# Patient Record
Sex: Female | Born: 1935
Health system: Southern US, Community
[De-identification: ages and names within clinical notes are randomized; demographics above are authoritative.]

## PROBLEM LIST (undated history)

## (undated) DIAGNOSIS — I10 Essential (primary) hypertension: Secondary | ICD-10-CM

## (undated) DIAGNOSIS — Z86010 Personal history of colonic polyps: Secondary | ICD-10-CM

## (undated) DIAGNOSIS — M1990 Unspecified osteoarthritis, unspecified site: Secondary | ICD-10-CM

## (undated) DIAGNOSIS — K5790 Diverticulosis of intestine, part unspecified, without perforation or abscess without bleeding: Secondary | ICD-10-CM

## (undated) DIAGNOSIS — Z8719 Personal history of other diseases of the digestive system: Secondary | ICD-10-CM

## (undated) DIAGNOSIS — M889 Osteitis deformans of unspecified bone: Secondary | ICD-10-CM

## (undated) DIAGNOSIS — F411 Generalized anxiety disorder: Secondary | ICD-10-CM

## (undated) DIAGNOSIS — C50411 Malignant neoplasm of upper-outer quadrant of right female breast: Secondary | ICD-10-CM

## (undated) DIAGNOSIS — R635 Abnormal weight gain: Secondary | ICD-10-CM

## (undated) HISTORY — PX: TOTAL HIP ARTHROPLASTY: SHX124

## (undated) HISTORY — DX: Osteitis deformans of unspecified bone: M88.9

## (undated) HISTORY — DX: Malignant neoplasm of upper-outer quadrant of right female breast: C50.411

## (undated) HISTORY — DX: Personal history of colonic polyps: Z86.010

## (undated) HISTORY — DX: Unspecified osteoarthritis, unspecified site: M19.90

## (undated) HISTORY — PX: TONSILLECTOMY: SHX5217

## (undated) HISTORY — DX: Essential (primary) hypertension: I10

## (undated) HISTORY — PX: TUBAL LIGATION: SHX77

## (undated) HISTORY — DX: Diverticulosis of intestine, part unspecified, without perforation or abscess without bleeding: K57.90

## (undated) HISTORY — PX: APPENDECTOMY: SHX54

## (undated) HISTORY — DX: Personal history of other diseases of the digestive system: Z87.19

## (undated) HISTORY — DX: Generalized anxiety disorder: F41.1

## (undated) HISTORY — DX: Abnormal weight gain: R63.5

---

## 1998-01-18 ENCOUNTER — Encounter: Payer: Self-pay | Admitting: *Deleted

## 1998-01-20 ENCOUNTER — Inpatient Hospital Stay (HOSPITAL_COMMUNITY): Admission: RE | Admit: 1998-01-20 | Discharge: 1998-01-26 | Payer: Self-pay | Admitting: *Deleted

## 1998-07-05 ENCOUNTER — Other Ambulatory Visit: Admission: RE | Admit: 1998-07-05 | Discharge: 1998-07-05 | Payer: Self-pay | Admitting: Obstetrics & Gynecology

## 1999-08-11 ENCOUNTER — Other Ambulatory Visit: Admission: RE | Admit: 1999-08-11 | Discharge: 1999-08-11 | Payer: Self-pay | Admitting: Internal Medicine

## 2000-10-24 ENCOUNTER — Other Ambulatory Visit: Admission: RE | Admit: 2000-10-24 | Discharge: 2000-10-24 | Payer: Self-pay | Admitting: Internal Medicine

## 2001-11-21 ENCOUNTER — Other Ambulatory Visit: Admission: RE | Admit: 2001-11-21 | Discharge: 2001-11-21 | Payer: Self-pay | Admitting: Internal Medicine

## 2002-09-10 ENCOUNTER — Encounter: Payer: Self-pay | Admitting: *Deleted

## 2002-09-10 ENCOUNTER — Encounter: Admission: RE | Admit: 2002-09-10 | Discharge: 2002-09-10 | Payer: Self-pay | Admitting: *Deleted

## 2002-11-23 ENCOUNTER — Other Ambulatory Visit: Admission: RE | Admit: 2002-11-23 | Discharge: 2002-11-23 | Payer: Self-pay | Admitting: Internal Medicine

## 2003-04-13 ENCOUNTER — Encounter: Admission: RE | Admit: 2003-04-13 | Discharge: 2003-04-13 | Payer: Self-pay | Admitting: *Deleted

## 2003-09-02 ENCOUNTER — Encounter: Admission: RE | Admit: 2003-09-02 | Discharge: 2003-09-02 | Payer: Self-pay | Admitting: Orthopedic Surgery

## 2004-02-04 ENCOUNTER — Encounter: Admission: RE | Admit: 2004-02-04 | Discharge: 2004-02-04 | Payer: Self-pay | Admitting: Orthopedic Surgery

## 2004-05-01 ENCOUNTER — Ambulatory Visit: Payer: Self-pay | Admitting: Internal Medicine

## 2004-06-01 ENCOUNTER — Encounter: Admission: RE | Admit: 2004-06-01 | Discharge: 2004-06-01 | Payer: Self-pay | Admitting: Orthopedic Surgery

## 2004-07-03 ENCOUNTER — Ambulatory Visit: Payer: Self-pay | Admitting: Internal Medicine

## 2004-08-15 ENCOUNTER — Inpatient Hospital Stay (HOSPITAL_COMMUNITY): Admission: RE | Admit: 2004-08-15 | Discharge: 2004-08-18 | Payer: Self-pay | Admitting: Orthopedic Surgery

## 2004-08-18 ENCOUNTER — Ambulatory Visit: Payer: Self-pay | Admitting: Physical Medicine & Rehabilitation

## 2004-08-18 ENCOUNTER — Inpatient Hospital Stay
Admission: RE | Admit: 2004-08-18 | Discharge: 2004-08-25 | Payer: Self-pay | Admitting: Physical Medicine & Rehabilitation

## 2004-09-25 ENCOUNTER — Ambulatory Visit: Payer: Self-pay | Admitting: Internal Medicine

## 2004-10-04 ENCOUNTER — Ambulatory Visit: Payer: Self-pay | Admitting: Internal Medicine

## 2005-03-26 ENCOUNTER — Other Ambulatory Visit: Admission: RE | Admit: 2005-03-26 | Discharge: 2005-03-26 | Payer: Self-pay | Admitting: Internal Medicine

## 2005-03-26 ENCOUNTER — Encounter: Payer: Self-pay | Admitting: Internal Medicine

## 2005-03-26 ENCOUNTER — Ambulatory Visit: Payer: Self-pay | Admitting: Internal Medicine

## 2005-09-26 ENCOUNTER — Ambulatory Visit: Payer: Self-pay | Admitting: Internal Medicine

## 2005-10-09 ENCOUNTER — Ambulatory Visit: Payer: Self-pay | Admitting: Internal Medicine

## 2005-10-17 ENCOUNTER — Encounter (INDEPENDENT_AMBULATORY_CARE_PROVIDER_SITE_OTHER): Payer: Self-pay | Admitting: Specialist

## 2005-10-17 ENCOUNTER — Encounter: Payer: Self-pay | Admitting: Internal Medicine

## 2005-10-17 ENCOUNTER — Ambulatory Visit: Payer: Self-pay | Admitting: Internal Medicine

## 2006-03-25 ENCOUNTER — Encounter: Admission: RE | Admit: 2006-03-25 | Discharge: 2006-03-25 | Payer: Self-pay | Admitting: Orthopedic Surgery

## 2006-04-01 ENCOUNTER — Encounter: Payer: Self-pay | Admitting: Internal Medicine

## 2006-04-02 ENCOUNTER — Ambulatory Visit: Payer: Self-pay | Admitting: Internal Medicine

## 2006-04-02 LAB — CONVERTED CEMR LAB
ALT: 16 units/L (ref 0–40)
Albumin: 3.7 g/dL (ref 3.5–5.2)
Alkaline Phosphatase: 151 units/L — ABNORMAL HIGH (ref 39–117)
BUN: 18 mg/dL (ref 6–23)
CO2: 32 meq/L (ref 19–32)
Calcium: 9.3 mg/dL (ref 8.4–10.5)
Chol/HDL Ratio, serum: 3.4
Cholesterol: 198 mg/dL (ref 0–200)
GFR calc non Af Amer: 87 mL/min
Glomerular Filtration Rate, Af Am: 105 mL/min/{1.73_m2}
HCT: 47.2 % — ABNORMAL HIGH (ref 36.0–46.0)
LDL Cholesterol: 118 mg/dL — ABNORMAL HIGH (ref 0–99)
Platelets: 266 10*3/uL (ref 150–400)
RDW: 12.6 % (ref 11.5–14.6)
Total Protein: 6.7 g/dL (ref 6.0–8.3)
Triglyceride fasting, serum: 109 mg/dL (ref 0–149)

## 2006-06-05 ENCOUNTER — Ambulatory Visit: Payer: Self-pay | Admitting: Internal Medicine

## 2006-10-03 ENCOUNTER — Ambulatory Visit: Payer: Self-pay | Admitting: Internal Medicine

## 2006-10-21 ENCOUNTER — Encounter: Payer: Self-pay | Admitting: Internal Medicine

## 2007-04-02 DIAGNOSIS — I10 Essential (primary) hypertension: Secondary | ICD-10-CM

## 2007-04-02 DIAGNOSIS — Z860100 Personal history of colon polyps, unspecified: Secondary | ICD-10-CM

## 2007-04-02 DIAGNOSIS — Z8719 Personal history of other diseases of the digestive system: Secondary | ICD-10-CM | POA: Insufficient documentation

## 2007-04-02 DIAGNOSIS — K5791 Diverticulosis of intestine, part unspecified, without perforation or abscess with bleeding: Secondary | ICD-10-CM | POA: Insufficient documentation

## 2007-04-02 HISTORY — DX: Personal history of other diseases of the digestive system: Z87.19

## 2007-04-02 HISTORY — DX: Essential (primary) hypertension: I10

## 2007-04-02 HISTORY — DX: Personal history of colon polyps, unspecified: Z86.0100

## 2007-04-02 HISTORY — DX: Personal history of colonic polyps: Z86.010

## 2007-04-07 ENCOUNTER — Ambulatory Visit: Payer: Self-pay | Admitting: Internal Medicine

## 2007-04-07 DIAGNOSIS — F411 Generalized anxiety disorder: Secondary | ICD-10-CM

## 2007-04-07 DIAGNOSIS — F419 Anxiety disorder, unspecified: Secondary | ICD-10-CM | POA: Insufficient documentation

## 2007-04-07 DIAGNOSIS — M1990 Unspecified osteoarthritis, unspecified site: Secondary | ICD-10-CM

## 2007-04-07 HISTORY — DX: Generalized anxiety disorder: F41.1

## 2007-04-07 HISTORY — DX: Unspecified osteoarthritis, unspecified site: M19.90

## 2007-04-07 LAB — CONVERTED CEMR LAB
ALT: 21 units/L (ref 0–35)
AST: 23 units/L (ref 0–37)
Alkaline Phosphatase: 146 units/L — ABNORMAL HIGH (ref 39–117)
BUN: 18 mg/dL (ref 6–23)
Basophils Relative: 0.6 % (ref 0.0–1.0)
Bilirubin, Direct: 0.1 mg/dL (ref 0.0–0.3)
CO2: 32 meq/L (ref 19–32)
Calcium: 10 mg/dL (ref 8.4–10.5)
Chloride: 105 meq/L (ref 96–112)
Cholesterol: 204 mg/dL (ref 0–200)
Eosinophils Relative: 0.8 % (ref 0.0–5.0)
GFR calc Af Amer: 105 mL/min
GFR calc non Af Amer: 87 mL/min
Glucose, Bld: 105 mg/dL — ABNORMAL HIGH (ref 70–99)
HDL: 42.9 mg/dL (ref >39.0)
Monocytes Relative: 9.8 % (ref 3.0–11.0)
Platelets: 180 10*3/uL (ref 150–400)
RBC: 5.43 M/uL — ABNORMAL HIGH (ref 3.87–5.11)
Total CHOL/HDL Ratio: 4.8
Total Protein: 7.2 g/dL (ref 6.0–8.3)
Triglycerides: 142 mg/dL (ref 0–149)
VLDL: 28 mg/dL (ref 0–40)
WBC: 8.8 10*3/uL (ref 4.5–10.5)

## 2007-04-09 ENCOUNTER — Ambulatory Visit: Payer: Self-pay | Admitting: Cardiology

## 2007-05-06 ENCOUNTER — Ambulatory Visit: Payer: Self-pay | Admitting: Internal Medicine

## 2007-07-22 ENCOUNTER — Ambulatory Visit: Payer: Self-pay | Admitting: Internal Medicine

## 2007-07-22 DIAGNOSIS — J069 Acute upper respiratory infection, unspecified: Secondary | ICD-10-CM | POA: Insufficient documentation

## 2007-11-13 ENCOUNTER — Ambulatory Visit: Payer: Self-pay | Admitting: Internal Medicine

## 2007-11-13 DIAGNOSIS — R635 Abnormal weight gain: Secondary | ICD-10-CM | POA: Insufficient documentation

## 2007-11-13 HISTORY — DX: Abnormal weight gain: R63.5

## 2008-01-05 ENCOUNTER — Ambulatory Visit: Payer: Self-pay | Admitting: Internal Medicine

## 2008-01-05 DIAGNOSIS — J189 Pneumonia, unspecified organism: Secondary | ICD-10-CM | POA: Insufficient documentation

## 2008-02-25 ENCOUNTER — Ambulatory Visit: Payer: Self-pay | Admitting: Internal Medicine

## 2008-04-23 ENCOUNTER — Telehealth: Payer: Self-pay | Admitting: Internal Medicine

## 2008-05-04 ENCOUNTER — Encounter: Payer: Self-pay | Admitting: Internal Medicine

## 2008-05-05 ENCOUNTER — Ambulatory Visit: Payer: Self-pay | Admitting: Internal Medicine

## 2008-05-05 LAB — CONVERTED CEMR LAB
ALT: 24 units/L (ref 0–35)
AST: 25 units/L (ref 0–37)
Albumin: 3.8 g/dL (ref 3.5–5.2)
Alkaline Phosphatase: 141 units/L — ABNORMAL HIGH (ref 39–117)
BUN: 15 mg/dL (ref 6–23)
Bilirubin, Direct: 0.1 mg/dL (ref 0.0–0.3)
CO2: 33 meq/L — ABNORMAL HIGH (ref 19–32)
Chloride: 108 meq/L (ref 96–112)
Cholesterol: 219 mg/dL (ref 0–200)
Eosinophils Relative: 0.7 % (ref 0.0–5.0)
Glucose, Bld: 86 mg/dL (ref 70–99)
HCT: 44.7 % (ref 36.0–46.0)
Lymphocytes Relative: 30.1 % (ref 12.0–46.0)
Monocytes Relative: 5.7 % (ref 3.0–12.0)
Platelets: 253 10*3/uL (ref 150–400)
Potassium: 5.1 meq/L (ref 3.5–5.1)
RDW: 12.8 % (ref 11.5–14.6)
Sodium: 146 meq/L — ABNORMAL HIGH (ref 135–145)
Total CHOL/HDL Ratio: 4.5
Total Protein: 6.9 g/dL (ref 6.0–8.3)
Triglycerides: 124 mg/dL (ref 0–149)
VLDL: 25 mg/dL (ref 0–40)
WBC: 10.6 10*3/uL — ABNORMAL HIGH (ref 4.5–10.5)

## 2008-09-29 ENCOUNTER — Encounter (INDEPENDENT_AMBULATORY_CARE_PROVIDER_SITE_OTHER): Payer: Self-pay | Admitting: *Deleted

## 2008-10-13 ENCOUNTER — Ambulatory Visit: Payer: Self-pay | Admitting: Internal Medicine

## 2008-10-28 ENCOUNTER — Ambulatory Visit: Payer: Self-pay | Admitting: Internal Medicine

## 2008-10-28 ENCOUNTER — Encounter: Payer: Self-pay | Admitting: Internal Medicine

## 2008-10-30 ENCOUNTER — Encounter: Payer: Self-pay | Admitting: Internal Medicine

## 2008-11-03 ENCOUNTER — Ambulatory Visit: Payer: Self-pay | Admitting: Internal Medicine

## 2008-11-22 ENCOUNTER — Encounter: Payer: Self-pay | Admitting: Internal Medicine

## 2009-05-05 ENCOUNTER — Ambulatory Visit: Payer: Self-pay | Admitting: Internal Medicine

## 2009-05-05 LAB — CONVERTED CEMR LAB
ALT: 19 units/L (ref 0–35)
Albumin: 4 g/dL (ref 3.5–5.2)
Alkaline Phosphatase: 139 units/L — ABNORMAL HIGH (ref 39–117)
Basophils Relative: 0.4 % (ref 0.0–3.0)
Bilirubin, Direct: 0.1 mg/dL (ref 0.0–0.3)
Chloride: 107 meq/L (ref 96–112)
Cholesterol: 223 mg/dL — ABNORMAL HIGH (ref 0–200)
Creatinine, Ser: 0.7 mg/dL (ref 0.4–1.2)
Eosinophils Relative: 1.5 % (ref 0.0–5.0)
Hemoglobin: 15.5 g/dL — ABNORMAL HIGH (ref 12.0–15.0)
Lymphocytes Relative: 35.6 % (ref 12.0–46.0)
MCV: 93.9 fL (ref 78.0–100.0)
Monocytes Absolute: 0.7 10*3/uL (ref 0.1–1.0)
Neutro Abs: 5.1 10*3/uL (ref 1.4–7.7)
Neutrophils Relative %: 54.8 % (ref 43.0–77.0)
RBC: 4.9 M/uL (ref 3.87–5.11)
Sodium: 146 meq/L — ABNORMAL HIGH (ref 135–145)
Total CHOL/HDL Ratio: 5
Total Protein: 7.2 g/dL (ref 6.0–8.3)
VLDL: 28.8 mg/dL (ref 0.0–40.0)
WBC: 9.2 10*3/uL (ref 4.5–10.5)

## 2009-08-16 ENCOUNTER — Ambulatory Visit: Payer: Self-pay | Admitting: Internal Medicine

## 2009-11-03 ENCOUNTER — Ambulatory Visit: Payer: Self-pay | Admitting: Internal Medicine

## 2009-11-23 ENCOUNTER — Encounter: Payer: Self-pay | Admitting: Internal Medicine

## 2010-02-08 ENCOUNTER — Ambulatory Visit: Payer: Self-pay | Admitting: Family Medicine

## 2010-05-18 ENCOUNTER — Ambulatory Visit: Payer: Self-pay | Admitting: Internal Medicine

## 2010-06-07 ENCOUNTER — Encounter: Payer: Self-pay | Admitting: Internal Medicine

## 2010-06-07 ENCOUNTER — Ambulatory Visit
Admission: RE | Admit: 2010-06-07 | Discharge: 2010-06-07 | Payer: Self-pay | Source: Home / Self Care | Attending: Internal Medicine | Admitting: Internal Medicine

## 2010-06-07 ENCOUNTER — Other Ambulatory Visit: Payer: Self-pay | Admitting: Internal Medicine

## 2010-06-07 LAB — CBC WITH DIFFERENTIAL/PLATELET
Basophils Absolute: 0 10*3/uL (ref 0.0–0.1)
Basophils Relative: 0.2 % (ref 0.0–3.0)
Eosinophils Absolute: 0 10*3/uL (ref 0.0–0.7)
Eosinophils Relative: 0.5 % (ref 0.0–5.0)
HCT: 45 % (ref 36.0–46.0)
Hemoglobin: 15.4 g/dL — ABNORMAL HIGH (ref 12.0–15.0)
Lymphocytes Relative: 32.1 % (ref 12.0–46.0)
Lymphs Abs: 3.2 10*3/uL (ref 0.7–4.0)
MCHC: 34.1 g/dL (ref 30.0–36.0)
MCV: 92.2 fl (ref 78.0–100.0)
Monocytes Absolute: 0.7 10*3/uL (ref 0.1–1.0)
Monocytes Relative: 6.8 % (ref 3.0–12.0)
Neutro Abs: 6 10*3/uL (ref 1.4–7.7)
Neutrophils Relative %: 60.4 % (ref 43.0–77.0)
Platelets: 253 10*3/uL (ref 150.0–400.0)
RBC: 4.89 Mil/uL (ref 3.87–5.11)
RDW: 13.6 % (ref 11.5–14.6)
WBC: 10 10*3/uL (ref 4.5–10.5)

## 2010-06-07 LAB — BASIC METABOLIC PANEL
BUN: 17 mg/dL (ref 6–23)
CO2: 31 mEq/L (ref 19–32)
Calcium: 9.5 mg/dL (ref 8.4–10.5)
Chloride: 104 mEq/L (ref 96–112)
Creatinine, Ser: 0.7 mg/dL (ref 0.4–1.2)
GFR: 91.87 mL/min (ref >60.00)
Glucose, Bld: 94 mg/dL (ref 70–99)
Potassium: 4.2 mEq/L (ref 3.5–5.1)
Sodium: 142 mEq/L (ref 135–145)

## 2010-06-07 LAB — LIPID PANEL
Cholesterol: 195 mg/dL (ref 0–200)
HDL: 42.9 mg/dL (ref >39.00)
LDL Cholesterol: 129 mg/dL — ABNORMAL HIGH (ref 0–99)
Total CHOL/HDL Ratio: 5
Triglycerides: 116 mg/dL (ref 0.0–149.0)
VLDL: 23.2 mg/dL (ref 0.0–40.0)

## 2010-06-07 LAB — HEPATIC FUNCTION PANEL
ALT: 15 U/L (ref 0–35)
AST: 20 U/L (ref 0–37)
Albumin: 3.9 g/dL (ref 3.5–5.2)
Alkaline Phosphatase: 144 U/L — ABNORMAL HIGH (ref 39–117)
Bilirubin, Direct: 0.1 mg/dL (ref 0.0–0.3)
Total Bilirubin: 0.9 mg/dL (ref 0.3–1.2)
Total Protein: 6.9 g/dL (ref 6.0–8.3)

## 2010-06-07 LAB — TSH: TSH: 1.48 u[IU]/mL (ref 0.35–5.50)

## 2010-07-04 NOTE — Assessment & Plan Note (Signed)
Summary: congestion//ccm   Vital Signs:  Patient profile:   77 year old female Weight:      163 pounds Temp:     97.9 degrees F oral BP sitting:   130 / 60  (left arm) Cuff size:   regular  Vitals Entered By: Kathrynn Speed CMA (February 08, 2010 1:47 PM) CC: dry cough, coughing up mucus, chest congestion, stopped up nose, sore throat, x 5 days, src Is Patient Diabetic? No   History of Present Illness: "Sunday visit. Patient seen with the onset of cough and some mild wheezing about 4 days ago. She's had some nasal congestion, intermittent sore throat and cough productive of yellow-green sputum. No fever. Has been treated twice previously for pneumonia. Ex-smoker. No hemoptysis and no dyspnea.  Preventive Screening-Counseling & Management  Alcohol-Tobacco     Smoking Status: quit  Current Medications (verified): 1)  Hydrochlorothiazide 25 Mg Tabs (Hydrochlorothiazide) .... 1 Once Daily 2)  Zoloft 50 Mg  Tabs (Sertraline Hcl) .... 1 Once Daily  Allergies (verified): No Known Drug Allergies  Past History:  Past Medical History: Last updated: 11/13/2007 DJD Colonic polyps, hx of Diverticulitis, hx of Hypertension Anxiety Paget's disease, lumbar spine and sacrum PMH reviewed for relevance  Review of Systems  The patient denies anorexia, fever, weight loss, chest pain, dyspnea on exertion, peripheral edema, prolonged cough, and hemoptysis.    Physical Exam  General:  Well-developed,well-nourished,in no acute distress; alert,appropriate and cooperative throughout examination Ears:  scarring of her right TM. No acute change Nose:  External nasal examination shows no deformity or inflammation. Nasal mucosa are pink and moist without lesions or exudates. Mouth:  Oral mucosa and oropharynx without lesions or exudates.  Teeth in good repair. Neck:  No deformities, masses, or tenderness noted. Lungs:  chest some very faint expiratory wheezes. No rales Heart:  normal rate and  regular rhythm.     Impression & Recommendations:  Problem # 1:  ACUTE BRONCHITIS (ICD-466.0)  Her updated medication list for this problem includes:    Azithromycin 250 Mg Tabs (Azithromycin) ..... 2 by mouth today then one by mouth once daily for 4 days  Orders: Prescription Created Electronically (G8553)  Complete Medication List: 1)  Hydrochlorothiazide 25 Mg Tabs (Hydrochlorothiazide) .... 1 once daily 2)  Zoloft 50 Mg Tabs (Sertraline hcl) .... 1 once daily 3)  Multi Vitamins Tabs (multiple Vitamins-minerals)  .... Once daily 4)  Azithromycin 250 Mg Tabs (Azithromycin) .... 2 by mouth today then one by mouth once daily for 4 days  Patient Instructions: 1)  Try Mucinex DM as needed cough. Prescriptions: AZITHROMYCIN 250 MG TABS (AZITHROMYCIN) 2 by mouth today then one by mouth once daily for 4 days  #6 x 0   Entered and Authorized by:   Bruce Burchette MD   Signed by:   Bruce Burchette MD on 02/08/2010   Method used:   [facility]        [facility]  [address] [id number]       [address]       [place], [place]  [id number]       Ph: [phone] or [phone]       Fax: [phone]   RxID:   [id number]   Appended Document: Orders Update     Clinical Lists Changes  Observations: Added new observation of FLU VAX: Historical (03/25/2010 11:27)       Immunization History:  Influenza Immunization History:  Influenza:  historical (03/25/2010)

## 2010-07-04 NOTE — Assessment & Plan Note (Signed)
Summary: chest congestion//ccm   Vital Signs:  Patient profile:   77 year old female Weight:      163 pounds O2 Sat:      94 % on Room air Temp:     98.0 degrees F oral BP sitting:   132 / 62  (left arm) Cuff size:   regular  Vitals Entered By: Duard Brady LPN (August 16, 2009 11:10 AM)  O2 Flow:  Room air CC: c/o productive cough x10 days  OTC no relief Is Patient Diabetic? No   CC:  c/o productive cough x10 days  OTC no relief.  History of Present Illness:  77 year old patient who is seen today with a 10 day history of chest congestion and cough.  For the past few days.  She has been expectorating green, thick sputum.  Denies any chest pain or shortness of breath.  Denies any fever or chills.  She does have treated hypertension.  Preventive Screening-Counseling & Management  Alcohol-Tobacco     Smoking Status: quit  Allergies (verified): No Known Drug Allergies  Past History:  Past Medical History: Reviewed history from 11/13/2007 and no changes required. DJD Colonic polyps, hx of Diverticulitis, hx of Hypertension Anxiety Paget's disease, lumbar spine and sacrum  Social History: Smoking Status:  quit  Review of Systems       The patient complains of anorexia and prolonged cough.  The patient denies fever, weight loss, weight gain, vision loss, decreased hearing, hoarseness, chest pain, syncope, dyspnea on exertion, peripheral edema, headaches, hemoptysis, abdominal pain, melena, hematochezia, severe indigestion/heartburn, hematuria, incontinence, genital sores, muscle weakness, suspicious skin lesions, transient blindness, difficulty walking, depression, unusual weight change, abnormal bleeding, enlarged lymph nodes, angioedema, and breast masses.    Physical Exam  General:  overweight-appearing.  130/64overweight-appearing.   Head:  Normocephalic and atraumatic without obvious abnormalities. No apparent alopecia or balding. Eyes:  No corneal or  conjunctival inflammation noted. EOMI. Perrla. Funduscopic exam benign, without hemorrhages, exudates or papilledema. Vision grossly normal. Ears:  External ear exam shows no significant lesions or deformities.  Otoscopic examination reveals clear canals, tympanic membranes are intact bilaterally without bulging, retraction, inflammation or discharge. Hearing is grossly normal bilaterally. Mouth:  Oral mucosa and oropharynx without lesions or exudates.  Teeth in good repair. Neck:  No deformities, masses, or tenderness noted. Lungs:  Normal respiratory effort, chest expands symmetrically. Lungs are clear to auscultation, no crackles or wheezes. O2 saturation 95 Heart:  Normal rate and regular rhythm. S1 and S2 normal without gallop, murmur, click, rub or other extra sounds. Abdomen:  Bowel sounds positive,abdomen soft and non-tender without masses, organomegaly or hernias noted. Msk:  No deformity or scoliosis noted of thoracic or lumbar spine.   Pulses:  R and L carotid,radial,femoral,dorsalis pedis and posterior tibial pulses are full and equal bilaterally Extremities:  No clubbing, cyanosis, edema, or deformity noted with normal full range of motion of all joints.     Impression & Recommendations:  Problem # 1:  BRONCHITIS-ACUTE (ICD-466.0)  Her updated medication list for this problem includes:    Doxycycline Hyclate 100 Mg Caps (Doxycycline hyclate) ..... One twice daily    Hydrocodone-homatropine 5-1.5 Mg/76ml Syrp (Hydrocodone-homatropine) .Marland Kitchen... 1 teaspoon every 6 hours as needed for cough  Her updated medication list for this problem includes:    Doxycycline Hyclate 100 Mg Caps (Doxycycline hyclate) ..... One twice daily    Hydrocodone-homatropine 5-1.5 Mg/38ml Syrp (Hydrocodone-homatropine) .Marland Kitchen... 1 teaspoon every 6 hours as needed for cough  Problem #  2:  HYPERTENSION (ICD-401.9)  Her updated medication list for this problem includes:    Hydrochlorothiazide 25 Mg Tabs  (Hydrochlorothiazide) .Marland Kitchen... 1 once daily  Her updated medication list for this problem includes:    Hydrochlorothiazide 25 Mg Tabs (Hydrochlorothiazide) .Marland Kitchen... 1 once daily  Complete Medication List: 1)  Hydrochlorothiazide 25 Mg Tabs (Hydrochlorothiazide) .Marland Kitchen.. 1 once daily 2)  Zoloft 50 Mg Tabs (Sertraline hcl) .Marland Kitchen.. 1 once daily 3)  Doxycycline Hyclate 100 Mg Caps (Doxycycline hyclate) .... One twice daily 4)  Hydrocodone-homatropine 5-1.5 Mg/53ml Syrp (Hydrocodone-homatropine) .Marland Kitchen.. 1 teaspoon every 6 hours as needed for cough  Patient Instructions: 1)  Please schedule a follow-up appointment in 3 months. 2)  Limit your Sodium (Salt). 3)  It is important that you exercise regularly at least 20 minutes 5 times a week. If you develop chest pain, have severe difficulty breathing, or feel very tired , stop exercising immediately and seek medical attention. 4)  You need to lose weight. Consider a lower calorie diet and regular exercise.  Prescriptions: HYDROCODONE-HOMATROPINE 5-1.5 MG/5ML SYRP (HYDROCODONE-HOMATROPINE) 1 teaspoon every 6 hours as needed for cough  #4 oz x 0   Entered and Authorized by:   Gordy Savers  MD   Signed by:   Gordy Savers  MD on 08/16/2009   Method used:   Printed then faxed to ...       [facility]  [address] [phone]ail[facility]       [address]       [place], [place]  [address]       Ph: [phone] or [phone]       Fax: [phone]   RxID:   [id number] DOXYCYCLINE HYCLATE 100 MG CAPS (DOXYCYCLINE HYCLATE) one twice daily  #14 x 0   Entered and Authorized by:   Gordy Savers  MD   Signed by:   Gordy Savers  MD on 08/16/2009   Method used:   Printed then faxed to ...       [facility]  [address] [phone] ([facility]ail[facility]       [address]       [place], [place]  [address]       Ph: [phone] or [phone]       Fax: [phone]   RxID:   [id number]

## 2010-07-04 NOTE — Assessment & Plan Note (Signed)
Summary: 6 month rov/njr   Vital Signs:  Patient profile:   77 year old female Weight:      159 pounds Temp:     98.4 degrees F oral BP sitting:   136 / 70  (right arm) Cuff size:   regular  Vitals Entered By: Duard Brady LPN (November 03, 1608 10:41 AM) CC: 6 mos rov - doing well - still c/o (R) knee pain from fall several mos ago. , Hypertension Management Is Patient Diabetic? No   CC:  6 mos rov - doing well - still c/o (R) knee pain from fall several mos ago.  and Hypertension Management.  History of Present Illness: 77 year old patient who has a history of hypertension, osteoarthritis.  She is seen today for 6 month follow-up.  She was seen 3 months ago for a URI.  That was somewhat stubborn, but has only improved.  No new concerns or complaints today except for some persistent right knee pain after a fall.  Hypertension History:      Positive major cardiovascular risk factors include female age 75 years old or older and hypertension.  Negative major cardiovascular risk factors include non-tobacco-user status.     Preventive Screening-Counseling & Management  Alcohol-Tobacco     Smoking Status: quit  Allergies (verified): No Known Drug Allergies  Past History:  Past Medical History: Reviewed history from 11/13/2007 and no changes required. DJD Colonic polyps, hx of Diverticulitis, hx of Hypertension Anxiety Paget's disease, lumbar spine and sacrum  Past Surgical History: Reviewed history from 11/03/2008 and no changes required. Appendectomy Total hip replacement left 1989, with revision in 1999; status post right total hip replacement surgery in March 2006 Tubal ligation Tonsillectomy  colonoscopy May 2007, May 2010  Review of Systems  The patient denies anorexia, fever, weight loss, weight gain, vision loss, decreased hearing, hoarseness, chest pain, syncope, dyspnea on exertion, peripheral edema, prolonged cough, headaches, hemoptysis, abdominal pain,  melena, hematochezia, severe indigestion/heartburn, hematuria, incontinence, genital sores, muscle weakness, suspicious skin lesions, transient blindness, difficulty walking, depression, unusual weight change, abnormal bleeding, enlarged lymph nodes, angioedema, and breast masses.    Physical Exam  General:  Well-developed,well-nourished,in no acute distress; alert,appropriate and cooperative throughout examination Head:  Normocephalic and atraumatic without obvious abnormalities. No apparent alopecia or balding. Eyes:  No corneal or conjunctival inflammation noted. EOMI. Perrla. Funduscopic exam benign, without hemorrhages, exudates or papilledema. Vision grossly normal. Mouth:  Oral mucosa and oropharynx without lesions or exudates.  Teeth in good repair. Neck:  No deformities, masses, or tenderness noted. Lungs:  Normal respiratory effort, chest expands symmetrically. Lungs are clear to auscultation, no crackles or wheezes. Heart:  Normal rate and regular rhythm. S1 and S2 normal without gallop, murmur, click, rub or other extra sounds. Abdomen:  Bowel sounds positive,abdomen soft and non-tender without masses, organomegaly or hernias noted. Msk:  No deformity or scoliosis noted of thoracic or lumbar spine.   Pulses:  R and L carotid,radial,femoral,dorsalis pedis and posterior tibial pulses are full and equal bilaterally Extremities:  No clubbing, cyanosis, edema, or deformity noted with normal full range of motion of all joints.     Impression & Recommendations:  Problem # 1:  OSTEOARTHRITIS, SEVERE (ICD-715.90)  Problem # 2:  HYPERTENSION (ICD-401.9)  Her updated medication list for this problem includes:    Hydrochlorothiazide 25 Mg Tabs (Hydrochlorothiazide) .Marland Kitchen... 1 once daily  Complete Medication List: 1)  Hydrochlorothiazide 25 Mg Tabs (Hydrochlorothiazide) .Marland Kitchen.. 1 once daily 2)  Zoloft 50 Mg Tabs (  Sertraline hcl) .Marland Kitchen.. 1 once daily  Hypertension Assessment/Plan:      The  patient's hypertensive risk group is category B: At least one risk factor (excluding diabetes) with no target organ damage.  Today's blood pressure is 136/70.    Patient Instructions: 1)  Please schedule a follow-up appointment in 6 months. 2)  Advised not to eat any food or drink any liquids after 10 PM the night before your procedure. 3)  Limit your Sodium (Salt) to less than 2 grams a day(slightly less than 1/2 a teaspoon) to prevent fluid retention, swelling, or worsening of symptoms. 4)  It is important that you exercise regularly at least 20 minutes 5 times a week. If you develop chest pain, have severe difficulty breathing, or feel very tired , stop exercising immediately and seek medical attention.

## 2010-07-06 NOTE — Assessment & Plan Note (Signed)
Summary: cpx /pt will be fasting/mm   Vital Signs:  Patient profile:   77 year old female Height:      63.5 inches Weight:      159 pounds BMI:     27.82 Temp:     98.3 degrees F oral BP sitting:   110 / 74  (left arm) Cuff size:   regular  Vitals Entered By: Duard Brady LPN (June 07, 2010 8:40 AM) CC: cpx - doing well Is Patient Diabetic? No   CC:  cpx - doing well.  History of Present Illness: 33 -year-old patient who is seen today for a preventive health examination.  Medical problems include treated hypertension, as well as osteoarthritis.  She is followed closely by GI to do a personal history of colonic polyps and a family history of colon cancer.  She is doing well today.  Here for Medicare AWV:  1.   Risk factors based on Past M, S, F history:  cardiac risk factors include hypertension only 2.   Physical Activities: remains fairly active, although limited somewhat by osteoarthritis 3.   Depression/mood-  history of mild depression, which has been quite stable on Zoloft 4.   Hearing: no hearing deficits 5.   ADL's: independent in all aspects of daily living 6.   Fall Risk: low 7.   Home Safety: no problems identified 8.   Height, weight, &visual acuity:height and weight stable.  No change in visual acuity 9.   Counseling: heart healthy diet regular.  Exercise encouraged 10.   Labs ordered based on risk factors: laboratory profile, including lipid panel will be reviewed 11.           Referral Coordination- none  appropriate at this time; will be followed colonoscopy Spring 2013 12.           Care Plan- heart healthy diet modest weight loss encouraged;  calcium supplementation encouraged 13.            Cognitive Assessment- alert and oriented, with normal affect.  No cognitive dysfunction, handles all executive functions without difficulty   Allergies (verified): No Known Drug Allergies  Past History:  Past Medical History: Reviewed history from 11/13/2007  and no changes required. DJD Colonic polyps, hx of Diverticulitis, hx of Hypertension Anxiety Paget's disease, lumbar spine and sacrum  Past Surgical History: Reviewed history from 11/03/2008 and no changes required. Appendectomy Total hip replacement left 1989, with revision in 1999; status post right total hip replacement surgery in March 2006 Tubal ligation Tonsillectomy  colonoscopy May 2007, May 2010  Family History: Reviewed history from 05/05/2008 and no changes required. Family History of Colon CA 1st degree relative <60 Family History Lung cancer Family History of Cardiovascular disorder Family History of Leukemia Family History of Hodgkins  father  died of Hodgkin's diseasedisease and leukemia at age 4 mother died age 56, pancreatic cancer  Three brothers, one deceased of colon cancer.  Another from lung cancer third brother died   with pancreatic cancer  Social History: Reviewed history from 05/05/2009 and no changes required. Occupation:   Married  Review of Systems  The patient denies anorexia, fever, weight loss, weight gain, vision loss, decreased hearing, hoarseness, chest pain, syncope, dyspnea on exertion, peripheral edema, prolonged cough, headaches, hemoptysis, abdominal pain, melena, hematochezia, severe indigestion/heartburn, hematuria, incontinence, genital sores, muscle weakness, suspicious skin lesions, transient blindness, difficulty walking, depression, unusual weight change, abnormal bleeding, enlarged lymph nodes, angioedema, and breast masses.    Physical Exam  General:  overweight-appearing.  normal blood pressureoverweight-appearing.   Head:  Normocephalic and atraumatic without obvious abnormalities. No apparent alopecia or balding. Eyes:  No corneal or conjunctival inflammation noted. EOMI. Perrla. Funduscopic exam benign, without hemorrhages, exudates or papilledema. Vision grossly normal. Ears:  External ear exam shows no significant  lesions or deformities.  Otoscopic examination reveals clear canals, tympanic membranes are intact bilaterally without bulging, retraction, inflammation or discharge. Hearing is grossly normal bilaterally. Nose:  External nasal examination shows no deformity or inflammation. Nasal mucosa are pink and moist without lesions or exudates. Mouth:  Oral mucosa and oropharynx without lesions or exudates.  Teeth in good repair. Neck:  No deformities, masses, or tenderness noted. Chest Wall:  No deformities, masses, or tenderness noted. Breasts:  No mass, nodules, thickening, tenderness, bulging, retraction, inflamation, nipple discharge or skin changes noted.   Lungs:  Normal respiratory effort, chest expands symmetrically. Lungs are clear to auscultation, no crackles or wheezes. Heart:  Normal rate and regular rhythm. S1 and S2 normal without gallop, murmur, click, rub or other extra sounds. Abdomen:  Bowel sounds positive,abdomen soft and non-tender without masses, organomegaly or hernias noted. Genitalia:  Normal introitus for age, no external lesions, no vaginal discharge, mucosa pink and moist, no vaginal or cervical lesions, no vaginal atrophy, no friaility or hemorrhage, normal uterus size and position, no adnexal masses or tenderness Msk:  No deformity or scoliosis noted of thoracic or lumbar spine.   Pulses:  R and L carotid,radial,femoral,dorsalis pedis and posterior tibial pulses are full and equal bilaterally Extremities:  No clubbing, cyanosis, edema, or deformity noted with normal full range of motion of all joints.   Neurologic:  No cranial nerve deficits noted. Station and gait are normal. Plantar reflexes are down-going bilaterally. DTRs are symmetrical throughout. Sensory, motor and coordinative functions appear intact. Skin:  Intact without suspicious lesions or rashes Cervical Nodes:  No lymphadenopathy noted Axillary Nodes:  No palpable lymphadenopathy Inguinal Nodes:  No significant  adenopathy Psych:  Cognition and judgment appear intact. Alert and cooperative with normal attention span and concentration. No apparent delusions, illusions, hallucinations   Impression & Recommendations:  Problem # 1:  HEALTH SCREENING (ICD-V70.0)  Orders: Medicare -1st Annual Wellness Visit [phone]) Venipuncture (60454) TLB-Lipid Panel (80061-LIPID) TLB-BMP (Basic Metabolic Panel-BMET) (80048-METABOL) TLB-CBC Platelet - w/Differential (85025-CBCD) TLB-Hepatic/Liver Function Pnl (80076-HEPATIC) TLB-TSH (Thyroid Stimulating Hormone) (84443-TSH) Specimen Handling (09811)  Problem # 2:  OSTEOARTHRITIS, SEVERE (ICD-715.90)  Orders: Venipuncture (91478) TLB-Lipid Panel (80061-LIPID) TLB-BMP (Basic Metabolic Panel-BMET) (80048-METABOL) TLB-CBC Platelet - w/Differential (85025-CBCD) TLB-Hepatic/Liver Function Pnl (80076-HEPATIC) TLB-TSH (Thyroid Stimulating Hormone) (84443-TSH)  Problem # 3:  HYPERTENSION (ICD-401.9)  Her updated medication list for this problem includes:    Hydrochlorothiazide 25 Mg Tabs (Hydrochlorothiazide) .Marland Kitchen... 1 once daily  Orders: Venipuncture (29562) TLB-Lipid Panel (80061-LIPID) TLB-BMP (Basic Metabolic Panel-BMET) (80048-METABOL) TLB-CBC Platelet - w/Differential (85025-CBCD) TLB-Hepatic/Liver Function Pnl (80076-HEPATIC) TLB-TSH (Thyroid Stimulating Hormone) (84443-TSH)  Problem # 4:  COLONIC POLYPS, HX OF (ICD-V12.72)  Orders: Venipuncture (13086) TLB-Lipid Panel (80061-LIPID) TLB-BMP (Basic Metabolic Panel-BMET) (80048-METABOL) TLB-CBC Platelet - w/Differential (85025-CBCD) TLB-Hepatic/Liver Function Pnl (80076-HEPATIC) TLB-TSH (Thyroid Stimulating Hormone) (84443-TSH)  Complete Medication List: 1)  Hydrochlorothiazide 25 Mg Tabs (Hydrochlorothiazide) .Marland Kitchen.. 1 once daily 2)  Zoloft 50 Mg Tabs (Sertraline hcl) .Marland Kitchen.. 1 once daily 3)  Multi Vitamins Tabs (multiple Vitamins-minerals)  .... Once daily  Other Orders: EKG w/ Interpretation  (93000)  Patient Instructions: 1)  Please schedule a follow-up appointment in 6 months. 2)  Limit your Sodium (Salt) to  less than 2 grams a day(slightly less than 1/2 a teaspoon) to prevent fluid retention, swelling, or worsening of symptoms. 3)  It is important that you exercise regularly at least 20 minutes 5 times a week. If you develop chest pain, have severe difficulty breathing, or feel very tired , stop exercising immediately and seek medical attention. 4)  Take calcium +Vitamin D daily.   Orders Added: 1)  EKG w/ Interpretation [93000] 2)  Medicare -1st Annual Wellness Visit [G0438] 3)  Est. Patient Level III [84132] 4)  Venipuncture [36415] 5)  TLB-Lipid Panel [80061-LIPID] 6)  TLB-BMP (Basic Metabolic Panel-BMET) [80048-METABOL] 7)  TLB-CBC Platelet - w/Differential [85025-CBCD] 8)  TLB-Hepatic/Liver Function Pnl [80076-HEPATIC] 9)  TLB-TSH (Thyroid Stimulating Hormone) [84443-TSH] 10)  Specimen Handling [99000]

## 2010-07-06 NOTE — Assessment & Plan Note (Signed)
Summary: ?BRONCHITUS/CJR   Vital Signs:  Patient profile:   77 year old female Weight:      160 pounds O2 Sat:      95 % on Room air Temp:     98.2 degrees F oral BP sitting:   140 / 80  (right arm) Cuff size:   regular  Vitals Entered By: Duard Brady LPN (May 18, 2010 10:49 AM)  O2 Flow:  Room air CC: cough, chest congestion  Is Patient Diabetic? No   CC:  cough and chest congestion .  History of Present Illness:   77 year old patient who presents with a two-day history of chest congestion and cough.  She has been expectorating a small amount of yellow sputum.  There is been no fever or chills.  She does have a prior history of a right lower lobe pneumonia.  Denies any chest pain or shortness of breath. She has treated hypertension, which has been well controlled on diuretic therapy.  She has a history of anxiety, depression, also, well controlled there is a history of mild obesity.  She is requesting a refill on her diet pills, which have been used sparingly in the past.  Allergies (verified): No Known Drug Allergies  Past History:  Past Medical History: Reviewed history from 11/13/2007 and no changes required. DJD Colonic polyps, hx of Diverticulitis, hx of Hypertension Anxiety Paget's disease, lumbar spine and sacrum  Past Surgical History: Reviewed history from 11/03/2008 and no changes required. Appendectomy Total hip replacement left 1989, with revision in 1999; status post right total hip replacement surgery in March 2006 Tubal ligation Tonsillectomy  colonoscopy May 2007, May 2010  Review of Systems       The patient complains of anorexia, fever, hoarseness, and prolonged cough.  The patient denies weight loss, weight gain, vision loss, decreased hearing, chest pain, syncope, dyspnea on exertion, peripheral edema, headaches, hemoptysis, abdominal pain, melena, hematochezia, severe indigestion/heartburn, hematuria, incontinence, genital sores,  muscle weakness, suspicious skin lesions, transient blindness, difficulty walking, depression, unusual weight change, abnormal bleeding, enlarged lymph nodes, angioedema, and breast masses.     Impression & Recommendations:  Problem # 1:  URI (ICD-465.9)  Her updated medication list for this problem includes:    Hydrocodone-homatropine 5-1.5 Mg/19ml Syrp (Hydrocodone-homatropine) ..... One half to 1 teaspoon every 6 hours as needed for cough  Her updated medication list for this problem includes:    Hydrocodone-homatropine 5-1.5 Mg/61ml Syrp (Hydrocodone-homatropine) ..... One half to 1 teaspoon every 6 hours as needed for cough  Problem # 2:  OSTEOARTHRITIS, SEVERE (ICD-715.90)  Problem # 3:  HYPERTENSION (ICD-401.9)  Her updated medication list for this problem includes:    Hydrochlorothiazide 25 Mg Tabs (Hydrochlorothiazide) .Marland Kitchen... 1 once daily  Her updated medication list for this problem includes:    Hydrochlorothiazide 25 Mg Tabs (Hydrochlorothiazide) .Marland Kitchen... 1 once daily  Complete Medication List: 1)  Hydrochlorothiazide 25 Mg Tabs (Hydrochlorothiazide) .Marland Kitchen.. 1 once daily 2)  Zoloft 50 Mg Tabs (Sertraline hcl) .Marland Kitchen.. 1 once daily 3)  Multi Vitamins Tabs (multiple Vitamins-minerals)  .... Once daily 4)  Hydrocodone-homatropine 5-1.5 Mg/18ml Syrp (Hydrocodone-homatropine) .... One half to 1 teaspoon every 6 hours as needed for cough 5)  Phentermine Hcl 15 Mg Caps (Phentermine hcl) .... One every morning  Patient Instructions: 1)  Please schedule a follow-up appointment in 6 months for CPX  2)  Advised not to eat any food or drink any liquids after 10 PM the night before your procedure. 3)  Limit your  Sodium (Salt) to less than 2 grams a day(slightly less than 1/2 a teaspoon) to prevent fluid retention, swelling, or worsening of symptoms. 4)  Get plenty of rest, drink lots of clear liquids, and use Tylenol or Ibuprofen for fever and comfort. Return in 7-10 days if you're not  better:sooner if you're feeling worse. Prescriptions: PHENTERMINE HCL 15 MG CAPS (PHENTERMINE HCL) one every morning  #90 x 0   Entered and Authorized by:   Gordy Savers  MD   Signed by:   Gordy Savers  MD on 05/18/2010   Method used:   Print then Give to Patient   RxID:   [id number] HYDROCODONE-HOMATROPINE 5-1.5 MG/5ML SYRP (HYDROCODONE-HOMATROPINE) one half to 1 teaspoon every 6 hours as needed for cough  #6 oz x 0   Entered and Authorized by:   Gordy Savers  MD   Signed by:   Gordy Savers  MD on 05/18/2010   Method used:   Print then Give to Patient   RxID:   [id number] ZOLOFT 50 MG  TABS (SERTRALINE HCL) 1 once daily  #90 Each x 3   Entered and Authorized by:   Gordy Savers  MD   Signed by:   Gordy Savers  MD on 05/18/2010   Method used:   Print then Give to Patient   RxID:   [id number] HYDROCHLOROTHIAZIDE 25 MG TABS (HYDROCHLOROTHIAZIDE) 1 once daily  #90 Each x 3   Entered and Authorized by:   Gordy Savers  MD   Signed by:   Gordy Savers  MD on 05/18/2010   Method used:   Print then Give to Patient   RxID:   [id number]    Orders Added: 1)  Est. Patient Level III [32951]

## 2010-10-20 NOTE — Assessment & Plan Note (Signed)
[facility]                              [facility] OFFICE NOTE   NAME:Teresa Frey, Teresa Frey                        MRN:          [id number]  DATE:04/02/2006                            DOB:          01-14-1930    A 78 year old female seen today for an annual comprehensive exam.  She has  history of DJD status post hip surgery.  She has history of colonic polyps,  hypertension now controlled without treatment.  There is a history of  diverticular disease and a family history of colon cancer.  Last colonoscopy  was about 3-and-a-half years ago.  She has had remote tonsillectomy,  appendectomy, and tubal ligation.  She is doing well today.  She takes  Zoloft for a number of years.   FAMILY HISTORY:  Unchanged.  Father died of Hodgkin's disease and secondary  leukemia at age 52.  Mother died of pancreatic cancer at 63.  Three  brothers.  One deceased of colon, another from lung cancer.  One other  brother living with coronary artery disease.   SOCIAL HISTORY:  Married, nonsmoker.   EXAMINATION:  GENERAL:  Revealed a healthy-appearing, fit lady in no acute  distress.  VITAL SIGNS:  Blood pressure is 130/80.  SKIN:  Negative.  HEENT:  Fundi, ear, nose and throat clear.  NECK:  No bruits.  CHEST:  Revealed a few crackles at the right base.  CARDIOVASCULAR:  Normal heart sounds, no murmurs.  BREAST EXAM:  Negative.  ABDOMEN:  Revealed some minimal right upper quadrant tenderness.  No  organomegaly was noted, no bruits appreciated.  PELVIC:  Revealed normal cervix, no adnexal masses.  RECTAL:  Heme negative.  EXTREMITIES:  Revealed intact peripheral pulses.  There is no edema.   IMPRESSION:  1. Degenerative joint disease.  2. Colonic polyps.  3. History of depression.  4. Menopausal syndrome.   DISPOSITION:  Medical regimen unchanged.  Laboratory update will be  reviewed.  Both the flu vaccine and Pneumovax will be administered.  She  will be  reassessed in 1 year or p.r.n.    ______________________________  Gordy Savers, MD    PFK/MedQ  DD: 04/02/2006  DT: 04/02/2006  Job #: [id number]

## 2010-10-20 NOTE — Discharge Summary (Signed)
Teresa Frey, Teresa Frey                 ACCOUNT NO.:  [id number]   MEDICAL RECORD NO.:  [id number]          PATIENT TYPE:  INP   LOCATION:  5016                         FACILITY:  [facility]   PHYSICIAN:  Burnard Bunting, M.D.    DATE OF BIRTH:  [birth year]   DATE OF ADMISSION:  08/15/2004  DATE OF DISCHARGE:  08/18/2004                                 DISCHARGE SUMMARY   DISCHARGE DIAGNOSES:  Right hip arthritis.   SECONDARY DIAGNOSES:  1.  History of left total hip replacement.  2.  Fracture of left knee.  3.  Left hip revision August 1999.  4.  Tubal ligation.  5.  Tonsillectomy.  6.  Appendectomy.   OPERATION/PROCEDURE:  Right total hip replacement.   HOSPITAL COURSE:  Teresa Frey is a 77 year old female who underwent right  total hip replacement on August 15, 2004.  Tolerated procedure well without  immediate complication.  Motor/sensory function in bilateral lower  extremities was intact on postoperative day #1.  Coumadin was started for  DVT prophylaxis.  Physical therapy helped the patient mobilize,  weightbearing as tolerated.  Hemoglobin was 8.2 on postoperative day #2.  Incision was intact on postoperative day #3.  Patient had an otherwise  unremarkable recovery, thus she was transferred to [facility] August 18, 2004 for  further rehabilitation and mobilization.  She is discharged in good  condition.   DISCHARGE MEDICATIONS:  Admission medications plus Coumadin for DVT  prophylaxis, Percocet for pain, and Robaxin as a muscle relaxer.       GSD/MEDQ  D:  11/09/2004  T:  11/09/2004  Job:  [id number]

## 2010-10-20 NOTE — Op Note (Signed)
NAMESYBIL, SHRADER                 ACCOUNT NO.:  [id number]   MEDICAL RECORD NO.:  [id number]          PATIENT TYPE:  INP   LOCATION:  5016                         FACILITY:  [facility]   PHYSICIAN:  Burnard Bunting, M.D.    DATE OF BIRTH:  Jan 23, 1930   DATE OF PROCEDURE:  08/15/2004  DATE OF DISCHARGE:                                 OPERATIVE REPORT   PREOPERATIVE DIAGNOSIS:  Right hip arthritis.   POSTOPERATIVE DIAGNOSIS:  Right hip arthritis.   PROCEDURE:  Right total hip replacement.   SURGEON:  Burnard Bunting, M.D.   ASSISTANT:  __________   ANESTHESIA:  General endotracheal.   ESTIMATED BLOOD LOSS:  500 cc.   DRAINS:  Hemovac x1.   COMPONENTS:  [employer] Accolade size 3, 52 Press-Fit cup, +4 ceramic head and  liner.   PROCEDURE IN DETAIL:  The patient was brought to the operating room where  general endotracheal anesthesia was induced.  Preoperative IV antibiotics  were administered. The patient in the lateral decubitus position with the  left peroneal nerve and left axilla well padded.  The right hip, leg, and  foot were prepped with DuraPrep solution and draped in a sterile manner and  covered with Ioban.  A posterior approach to the hip was utilized.  The skin  and subcutaneous tissue was sharply divided. Fascia lata was encountered.  Fascia lata was then divided.  The gluteus maximus muscle was split parallel  to its fibers.  Bleeding point encountered were controlled using  electrocautery.  The sciatic nerve was palpated  It did split in two  branches. Both of these were palpated and protected at all times during the  remaining portion of the case.  A  Charnley retractor was placed.  The  piriformis tendon was tagged and retracted.  External rotators were then  divided and separated from the capsule as a separate layer.  Bleeding point  encountered were controlled with electrocautery.  The capsule was then split  and tagged in a T-shaped fashion using #1 Ethibond  suture.  The head was  dislocated.  A long neck cut was made.  The patient did have a preoperative  leg length discrepancy of slightly more than 2 cm.  Following the neck cut  resection, the acetabulum was prepared.  The fovea was identified.  The  labrum was excised.  Anterior acetabular retractor was carefully placed on  the anterior column.  The acetabulum was then reamed up to a size 52.  This  was reamed in approximately 45 degrees of abduction and 25 degrees of  anteversion.  Trial cup and liner was placed.  The canal was then prepared  using Accolade Press-Fit stem in about 5 degrees more than native  anteversion.  With the trial stem in place, the head was reduced.  The leg  was found to achieve full extension and 90 degrees of knee flexion and found  to be stable in the position of sleep and external rotation.  It was also  found to be stable in slight hip adduction, 90 degrees of flexion, and  55  degrees of internal rotation.  Intraoperative radiograph demonstrated good  position of the components.  The trial components were removed.  The true  hip socket as then placed with a good Press-Fit obtained.  The liner was  then placed  The size 3 stem was the placed and retrialed with a +0 and +4  neck length. Good stability was achieved with the +4 neck length, and thus  it was chosen.  The ceramic head was then tapped onto the dry trunion and  the hip was again reduced and found to be stable in external rotation and  full extension, in the position of sleep, as well as 90 degrees of hip  flexion, 10 degrees of hip abduction, and 60 degrees of internal rotation.  At this time, the incision was thoroughly irrigated. A Hemovac drain was  placed.  The capsule was closed using #1 Ethibond suture.  The sciatic nerve  was palpated and found to be intact.  The fascia was closed using  interrupted inverted #1 Vicryl sutures.  The skin was closed using  interrupted inverted 2-0 Vicryl suture  and skin staples.  The patient was  placed in an abduction pillow. She was noted to have dorsiflexion of the  foot before leaving the OR.      GSD/MEDQ  D:  08/16/2004  T:  08/16/2004  Job:  [id number]

## 2010-10-20 NOTE — Discharge Summary (Signed)
Teresa Frey, Teresa Frey                 ACCOUNT NO.:  [id number]   MEDICAL RECORD NO.:  [id number]          PATIENT TYPE:  ORB   LOCATION:  4505                         FACILITY:  [facility]   PHYSICIAN:  Ellwood Dense, M.D.   DATE OF BIRTH:  [birth year]   DATE OF ADMISSION:  08/18/2004  DATE OF DISCHARGE:  08/25/2004                                 DISCHARGE SUMMARY   DISCHARGE DIAGNOSES:  1.  Right total hip arthroplasty secondary to osteoarthritis, August 15, 2004.  2.  Pain management.  3.  Coumadin for deep vein thrombosis prophylaxis.  4.  Anemia.  5.  History of left total hip replacement in 1989, with revision in 1999.  6.  Depression.   HISTORY OF PRESENT ILLNESS:  This is a 77 year old female admitted August 15, 2004 with advanced right hip pain and no relief with conservative care.  X-  rays and imaging showed osteoarthritis.  Underwent a right total hip  arthroplasty August 15, 2004 per Dr. August Saucer.  Placed on Coumadin for deep vein  thrombosis prophylaxis.  Weightbearing as tolerated.  Postoperative anemia  at 7.5.  Transfused 1 unit packed red blood cells, August 18, 2004.  Admitted  to subacute care services.   PAST MEDICAL HISTORY:  See discharge diagnoses.  Occasional alcohol.  Remote  tobacco.   ALLERGIES:  None.   SOCIAL HISTORY:  Lives with family in [place].  Family works day shift.  One-level home.  Two steps to entry.   MEDICATIONS PRIOR TO ADMISSION:  1.  Zoloft 150 mg daily.  2.  Os-Cal daily.   HOSPITAL COURSE:  Patient with progressive gains while on rehab services,  with therapies initiated daily.  The following issues were followed during  the patient's rehab course.  Pertaining to Mrs. Rabideau's right total hip  arthroplasty, surgical site healing nicely.  No signs of infection.  Total  hip precautions were initiated.  She was weightbearing as tolerated.  Neurovascular sensation remained intact.  She was ambulating supervision  level 110 feet with a  rolling walker, needing minimal assist for lower body  dressing, simple setup for upper body dressing, minimal assist for car  transfers as well as stairs, supervision bed mobility.  Home health  therapies had been arranged.  She remained on Coumadin for deep vein  thrombosis prophylaxis, with latest INR of 1.8.  She remained on  subcutaneous Lovenox until INR greater than 2.  She would be followed by  Genevieve Norlander to complete Coumadin protocol.  Pain management ongoing with the use  of oxycodone, with good results.  Postoperative anemia with transfusion of  7.5.  Hemoglobin improved to 9.7 after 1 unit of packed red blood cells.  She denied any chest pain or shortness of breath throughout this course.  Blood pressures remained controlled without the use of antihypertensive  medications.  She continued on her Zoloft for history of depression.  She  had no bowel or bladder disturbances.   Latest labs showed an INR of 1.8, hemoglobin 9.7, hematocrit 28.8.  Sodium  140, potassium 3.8, BUN  9, creatinine 0.6.   DISCHARGE MEDICATIONS AT TIME OF DICTATION:  1.  Coumadin, with latest dose of 10 mg, adjusted accordingly, to be      completed on September 15, 2004.  2.  Multivitamin daily.  3.  Oxycodone 5 mg, 1 or 2 tablets q.4 h. as needed for pain.   ACTIVITY:  As tolerated, with hip precautions.   DIET:  Regular.   WOUND CARE:  Cleanse incision daily with warm soap and water.   SPECIAL INSTRUCTIONS:  Home health nurse per Genevieve Norlander to complete Coumadin  protocol.  She should follow up with Dr. August Saucer in orthopedic services.      DA/MEDQ  D:  08/24/2004  T:  08/24/2004  Job:  [id number]   cc:   Ellwood Dense, M.D.  [address] [place]  [place] [address]  Fax: [phone]   Gordy Savers, M.D. [facility]   Burnard Bunting, M.D.  Fax: [phone]

## 2010-11-27 ENCOUNTER — Encounter: Payer: Self-pay | Admitting: Internal Medicine

## 2010-12-05 ENCOUNTER — Encounter: Payer: Self-pay | Admitting: Internal Medicine

## 2010-12-07 ENCOUNTER — Ambulatory Visit (INDEPENDENT_AMBULATORY_CARE_PROVIDER_SITE_OTHER): Payer: Medicare Other | Admitting: Internal Medicine

## 2010-12-07 ENCOUNTER — Encounter: Payer: Self-pay | Admitting: Internal Medicine

## 2010-12-07 DIAGNOSIS — I10 Essential (primary) hypertension: Secondary | ICD-10-CM

## 2010-12-07 DIAGNOSIS — R635 Abnormal weight gain: Secondary | ICD-10-CM

## 2010-12-07 DIAGNOSIS — M1990 Unspecified osteoarthritis, unspecified site: Secondary | ICD-10-CM

## 2010-12-07 MED ORDER — PHENTERMINE HCL 30 MG PO CAPS: 30.0000 mg | ORAL_CAPSULE | ORAL | Status: DC

## 2010-12-07 MED ORDER — SERTRALINE HCL 50 MG PO TABS: 50.0000 mg | ORAL_TABLET | Freq: Every day | ORAL | Status: DC

## 2010-12-07 MED ORDER — HYDROCHLOROTHIAZIDE 25 MG PO TABS: 25.0000 mg | ORAL_TABLET | Freq: Every day | ORAL | Status: DC

## 2010-12-07 NOTE — Patient Instructions (Signed)
You need to lose weight.  Consider a lower calorie diet and regular exercise.    It is important that you exercise regularly, at least 20 minutes 3 to 4 times per week.  If you develop chest pain or shortness of breath seek  medical attention.  Please check your blood pressure on a regular basis.  If it is consistently greater than 150/90, please make an office appointment.   Return in 6 months for follow-up

## 2010-12-07 NOTE — Progress Notes (Signed)
  Subjective:    Patient ID: Teresa Frey, female    DOB: 1930-04-05, 77 y.o.   MRN: [id number]  HPI88 year old patient who is seen today for followup. She has hypertension which has been well controlled on diuretic therapy. She has a history also of mild anxiety disorder which has been nicely controlled with sertraline 50 mg daily. She is requesting a refill on phentermine which has been quite helpful for weight gain in the past. Otherwise doing quite well. Denies any cardiopulmonary complaints. Her weight is down 3 pounds over the past 6 months    Review of Systems  Constitutional: Negative.   HENT: Negative for hearing loss, congestion, sore throat, rhinorrhea, dental problem, sinus pressure and tinnitus.   Eyes: Negative for pain, discharge and visual disturbance.  Respiratory: Negative for cough and shortness of breath.   Cardiovascular: Negative for chest pain, palpitations and leg swelling.  Gastrointestinal: Negative for nausea, vomiting, abdominal pain, diarrhea, constipation, blood in stool and abdominal distention.  Genitourinary: Negative for dysuria, urgency, frequency, hematuria, flank pain, vaginal bleeding, vaginal discharge, difficulty urinating, vaginal pain and pelvic pain.  Musculoskeletal: Negative for joint swelling, arthralgias and gait problem.  Skin: Negative for rash.  Neurological: Negative for dizziness, syncope, speech difficulty, weakness, numbness and headaches.  Hematological: Negative for adenopathy.  Psychiatric/Behavioral: Negative for behavioral problems, dysphoric mood and agitation. The patient is not nervous/anxious.        Objective:   Physical Exam  Constitutional: She is oriented to person, place, and time. She appears well-developed and well-nourished.  HENT:  Head: Normocephalic and atraumatic.  Right Ear: External ear normal.  Left Ear: External ear normal.  Mouth/Throat: Oropharynx is clear and moist.  Eyes: Conjunctivae and EOM are  normal. Pupils are equal, round, and reactive to light.  Neck: Normal range of motion. Neck supple. No JVD present. No thyromegaly present.  Cardiovascular: Normal rate, regular rhythm, normal heart sounds and intact distal pulses.   No murmur heard. Pulmonary/Chest: Effort normal and breath sounds normal. She has no wheezes. She has no rales.  Abdominal: Soft. Bowel sounds are normal. She exhibits no distension and no mass. There is no tenderness. There is no rebound and no guarding.  Genitourinary: Vagina normal.  Musculoskeletal: Normal range of motion. She exhibits no edema and no tenderness.  Lymphadenopathy:    She has no cervical adenopathy.  Neurological: She is alert and oriented to person, place, and time. She has normal reflexes. No cranial nerve deficit. She exhibits normal muscle tone. Coordination normal.  Skin: Skin is warm and dry. No rash noted.  Psychiatric: She has a normal mood and affect. Her behavior is normal.          Assessment & Plan:   Hypertension well controlled Exogenous obesity. Will refill on phentermine short-term  Recheck 6 months

## 2011-06-11 ENCOUNTER — Other Ambulatory Visit: Payer: Self-pay | Admitting: Internal Medicine

## 2011-06-11 ENCOUNTER — Encounter: Payer: Self-pay | Admitting: Internal Medicine

## 2011-06-11 ENCOUNTER — Ambulatory Visit (INDEPENDENT_AMBULATORY_CARE_PROVIDER_SITE_OTHER): Payer: Medicare Other | Admitting: Internal Medicine

## 2011-06-11 VITALS — BP 120/80 | HR 70 | Temp 97.8°F | Resp 18 | Ht 63.0 in | Wt 155.0 lb

## 2011-06-11 DIAGNOSIS — E785 Hyperlipidemia, unspecified: Secondary | ICD-10-CM | POA: Diagnosis not present

## 2011-06-11 DIAGNOSIS — I10 Essential (primary) hypertension: Secondary | ICD-10-CM

## 2011-06-11 DIAGNOSIS — Z0000 Encounter for general adult medical examination without abnormal findings: Secondary | ICD-10-CM | POA: Diagnosis not present

## 2011-06-11 DIAGNOSIS — Z86010 Personal history of colonic polyps: Secondary | ICD-10-CM

## 2011-06-11 DIAGNOSIS — M1990 Unspecified osteoarthritis, unspecified site: Secondary | ICD-10-CM

## 2011-06-11 LAB — LIPID PANEL
Cholesterol: 182 mg/dL (ref 0–200)
LDL Cholesterol: 98 mg/dL (ref 0–99)
Triglycerides: 173 mg/dL — ABNORMAL HIGH (ref 0.0–149.0)
VLDL: 34.6 mg/dL (ref 0.0–40.0)

## 2011-06-11 LAB — CBC WITH DIFFERENTIAL/PLATELET
Basophils Relative: 0.2 % (ref 0.0–3.0)
Eosinophils Absolute: 0.1 10*3/uL (ref 0.0–0.7)
HCT: 46.7 % — ABNORMAL HIGH (ref 36.0–46.0)
Hemoglobin: 15.4 g/dL — ABNORMAL HIGH (ref 12.0–15.0)
Lymphocytes Relative: 30 % (ref 12.0–46.0)
MCHC: 33 g/dL (ref 30.0–36.0)
MCV: 93.3 fl (ref 78.0–100.0)
Monocytes Absolute: 0.6 10*3/uL (ref 0.1–1.0)
Neutro Abs: 6.4 10*3/uL (ref 1.4–7.7)
RBC: 5 Mil/uL (ref 3.87–5.11)

## 2011-06-11 MED ORDER — HYDROCHLOROTHIAZIDE 25 MG PO TABS: 25.0000 mg | ORAL_TABLET | Freq: Every day | ORAL | Status: DC

## 2011-06-11 MED ORDER — SERTRALINE HCL 50 MG PO TABS: 50.0000 mg | ORAL_TABLET | Freq: Every day | ORAL | Status: DC

## 2011-06-11 MED ORDER — PHENTERMINE HCL 30 MG PO CAPS: 30.0000 mg | ORAL_CAPSULE | ORAL | Status: DC

## 2011-06-11 NOTE — Progress Notes (Signed)
Subjective:    Patient ID: Teresa Frey, female    DOB: [birth year], 78 y.o.   MRN: [id number]  HPI CC: cpx - doing well.  History of Present Illness:   38 -year-old patient who is seen today for a preventive health examination. Medical problems include treated hypertension, as well as osteoarthritis. She is followed closely by GI to do a personal history of colonic polyps and a family history of colon cancer. She is doing well today.   Here for Medicare AWV:   1. Risk factors based on Past M, S, F history: cardiac risk factors include hypertension only  2. Physical Activities: remains fairly active, although limited somewhat by osteoarthritis  3. Depression/mood- history of mild depression, which has been quite stable on Zoloft  4. Hearing: no hearing deficits  5. ADL's: independent in all aspects of daily living  6. Fall Risk: low  7. Home Safety: no problems identified  8. Height, weight, &visual acuity:height and weight stable. No change in visual acuity  9. Counseling: heart healthy diet regular. Exercise encouraged  10. Labs ordered based on risk factors: laboratory profile, including lipid panel will be reviewed  11. Referral Coordination- none appropriate at this time; will be followed colonoscopy Spring 2013  12. Care Plan- heart healthy diet modest weight loss encouraged; calcium supplementation encouraged  13. Cognitive Assessment- alert and oriented, with normal affect. No cognitive dysfunction, handles all executive functions without difficulty   Allergies (verified):  No Known Drug Allergies   Past History:  Past Medical History:  Reviewed history from 11/13/2007 and no changes required.  DJD  Colonic polyps, hx of  Diverticulitis, hx of  Hypertension  Anxiety  Paget's disease, lumbar spine and sacrum   Past Surgical History:  Reviewed history from 11/03/2008 and no changes required.  Appendectomy  Total hip replacement left 1989, with revision in 1999; status  post right total hip replacement surgery in March 2006  Tubal ligation  Tonsillectomy  colonoscopy May 2007, May 2010   Family History:  Reviewed history from 05/05/2008 and no changes required.  Family History of Colon CA 1st degree relative <60  Family History Lung cancer  Family History of Cardiovascular disorder  Family History of Leukemia  Family History of Hodgkins  father died of Hodgkin's diseasedisease and leukemia at age 51  mother died age 16, pancreatic cancer  Three brothers, one deceased of colon cancer. Another from lung cancer  third brother died with pancreatic cancer   Social History:  Reviewed history from 05/05/2009 and no changes required.  Occupation:  Married     Review of Systems  Constitutional: Negative.   HENT: Negative for hearing loss, congestion, sore throat, rhinorrhea, dental problem, sinus pressure and tinnitus.   Eyes: Negative for pain, discharge and visual disturbance.  Respiratory: Negative for cough and shortness of breath.   Cardiovascular: Negative for chest pain, palpitations and leg swelling.  Gastrointestinal: Negative for nausea, vomiting, abdominal pain, diarrhea, constipation, blood in stool and abdominal distention.  Genitourinary: Negative for dysuria, urgency, frequency, hematuria, flank pain, vaginal bleeding, vaginal discharge, difficulty urinating, vaginal pain and pelvic pain.  Musculoskeletal: Negative for joint swelling, arthralgias and gait problem.  Skin: Negative for rash.  Neurological: Negative for dizziness, syncope, speech difficulty, weakness, numbness and headaches.  Hematological: Negative for adenopathy.  Psychiatric/Behavioral: Negative for behavioral problems, dysphoric mood and agitation. The patient is not nervous/anxious.        Objective:   Physical Exam  Constitutional: She is oriented to  person, place, and time. She appears well-developed and well-nourished.  HENT:  Head: Normocephalic and  atraumatic.  Right Ear: External ear normal.  Left Ear: External ear normal.  Mouth/Throat: Oropharynx is clear and moist.  Eyes: Conjunctivae and EOM are normal.  Neck: Normal range of motion. Neck supple. No JVD present. No thyromegaly present.  Cardiovascular: Normal rate, regular rhythm, normal heart sounds and intact distal pulses.   No murmur heard. Pulmonary/Chest: Effort normal and breath sounds normal. She has no wheezes. She has no rales.  Abdominal: Soft. Bowel sounds are normal. She exhibits no distension and no mass. There is no tenderness. There is no rebound and no guarding.  Musculoskeletal: Normal range of motion. She exhibits no edema and no tenderness.  Neurological: She is alert and oriented to person, place, and time. She has normal reflexes. No cranial nerve deficit. She exhibits normal muscle tone. Coordination normal.  Skin: Skin is warm and dry. No rash noted.  Psychiatric: She has a normal mood and affect. Her behavior is normal.          Assessment & Plan:    Preventive health examination Osteoarthritis Hypertension well controlled  We'll check updated labs. Medications were refilled low-salt diet more exercise encouraged we'll recheck one year

## 2011-06-11 NOTE — Patient Instructions (Signed)
Limit your sodium (Salt) intake    It is important that you exercise regularly, at least 20 minutes 3 to 4 times per week.  If you develop chest pain or shortness of breath seek  medical attention.  Return in one year for follow-up   

## 2011-06-12 LAB — COMPREHENSIVE METABOLIC PANEL
AST: 22 U/L (ref 0–37)
Alkaline Phosphatase: 146 U/L — ABNORMAL HIGH (ref 39–117)
BUN: 12 mg/dL (ref 6–23)
Creatinine, Ser: 0.7 mg/dL (ref 0.4–1.2)
Total Bilirubin: 0.6 mg/dL (ref 0.3–1.2)

## 2011-09-28 ENCOUNTER — Encounter: Payer: Self-pay | Admitting: Internal Medicine

## 2011-10-03 ENCOUNTER — Encounter: Payer: Self-pay | Admitting: Internal Medicine

## 2011-10-31 ENCOUNTER — Ambulatory Visit (AMBULATORY_SURGERY_CENTER): Payer: Medicare Other | Admitting: *Deleted

## 2011-10-31 VITALS — Ht 65.0 in | Wt 156.1 lb

## 2011-10-31 DIAGNOSIS — K5730 Diverticulosis of large intestine without perforation or abscess without bleeding: Secondary | ICD-10-CM

## 2011-10-31 DIAGNOSIS — Z86010 Personal history of colonic polyps: Secondary | ICD-10-CM

## 2011-10-31 MED ORDER — PEG-KCL-NACL-NASULF-NA ASC-C 100 G PO SOLR: 1.0000 | Freq: Once | ORAL | Status: DC

## 2011-11-14 ENCOUNTER — Encounter: Payer: Self-pay | Admitting: Internal Medicine

## 2011-11-14 ENCOUNTER — Ambulatory Visit (AMBULATORY_SURGERY_CENTER): Payer: Medicare Other | Admitting: Internal Medicine

## 2011-11-14 VITALS — BP 137/59 | HR 63 | Temp 99.1°F | Resp 18 | Ht 65.0 in | Wt 156.0 lb

## 2011-11-14 DIAGNOSIS — D126 Benign neoplasm of colon, unspecified: Secondary | ICD-10-CM

## 2011-11-14 DIAGNOSIS — Z800 Family history of malignant neoplasm of digestive organs: Secondary | ICD-10-CM | POA: Diagnosis not present

## 2011-11-14 DIAGNOSIS — Z86010 Personal history of colonic polyps: Secondary | ICD-10-CM | POA: Diagnosis not present

## 2011-11-14 DIAGNOSIS — Z1211 Encounter for screening for malignant neoplasm of colon: Secondary | ICD-10-CM

## 2011-11-14 DIAGNOSIS — K5730 Diverticulosis of large intestine without perforation or abscess without bleeding: Secondary | ICD-10-CM

## 2011-11-14 MED ORDER — SODIUM CHLORIDE 0.9 % IV SOLN: 500.0000 mL | INTRAVENOUS | Status: DC

## 2011-11-14 NOTE — Progress Notes (Signed)
Patient did not experience any of the following events: a burn prior to discharge; a fall within the facility; wrong site/side/patient/procedure/implant event; or a hospital transfer or hospital admission upon discharge from the facility. (G8907) Patient did not have preoperative order for IV antibiotic SSI prophylaxis. (G8918)  

## 2011-11-14 NOTE — Op Note (Signed)
[facility] [address] [place], [place]  [address]  COLONOSCOPY PROCEDURE REPORT  PATIENT:  Teresa Frey, Teresa Frey  MR#:  [id number] BIRTHDATE:  [birth year], 82 yrs. old  GENDER:  female ENDOSCOPIST:  Wilhemina Bonito. Eda Keys, MD REF. BY:  Surveillance Program Recall, PROCEDURE DATE:  11/14/2011 PROCEDURE:  Colonoscopy with snare polypectomy  X 5 ASA CLASS:  Class II INDICATIONS:  history of pre-cancerous (adenomatous) colon polyps, family history of colon cancer, surveillance and high-risk screening ; PRIOR EXAMS 1999, 01,04,07,10 W/ MULTIPLE ADENOMAS. S[facility] CRC MEDICATIONS:   MAC sedation, administered by CRNA, propofol (Diprivan) 300 mg IV  DESCRIPTION OF PROCEDURE:   After the risks benefits and alternatives of the procedure were thoroughly explained, informed consent was obtained.  Digital rectal exam was performed and revealed no abnormalities.   The LB [id number] endoscope was introduced through the anus and advanced to the cecum, which was identified by both the appendix and ileocecal valve, without limitations.  The quality of the prep was good, using MoviPrep. The instrument was then slowly withdrawn as the colon was fully examined. <<PROCEDUREIMAGES>>  FINDINGS:  There were five polyps identified and removed from the ascending (10mm), transverse (4,5,50mm) and  sigmoid (3mm) colon. Polyps were snared without cautery. Retrieval was successful. Moderate diverticulosis was found ascending colon and sigmoid colon.   Retroflexed views in the rectum revealed internal hemorrhoids.    The time to cecum =  8:06 ( 2 polyps removed on way in)  minutes. The scope was then withdrawn in  13:04  minutes from the cecum and the procedure completed.  COMPLICATIONS:  None  ENDOSCOPIC IMPRESSION: 1) Polyps, multiple ascending colon to sigmoid colon - removed 2) Moderate diverticulosis ascending colon and sigmoid colon 3) Internal hemorrhoids  RECOMMENDATIONS: 1) Repeat  Colonoscopy in 3 years, if medically fit.  ______________________________ Wilhemina Bonito. Eda Keys, MD  CC:  Gordy Savers, MD;  The Patient  n. eSIGNED:   Wilhemina Bonito. Eda Keys at 11/14/2011 11:11 AM  Dossie Der, [phone]

## 2011-11-14 NOTE — Patient Instructions (Signed)
YOU HAD AN ENDOSCOPIC PROCEDURE TODAY AT THE [facility]: Refer to the procedure report that was given to you for any specific questions about what was found during the examination.  If the procedure report does not answer your questions, please call your gastroenterologist to clarify.  If you requested that your care partner not be given the details of your procedure findings, then the procedure report has been included in a sealed envelope for you to review at your convenience later.  YOU SHOULD EXPECT: Some feelings of bloating in the abdomen. Passage of more gas than usual.  Walking can help get rid of the air that was put into your GI tract during the procedure and reduce the bloating. If you had a lower endoscopy (such as a colonoscopy or flexible sigmoidoscopy) you may notice spotting of blood in your stool or on the toilet paper. If you underwent a bowel prep for your procedure, then you may not have a normal bowel movement for a few days.  DIET: Your first meal following the procedure should be a light meal and then it is ok to progress to your normal diet.  A half-sandwich or bowl of soup is an example of a good first meal.  Heavy or fried foods are harder to digest and may make you feel nauseous or bloated.  Likewise meals heavy in dairy and vegetables can cause extra gas to form and this can also increase the bloating.  Drink plenty of fluids but you should avoid alcoholic beverages for 24 hours.  ACTIVITY: Your care partner should take you home directly after the procedure.  You should plan to take it easy, moving slowly for the rest of the day.  You can resume normal activity the day after the procedure however you should NOT DRIVE or use heavy machinery for 24 hours (because of the sedation medicines used during the test).    SYMPTOMS TO REPORT IMMEDIATELY: A gastroenterologist can be reached at any hour.  During normal business hours, 8:30 AM to 5:00 PM Monday through Friday,  call [phone].  After hours and on weekends, please call the GI answering service at [phone] who will take a message and have the physician on call contact you.   Following lower endoscopy (colonoscopy or flexible sigmoidoscopy):  Excessive amounts of blood in the stool  Significant tenderness or worsening of abdominal pains  Swelling of the abdomen that is new, acute  Fever of 100F or higher  Following upper endoscopy (EGD)  Vomiting of blood or coffee ground material  New chest pain or pain under the shoulder blades  Painful or persistently difficult swallowing  New shortness of breath  Fever of 100F or higher  Black, tarry-looking stools  FOLLOW UP: If any biopsies were taken you will be contacted by phone or by letter within the next 1-3 weeks.  Call your gastroenterologist if you have not heard about the biopsies in 3 weeks.  Our staff will call the home number listed on your records the next business day following your procedure to check on you and address any questions or concerns that you may have at that time regarding the information given to you following your procedure. This is a courtesy call and so if there is no answer at the home number and we have not heard from you through the emergency physician on call, we will assume that you have returned to your regular daily activities without incident.  SIGNATURES/CONFIDENTIALITY: You and/or your care   partner have signed paperwork which will be entered into your electronic medical record.  These signatures attest to the fact that that the information above on your After Visit Summary has been reviewed and is understood.  Full responsibility of the confidentiality of this discharge information lies with you and/or your care-partner.  

## 2011-11-15 ENCOUNTER — Telehealth: Payer: Self-pay | Admitting: *Deleted

## 2011-11-15 NOTE — Telephone Encounter (Signed)
  Follow up Call-  Call back number 11/14/2011  Post procedure Call Back phone  # [phone] work  Permission to leave phone message Yes     Patient questions:  Do you have a fever, pain , or abdominal swelling? no Pain Score  0 *  Have you tolerated food without any problems? yes  Have you been able to return to your normal activities? yes  Do you have any questions about your discharge instructions: Diet   no Medications  no Follow up visit  no  Do you have questions or concerns about your Care? no  Actions: * If pain score is 4 or above: No action needed, pain <4.

## 2011-11-20 ENCOUNTER — Encounter: Payer: Self-pay | Admitting: Internal Medicine

## 2011-12-11 DIAGNOSIS — Z1231 Encounter for screening mammogram for malignant neoplasm of breast: Secondary | ICD-10-CM | POA: Diagnosis not present

## 2011-12-11 LAB — HM MAMMOGRAPHY: HM Mammogram: NEGATIVE

## 2011-12-13 ENCOUNTER — Encounter: Payer: Self-pay | Admitting: Internal Medicine

## 2012-03-10 DIAGNOSIS — Z961 Presence of intraocular lens: Secondary | ICD-10-CM | POA: Diagnosis not present

## 2012-03-10 DIAGNOSIS — H01009 Unspecified blepharitis unspecified eye, unspecified eyelid: Secondary | ICD-10-CM | POA: Diagnosis not present

## 2012-03-10 DIAGNOSIS — H2640 Unspecified secondary cataract: Secondary | ICD-10-CM | POA: Diagnosis not present

## 2012-03-19 DIAGNOSIS — H2640 Unspecified secondary cataract: Secondary | ICD-10-CM | POA: Diagnosis not present

## 2012-03-19 DIAGNOSIS — H26499 Other secondary cataract, unspecified eye: Secondary | ICD-10-CM | POA: Diagnosis not present

## 2012-05-07 ENCOUNTER — Ambulatory Visit (INDEPENDENT_AMBULATORY_CARE_PROVIDER_SITE_OTHER): Payer: Medicare Other | Admitting: Internal Medicine

## 2012-05-07 DIAGNOSIS — Z23 Encounter for immunization: Secondary | ICD-10-CM | POA: Diagnosis not present

## 2012-05-08 ENCOUNTER — Ambulatory Visit: Payer: Medicare Other | Admitting: Internal Medicine

## 2012-06-11 ENCOUNTER — Encounter: Payer: Self-pay | Admitting: Internal Medicine

## 2012-06-11 ENCOUNTER — Ambulatory Visit (INDEPENDENT_AMBULATORY_CARE_PROVIDER_SITE_OTHER): Payer: Medicare Other | Admitting: Internal Medicine

## 2012-06-11 VITALS — BP 140/80 | HR 72 | Temp 97.9°F | Resp 18 | Ht 63.25 in | Wt 157.0 lb

## 2012-06-11 DIAGNOSIS — Z86010 Personal history of colonic polyps: Secondary | ICD-10-CM

## 2012-06-11 DIAGNOSIS — M1990 Unspecified osteoarthritis, unspecified site: Secondary | ICD-10-CM | POA: Diagnosis not present

## 2012-06-11 DIAGNOSIS — I10 Essential (primary) hypertension: Secondary | ICD-10-CM | POA: Diagnosis not present

## 2012-06-11 DIAGNOSIS — Z0000 Encounter for general adult medical examination without abnormal findings: Secondary | ICD-10-CM | POA: Diagnosis not present

## 2012-06-11 LAB — CBC WITH DIFFERENTIAL/PLATELET
Basophils Absolute: 0 10*3/uL (ref 0.0–0.1)
Basophils Relative: 0.3 % (ref 0.0–3.0)
Eosinophils Absolute: 0.1 10*3/uL (ref 0.0–0.7)
HCT: 45 % (ref 36.0–46.0)
Hemoglobin: 15.1 g/dL — ABNORMAL HIGH (ref 12.0–15.0)
Lymphocytes Relative: 26.3 % (ref 12.0–46.0)
Lymphs Abs: 2.7 10*3/uL (ref 0.7–4.0)
MCHC: 33.4 g/dL (ref 30.0–36.0)
Monocytes Relative: 6 % (ref 3.0–12.0)
Neutro Abs: 6.7 10*3/uL (ref 1.4–7.7)
RBC: 4.98 Mil/uL (ref 3.87–5.11)
RDW: 13.1 % (ref 11.5–14.6)

## 2012-06-11 LAB — COMPREHENSIVE METABOLIC PANEL
ALT: 16 U/L (ref 0–35)
AST: 18 U/L (ref 0–37)
BUN: 15 mg/dL (ref 6–23)
Creatinine, Ser: 0.7 mg/dL (ref 0.4–1.2)
Total Bilirubin: 0.8 mg/dL (ref 0.3–1.2)

## 2012-06-11 LAB — LIPID PANEL
Cholesterol: 184 mg/dL (ref 0–200)
HDL: 42.7 mg/dL (ref >39.00)
LDL Cholesterol: 116 mg/dL — ABNORMAL HIGH (ref 0–99)
Total CHOL/HDL Ratio: 4
Triglycerides: 126 mg/dL (ref 0.0–149.0)
VLDL: 25.2 mg/dL (ref 0.0–40.0)

## 2012-06-11 MED ORDER — PHENTERMINE HCL 30 MG PO CAPS
30.0000 mg | ORAL_CAPSULE | ORAL | Status: DC
Start: 2012-06-11 — End: 2012-12-29

## 2012-06-11 MED ORDER — HYDROCHLOROTHIAZIDE 25 MG PO TABS: 25.0000 mg | ORAL_TABLET | Freq: Every day | ORAL | Status: DC

## 2012-06-11 NOTE — Patient Instructions (Signed)
Limit your sodium (Salt) intake    It is important that you exercise regularly, at least 20 minutes 3 to 4 times per week.  If you develop chest pain or shortness of breath seek  medical attention.  Please check your blood pressure on a regular basis.  If it is consistently greater than 150/90, please make an office appointment.  Return in one year for follow-up  

## 2012-06-11 NOTE — Progress Notes (Signed)
Patient ID: Teresa Frey, female   DOB: [birth year], 79 y.o.   MRN: [id number]  Subjective:    Patient ID: Teresa Frey, female    DOB: 20-Aug-1929, 79 y.o.   MRN: [id number]  HPI CC: cpx - doing well.  History of Present Illness:   79  -year-old patient who is seen today for a preventive health examination. Medical problems include treated hypertension, as well as osteoarthritis. She is followed closely by GI to do a personal history of colonic polyps and a family history of colon cancer. She is doing well today.   Here for Medicare AWV:   1. Risk factors based on Past M, S, F history: cardiac risk factors include hypertension only  2. Physical Activities: remains fairly active, although limited somewhat by osteoarthritis  3. Depression/mood- history of mild depression, which has been quite stable on Zoloft  4. Hearing: no hearing deficits  5. ADL's: independent in all aspects of daily living  6. Fall Risk: low  7. Home Safety: no problems identified  8. Height, weight, &visual acuity:height and weight stable. No change in visual acuity  9. Counseling: heart healthy diet regular. Exercise encouraged  10. Labs ordered based on risk factors: laboratory profile, including lipid panel will be reviewed  11. Referral Coordination- none appropriate at this time; will be followed colonoscopy Spring 2013  12. Care Plan- heart healthy diet modest weight loss encouraged; calcium supplementation encouraged  13. Cognitive Assessment- alert and oriented, with normal affect. No cognitive dysfunction, handles all executive functions without difficulty   Allergies (verified):  No Known Drug Allergies   Past History:  Past Medical History:  Reviewed history from 11/13/2007 and no changes required.  DJD  Colonic polyps, hx of  Diverticulitis, hx of  Hypertension  Anxiety  Paget's disease, lumbar spine and sacrum   Past Surgical History:  Reviewed history from 11/03/2008 and no changes required.    Appendectomy  Total hip replacement left 1989, with revision in 1999; status post right total hip replacement surgery in March 2006  Tubal ligation  Tonsillectomy  colonoscopy May 2007, May 2010  June 2013  Family History:  Reviewed history from 05/05/2008 and no changes required.  Family History of Colon CA 1st degree relative <60  Family History Lung cancer  Family History of Cardiovascular disorder  Family History of Leukemia  Family History of Hodgkins  father died of Hodgkin's diseasedisease and leukemia at age 88  mother died age 17, pancreatic cancer  Three brothers, one deceased of colon cancer. Another from lung cancer  third brother died with pancreatic cancer   Social History:  Reviewed history from 05/05/2009 and no changes required.  Occupation:  Married     Review of Systems  Constitutional: Negative.   HENT: Negative for hearing loss, congestion, sore throat, rhinorrhea, dental problem, sinus pressure and tinnitus.   Eyes: Negative for pain, discharge and visual disturbance.  Respiratory: Negative for cough and shortness of breath.   Cardiovascular: Negative for chest pain, palpitations and leg swelling.  Gastrointestinal: Negative for nausea, vomiting, abdominal pain, diarrhea, constipation, blood in stool and abdominal distention.  Genitourinary: Negative for dysuria, urgency, frequency, hematuria, flank pain, vaginal bleeding, vaginal discharge, difficulty urinating, vaginal pain and pelvic pain.  Musculoskeletal: Negative for joint swelling, arthralgias and gait problem.  Skin: Negative for rash.  Neurological: Negative for dizziness, syncope, speech difficulty, weakness, numbness and headaches.  Hematological: Negative for adenopathy.  Psychiatric/Behavioral: Negative for behavioral problems, dysphoric mood and agitation. The patient  is not nervous/anxious.        Objective:   Physical Exam  Constitutional: She is oriented to person, place, and time.  She appears well-developed and well-nourished.  HENT:  Head: Normocephalic and atraumatic.  Right Ear: External ear normal.  Left Ear: External ear normal.  Mouth/Throat: Oropharynx is clear and moist.  Eyes: Conjunctivae normal and EOM are normal.  Neck: Normal range of motion. Neck supple. No JVD present. No thyromegaly present.  Cardiovascular: Normal rate, regular rhythm, normal heart sounds and intact distal pulses.   No murmur heard. Pulmonary/Chest: Effort normal and breath sounds normal. She has no wheezes. She has no rales.  Abdominal: Soft. Bowel sounds are normal. She exhibits no distension and no mass. There is no tenderness. There is no rebound and no guarding.  Musculoskeletal: Normal range of motion. She exhibits no edema and no tenderness.  Neurological: She is alert and oriented to person, place, and time. She has normal reflexes. No cranial nerve deficit. She exhibits normal muscle tone. Coordination normal.  Skin: Skin is warm and dry. No rash noted.  Psychiatric: She has a normal mood and affect. Her behavior is normal.          Assessment & Plan:    Preventive health examination Osteoarthritis Hypertension well controlled History of colonic polyps and family history of colon cancer  We'll check updated labs. Medications were refilled low-salt diet more exercise encouraged we'll recheck one year

## 2012-06-20 ENCOUNTER — Ambulatory Visit (INDEPENDENT_AMBULATORY_CARE_PROVIDER_SITE_OTHER): Payer: Medicare Other | Admitting: Family Medicine

## 2012-06-20 ENCOUNTER — Telehealth: Payer: Self-pay | Admitting: Internal Medicine

## 2012-06-20 ENCOUNTER — Encounter: Payer: Self-pay | Admitting: Family Medicine

## 2012-06-20 VITALS — BP 128/80 | HR 92 | Temp 98.9°F | Wt 158.0 lb

## 2012-06-20 DIAGNOSIS — J069 Acute upper respiratory infection, unspecified: Secondary | ICD-10-CM | POA: Diagnosis not present

## 2012-06-20 DIAGNOSIS — R05 Cough: Secondary | ICD-10-CM | POA: Diagnosis not present

## 2012-06-20 DIAGNOSIS — R059 Cough, unspecified: Secondary | ICD-10-CM

## 2012-06-20 MED ORDER — AZITHROMYCIN 250 MG PO TABS: ORAL_TABLET | ORAL | Status: DC

## 2012-06-20 NOTE — Patient Instructions (Addendum)
-  As we discussed, we have prescribed a new medication (AZITHROMYCIN) for you at this appointment. We discussed the common and serious potential adverse effects of this medication and you can review these and more with the pharmacist when you pick up your medication.  Please follow the instructions for use carefully and notify us immediately if you have any problems taking this medication.  INSTRUCTIONS FOR UPPER RESPIRATORY INFECTION:  -plenty of rest and fluids  -nasal saline wash 2-3 times daily (use prepackaged nasal saline or bottled/distilled water if making your own)   -can use sinex nasal spray for drainage and nasal congestion - but do NOT use longer then 3-4 days  -can use tylenol or ibuprofen as directed for aches and sorethroat  -in the winter time, using a humidifier at night is helpful (please follow cleaning instructions)  -if you are taking a cough medication - use only as directed, may also try a teaspoon of honey to coat the throat and throat lozenges  -for sore throat, salt water gargles can help  -follow up if you have fevers, facial pain, tooth pain, difficulty breathing or are worsening or not getting better in 5-7 days

## 2012-06-20 NOTE — Telephone Encounter (Signed)
Patient Information:  Caller Name: Evita  Phone: [phone]  Patient: Teresa Frey, Teresa Frey  Gender: Female  DOB: [birth year]  Age: 79 Years  PCP: Eleonore Chiquito [facility])  Office Follow Up:  Does the office need to follow up with this patient?: No  Instructions For The Office: N/A   Symptoms  Reason For Call & Symptoms: Patient was in office on 06/11/12 for physicial.  She had a cough/cold at the time.  she has actually been sick since before Christmas. She is now experiencing- +coughing productive- yellow green.  She reports one night of fever on Tuesday 06/17/12 with 100.6 - no fever since  Reviewed Health History In EMR: Yes  Reviewed Medications In EMR: Yes  Reviewed Allergies In EMR: Yes  Reviewed Surgeries / Procedures: No  Date of Onset of Symptoms: 05/26/2012  Treatments Tried: Nyquil, Dayquil  Treatments Tried Worked: No  Guideline(s) Used:  Cough  Disposition Per Guideline:   See Today in Office  Reason For Disposition Reached:   Fever returns after gone for over 24 hours and symptoms worse or not improved  Advice Given:  Reassurance  Coughing is the way that our lungs remove irritants and mucus. It helps protect our lungs from getting pneumonia.  You can get a dry hacking cough after a chest cold. Sometimes this type of cough can last 1-3 weeks, and be worse at night.  You can also get a cough after being exposed to irritating substances like smoke, strong perfumes, and dust.  Here is some care advice that should help.  Cough Medicines:  OTC Cough Drops: Cough drops can help a lot, especially for mild coughs. They reduce coughing by soothing your irritated throat and removing that tickle sensation in the back of the throat. Cough drops also have the advantage of portability - you can carry them with you.  Home Remedy - Hard Candy: Hard candy works just as well as medicine-flavored OTC cough drops. Diabetics should use sugar-free candy.  Home Remedy - Honey:  This old home remedy has been shown to help decrease coughing at night. The adult dosage is 2 teaspoons (10 ml) at bedtime. Honey should not be given to infants under one year of age.  Coughing Spasms:  Drink warm fluids. Inhale warm mist (Reason: both relax the airway and loosen up the phlegm).  Suck on cough drops or hard candy to coat the irritated throat.  Coughing Spasms:  Drink warm fluids. Inhale warm mist (Reason: both relax the airway and loosen up the phlegm).  Suck on cough drops or hard candy to coat the irritated throat.  Prevent Dehydration:  Drink adequate liquids.  This will help soothe an irritated or dry throat and loosen up the phlegm.  Call Back If:  Difficulty breathing  Cough lasts more than 3 weeks  Fever lasts > 3 days  You become worse.  Appointment Scheduled:  06/20/2012 14:00:00 Appointment Scheduled Provider:  Kriste Bas[facility])

## 2012-06-20 NOTE — Progress Notes (Signed)
Chief Complaint  Patient presents with  . Cough    congestion fever     HPI:  Urgent Care Visit for cough and congestion: -started: 3 weeks ago - only here because her kids made her make an appointment -symptoms:nasal congestion, fever, sore throat, cough initially - these resolved but cough has persisted -denies:fever in last week, SOB, NVD, tooth pain -has tried: robitussin and cough drops -sick contacts: great grandaughter and grandaughter -Hx of: PNA remotely - has taken zpack and amoxicillin in the past and tolerated well   ROS: See pertinent positives and negatives per HPI.  Past Medical History  Diagnosis Date  . ANXIETY 04/07/2007  . COLONIC POLYPS, HX OF 04/02/2007    adenomatous  . DIVERTICULITIS, HX OF 04/02/2007  . HYPERTENSION 04/02/2007  . OSTEOARTHRITIS, SEVERE 04/07/2007  . WEIGHT GAIN 11/13/2007  . Paget's disease of bone     lumbar and sacrum  . DJD (degenerative joint disease)   . Hemorrhoids     Family History  Problem Relation Age of Onset  . Lung cancer    . Heart disease Neg Hx     family  . Esophageal cancer Neg Hx   . Rectal cancer Neg Hx   . Stomach cancer Neg Hx   . Colon cancer Brother   . Pancreatic cancer      History   Social History  . Marital Status: Married    Spouse Name: N/A    Number of Children: N/A  . Years of Education: N/A   Social History Main Topics  . Smoking status: Former Smoker    Quit date: 06/04/1978  . Smokeless tobacco: Never Used  . Alcohol Use: 3.5 oz/week    7 drink(s) per week  . Drug Use: No  . Sexually Active: None   Other Topics Concern  . None   Social History Narrative  . None    Current outpatient prescriptions:BIOTIN 5000 PO, Take 1 tablet by mouth daily., Disp: , Rfl: ;  cholecalciferol (VITAMIN D) 1000 UNITS tablet, Take 1,000 Units by mouth daily., Disp: , Rfl: ;  hydrochlorothiazide (HYDRODIURIL) 25 MG tablet, Take 1 tablet (25 mg total) by mouth daily., Disp: 90 tablet, Rfl: 6;   Multiple Vitamins-Minerals (CENTRUM SILVER ADULT 50+) TABS, Take 1 tablet by mouth daily., Disp: , Rfl:  phentermine 30 MG capsule, Take 1 capsule (30 mg total) by mouth every morning., Disp: 90 capsule, Rfl: 2;  sertraline (ZOLOFT) 50 MG tablet, Take 1 tablet (50 mg total) by mouth daily., Disp: 90 tablet, Rfl: 6;  vitamin C (ASCORBIC ACID) 500 MG tablet, Take 1,500 mg by mouth daily., Disp: , Rfl: ;  azithromycin (ZITHROMAX) 250 MG tablet, 2 tabs the first day and then 1 tablet daily for 4 more days, Disp: 6 tablet, Rfl: 0  EXAM:  Filed Vitals:   06/20/12 1407  BP: 128/80  Pulse: 92  Temp: 98.9 F (37.2 C)    There is no height on file to calculate BMI.  GENERAL: vitals reviewed and listed above, alert, oriented, appears well hydrated and in no acute distress  HEENT: atraumatic, conjunttiva clear, no obvious abnormalities on inspection of external nose and ears, normal appearance of ear canals and TMs, clear nasal congestion, mild post oropharyngeal erythema with PND, no tonsillar edema or exudate, no sinus TTP  NECK: no obvious masses on inspection  LUNGS: clear to auscultation bilaterally, no wheezes, rales or rhonchi, good air movement  CV: HRRR, no peripheral edema  MS: moves all  extremities without noticeable abnormality  PSYCH: pleasant and cooperative, no obvious depression or anxiety  ASSESSMENT AND PLAN:  Discussed the following assessment and plan:  1. Cough  azithromycin (ZITHROMAX) 250 MG tablet  2. Upper respiratory infection  azithromycin (ZITHROMAX) 250 MG tablet   -likley viral, possibly bacterial upper resp inf versus mild atyipcal pna - lung exam normal. Discussed risks benefits of abx and she would like to do zpack (discussed risks).  -Patient advised to return or notify a doctor immediately if symptoms worsen or persist or new concerns arise.  Patient Instructions  -As we discussed, we have prescribed a new medication (AZITHROMYCIN) for you at this  appointment. We discussed the common and serious potential adverse effects of this medication and you can review these and more with the pharmacist when you pick up your medication.  Please follow the instructions for use carefully and notify us immediately if you have any problems taking this medication.  INSTRUCTIONS FOR UPPER RESPIRATORY INFECTION:  -plenty of rest and fluids  -nasal saline wash 2-3 times daily (use prepackaged nasal saline or bottled/distilled water if making your own)   -can use sinex nasal spray for drainage and nasal congestion - but do NOT use longer then 3-4 days  -can use tylenol or ibuprofen as directed for aches and sorethroat  -in the winter time, using a humidifier at night is helpful (please follow cleaning instructions)  -if you are taking a cough medication - use only as directed, may also try a teaspoon of honey to coat the throat and throat lozenges  -for sore throat, salt water gargles can help  -follow up if you have fevers, facial pain, tooth pain, difficulty breathing or are worsening or not getting better in 5-7 days       Junita Kubota R.

## 2012-08-07 ENCOUNTER — Other Ambulatory Visit: Payer: Self-pay | Admitting: Internal Medicine

## 2012-08-15 ENCOUNTER — Other Ambulatory Visit: Payer: Self-pay | Admitting: *Deleted

## 2012-08-15 MED ORDER — SERTRALINE HCL 50 MG PO TABS: 50.0000 mg | ORAL_TABLET | Freq: Every day | ORAL | Status: DC

## 2012-08-15 NOTE — Telephone Encounter (Signed)
Left message on voicemail. Rx was sent to [facility].

## 2012-09-15 DIAGNOSIS — L219 Seborrheic dermatitis, unspecified: Secondary | ICD-10-CM | POA: Diagnosis not present

## 2012-12-23 DIAGNOSIS — Z1231 Encounter for screening mammogram for malignant neoplasm of breast: Secondary | ICD-10-CM | POA: Diagnosis not present

## 2012-12-29 ENCOUNTER — Telehealth: Payer: Self-pay | Admitting: *Deleted

## 2012-12-29 MED ORDER — PHENTERMINE HCL 30 MG PO CAPS: 30.0000 mg | ORAL_CAPSULE | ORAL | Status: DC

## 2012-12-29 NOTE — Telephone Encounter (Signed)
Received refill request via fax from pharmacy for Phentermine 30 mg capsule. Please advise if okay to refill?

## 2012-12-29 NOTE — Telephone Encounter (Signed)
30

## 2013-01-09 ENCOUNTER — Encounter: Payer: Self-pay | Admitting: Internal Medicine

## 2013-03-30 DIAGNOSIS — Z23 Encounter for immunization: Secondary | ICD-10-CM | POA: Diagnosis not present

## 2013-03-30 DIAGNOSIS — M25559 Pain in unspecified hip: Secondary | ICD-10-CM | POA: Diagnosis not present

## 2013-05-06 DIAGNOSIS — L219 Seborrheic dermatitis, unspecified: Secondary | ICD-10-CM | POA: Diagnosis not present

## 2013-05-06 DIAGNOSIS — L820 Inflamed seborrheic keratosis: Secondary | ICD-10-CM | POA: Diagnosis not present

## 2013-06-12 ENCOUNTER — Ambulatory Visit (INDEPENDENT_AMBULATORY_CARE_PROVIDER_SITE_OTHER): Payer: Medicare Other | Admitting: Internal Medicine

## 2013-06-12 ENCOUNTER — Encounter: Payer: Self-pay | Admitting: Internal Medicine

## 2013-06-12 VITALS — BP 146/80 | HR 65 | Temp 97.9°F | Resp 20 | Ht 63.25 in | Wt 158.0 lb

## 2013-06-12 DIAGNOSIS — Z23 Encounter for immunization: Secondary | ICD-10-CM

## 2013-06-12 DIAGNOSIS — Z86010 Personal history of colonic polyps: Secondary | ICD-10-CM | POA: Diagnosis not present

## 2013-06-12 DIAGNOSIS — I10 Essential (primary) hypertension: Secondary | ICD-10-CM

## 2013-06-12 DIAGNOSIS — Z0000 Encounter for general adult medical examination without abnormal findings: Secondary | ICD-10-CM

## 2013-06-12 DIAGNOSIS — Z8719 Personal history of other diseases of the digestive system: Secondary | ICD-10-CM | POA: Diagnosis not present

## 2013-06-12 DIAGNOSIS — E785 Hyperlipidemia, unspecified: Secondary | ICD-10-CM | POA: Diagnosis not present

## 2013-06-12 DIAGNOSIS — M1990 Unspecified osteoarthritis, unspecified site: Secondary | ICD-10-CM

## 2013-06-12 LAB — CBC WITH DIFFERENTIAL/PLATELET
BASOS ABS: 0 10*3/uL (ref 0.0–0.1)
Basophils Relative: 0.2 % (ref 0.0–3.0)
Eosinophils Absolute: 0.1 10*3/uL (ref 0.0–0.7)
Eosinophils Relative: 1 % (ref 0.0–5.0)
HEMATOCRIT: 45.7 % (ref 36.0–46.0)
Hemoglobin: 15.3 g/dL — ABNORMAL HIGH (ref 12.0–15.0)
LYMPHS ABS: 2.6 10*3/uL (ref 0.7–4.0)
Lymphocytes Relative: 28.7 % (ref 12.0–46.0)
MCHC: 33.4 g/dL (ref 30.0–36.0)
MCV: 90.7 fl (ref 78.0–100.0)
MONO ABS: 0.5 10*3/uL (ref 0.1–1.0)
Monocytes Relative: 6.1 % (ref 3.0–12.0)
Neutro Abs: 5.7 10*3/uL (ref 1.4–7.7)
Neutrophils Relative %: 64 % (ref 43.0–77.0)
PLATELETS: 239 10*3/uL (ref 150.0–400.0)
RBC: 5.04 Mil/uL (ref 3.87–5.11)
RDW: 13.7 % (ref 11.5–14.6)
WBC: 8.9 10*3/uL (ref 4.5–10.5)

## 2013-06-12 LAB — LIPID PANEL
CHOL/HDL RATIO: 4
Cholesterol: 203 mg/dL — ABNORMAL HIGH (ref 0–200)
HDL: 49 mg/dL (ref >39.00)
TRIGLYCERIDES: 104 mg/dL (ref 0.0–149.0)
VLDL: 20.8 mg/dL (ref 0.0–40.0)

## 2013-06-12 LAB — COMPREHENSIVE METABOLIC PANEL
ALK PHOS: 178 U/L — AB (ref 39–117)
ALT: 16 U/L (ref 0–35)
AST: 19 U/L (ref 0–37)
Albumin: 4 g/dL (ref 3.5–5.2)
BILIRUBIN TOTAL: 0.7 mg/dL (ref 0.3–1.2)
BUN: 18 mg/dL (ref 6–23)
CO2: 30 mEq/L (ref 19–32)
Calcium: 9.8 mg/dL (ref 8.4–10.5)
Chloride: 107 mEq/L (ref 96–112)
Creatinine, Ser: 0.7 mg/dL (ref 0.4–1.2)
GFR: 86.62 mL/min (ref >60.00)
Glucose, Bld: 106 mg/dL — ABNORMAL HIGH (ref 70–99)
Potassium: 4.1 mEq/L (ref 3.5–5.1)
SODIUM: 144 meq/L (ref 135–145)
TOTAL PROTEIN: 7.1 g/dL (ref 6.0–8.3)

## 2013-06-12 LAB — LDL CHOLESTEROL, DIRECT: Direct LDL: 126.1 mg/dL

## 2013-06-12 LAB — TSH: TSH: 1.33 u[IU]/mL (ref 0.35–5.50)

## 2013-06-12 MED ORDER — PHENTERMINE HCL 30 MG PO CAPS: 30.0000 mg | ORAL_CAPSULE | ORAL | Status: DC

## 2013-06-12 NOTE — Progress Notes (Signed)
Patient ID: Teresa Frey, female   DOB: August 15, 1929, 80 y.o.   MRN: [id number]  Subjective:    Patient ID: Teresa Frey, female    DOB: [birth year], 80 y.o.   MRN: [id number]  HPI  CC: cpx - doing well.  History of Present Illness:   77  -year-old patient who is seen today for a preventive health examination. Medical problems include treated hypertension, as well as osteoarthritis. She is followed closely by GI to do a personal history of colonic polyps and a family history of colon cancer. She is doing well today.   Here for Medicare AWV:   1. Risk factors based on Past M, S, F history: cardiac risk factors include hypertension only  2. Physical Activities: remains fairly active, although limited somewhat by osteoarthritis  3. Depression/mood- history of mild depression, which has been quite stable on Zoloft  4. Hearing: no hearing deficits  5. ADL's: independent in all aspects of daily living  6. Fall Risk: low  7. Home Safety: no problems identified  8. Height, weight, &visual acuity:height and weight stable. No change in visual acuity  9. Counseling: heart healthy diet regular. Exercise encouraged  10. Labs ordered based on risk factors: laboratory profile, including lipid panel will be reviewed  11. Referral Coordination- none appropriate at this time; will be followed colonoscopy Spring 2013  12. Care Plan- heart healthy diet modest weight loss encouraged; calcium supplementation encouraged  13. Cognitive Assessment- alert and oriented, with normal affect. No cognitive dysfunction, handles all executive functions without difficulty   Allergies (verified):  No Known Drug Allergies   Past History:  Past Medical History:   DJD  Colonic polyps, hx of  Diverticulitis, hx of  Hypertension  Anxiety  Paget's disease, lumbar spine and sacrum   Past Surgical History:   Appendectomy  Total hip replacement left 1989, with revision in 1999; status post right total hip replacement  surgery in March 2006  Tubal ligation  Tonsillectomy  colonoscopy May 2007, May 2010  June 2013  Family History:   Family History of Colon CA 1st degree relative <60  Family History Lung cancer  Family History of Cardiovascular disorder  Family History of Leukemia  Family History of Hodgkins  father died of Hodgkin's diseasedisease and leukemia at age 61  mother died age 106, pancreatic cancer  Three brothers, one deceased of colon cancer. Another from lung cancer  third brother died with pancreatic cancer   Social History:   Occupation:  Married     Review of Systems  Constitutional: Negative.   HENT: Negative for congestion, dental problem, hearing loss, rhinorrhea, sinus pressure, sore throat and tinnitus.   Eyes: Negative for pain, discharge and visual disturbance.  Respiratory: Negative for cough and shortness of breath.   Cardiovascular: Negative for chest pain, palpitations and leg swelling.  Gastrointestinal: Negative for nausea, vomiting, abdominal pain, diarrhea, constipation, blood in stool and abdominal distention.  Genitourinary: Negative for dysuria, urgency, frequency, hematuria, flank pain, vaginal bleeding, vaginal discharge, difficulty urinating, vaginal pain and pelvic pain.  Musculoskeletal: Negative for arthralgias, gait problem and joint swelling.  Skin: Negative for rash.  Neurological: Negative for dizziness, syncope, speech difficulty, weakness, numbness and headaches.  Hematological: Negative for adenopathy.  Psychiatric/Behavioral: Negative for behavioral problems, dysphoric mood and agitation. The patient is not nervous/anxious.        Objective:   Physical Exam  Constitutional: She is oriented to person, place, and time. She appears well-developed and well-nourished.  HENT:  Head: Normocephalic and atraumatic.  Right Ear: External ear normal.  Left Ear: External ear normal.  Mouth/Throat: Oropharynx is clear and moist.  Eyes: Conjunctivae  and EOM are normal.  Neck: Normal range of motion. Neck supple. No JVD present. No thyromegaly present.  Cardiovascular: Normal rate, regular rhythm and normal heart sounds.   No murmur heard. The left posterior tibial pulse was full. Other pedal pulses not easily palpable  Pulmonary/Chest: Effort normal and breath sounds normal. She has no wheezes. She has no rales.  Abdominal: Soft. Bowel sounds are normal. She exhibits no distension and no mass. There is no tenderness. There is no rebound and no guarding.  Musculoskeletal: Normal range of motion. She exhibits no edema and no tenderness.  Neurological: She is alert and oriented to person, place, and time. She has normal reflexes. No cranial nerve deficit. She exhibits normal muscle tone. Coordination normal.  Skin: Skin is warm and dry. No rash noted.  Psychiatric: She has a normal mood and affect. Her behavior is normal.          Assessment & Plan:    Preventive health examination Osteoarthritis Hypertension well controlled History of colonic polyps and family history of colon cancer  We'll check updated labs. Medications were refilled low-salt diet more exercise encouraged we'll recheck one year

## 2013-06-12 NOTE — Progress Notes (Signed)
Pre-visit discussion using our clinic review tool. No additional management support is needed unless otherwise documented below in the visit note.  

## 2013-06-12 NOTE — Patient Instructions (Signed)
Limit your sodium (Salt) intake  Take a calcium supplement, plus 800-1200 units of vitamin D    It is important that you exercise regularly, at least 20 minutes 3 to 4 times per week.  If you develop chest pain or shortness of breath seek  medical attention.  Return in one year for follow-up   

## 2013-06-24 ENCOUNTER — Telehealth: Payer: Self-pay | Admitting: Internal Medicine

## 2013-06-24 NOTE — Telephone Encounter (Signed)
Pt would like you to call rx for the shingles vacc to [facility] [address]  [phone]

## 2013-06-25 MED ORDER — ZOSTER VACCINE LIVE 19400 UNT/0.65ML ~~LOC~~ SOLR: 0.6500 mL | Freq: Once | SUBCUTANEOUS | Status: DC

## 2013-06-25 NOTE — Telephone Encounter (Signed)
Pt notified Rx sent to pharmacy for Shingles Vaccine.

## 2013-07-30 ENCOUNTER — Emergency Department (HOSPITAL_COMMUNITY): Payer: Medicare Other

## 2013-07-30 ENCOUNTER — Emergency Department (HOSPITAL_COMMUNITY)
Admission: EM | Admit: 2013-07-30 | Discharge: 2013-07-30 | Disposition: A | Discharge status: Home / Self Care | Payer: Medicare Other | Attending: Emergency Medicine | Admitting: Emergency Medicine

## 2013-07-30 ENCOUNTER — Encounter (HOSPITAL_COMMUNITY): Payer: Self-pay | Admitting: Emergency Medicine

## 2013-07-30 DIAGNOSIS — I10 Essential (primary) hypertension: Secondary | ICD-10-CM | POA: Insufficient documentation

## 2013-07-30 DIAGNOSIS — R059 Cough, unspecified: Secondary | ICD-10-CM | POA: Diagnosis not present

## 2013-07-30 DIAGNOSIS — R110 Nausea: Secondary | ICD-10-CM | POA: Diagnosis not present

## 2013-07-30 DIAGNOSIS — R630 Anorexia: Secondary | ICD-10-CM | POA: Diagnosis not present

## 2013-07-30 DIAGNOSIS — Z8719 Personal history of other diseases of the digestive system: Secondary | ICD-10-CM | POA: Insufficient documentation

## 2013-07-30 DIAGNOSIS — Z79899 Other long term (current) drug therapy: Secondary | ICD-10-CM | POA: Insufficient documentation

## 2013-07-30 DIAGNOSIS — J209 Acute bronchitis, unspecified: Secondary | ICD-10-CM | POA: Insufficient documentation

## 2013-07-30 DIAGNOSIS — J40 Bronchitis, not specified as acute or chronic: Secondary | ICD-10-CM

## 2013-07-30 DIAGNOSIS — F411 Generalized anxiety disorder: Secondary | ICD-10-CM | POA: Insufficient documentation

## 2013-07-30 DIAGNOSIS — Z87891 Personal history of nicotine dependence: Secondary | ICD-10-CM | POA: Insufficient documentation

## 2013-07-30 DIAGNOSIS — Z8739 Personal history of other diseases of the musculoskeletal system and connective tissue: Secondary | ICD-10-CM | POA: Diagnosis not present

## 2013-07-30 DIAGNOSIS — Z860100 Personal history of colon polyps, unspecified: Secondary | ICD-10-CM | POA: Insufficient documentation

## 2013-07-30 DIAGNOSIS — R05 Cough: Secondary | ICD-10-CM | POA: Diagnosis not present

## 2013-07-30 MED ORDER — ALBUTEROL SULFATE HFA 108 (90 BASE) MCG/ACT IN AERS
2.0000 | INHALATION_SPRAY | RESPIRATORY_TRACT | Status: DC | PRN
  Administered 2013-07-30: 2 via RESPIRATORY_TRACT
  Filled 2013-07-30: qty 6.7

## 2013-07-30 MED ORDER — AZITHROMYCIN 250 MG PO TABS
500.0000 mg | ORAL_TABLET | Freq: Once | ORAL | Status: AC
  Administered 2013-07-30: 500 mg via ORAL
  Filled 2013-07-30: qty 2

## 2013-07-30 MED ORDER — AZITHROMYCIN 250 MG PO TABS: 250.0000 mg | ORAL_TABLET | Freq: Every day | ORAL | Status: DC

## 2013-07-30 NOTE — ED Provider Notes (Signed)
CSN: [id number]     Arrival date & time 07/30/13  2102 History   First MD Initiated Contact with Patient 07/30/13 2141     Chief Complaint  Patient presents with  . Sore Throat  . Nasal Congestion     (Consider location/radiation/quality/duration/timing/severity/associated sxs/prior Treatment) HPI Pt is an 80yo female with hx of HTN presenting to ED c/o sore throat and congestion with cough since last Monday, 2/16. Pt reports feeling hot and cold chills but never measured her temperature. Reports throat pain is worse with swallowing but denies difficulty breathing or keeping down solids or liquids. Pt reports moderate productive cough. Symptoms not improved with Mucinex. Chest pain is 8/10 with cough, feels like tightness.  Denies vomiting or diarrhea. Denies abdominal pain. Denies urinary symptoms. No hx of asthma, COPD or known heart disease. Pt's husband is also being seen in ED for failure to thrive.     Past Medical History  Diagnosis Date  . ANXIETY 04/07/2007  . COLONIC POLYPS, HX OF 04/02/2007    adenomatous  . DIVERTICULITIS, HX OF 04/02/2007  . HYPERTENSION 04/02/2007  . OSTEOARTHRITIS, SEVERE 04/07/2007  . WEIGHT GAIN 11/13/2007  . Paget's disease of bone     lumbar and sacrum  . DJD (degenerative joint disease)   . Hemorrhoids    Past Surgical History  Procedure Laterality Date  . Appendectomy    . Tubal ligation    . Tonsillectomy    . Total hip arthroplasty  1999, left hip     2006 right hip   Family History  Problem Relation Age of Onset  . Lung cancer    . Heart disease Neg Hx     family  . Esophageal cancer Neg Hx   . Rectal cancer Neg Hx   . Stomach cancer Neg Hx   . Colon cancer Brother   . Pancreatic cancer     History  Substance Use Topics  . Smoking status: Former Smoker    Quit date: 06/04/1978  . Smokeless tobacco: Never Used  . Alcohol Use: 4.2 oz/week    7 Glasses of wine per week   OB History   Grav Para Term Preterm Abortions TAB SAB  Ect Mult Living                 Review of Systems  Constitutional: Positive for fever ( subjective), chills, appetite change and fatigue. Negative for diaphoresis.  HENT: Positive for congestion.   Respiratory: Positive for cough and shortness of breath.   Cardiovascular: Positive for chest pain ( with cough). Negative for palpitations and leg swelling.  Gastrointestinal: Positive for nausea. Negative for vomiting, abdominal pain, diarrhea and constipation.  Musculoskeletal: Negative for back pain.  All other systems reviewed and are negative.      Allergies  Review of patient's allergies indicates no known allergies.  Home Medications   Current Outpatient Rx  Name  Route  Sig  Dispense  Refill  . BIOTIN 5000 PO   Oral   Take 1 tablet by mouth daily.         Marland Kitchen guaiFENesin (MUCINEX) 600 MG 12 hr tablet   Oral   Take 600 mg by mouth 2 (two) times daily.         . hydrochlorothiazide (HYDRODIURIL) 25 MG tablet   Oral   Take 1 tablet (25 mg total) by mouth daily.   90 tablet   6   . Multiple Vitamins-Minerals (CENTRUM SILVER ADULT 50+) TABS   Oral  Take 1 tablet by mouth daily.         . phentermine 30 MG capsule   Oral   Take 1 capsule (30 mg total) by mouth every morning.   60 capsule   2   . sertraline (ZOLOFT) 50 MG tablet   Oral   Take 1 tablet (50 mg total) by mouth daily.   90 tablet   3   . vitamin C (ASCORBIC ACID) 500 MG tablet   Oral   Take 1,500 mg by mouth daily.         Marland Kitchen azithromycin (ZITHROMAX) 250 MG tablet   Oral   Take 1 tablet (250 mg total) by mouth daily. Take 1 pill by mouth daily.   4 tablet   0    BP 134/67  Pulse 89  Temp(Src) 99.9 F (37.7 C) (Oral)  Resp 18  SpO2 92% Physical Exam  Nursing note and vitals reviewed. Constitutional: She appears well-developed and well-nourished. No distress.  Pt appears well, non-toxic, NAD.  HENT:  Head: Normocephalic and atraumatic.  Right Ear: Hearing, tympanic membrane,  external ear and ear canal normal.  Left Ear: Hearing, tympanic membrane, external ear and ear canal normal.  Nose: Mucosal edema present.  Mouth/Throat: Uvula is midline and mucous membranes are normal. Posterior oropharyngeal erythema present. No oropharyngeal exudate, posterior oropharyngeal edema or tonsillar abscesses.  Eyes: Conjunctivae are normal. No scleral icterus.  Neck: Normal range of motion. Neck supple.  Cardiovascular: Normal rate, regular rhythm and normal heart sounds.   Pulmonary/Chest: Effort normal. No respiratory distress. She has no wheezes. She has rales. She exhibits no tenderness.  No respiratory distress, able to speak in full sentences w/o difficulty. Lungs: mild diffuse rhonchi.  Abdominal: Soft. Bowel sounds are normal. She exhibits no distension and no mass. There is no tenderness. There is no rebound and no guarding.  Musculoskeletal: Normal range of motion.  Neurological: She is alert.  Skin: Skin is warm and dry. She is not diaphoretic.    ED Course  Procedures (including critical care time) Labs Review Labs Reviewed - No data to display Imaging Review Dg Chest 2 View  07/30/2013   CLINICAL DATA:  Cough and fever.  EXAM: CHEST  2 VIEW  COMPARISON:  08/10/2004  FINDINGS: The cardiomediastinal silhouette is unremarkable.  Mild peribronchial thickening again noted.  There is no evidence of focal airspace disease, pulmonary edema, suspicious pulmonary nodule/mass, pleural effusion, or pneumothorax. No acute bony abnormalities are identified.  IMPRESSION: No active cardiopulmonary disease.   Electronically Signed   By: Hassan Rowan M.D.   On: 07/30/2013 22:43    EKG Interpretation   None       MDM   Final diagnoses:  Bronchitis    Pt presenting with cough, congestion. Appears well, non-toxic. No respiratory distress.  DDx: sinusitis, pneumonia, influenza, bronchitis. Not concerned for ACS or PE.  No hx of asthma or COPD.  CXR: no active cardiopulmonary  disease   Discussed pt with Dr. Vanita Panda.  Due to duration of symptoms will tx for bronchitis.  First dose of azithromycin given in ED. Rx: azithromycin and albuterol inhaler. Advised pt to f/u with PCP next week. Return precautions provided. Pt verbalized understanding and agreement with tx plan.       Noland Fordyce, PA-C 07/31/13 [phone]

## 2013-07-30 NOTE — Discharge Instructions (Signed)
Bronchitis °Bronchitis is swelling (inflammation) of the air tubes leading to your lungs (bronchi). This causes mucus and a cough. If the swelling gets bad, you may have trouble breathing. °HOME CARE  °· Rest. °· Drink enough fluids to keep your pee (urine) clear or pale yellow (unless you have a condition where you have to watch how much you drink). °· Only take medicine as told by your doctor. If you were given antibiotic medicines, finish them even if you start to feel better. °· Avoid smoke, irritating chemicals, and strong smells. These make the problem worse. Quit smoking if you smoke. This helps your lungs heal faster. °· Use a cool mist humidifier. Change the water in the humidifier every day. You can also sit in the bathroom with hot shower running for 5 10 minutes. Keep the door closed. °· See your health care provider as told. °· Wash your hands often. °GET HELP IF: °Your problems do not get better after 1 week. °GET HELP RIGHT AWAY IF:  °· Your fever gets worse. °· You have chills. °· Your chest hurts. °· Your problems breathing get worse. °· You have blood in your mucus. °· You pass out (faint). °· You feel lightheaded. °· You have a bad headache. °· You throw up (vomit) again and again. °MAKE SURE YOU: °· Understand these instructions. °· Will watch your condition. °· Will get help right away if you are not doing well or get worse. °Document Released: 11/07/2007 Document Revised: 03/11/2013 Document Reviewed: 01/13/2013 °[employer]® Patient Information ©2014 [employer]. ° °

## 2013-07-30 NOTE — ED Notes (Signed)
Pt here with husband who is in room 20, she complains of a sore throat and chest congestion and would like to be evaluated

## 2013-08-01 NOTE — ED Provider Notes (Signed)
  This was a shared visit with a mid-level provided (NP or PA).  Throughout the patient's course I was available for consultation/collaboration.  I saw the relevant studies - I agree with the interpretation.  On my exam the patient was in no distress.  I evaluated the patient's husband for an unrelated illness.  She requested evaluation for persistent cough, sore throat, congestion.  Patient was in no distress, with no hypoxia, fever, no x-ray evidence of pneumonia.  With the persistency of the cough she was treated for presumed bronchitis.  She was discharged in stable condition.      Carmin Muskrat, MD 08/01/13 [phone]

## 2013-08-25 ENCOUNTER — Other Ambulatory Visit: Payer: Self-pay | Admitting: Internal Medicine

## 2013-09-24 ENCOUNTER — Other Ambulatory Visit: Payer: Self-pay | Admitting: Internal Medicine

## 2013-10-15 DIAGNOSIS — M25559 Pain in unspecified hip: Secondary | ICD-10-CM | POA: Diagnosis not present

## 2013-10-15 DIAGNOSIS — M545 Low back pain, unspecified: Secondary | ICD-10-CM | POA: Diagnosis not present

## 2013-10-16 ENCOUNTER — Other Ambulatory Visit: Payer: Self-pay | Admitting: Orthopedic Surgery

## 2013-10-16 DIAGNOSIS — M545 Low back pain, unspecified: Secondary | ICD-10-CM

## 2013-10-16 DIAGNOSIS — M25552 Pain in left hip: Secondary | ICD-10-CM

## 2013-10-24 ENCOUNTER — Other Ambulatory Visit: Payer: Medicare Other

## 2013-10-24 ENCOUNTER — Ambulatory Visit
Admission: RE | Admit: 2013-10-24 | Discharge: 2013-10-24 | Disposition: A | Discharge status: Home / Self Care | Payer: Medicare Other | Source: Ambulatory Visit | Attending: Orthopedic Surgery | Admitting: Orthopedic Surgery

## 2013-10-24 DIAGNOSIS — M25552 Pain in left hip: Secondary | ICD-10-CM

## 2013-10-24 DIAGNOSIS — M25559 Pain in unspecified hip: Secondary | ICD-10-CM | POA: Diagnosis not present

## 2013-10-24 DIAGNOSIS — M545 Low back pain, unspecified: Secondary | ICD-10-CM

## 2013-10-24 DIAGNOSIS — Z96649 Presence of unspecified artificial hip joint: Secondary | ICD-10-CM | POA: Diagnosis not present

## 2013-10-24 DIAGNOSIS — M5137 Other intervertebral disc degeneration, lumbosacral region: Secondary | ICD-10-CM | POA: Diagnosis not present

## 2013-10-29 DIAGNOSIS — M25559 Pain in unspecified hip: Secondary | ICD-10-CM | POA: Diagnosis not present

## 2013-10-30 DIAGNOSIS — H43819 Vitreous degeneration, unspecified eye: Secondary | ICD-10-CM | POA: Diagnosis not present

## 2013-10-30 DIAGNOSIS — H02059 Trichiasis without entropian unspecified eye, unspecified eyelid: Secondary | ICD-10-CM | POA: Diagnosis not present

## 2013-10-30 DIAGNOSIS — H01009 Unspecified blepharitis unspecified eye, unspecified eyelid: Secondary | ICD-10-CM | POA: Diagnosis not present

## 2013-10-30 DIAGNOSIS — H2640 Unspecified secondary cataract: Secondary | ICD-10-CM | POA: Diagnosis not present

## 2013-12-25 DIAGNOSIS — Z1231 Encounter for screening mammogram for malignant neoplasm of breast: Secondary | ICD-10-CM | POA: Diagnosis not present

## 2013-12-25 LAB — HM MAMMOGRAPHY

## 2014-03-19 ENCOUNTER — Other Ambulatory Visit: Payer: Self-pay

## 2014-04-07 ENCOUNTER — Ambulatory Visit: Payer: Medicare Other | Admitting: *Deleted

## 2014-04-07 ENCOUNTER — Ambulatory Visit (INDEPENDENT_AMBULATORY_CARE_PROVIDER_SITE_OTHER): Payer: Medicare Other | Admitting: *Deleted

## 2014-04-07 DIAGNOSIS — Z23 Encounter for immunization: Secondary | ICD-10-CM

## 2014-07-12 ENCOUNTER — Ambulatory Visit (INDEPENDENT_AMBULATORY_CARE_PROVIDER_SITE_OTHER): Payer: Medicare Other | Admitting: Internal Medicine

## 2014-07-12 ENCOUNTER — Encounter: Payer: Self-pay | Admitting: Internal Medicine

## 2014-07-12 VITALS — BP 124/70 | HR 69 | Temp 98.1°F | Resp 20 | Ht 63.25 in | Wt 155.0 lb

## 2014-07-12 DIAGNOSIS — I10 Essential (primary) hypertension: Secondary | ICD-10-CM

## 2014-07-12 DIAGNOSIS — J069 Acute upper respiratory infection, unspecified: Secondary | ICD-10-CM

## 2014-07-12 DIAGNOSIS — B9789 Other viral agents as the cause of diseases classified elsewhere: Secondary | ICD-10-CM

## 2014-07-12 NOTE — Progress Notes (Signed)
Pre visit review using our clinic review tool, if applicable. No additional management support is needed unless otherwise documented below in the visit note. 

## 2014-07-12 NOTE — Progress Notes (Signed)
Subjective:    Patient ID: Teresa Frey, female    DOB: 1929-10-31, 81 y.o.   MRN: [id number]  HPI 81 year old patient who has a history of essential hypertension.  She is seen here today with a four-day history of chest congestion and cough.  Cough has been productive of some yellow to green sputum.  No wheezing, shortness of breath, fever or chills.  She has a husband who is elderly and she was concerned about spreading her disease.  She has been taking Mucinex.  Past Medical History  Diagnosis Date  . ANXIETY 04/07/2007  . COLONIC POLYPS, HX OF 04/02/2007    adenomatous  . DIVERTICULITIS, HX OF 04/02/2007  . HYPERTENSION 04/02/2007  . OSTEOARTHRITIS, SEVERE 04/07/2007  . WEIGHT GAIN 11/13/2007  . Paget's disease of bone     lumbar and sacrum  . DJD (degenerative joint disease)   . Hemorrhoids     History   Social History  . Marital Status: Married    Spouse Name: N/A    Number of Children: N/A  . Years of Education: N/A   Occupational History  . Not on file.   Social History Main Topics  . Smoking status: Former Smoker    Quit date: 06/04/1978  . Smokeless tobacco: Never Used  . Alcohol Use: 4.2 oz/week    7 Glasses of wine per week  . Drug Use: No  . Sexual Activity: Not on file   Other Topics Concern  . Not on file   Social History Narrative    Past Surgical History  Procedure Laterality Date  . Appendectomy    . Tubal ligation    . Tonsillectomy    . Total hip arthroplasty  1999, left hip     2006 right hip    Family History  Problem Relation Age of Onset  . Lung cancer    . Heart disease Neg Hx     family  . Esophageal cancer Neg Hx   . Rectal cancer Neg Hx   . Stomach cancer Neg Hx   . Colon cancer Brother   . Pancreatic cancer      No Known Allergies  Current Outpatient Prescriptions on File Prior to Visit  Medication Sig Dispense Refill  . BIOTIN 5000 PO Take 1 tablet by mouth daily.    Marland Kitchen guaiFENesin (MUCINEX) 600 MG 12 hr tablet  Take 600 mg by mouth 2 (two) times daily.    . hydrochlorothiazide (HYDRODIURIL) 25 MG tablet TAKE 1 TABLET BY MOUTH DAILY. 90 tablet 3  . Multiple Vitamins-Minerals (CENTRUM SILVER ADULT 50+) TABS Take 1 tablet by mouth daily.    . phentermine 30 MG capsule Take 1 capsule (30 mg total) by mouth every morning. 60 capsule 2  . sertraline (ZOLOFT) 50 MG tablet TAKE 1 TABLET BY MOUTH DAILY. 90 tablet 3  . vitamin C (ASCORBIC ACID) 500 MG tablet Take 1,500 mg by mouth daily.     No current facility-administered medications on file prior to visit.    BP 124/70 mmHg  Pulse 69  Temp(Src) 98.1 F (36.7 C) (Oral)  Resp 20  Ht 5' 3.25" (1.607 m)  Wt 155 lb (70.308 kg)  BMI 27.23 kg/m2  SpO2 96%      Review of Systems  Constitutional: Positive for fatigue.  HENT: Positive for postnasal drip, sinus pressure and sore throat. Negative for congestion, dental problem, hearing loss, rhinorrhea and tinnitus.   Eyes: Negative for pain, discharge and visual disturbance.  Respiratory:  Positive for cough. Negative for shortness of breath.   Cardiovascular: Negative for chest pain, palpitations and leg swelling.  Gastrointestinal: Negative for nausea, vomiting, abdominal pain, diarrhea, constipation, blood in stool and abdominal distention.  Genitourinary: Negative for dysuria, urgency, frequency, hematuria, flank pain, vaginal bleeding, vaginal discharge, difficulty urinating, vaginal pain and pelvic pain.  Musculoskeletal: Negative for joint swelling, arthralgias and gait problem.  Skin: Negative for rash.  Neurological: Negative for dizziness, syncope, speech difficulty, weakness, numbness and headaches.  Hematological: Negative for adenopathy.  Psychiatric/Behavioral: Negative for behavioral problems, dysphoric mood and agitation. The patient is not nervous/anxious.        Objective:   Physical Exam  Constitutional: She is oriented to person, place, and time. She appears well-developed and  well-nourished.  HENT:  Head: Normocephalic.  Right Ear: External ear normal.  Left Ear: External ear normal.  Mild erythema of the oropharynx  Healed perforation.  The right TM  Eyes: Conjunctivae and EOM are normal. Pupils are equal, round, and reactive to light.  Neck: Normal range of motion. Neck supple. No thyromegaly present.  Cardiovascular: Normal rate, regular rhythm, normal heart sounds and intact distal pulses.   Pulmonary/Chest: Effort normal and breath sounds normal.  A few crackles right base No tachypnea No fever O2 saturation 96  Abdominal: Soft. Bowel sounds are normal. She exhibits no mass. There is no tenderness.  Musculoskeletal: Normal range of motion.  Lymphadenopathy:    She has no cervical adenopathy.  Neurological: She is alert and oriented to person, place, and time.  Skin: Skin is warm and dry. No rash noted.  Psychiatric: She has a normal mood and affect. Her behavior is normal.          Assessment & Plan:   Viral URI with cough.  Will treat symptomatically Hypertension, well-controlled.  No change in therapy

## 2014-07-12 NOTE — Patient Instructions (Signed)
Acute bronchitis symptoms for less than 10 days are generally not helped by antibiotics.  Take over-the-counter expectorants and cough medications such as  Mucinex DM.  Call if there is no improvement in 5 to 7 days or if  you develop worsening cough, fever, or new symptoms, such as shortness of breath or chest pain.  HOME CARE INSTRUCTIONS  Get plenty of rest.  Drink enough fluids to keep your urine clear or pale yellow (unless you have a medical condition that requires fluid restriction). Increasing fluids may help thin your respiratory secretions (sputum) and reduce chest congestion, and it will prevent dehydration.  Take medicines only as directed by your health care provider.  Reduce the chances of another bout of acute bronchitis by washing your hands frequently, avoiding people with cold symptoms, and trying not to touch your hands to your mouth, nose, or eyes.  Keep all follow-up visits as directed by your health care provider.

## 2014-07-13 ENCOUNTER — Telehealth: Payer: Self-pay | Admitting: Internal Medicine

## 2014-07-13 NOTE — Telephone Encounter (Signed)
emmi emailed °

## 2014-07-28 ENCOUNTER — Ambulatory Visit (INDEPENDENT_AMBULATORY_CARE_PROVIDER_SITE_OTHER): Payer: Medicare Other | Admitting: Internal Medicine

## 2014-07-28 ENCOUNTER — Encounter: Payer: Self-pay | Admitting: Internal Medicine

## 2014-07-28 VITALS — BP 140/70 | HR 65 | Temp 98.0°F | Resp 18 | Ht 63.0 in | Wt 156.0 lb

## 2014-07-28 DIAGNOSIS — M159 Polyosteoarthritis, unspecified: Secondary | ICD-10-CM

## 2014-07-28 DIAGNOSIS — E785 Hyperlipidemia, unspecified: Secondary | ICD-10-CM

## 2014-07-28 DIAGNOSIS — M8949 Other hypertrophic osteoarthropathy, multiple sites: Secondary | ICD-10-CM

## 2014-07-28 DIAGNOSIS — M150 Primary generalized (osteo)arthritis: Secondary | ICD-10-CM

## 2014-07-28 DIAGNOSIS — Z0000 Encounter for general adult medical examination without abnormal findings: Secondary | ICD-10-CM

## 2014-07-28 DIAGNOSIS — R7302 Impaired glucose tolerance (oral): Secondary | ICD-10-CM | POA: Diagnosis not present

## 2014-07-28 DIAGNOSIS — I10 Essential (primary) hypertension: Secondary | ICD-10-CM

## 2014-07-28 DIAGNOSIS — Z86010 Personal history of colonic polyps: Secondary | ICD-10-CM | POA: Diagnosis not present

## 2014-07-28 LAB — COMPREHENSIVE METABOLIC PANEL
ALBUMIN: 4.1 g/dL (ref 3.5–5.2)
ALT: 14 U/L (ref 0–35)
AST: 17 U/L (ref 0–37)
Alkaline Phosphatase: 164 U/L — ABNORMAL HIGH (ref 39–117)
BUN: 17 mg/dL (ref 6–23)
CO2: 32 meq/L (ref 19–32)
Calcium: 9.7 mg/dL (ref 8.4–10.5)
Chloride: 106 mEq/L (ref 96–112)
Creatinine, Ser: 0.65 mg/dL (ref 0.40–1.20)
GFR: 92.54 mL/min (ref >60.00)
GLUCOSE: 101 mg/dL — AB (ref 70–99)
POTASSIUM: 4.2 meq/L (ref 3.5–5.1)
Sodium: 143 mEq/L (ref 135–145)
TOTAL PROTEIN: 7 g/dL (ref 6.0–8.3)
Total Bilirubin: 0.5 mg/dL (ref 0.2–1.2)

## 2014-07-28 LAB — LIPID PANEL
CHOLESTEROL: 194 mg/dL (ref 0–200)
HDL: 54 mg/dL (ref >39.00)
LDL Cholesterol: 112 mg/dL — ABNORMAL HIGH (ref 0–99)
NONHDL: 140
Total CHOL/HDL Ratio: 4
Triglycerides: 142 mg/dL (ref 0.0–149.0)
VLDL: 28.4 mg/dL (ref 0.0–40.0)

## 2014-07-28 LAB — CBC WITH DIFFERENTIAL/PLATELET
Basophils Absolute: 0 10*3/uL (ref 0.0–0.1)
Basophils Relative: 0.2 % (ref 0.0–3.0)
EOS PCT: 1.6 % (ref 0.0–5.0)
Eosinophils Absolute: 0.2 10*3/uL (ref 0.0–0.7)
HCT: 45.3 % (ref 36.0–46.0)
HEMOGLOBIN: 15.2 g/dL — AB (ref 12.0–15.0)
Lymphocytes Relative: 30.3 % (ref 12.0–46.0)
Lymphs Abs: 3.3 10*3/uL (ref 0.7–4.0)
MCHC: 33.6 g/dL (ref 30.0–36.0)
MCV: 89.3 fl (ref 78.0–100.0)
Monocytes Absolute: 0.8 10*3/uL (ref 0.1–1.0)
Monocytes Relative: 7.9 % (ref 3.0–12.0)
NEUTROS PCT: 60 % (ref 43.0–77.0)
Neutro Abs: 6.4 10*3/uL (ref 1.4–7.7)
Platelets: 240 10*3/uL (ref 150.0–400.0)
RBC: 5.08 Mil/uL (ref 3.87–5.11)
RDW: 13.2 % (ref 11.5–15.5)
WBC: 10.7 10*3/uL — ABNORMAL HIGH (ref 4.0–10.5)

## 2014-07-28 LAB — TSH: TSH: 1.53 u[IU]/mL (ref 0.35–4.50)

## 2014-07-28 MED ORDER — SERTRALINE HCL 50 MG PO TABS: 50.0000 mg | ORAL_TABLET | Freq: Every day | ORAL | Status: DC

## 2014-07-28 MED ORDER — HYDROCHLOROTHIAZIDE 25 MG PO TABS: 25.0000 mg | ORAL_TABLET | Freq: Every day | ORAL | Status: DC

## 2014-07-28 MED ORDER — PHENTERMINE HCL 30 MG PO CAPS: 30.0000 mg | ORAL_CAPSULE | ORAL | Status: DC

## 2014-07-28 NOTE — Progress Notes (Signed)
Patient ID: Teresa Frey, female   DOB: [birth year], 81 y.o.   MRN: [id number]  Subjective:    Patient ID: Teresa Frey, female    DOB: December 03, 1929, 81 y.o.   MRN: [id number]  HPI  CC: cpx - doing well.  History of Present Illness:   81-year-old patient who is seen today for a preventive health examination.  Medical problems include treated hypertension, as well as osteoarthritis. She is followed closely by GI to do a personal history of colonic polyps and a family history of colon cancer. She is doing well today.   Here for Medicare AWV:   1. Risk factors based on Past M, S, F history: cardiac risk factors include hypertension only  2. Physical Activities: remains fairly active, although limited somewhat by osteoarthritis  3. Depression/mood- history of mild depression, which has been quite stable on Zoloft  4. Hearing: no hearing deficits  5. ADL's: independent in all aspects of daily living  6. Fall Risk: low  7. Home Safety: no problems identified  8. Height, weight, &visual acuity:height and weight stable. No change in visual acuity  9. Counseling: heart healthy diet regular. Exercise encouraged  10. Labs ordered based on risk factors: laboratory profile, including lipid panel will be reviewed  11. Referral Coordination- none appropriate at this time; will be followed colonoscopy Spring 2013  12. Care Plan- heart healthy diet modest weight loss encouraged; calcium supplementation encouraged  13. Cognitive Assessment- alert and oriented, with normal affect. No cognitive dysfunction, handles all executive functions without difficulty  14.  Preventive services will include an annual health examination was screening lab.  Annual eye examinations recommended..  Patient was provided with a written and personalized care plan 15.  Provider list updated.  Includes primary care gastroenterology, ophthalmology radiology  Allergies (verified):  No Known Drug Allergies   Past History:  Past  Medical History:   DJD  Colonic polyps, hx of  Diverticulitis, hx of  Hypertension  Anxiety  Paget's disease, lumbar spine and sacrum   Past Surgical History:   Appendectomy  Total hip replacement left 1989, with revision in 1999; status post right total hip replacement surgery in March 2006  Tubal ligation  Tonsillectomy  colonoscopy May 2007, May 2010  June 2013  Family History:   Family History of Colon CA 1st degree relative <60  Family History Lung cancer  Family History of Cardiovascular disorder  Family History of Leukemia  Family History of Hodgkins  father died of Hodgkin's diseasedisease and leukemia at age 71  mother died age 48, pancreatic cancer  Three brothers, one deceased of colon cancer. Another from lung cancer  third brother died with pancreatic cancer   Social History:   Occupation:  Married     Review of Systems  Constitutional: Negative.   HENT: Negative for congestion, dental problem, hearing loss, rhinorrhea, sinus pressure, sore throat and tinnitus.   Eyes: Negative for pain, discharge and visual disturbance.  Respiratory: Negative for cough and shortness of breath.   Cardiovascular: Negative for chest pain, palpitations and leg swelling.  Gastrointestinal: Negative for nausea, vomiting, abdominal pain, diarrhea, constipation, blood in stool and abdominal distention.  Genitourinary: Negative for dysuria, urgency, frequency, hematuria, flank pain, vaginal bleeding, vaginal discharge, difficulty urinating, vaginal pain and pelvic pain.  Musculoskeletal: Negative for joint swelling, arthralgias and gait problem.  Skin: Negative for rash.  Neurological: Negative for dizziness, syncope, speech difficulty, weakness, numbness and headaches.  Hematological: Negative for adenopathy.  Psychiatric/Behavioral:  Negative for behavioral problems, dysphoric mood and agitation. The patient is not nervous/anxious.        Objective:   Physical Exam   Constitutional: She is oriented to person, place, and time. She appears well-developed and well-nourished.  HENT:  Head: Normocephalic and atraumatic.  Right Ear: External ear normal.  Left Ear: External ear normal.  Mouth/Throat: Oropharynx is clear and moist.  Eyes: Conjunctivae and EOM are normal.  Neck: Normal range of motion. Neck supple. No JVD present. No thyromegaly present.  Cardiovascular: Normal rate, regular rhythm and normal heart sounds.   No murmur heard. The left posterior tibial pulse was full.  The right posterior tibial pulses faint.  Dorsalis pedis pulses not easily palpable  Pulmonary/Chest: Effort normal and breath sounds normal. She has no wheezes. She has no rales.  Few rales right base  Abdominal: Soft. Bowel sounds are normal. She exhibits no distension and no mass. There is no tenderness. There is no rebound and no guarding.  Musculoskeletal: Normal range of motion. She exhibits no edema or tenderness.  Neurological: She is alert and oriented to person, place, and time. She has normal reflexes. No cranial nerve deficit. She exhibits normal muscle tone. Coordination normal.  Skin: Skin is warm and dry. No rash noted.  Psychiatric: She has a normal mood and affect. Her behavior is normal.          Assessment & Plan:    Preventive health examination Osteoarthritis Hypertension well controlled History of colonic polyps and family history of colon cancer  We'll check updated labs. Medications were refilled low-salt diet more exercise encouraged we'll recheck one year

## 2014-07-28 NOTE — Patient Instructions (Signed)
Limit your sodium (Salt) intake    It is important that you exercise regularly, at least 20 minutes 3 to 4 times per week.  If you develop chest pain or shortness of breath seek  medical attention.  Return in one year for follow-up  Health Maintenance Adopting a healthy lifestyle and getting preventive care can go a long way to promote health and wellness. Talk with your health care provider about what schedule of regular examinations is right for you. This is a good chance for you to check in with your provider about disease prevention and staying healthy. In between checkups, there are plenty of things you can do on your own. Experts have done a lot of research about which lifestyle changes and preventive measures are most likely to keep you healthy. Ask your health care provider for more information. WEIGHT AND DIET  Eat a healthy diet  Be sure to include plenty of vegetables, fruits, low-fat dairy products, and lean protein.  Do not eat a lot of foods high in solid fats, added sugars, or salt.  Get regular exercise. This is one of the most important things you can do for your health.  Most adults should exercise for at least 150 minutes each week. The exercise should increase your heart rate and make you sweat (moderate-intensity exercise).  Most adults should also do strengthening exercises at least twice a week. This is in addition to the moderate-intensity exercise.  Maintain a healthy weight  Body mass index (BMI) is a measurement that can be used to identify possible weight problems. It estimates body fat based on height and weight. Your health care provider can help determine your BMI and help you achieve or maintain a healthy weight.  For females 8 years of age and older:   A BMI below 18.5 is considered underweight.  A BMI of 18.5 to 24.9 is normal.  A BMI of 25 to 29.9 is considered overweight.  A BMI of 30 and above is considered obese.  Watch levels of cholesterol  and blood lipids  You should start having your blood tested for lipids and cholesterol at 79 years of age, then have this test every 5 years.  You may need to have your cholesterol levels checked more often if:  Your lipid or cholesterol levels are high.  You are older than 80 years of age.  You are at high risk for heart disease.  CANCER SCREENING   Lung Cancer  Lung cancer screening is recommended for adults 58-70 years old who are at high risk for lung cancer because of a history of smoking.  A yearly low-dose CT scan of the lungs is recommended for people who:  Currently smoke.  Have quit within the past 15 years.  Have at least a 30-pack-year history of smoking. A pack year is smoking an average of one pack of cigarettes a day for 1 year.  Yearly screening should continue until it has been 15 years since you quit.  Yearly screening should stop if you develop a health problem that would prevent you from having lung cancer treatment.  Breast Cancer  Practice breast self-awareness. This means understanding how your breasts normally appear and feel.  It also means doing regular breast self-exams. Let your health care provider know about any changes, no matter how small.  If you are in your 20s or 30s, you should have a clinical breast exam (CBE) by a health care provider every 1-3 years as part of a  regular health exam.  If you are 40 or older, have a CBE every year. Also consider having a breast X-ray (mammogram) every year.  If you have a family history of breast cancer, talk to your health care provider about genetic screening.  If you are at high risk for breast cancer, talk to your health care provider about having an MRI and a mammogram every year.  Breast cancer gene (BRCA) assessment is recommended for women who have family members with BRCA-related cancers. BRCA-related cancers include:  Breast.  Ovarian.  Tubal.  Peritoneal cancers.  Results of the  assessment will determine the need for genetic counseling and BRCA1 and BRCA2 testing. Cervical Cancer Routine pelvic examinations to screen for cervical cancer are no longer recommended for nonpregnant women who are considered low risk for cancer of the pelvic organs (ovaries, uterus, and vagina) and who do not have symptoms. A pelvic examination may be necessary if you have symptoms including those associated with pelvic infections. Ask your health care provider if a screening pelvic exam is right for you.   The Pap test is the screening test for cervical cancer for women who are considered at risk.  If you had a hysterectomy for a problem that was not cancer or a condition that could lead to cancer, then you no longer need Pap tests.  If you are older than 65 years, and you have had normal Pap tests for the past 10 years, you no longer need to have Pap tests.  If you have had past treatment for cervical cancer or a condition that could lead to cancer, you need Pap tests and screening for cancer for at least 20 years after your treatment.  If you no longer get a Pap test, assess your risk factors if they change (such as having a new sexual partner). This can affect whether you should start being screened again.  Some women have medical problems that increase their chance of getting cervical cancer. If this is the case for you, your health care provider may recommend more frequent screening and Pap tests.  The human papillomavirus (HPV) test is another test that may be used for cervical cancer screening. The HPV test looks for the virus that can cause cell changes in the cervix. The cells collected during the Pap test can be tested for HPV.  The HPV test can be used to screen women 30 years of age and older. Getting tested for HPV can extend the interval between normal Pap tests from three to five years.  An HPV test also should be used to screen women of any age who have unclear Pap test  results.  After 79 years of age, women should have HPV testing as often as Pap tests.  Colorectal Cancer  This type of cancer can be detected and often prevented.  Routine colorectal cancer screening usually begins at 79 years of age and continues through 79 years of age.  Your health care provider may recommend screening at an earlier age if you have risk factors for colon cancer.  Your health care provider may also recommend using home test kits to check for hidden blood in the stool.  A small camera at the end of a tube can be used to examine your colon directly (sigmoidoscopy or colonoscopy). This is done to check for the earliest forms of colorectal cancer.  Routine screening usually begins at age 50.  Direct examination of the colon should be repeated every 5-10 years through 79   years of age. However, you may need to be screened more often if early forms of precancerous polyps or small growths are found. Skin Cancer  Check your skin from head to toe regularly.  Tell your health care provider about any new moles or changes in moles, especially if there is a change in a mole's shape or color.  Also tell your health care provider if you have a mole that is larger than the size of a pencil eraser.  Always use sunscreen. Apply sunscreen liberally and repeatedly throughout the day.  Protect yourself by wearing long sleeves, pants, a wide-brimmed hat, and sunglasses whenever you are outside. HEART DISEASE, DIABETES, AND HIGH BLOOD PRESSURE   Have your blood pressure checked at least every 1-2 years. High blood pressure causes heart disease and increases the risk of stroke.  If you are between 55 years and 79 years old, ask your health care provider if you should take aspirin to prevent strokes.  Have regular diabetes screenings. This involves taking a blood sample to check your fasting blood sugar level.  If you are at a normal weight and have a low risk for diabetes, have this  test once every three years after 79 years of age.  If you are overweight and have a high risk for diabetes, consider being tested at a younger age or more often. PREVENTING INFECTION  Hepatitis B  If you have a higher risk for hepatitis B, you should be screened for this virus. You are considered at high risk for hepatitis B if:  You were born in a country where hepatitis B is common. Ask your health care provider which countries are considered high risk.  Your parents were born in a high-risk country, and you have not been immunized against hepatitis B (hepatitis B vaccine).  You have HIV or AIDS.  You use needles to inject street drugs.  You live with someone who has hepatitis B.  You have had sex with someone who has hepatitis B.  You get hemodialysis treatment.  You take certain medicines for conditions, including cancer, organ transplantation, and autoimmune conditions. Hepatitis C  Blood testing is recommended for:  Everyone born from 1945 through 1965.  Anyone with known risk factors for hepatitis C. Sexually transmitted infections (STIs)  You should be screened for sexually transmitted infections (STIs) including gonorrhea and chlamydia if:  You are sexually active and are younger than 79 years of age.  You are older than 79 years of age and your health care provider tells you that you are at risk for this type of infection.  Your sexual activity has changed since you were last screened and you are at an increased risk for chlamydia or gonorrhea. Ask your health care provider if you are at risk.  If you do not have HIV, but are at risk, it may be recommended that you take a prescription medicine daily to prevent HIV infection. This is called pre-exposure prophylaxis (PrEP). You are considered at risk if:  You are sexually active and do not regularly use condoms or know the HIV status of your partner(s).  You take drugs by injection.  You are sexually active with  a partner who has HIV. Talk with your health care provider about whether you are at high risk of being infected with HIV. If you choose to begin PrEP, you should first be tested for HIV. You should then be tested every 3 months for as long as you are taking PrEP.    PREGNANCY   If you are premenopausal and you may become pregnant, ask your health care provider about preconception counseling.  If you may become pregnant, take 400 to 800 micrograms (mcg) of folic acid every day.  If you want to prevent pregnancy, talk to your health care provider about birth control (contraception). OSTEOPOROSIS AND MENOPAUSE   Osteoporosis is a disease in which the bones lose minerals and strength with aging. This can result in serious bone fractures. Your risk for osteoporosis can be identified using a bone density scan.  If you are 65 years of age or older, or if you are at risk for osteoporosis and fractures, ask your health care provider if you should be screened.  Ask your health care provider whether you should take a calcium or vitamin D supplement to lower your risk for osteoporosis.  Menopause may have certain physical symptoms and risks.  Hormone replacement therapy may reduce some of these symptoms and risks. Talk to your health care provider about whether hormone replacement therapy is right for you.  HOME CARE INSTRUCTIONS   Schedule regular health, dental, and eye exams.  Stay current with your immunizations.   Do not use any tobacco products including cigarettes, chewing tobacco, or electronic cigarettes.  If you are pregnant, do not drink alcohol.  If you are breastfeeding, limit how much and how often you drink alcohol.  Limit alcohol intake to no more than 1 drink per day for nonpregnant women. One drink equals 12 ounces of beer, 5 ounces of wine, or 1 ounces of hard liquor.  Do not use street drugs.  Do not share needles.  Ask your health care provider for help if you need  support or information about quitting drugs.  Tell your health care provider if you often feel depressed.  Tell your health care provider if you have ever been abused or do not feel safe at home. Document Released: 12/04/2010 Document Revised: 10/05/2013 Document Reviewed: 04/22/2013 [employer] Patient Information 2015 [employer]. This information is not intended to replace advice given to you by your health care provider. Make sure you discuss any questions you have with your health care provider.  

## 2014-07-28 NOTE — Progress Notes (Signed)
Pre visit review using our clinic review tool, if applicable. No additional management support is needed unless otherwise documented below in the visit note. 

## 2014-09-22 ENCOUNTER — Encounter: Payer: Self-pay | Admitting: Internal Medicine

## 2014-09-24 ENCOUNTER — Ambulatory Visit (INDEPENDENT_AMBULATORY_CARE_PROVIDER_SITE_OTHER): Payer: Medicare Other | Admitting: Family Medicine

## 2014-09-24 ENCOUNTER — Encounter: Payer: Self-pay | Admitting: Family Medicine

## 2014-09-24 VITALS — BP 136/67 | HR 75 | Temp 98.5°F | Ht 63.0 in | Wt 156.0 lb

## 2014-09-24 DIAGNOSIS — J209 Acute bronchitis, unspecified: Secondary | ICD-10-CM

## 2014-09-24 MED ORDER — HYDROCODONE-HOMATROPINE 5-1.5 MG/5ML PO SYRP: 5.0000 mL | ORAL_SOLUTION | ORAL | Status: DC | PRN

## 2014-09-24 MED ORDER — AZITHROMYCIN 250 MG PO TABS: ORAL_TABLET | ORAL | Status: DC

## 2014-09-24 NOTE — Progress Notes (Signed)
Pre visit review using our clinic review tool, if applicable. No additional management support is needed unless otherwise documented below in the visit note. 

## 2014-09-24 NOTE — Progress Notes (Signed)
   Subjective:    Patient ID: Teresa Frey, female    DOB: September 19, 1929, 81 y.o.   MRN: [id number]  HPI Here for 5 days of chest tightness and coughing up green sputum. No fever.    Review of Systems  Constitutional: Negative.   HENT: Positive for congestion. Negative for postnasal drip and sinus pressure.   Eyes: Negative.   Respiratory: Positive for cough and chest tightness. Negative for shortness of breath, wheezing and stridor.   Cardiovascular: Negative.        Objective:   Physical Exam  Constitutional: She appears well-developed and well-nourished.  HENT:  Right Ear: External ear normal.  Left Ear: External ear normal.  Nose: Nose normal.  Mouth/Throat: Oropharynx is clear and moist.  Eyes: Conjunctivae are normal.  Cardiovascular: Normal rate, regular rhythm, normal heart sounds and intact distal pulses.   Pulmonary/Chest: Effort normal. No respiratory distress. She has no wheezes. She has no rales.  Scattered rhonchi   Lymphadenopathy:    She has no cervical adenopathy.          Assessment & Plan:  Bronchitis. Treat with a Zpack

## 2014-11-08 DIAGNOSIS — H04213 Epiphora due to excess lacrimation, bilateral lacrimal glands: Secondary | ICD-10-CM | POA: Diagnosis not present

## 2014-11-08 DIAGNOSIS — H01001 Unspecified blepharitis right upper eyelid: Secondary | ICD-10-CM | POA: Diagnosis not present

## 2014-11-08 DIAGNOSIS — H43813 Vitreous degeneration, bilateral: Secondary | ICD-10-CM | POA: Diagnosis not present

## 2014-11-08 DIAGNOSIS — H26492 Other secondary cataract, left eye: Secondary | ICD-10-CM | POA: Diagnosis not present

## 2014-11-24 ENCOUNTER — Encounter: Payer: Self-pay | Admitting: Internal Medicine

## 2014-11-24 ENCOUNTER — Ambulatory Visit (INDEPENDENT_AMBULATORY_CARE_PROVIDER_SITE_OTHER): Payer: Medicare Other | Admitting: Internal Medicine

## 2014-11-24 VITALS — BP 128/60 | HR 72 | Ht 63.0 in | Wt 153.8 lb

## 2014-11-24 DIAGNOSIS — Z86010 Personal history of colonic polyps: Secondary | ICD-10-CM

## 2014-11-24 DIAGNOSIS — Z800 Family history of malignant neoplasm of digestive organs: Secondary | ICD-10-CM

## 2014-11-24 MED ORDER — NA SULFATE-K SULFATE-MG SULF 17.5-3.13-1.6 GM/177ML PO SOLN: 1.0000 | Freq: Once | ORAL | Status: DC

## 2014-11-24 NOTE — Patient Instructions (Signed)

## 2014-11-24 NOTE — Progress Notes (Signed)
HISTORY OF PRESENT ILLNESS:  Teresa Frey is a 81 y.o. female with a history of hypertension who presents today regarding surveillance colonoscopy. The patient has a history of multiple adenomatous colon polyps as well as a family history of colon cancer in her sibling. The patient has had prior examinations in 1999, 2001, 2004, 2007, 2010, and 2013 with MULTIPLE ADENOMAS. At her last examination she was found to have 5 polyps. Patient was sent to the office today because of her current age. Despite her age, she remains active and works full time. Review of outside blood work from February 2016 reveals unremarkable comprehensive metabolic panel and CBC. She denies any active GI symptoms. No interval change in her medical history. She is motivated to have surveillance colonoscopy.  REVIEW OF SYSTEMS:  All non-GI ROS negative except for anxiety  Past Medical History  Diagnosis Date  . ANXIETY 04/07/2007  . COLONIC POLYPS, HX OF 04/02/2007    adenomatous  . DIVERTICULITIS, HX OF 04/02/2007  . HYPERTENSION 04/02/2007  . OSTEOARTHRITIS, SEVERE 04/07/2007  . WEIGHT GAIN 11/13/2007  . Paget's disease of bone     lumbar and sacrum  . DJD (degenerative joint disease)   . Hemorrhoids   . Diverticulosis     Past Surgical History  Procedure Laterality Date  . Appendectomy    . Tubal ligation    . Tonsillectomy    . Total hip arthroplasty  1999, left hip     2006 right hip    Social History Teresa Frey  reports that she quit smoking about 36 years ago. She has never used smokeless tobacco. She reports that she drinks about 4.2 oz of alcohol per week. She reports that she does not use illicit drugs.  family history includes Colon cancer in her brother; Lung cancer in an other family member; Pancreatic cancer in an other family member. There is no history of Heart disease, Esophageal cancer, Rectal cancer, or Stomach cancer.  No Known Allergies     PHYSICAL EXAMINATION: Vital signs: BP  128/60 mmHg  Pulse 72  Ht 5\' 3"  (1.6 m)  Wt 153 lb 12.8 oz (69.763 kg)  BMI 27.25 kg/m2  Constitutional: generally well-appearing, no acute distress Psychiatric: alert and oriented x3, cooperative Eyes: extraocular movements intact, anicteric, conjunctiva pink Mouth: oral pharynx moist, no lesions Neck: supple no lymphadenopathy Cardiovascular: heart regular rate and rhythm, no murmur Lungs: clear to auscultation bilaterally Abdomen: soft, nontender, nondistended, no obvious ascites, no peritoneal signs, normal bowel sounds, no organomegaly Rectal: Deferred until colonoscopy Extremities: no lower extremity edema bilaterally Skin: no lesions on visible extremities Neuro: No focal deficits.   ASSESSMENT:  #1. Remarkably healthy 81 year old female with a history of MULTIPLE ADENOMATOUS colon polyps and family history of colon cancer as described. The patient is an appropriate candidate for follow-up surveillance without contraindication.The nature of the procedure, as well as the risks, benefits, and alternatives were carefully and thoroughly reviewed with the patient. Ample time for discussion and questions allowed. The patient understood, was satisfied, and agreed to proceed.

## 2014-12-03 ENCOUNTER — Ambulatory Visit (AMBULATORY_SURGERY_CENTER): Payer: Medicare Other | Admitting: Internal Medicine

## 2014-12-03 ENCOUNTER — Encounter: Payer: Self-pay | Admitting: Internal Medicine

## 2014-12-03 VITALS — BP 120/52 | HR 58 | Temp 98.7°F | Resp 17 | Ht 63.0 in | Wt 153.0 lb

## 2014-12-03 DIAGNOSIS — I10 Essential (primary) hypertension: Secondary | ICD-10-CM | POA: Diagnosis not present

## 2014-12-03 DIAGNOSIS — Z800 Family history of malignant neoplasm of digestive organs: Secondary | ICD-10-CM

## 2014-12-03 DIAGNOSIS — Z86010 Personal history of colonic polyps: Secondary | ICD-10-CM | POA: Diagnosis present

## 2014-12-03 MED ORDER — SODIUM CHLORIDE 0.9 % IV SOLN: 500.0000 mL | INTRAVENOUS | Status: DC

## 2014-12-03 NOTE — Progress Notes (Signed)
Transferred to recovery room. A/O x3, pleased with MAC.  VSS.  Report to Olivia Mackie, Therapist, sports.

## 2014-12-03 NOTE — Patient Instructions (Signed)
Impressions/recommendations:  Diverticulosis (handout given) High Fiber diet (handout given)  YOU HAD AN ENDOSCOPIC PROCEDURE TODAY AT THE [facility]:   Refer to the procedure report that was given to you for any specific questions about what was found during the examination.  If the procedure report does not answer your questions, please call your gastroenterologist to clarify.  If you requested that your care partner not be given the details of your procedure findings, then the procedure report has been included in a sealed envelope for you to review at your convenience later.  YOU SHOULD EXPECT: Some feelings of bloating in the abdomen. Passage of more gas than usual.  Walking can help get rid of the air that was put into your GI tract during the procedure and reduce the bloating. If you had a lower endoscopy (such as a colonoscopy or flexible sigmoidoscopy) you may notice spotting of blood in your stool or on the toilet paper. If you underwent a bowel prep for your procedure, you may not have a normal bowel movement for a few days.  Please Note:  You might notice some irritation and congestion in your nose or some drainage.  This is from the oxygen used during your procedure.  There is no need for concern and it should clear up in a day or so.  SYMPTOMS TO REPORT IMMEDIATELY:   Following lower endoscopy (colonoscopy or flexible sigmoidoscopy):  Excessive amounts of blood in the stool  Significant tenderness or worsening of abdominal pains  Swelling of the abdomen that is new, acute  Fever of 100F or higher  For urgent or emergent issues, a gastroenterologist can be reached at any hour by calling [phone].   DIET: Your first meal following the procedure should be a small meal and then it is ok to progress to your normal diet. Heavy or fried foods are harder to digest and may make you feel nauseous or bloated.  Likewise, meals heavy in dairy and vegetables can increase  bloating.  Drink plenty of fluids but you should avoid alcoholic beverages for 24 hours.  ACTIVITY:  You should plan to take it easy for the rest of today and you should NOT DRIVE or use heavy machinery until tomorrow (because of the sedation medicines used during the test).    FOLLOW UP: Our staff will call the number listed on your records the next business day following your procedure to check on you and address any questions or concerns that you may have regarding the information given to you following your procedure. If we do not reach you, we will leave a message.  However, if you are feeling well and you are not experiencing any problems, there is no need to return our call.  We will assume that you have returned to your regular daily activities without incident.  If any biopsies were taken you will be contacted by phone or by letter within the next 1-3 weeks.  Please call us at [phone] if you have not heard about the biopsies in 3 weeks.    SIGNATURES/CONFIDENTIALITY: You and/or your care partner have signed paperwork which will be entered into your electronic medical record.  These signatures attest to the fact that that the information above on your After Visit Summary has been reviewed and is understood.  Full responsibility of the confidentiality of this discharge information lies with you and/or your care-partner. 

## 2014-12-03 NOTE — Op Note (Signed)
[place]  [address] [place], [id number]   COLONOSCOPY PROCEDURE REPORT  PATIENT: Teresa Frey, Teresa Frey  MR#: [id number] BIRTHDATE: [birth year] , 85  yrs. old GENDER: female ENDOSCOPIST: Eustace Quail, MD REFERRED CY:[id number] Program Recall PROCEDURE DATE:  12/03/2014 PROCEDURE:   Colonoscopy, surveillance First Screening Colonoscopy - Avg.  risk and is 50 yrs.  old or older - No.  Prior Negative Screening - Now for repeat screening. N/A  History of Adenoma - Now for follow-up colonoscopy & has been > or = to 3 yrs.  Yes hx of adenoma.  Has been 3 or more years since last colonoscopy.  Polyps removed today? No Recommend repeat exam, <10 yrs? No ASA CLASS:   Class I INDICATIONS:Surveillance due to prior colonic neoplasia, FH Colon or Rectal Adenocarcinoma, and PH & FH Colon Adenoma. Multiple previous colonoscopies 1999, 2001, 2004, 2007, 2010, 2013. MULTIPLE ADENOMA. Sibling with colon cancer.Marland Kitchen MEDICATIONS: Monitored anesthesia care and Propofol 240 mg IV  DESCRIPTION OF PROCEDURE:   After the risks benefits and alternatives of the procedure were thoroughly explained, informed consent was obtained.  The digital rectal exam revealed no abnormalities of the rectum.   The LB [id number]  endoscope was introduced through the anus and advanced to the cecum, which was identified by both the appendix and ileocecal valve. No adverse events experienced.   The quality of the prep was excellent. (Suprep was used)  The instrument was then slowly withdrawn as the colon was fully examined. Estimated blood loss is zero unless otherwise noted in this procedure report.      COLON FINDINGS: There was moderate diverticulosis noted in the sigmoid colon.   The examination was otherwise normal.  Retroflexed views revealed internal hemorrhoids. The time to cecum = 4.2 Withdrawal time = 10.5   The scope was withdrawn and the procedure completed.  COMPLICATIONS: There  were no immediate complications.  ENDOSCOPIC IMPRESSION: 1.   Moderate diverticulosis was noted in the sigmoid colon 2.   The examination was otherwise normal  RECOMMENDATIONS: 1. Return to the care of your primary provider.  GI follow up as needed  eSigned:  Eustace Quail, MD 12/03/2014 3:30 PM   cc: The Patient and Marletta Lor, MD

## 2014-12-07 ENCOUNTER — Encounter: Payer: Self-pay | Admitting: Internal Medicine

## 2014-12-07 ENCOUNTER — Telehealth: Payer: Self-pay | Admitting: *Deleted

## 2014-12-07 NOTE — Telephone Encounter (Signed)
Number left was a work number therefore did not leave a message. Person who answered phone said pt would not be in until later on today because she is taking her husband to the doctor this morning.

## 2014-12-28 DIAGNOSIS — Z1231 Encounter for screening mammogram for malignant neoplasm of breast: Secondary | ICD-10-CM | POA: Diagnosis not present

## 2014-12-28 LAB — HM MAMMOGRAPHY: HM Mammogram: NEGATIVE

## 2014-12-30 ENCOUNTER — Encounter: Payer: Self-pay | Admitting: Internal Medicine

## 2015-01-03 DIAGNOSIS — L820 Inflamed seborrheic keratosis: Secondary | ICD-10-CM | POA: Diagnosis not present

## 2015-01-03 DIAGNOSIS — D485 Neoplasm of uncertain behavior of skin: Secondary | ICD-10-CM | POA: Diagnosis not present

## 2015-01-03 DIAGNOSIS — L821 Other seborrheic keratosis: Secondary | ICD-10-CM | POA: Diagnosis not present

## 2015-01-03 DIAGNOSIS — D239 Other benign neoplasm of skin, unspecified: Secondary | ICD-10-CM | POA: Diagnosis not present

## 2015-01-24 ENCOUNTER — Emergency Department (HOSPITAL_COMMUNITY)
Admission: EM | Admit: 2015-01-24 | Discharge: 2015-01-24 | Disposition: A | Discharge status: Home / Self Care | Payer: Medicare Other | Attending: Emergency Medicine | Admitting: Emergency Medicine

## 2015-01-24 ENCOUNTER — Encounter (HOSPITAL_COMMUNITY): Payer: Self-pay | Admitting: *Deleted

## 2015-01-24 ENCOUNTER — Emergency Department (HOSPITAL_COMMUNITY): Payer: Medicare Other

## 2015-01-24 DIAGNOSIS — Y9389 Activity, other specified: Secondary | ICD-10-CM | POA: Diagnosis not present

## 2015-01-24 DIAGNOSIS — Y9289 Other specified places as the place of occurrence of the external cause: Secondary | ICD-10-CM | POA: Insufficient documentation

## 2015-01-24 DIAGNOSIS — Z23 Encounter for immunization: Secondary | ICD-10-CM | POA: Insufficient documentation

## 2015-01-24 DIAGNOSIS — F419 Anxiety disorder, unspecified: Secondary | ICD-10-CM | POA: Diagnosis not present

## 2015-01-24 DIAGNOSIS — M19041 Primary osteoarthritis, right hand: Secondary | ICD-10-CM | POA: Diagnosis not present

## 2015-01-24 DIAGNOSIS — Y998 Other external cause status: Secondary | ICD-10-CM | POA: Insufficient documentation

## 2015-01-24 DIAGNOSIS — S60221A Contusion of right hand, initial encounter: Secondary | ICD-10-CM | POA: Insufficient documentation

## 2015-01-24 DIAGNOSIS — Z87891 Personal history of nicotine dependence: Secondary | ICD-10-CM | POA: Insufficient documentation

## 2015-01-24 DIAGNOSIS — Z86010 Personal history of colonic polyps: Secondary | ICD-10-CM | POA: Insufficient documentation

## 2015-01-24 DIAGNOSIS — S92301A Fracture of unspecified metatarsal bone(s), right foot, initial encounter for closed fracture: Secondary | ICD-10-CM

## 2015-01-24 DIAGNOSIS — W010XXA Fall on same level from slipping, tripping and stumbling without subsequent striking against object, initial encounter: Secondary | ICD-10-CM | POA: Diagnosis not present

## 2015-01-24 DIAGNOSIS — Z79899 Other long term (current) drug therapy: Secondary | ICD-10-CM | POA: Diagnosis not present

## 2015-01-24 DIAGNOSIS — I10 Essential (primary) hypertension: Secondary | ICD-10-CM | POA: Insufficient documentation

## 2015-01-24 DIAGNOSIS — Z8719 Personal history of other diseases of the digestive system: Secondary | ICD-10-CM | POA: Diagnosis not present

## 2015-01-24 DIAGNOSIS — Z8739 Personal history of other diseases of the musculoskeletal system and connective tissue: Secondary | ICD-10-CM | POA: Diagnosis not present

## 2015-01-24 DIAGNOSIS — S6991XA Unspecified injury of right wrist, hand and finger(s), initial encounter: Secondary | ICD-10-CM | POA: Diagnosis not present

## 2015-01-24 DIAGNOSIS — S92351A Displaced fracture of fifth metatarsal bone, right foot, initial encounter for closed fracture: Secondary | ICD-10-CM | POA: Diagnosis not present

## 2015-01-24 DIAGNOSIS — M79644 Pain in right finger(s): Secondary | ICD-10-CM | POA: Diagnosis not present

## 2015-01-24 DIAGNOSIS — S99921A Unspecified injury of right foot, initial encounter: Secondary | ICD-10-CM | POA: Diagnosis present

## 2015-01-24 MED ORDER — TETANUS-DIPHTH-ACELL PERTUSSIS 5-2.5-18.5 LF-MCG/0.5 IM SUSP
0.5000 mL | Freq: Once | INTRAMUSCULAR | Status: AC
  Administered 2015-01-24: 0.5 mL via INTRAMUSCULAR
  Filled 2015-01-24: qty 0.5

## 2015-01-24 NOTE — ED Notes (Signed)
Pt reports she tripped and fell yesterday, pt now having pain to right hand and right foot. Some bruising noted. Pt hit head on corner of table, one cut noted to forehead, denies LOC.

## 2015-01-24 NOTE — ED Notes (Signed)
Awake. Verbally responsive. A/O x4. Resp even and unlabored. No audible adventitious breath sounds noted. ABC's intact.  

## 2015-01-24 NOTE — Discharge Instructions (Signed)
Take 4 over the counter ibuprofen tablets 3 times a day or 2 over-the-counter naproxen tablets twice a day for pain.  Cast Shoe Cast shoes are rigid shoes that help you walk with a cast. Cast shoes help treat minor foot sprains and fractures. These shoes reduce the amount of movement in the joints of the foot. This makes walking less painful. As long as your injury is painful, you should use crutches to get around. Avoid all weight bearing until your caregiver approves. When your caregiver says it is safe to put weight on your injured leg or foot, you can begin to use your cast shoe. Make sure it is adjusted right. Ask your caregiver to show you how to adjust it if you are not sure. Most of these shoes have velcro straps which make this easy. You should avoid using any other shoes until your caregiver says it is safe. Document Released: 06/28/2004 Document Revised: 08/13/2011 Document Reviewed: 06/17/2008 [facility] Patient Information 2015 [place], [place]. This information is not intended to replace advice given to you by your health care provider. Make sure you discuss any questions you have with your health care provider.

## 2015-01-24 NOTE — ED Notes (Signed)
Pt reported falling yesterday after stepping on dog's pain without dizziness/lightheadedness, LOC, headache, and blurred vision. Pt reported hitting head on kitchen counter causing small lac to lt fons with edges approximated, bruising to lt eye, redness to rt elbow, redness and swelling to rt hand with (+)PMS, LROM to rt 2nd finger, CRT brisk, and no deformity noted and rt foot with swelling/bruising, (+)PMS, CRT brisk, PWB, LROM but no deformity noted.

## 2015-01-24 NOTE — ED Notes (Signed)
Awake. Verbally responsive. A/O x4. Resp even and unlabored. No audible adventitious breath sounds noted. ABC's intact. Family at bedside. 

## 2015-01-24 NOTE — ED Provider Notes (Signed)
CSN: [id number]     Arrival date & time 01/24/15  1112 History   First MD Initiated Contact with Patient 01/24/15 1155     Chief Complaint  Patient presents with  . Fall  . Hand Pain  . Foot Pain     (Consider location/radiation/quality/duration/timing/severity/associated sxs/prior Treatment) Patient is a 81 y.o. female presenting with fall. The history is provided by the patient.  Fall This is a new problem. The current episode started yesterday. The problem occurs constantly. The problem has been gradually worsening. Pertinent negatives include no chest pain, no headaches and no shortness of breath. Nothing aggravates the symptoms. Nothing relieves the symptoms. She has tried nothing for the symptoms. The treatment provided no relief.   81 yo F with a chief complaint of fall. Patient stepped on her dog's foot and then fell to the side. Patient mildly bumped her head on a coffee table denies LOC. Patient with pain to her right foot and right hand. Patient was able to walk without difficulty yesterday having worsening pain today. Here to see if she is fractured anything or not.  Past Medical History  Diagnosis Date  . ANXIETY 04/07/2007  . COLONIC POLYPS, HX OF 04/02/2007    adenomatous  . DIVERTICULITIS, HX OF 04/02/2007  . HYPERTENSION 04/02/2007  . OSTEOARTHRITIS, SEVERE 04/07/2007  . WEIGHT GAIN 11/13/2007  . Paget's disease of bone     lumbar and sacrum  . DJD (degenerative joint disease)   . Hemorrhoids   . Diverticulosis    Past Surgical History  Procedure Laterality Date  . Appendectomy    . Tubal ligation    . Tonsillectomy    . Total hip arthroplasty  1999, left hip     2006 right hip   Family History  Problem Relation Age of Onset  . Lung cancer    . Heart disease Neg Hx     family  . Esophageal cancer Neg Hx   . Rectal cancer Neg Hx   . Stomach cancer Neg Hx   . Colon cancer Brother   . Pancreatic cancer     Social History  Substance Use Topics  .  Smoking status: Former Smoker    Quit date: 06/04/1978  . Smokeless tobacco: Never Used  . Alcohol Use: 4.2 oz/week    7 Glasses of wine per week   OB History    No data available     Review of Systems  Constitutional: Negative for fever and chills.  HENT: Negative for congestion and rhinorrhea.   Eyes: Negative for redness and visual disturbance.  Respiratory: Negative for shortness of breath and wheezing.   Cardiovascular: Negative for chest pain and palpitations.  Gastrointestinal: Negative for nausea and vomiting.  Genitourinary: Negative for dysuria and urgency.  Musculoskeletal: Positive for myalgias and arthralgias.  Skin: Negative for pallor and wound.  Neurological: Negative for dizziness and headaches.      Allergies  Review of patient's allergies indicates no known allergies.  Home Medications   Prior to Admission medications   Medication Sig Start Date End Date Taking? Authorizing Provider  BIOTIN 5000 PO Take 1 tablet by mouth daily.   Yes Historical Provider, MD  hydrochlorothiazide (HYDRODIURIL) 25 MG tablet Take 1 tablet (25 mg total) by mouth daily. 07/28/14  Yes Marletta Lor, MD  Multiple Vitamins-Minerals (CENTRUM SILVER ADULT 50+) TABS Take 1 tablet by mouth daily.   Yes Historical Provider, MD  phentermine 30 MG capsule Take 1 capsule (30 mg total)  by mouth every morning. 07/28/14  Yes Marletta Lor, MD  sertraline (ZOLOFT) 50 MG tablet Take 1 tablet (50 mg total) by mouth daily. 07/28/14  Yes Marletta Lor, MD  vitamin C (ASCORBIC ACID) 500 MG tablet Take 1,500 mg by mouth daily.   Yes Historical Provider, MD   BP 150/64 mmHg  Pulse 65  Temp(Src) 98.7 F (37.1 C) (Oral)  Resp 18  SpO2 97% Physical Exam  Constitutional: She is oriented to person, place, and time. She appears well-developed and well-nourished. No distress.  HENT:  Head: Normocephalic and atraumatic.  Eyes: EOM are normal. Pupils are equal, round, and reactive to  light.  Neck: Normal range of motion. Neck supple.  Cardiovascular: Normal rate and regular rhythm.  Exam reveals no gallop and no friction rub.   No murmur heard. Pulmonary/Chest: Effort normal. She has no wheezes. She has no rales.  Abdominal: Soft. She exhibits no distension. There is no tenderness. There is no rebound.  Musculoskeletal: She exhibits tenderness. She exhibits no edema.       Hands:      Feet:  Neurological: She is alert and oriented to person, place, and time.  Skin: Skin is warm and dry. She is not diaphoretic.  Psychiatric: She has a normal mood and affect. Her behavior is normal.    ED Course  Procedures (including critical care time) Labs Review Labs Reviewed - No data to display  Imaging Review Dg Hand Complete Right  01/24/2015   CLINICAL DATA:  Fall with right hand pain. Pain at the second and third MCP joints.  EXAM: RIGHT HAND - COMPLETE 3+ VIEW  COMPARISON:  None.  FINDINGS: No evidence for an acute fracture or dislocation. Mild degenerative changes in the interphalangeal joints. Osteoarthritic changes at the first carpometacarpal joint. The wrist is located. Soft tissues are unremarkable.  IMPRESSION: No acute bone abnormality.  Osteoarthritic changes.   Electronically Signed   By: Markus Daft M.D.   On: 01/24/2015 13:27   Dg Foot Complete Right  01/24/2015   CLINICAL DATA:  Right foot pain status post fall. Difficulty flexing first MTP joint. Second through fourth metatarsal pain.  EXAM: RIGHT FOOT COMPLETE - 3+ VIEW  COMPARISON:  None.  FINDINGS: There is an oblique fracture through the right fifth metatarsal, minimally displaced. No additional acute bony abnormality. No intra-articular extension. Overlying soft tissues are intact.  IMPRESSION: Oblique fracture through the right fifth metatarsal.   Electronically Signed   By: Rolm Baptise M.D.   On: 01/24/2015 13:25   I have personally reviewed and evaluated these images and lab results as part of my medical  decision-making.   EKG Interpretation None      MDM   Final diagnoses:  Fractured metatarsal bone, right, closed, initial encounter    81 yo F with a chief complaint of a fall. Patient with pain about her right hand worst in the first MCP. Patient also with pain in the right foot. Able to walk without difficulty.  X-ray of the hand negative for acute fracture. Patient found to have a right fifth metacarpal fracture. Place in a postop shoe.  Feel the need for CT of the head without any current neuro or complaint of headache.  Have her follow-up with orthopedics.   I have discussed the diagnosis/risks/treatment options with the patient and family and believe the pt to be eligible for discharge home to follow-up with Ortho. We also discussed returning to the ED immediately if new  or worsening sx occur. We discussed the sx which are most concerning (e.g., sudden worsening pain, new nerve issues) that necessitate immediate return. Medications administered to the patient during their visit and any new prescriptions provided to the patient are listed below.  Medications given during this visit Medications  Tdap (BOOSTRIX) injection 0.5 mL (0.5 mLs Intramuscular Given 01/24/15 1247)    Discharge Medication List as of 01/24/2015  1:36 PM       The patient appears reasonably screen and/or stabilized for discharge and I doubt any other medical condition or other [facility] requiring further screening, evaluation, or treatment in the ED at this time prior to discharge.    Deno Etienne, DO 01/24/15 1512

## 2015-01-31 DIAGNOSIS — S92354A Nondisplaced fracture of fifth metatarsal bone, right foot, initial encounter for closed fracture: Secondary | ICD-10-CM | POA: Diagnosis not present

## 2015-02-28 DIAGNOSIS — S92354D Nondisplaced fracture of fifth metatarsal bone, right foot, subsequent encounter for fracture with routine healing: Secondary | ICD-10-CM | POA: Diagnosis not present

## 2015-04-05 ENCOUNTER — Ambulatory Visit (INDEPENDENT_AMBULATORY_CARE_PROVIDER_SITE_OTHER): Payer: Medicare Other | Admitting: *Deleted

## 2015-04-05 DIAGNOSIS — Z23 Encounter for immunization: Secondary | ICD-10-CM | POA: Diagnosis not present

## 2015-08-22 ENCOUNTER — Other Ambulatory Visit: Payer: Self-pay | Admitting: Internal Medicine

## 2015-08-25 ENCOUNTER — Other Ambulatory Visit: Payer: Self-pay | Admitting: Internal Medicine

## 2015-10-06 ENCOUNTER — Other Ambulatory Visit (INDEPENDENT_AMBULATORY_CARE_PROVIDER_SITE_OTHER): Payer: Medicare Other

## 2015-10-06 DIAGNOSIS — Z0000 Encounter for general adult medical examination without abnormal findings: Secondary | ICD-10-CM | POA: Diagnosis not present

## 2015-10-06 LAB — LIPID PANEL
Cholesterol: 200 mg/dL (ref 0–200)
HDL: 53.2 mg/dL (ref >39.00)
LDL Cholesterol: 121 mg/dL — ABNORMAL HIGH (ref 0–99)
NonHDL: 147.18
TRIGLYCERIDES: 133 mg/dL (ref 0.0–149.0)
Total CHOL/HDL Ratio: 4
VLDL: 26.6 mg/dL (ref 0.0–40.0)

## 2015-10-06 LAB — POC URINALSYSI DIPSTICK (AUTOMATED)
Bilirubin, UA: NEGATIVE
Blood, UA: NEGATIVE
Glucose, UA: NEGATIVE
KETONES UA: NEGATIVE
Leukocytes, UA: NEGATIVE
NITRITE UA: NEGATIVE
PH UA: 6
Spec Grav, UA: 1.025
Urobilinogen, UA: 1

## 2015-10-06 LAB — BASIC METABOLIC PANEL
BUN: 14 mg/dL (ref 6–23)
CO2: 31 meq/L (ref 19–32)
Calcium: 9.7 mg/dL (ref 8.4–10.5)
Chloride: 105 mEq/L (ref 96–112)
Creatinine, Ser: 0.74 mg/dL (ref 0.40–1.20)
GFR: 79.45 mL/min (ref >60.00)
GLUCOSE: 109 mg/dL — AB (ref 70–99)
POTASSIUM: 3.7 meq/L (ref 3.5–5.1)
SODIUM: 143 meq/L (ref 135–145)

## 2015-10-06 LAB — CBC WITH DIFFERENTIAL/PLATELET
Basophils Absolute: 0 10*3/uL (ref 0.0–0.1)
Basophils Relative: 0.3 % (ref 0.0–3.0)
EOS PCT: 1 % (ref 0.0–5.0)
Eosinophils Absolute: 0.1 10*3/uL (ref 0.0–0.7)
HCT: 44 % (ref 36.0–46.0)
Hemoglobin: 14.8 g/dL (ref 12.0–15.0)
LYMPHS ABS: 2.5 10*3/uL (ref 0.7–4.0)
Lymphocytes Relative: 26.3 % (ref 12.0–46.0)
MCHC: 33.7 g/dL (ref 30.0–36.0)
MCV: 88.3 fl (ref 78.0–100.0)
MONOS PCT: 7.2 % (ref 3.0–12.0)
Monocytes Absolute: 0.7 10*3/uL (ref 0.1–1.0)
NEUTROS ABS: 6.1 10*3/uL (ref 1.4–7.7)
NEUTROS PCT: 65.2 % (ref 43.0–77.0)
Platelets: 225 10*3/uL (ref 150.0–400.0)
RBC: 4.98 Mil/uL (ref 3.87–5.11)
RDW: 13.6 % (ref 11.5–15.5)
WBC: 9.3 10*3/uL (ref 4.0–10.5)

## 2015-10-06 LAB — HEPATIC FUNCTION PANEL
ALBUMIN: 4.2 g/dL (ref 3.5–5.2)
ALT: 14 U/L (ref 0–35)
AST: 16 U/L (ref 0–37)
Alkaline Phosphatase: 166 U/L — ABNORMAL HIGH (ref 39–117)
Bilirubin, Direct: 0.1 mg/dL (ref 0.0–0.3)
TOTAL PROTEIN: 7.1 g/dL (ref 6.0–8.3)
Total Bilirubin: 0.6 mg/dL (ref 0.2–1.2)

## 2015-10-06 LAB — TSH: TSH: 1.54 u[IU]/mL (ref 0.35–4.50)

## 2015-10-07 ENCOUNTER — Ambulatory Visit (INDEPENDENT_AMBULATORY_CARE_PROVIDER_SITE_OTHER): Payer: Medicare Other | Admitting: Internal Medicine

## 2015-10-07 ENCOUNTER — Encounter: Payer: Self-pay | Admitting: Internal Medicine

## 2015-10-07 VITALS — BP 140/70 | HR 63 | Temp 98.1°F | Resp 20 | Ht 62.75 in | Wt 156.0 lb

## 2015-10-07 DIAGNOSIS — Z0000 Encounter for general adult medical examination without abnormal findings: Secondary | ICD-10-CM | POA: Diagnosis not present

## 2015-10-07 DIAGNOSIS — I10 Essential (primary) hypertension: Secondary | ICD-10-CM | POA: Diagnosis not present

## 2015-10-07 DIAGNOSIS — M159 Polyosteoarthritis, unspecified: Secondary | ICD-10-CM

## 2015-10-07 DIAGNOSIS — R7302 Impaired glucose tolerance (oral): Secondary | ICD-10-CM | POA: Diagnosis not present

## 2015-10-07 DIAGNOSIS — M8949 Other hypertrophic osteoarthropathy, multiple sites: Secondary | ICD-10-CM

## 2015-10-07 DIAGNOSIS — M150 Primary generalized (osteo)arthritis: Secondary | ICD-10-CM | POA: Diagnosis not present

## 2015-10-07 MED ORDER — PHENTERMINE HCL 30 MG PO CAPS: 30.0000 mg | ORAL_CAPSULE | ORAL | Status: DC

## 2015-10-07 MED ORDER — SERTRALINE HCL 50 MG PO TABS: 50.0000 mg | ORAL_TABLET | Freq: Every day | ORAL | Status: DC

## 2015-10-07 MED ORDER — HYDROCHLOROTHIAZIDE 25 MG PO TABS: 25.0000 mg | ORAL_TABLET | Freq: Every day | ORAL | Status: DC

## 2015-10-07 NOTE — Patient Instructions (Signed)
Limit your sodium (Salt) intake  Please check your blood pressure on a regular basis.  If it is consistently greater than 150/90, please make an office appointment.  Return in one year for follow-up  Take a calcium supplement, plus [phone] units of vitamin D  Please see your eye doctor yearly  Schedule your mammogram.

## 2015-10-07 NOTE — Progress Notes (Signed)
Patient ID: Teresa Frey, female   DOB: [birth year], 82 y.o.   MRN: [id number]  Subjective:    Patient ID: Teresa Frey, female    DOB: 1929/10/27, 82 y.o.   MRN: [id number]  HPI  CC: cpx - doing well.  History of Present Illness:   57 -year-old patient who is seen today for a preventive health examination.  Medical problems include treated hypertension, as well as osteoarthritis. She is followed closely by GI to do a personal history of colonic polyps and a family history of colon cancer. She is doing well today. Last/follow colonoscopy July 2016. Does have annual mammograms and  eye examinations   Here for Medicare AWV:   1. Risk factors based on Past M, S, F history: cardiac risk factors include hypertension only  2. Physical Activities: remains fairly active, although limited somewhat by osteoarthritis  3. Depression/mood- history of mild depression, which has been quite stable on Zoloft  4. Hearing: no hearing deficits  5. ADL's: independent in all aspects of daily living  6. Fall Risk: low  7. Home Safety: no problems identified  8. Height, weight, &visual acuity:height and weight stable. No change in visual acuity  9. Counseling: heart healthy diet regular. Exercise encouraged  10. Labs ordered based on risk factors: laboratory profile, including lipid panel will be reviewed  11. Referral Coordination- none appropriate at this time;  12. Care Plan- heart healthy diet modest weight loss encouraged; calcium supplementation encouraged.  Follow-up mammogram and ophthalmologic exam scheduled in July  13. Cognitive Assessment- alert and oriented, with normal affect. No cognitive dysfunction, handles all executive functions without difficulty  14.  Preventive services will include an annual health examination was screening lab.  Annual eye examinations recommended..  Patient was provided with a written and personalized care plan 15.  Provider list updated.  Includes primary care  gastroenterology, ophthalmology radiology  Allergies (verified):  No Known Drug Allergies   Past History:  Past Medical History:   DJD  Colonic polyps, hx of  Diverticulitis, hx of  Hypertension  Anxiety  Paget's disease, lumbar spine and sacrum   Past Surgical History:   Appendectomy  Total hip replacement left 1989, with revision in 1999; status post right total hip replacement surgery in March 2006  Tubal ligation  Tonsillectomy  colonoscopy May 2007, May 2010  June 2013 July 2016  Family History:   Family History of Colon CA 1st degree relative <60  Family History Lung cancer  Family History of Cardiovascular disorder  Family History of Leukemia  Family History of Hodgkins  father died of Hodgkin's diseasedisease and leukemia at age 62  mother died age 5, pancreatic cancer  Three brothers, one deceased of colon cancer. Another from lung cancer  third brother died with pancreatic cancer   Social History:   Occupation:  Married , spouse has been disabled with Alzheimer's    Review of Systems  Constitutional: Negative.   HENT: Negative for congestion, dental problem, hearing loss, rhinorrhea, sinus pressure, sore throat and tinnitus.   Eyes: Negative for pain, discharge and visual disturbance.  Respiratory: Negative for cough and shortness of breath.   Cardiovascular: Negative for chest pain, palpitations and leg swelling.  Gastrointestinal: Negative for nausea, vomiting, abdominal pain, diarrhea, constipation, blood in stool and abdominal distention.  Genitourinary: Negative for dysuria, urgency, frequency, hematuria, flank pain, vaginal bleeding, vaginal discharge, difficulty urinating, vaginal pain and pelvic pain.  Musculoskeletal: Negative for joint swelling, arthralgias and gait problem.  Skin:  Negative for rash.  Neurological: Negative for dizziness, syncope, speech difficulty, weakness, numbness and headaches.  Hematological: Negative for adenopathy.   Psychiatric/Behavioral: Negative for behavioral problems, dysphoric mood and agitation. The patient is not nervous/anxious.        Objective:   Physical Exam  Constitutional: She is oriented to person, place, and time. She appears well-developed and well-nourished.  HENT:  Head: Normocephalic and atraumatic.  Right Ear: External ear normal.  Left Ear: External ear normal.  Mouth/Throat: Oropharynx is clear and moist.  Eyes: Conjunctivae and EOM are normal.  Neck: Normal range of motion. Neck supple. No JVD present. No thyromegaly present.  Cardiovascular: Normal rate, regular rhythm and normal heart sounds.   No murmur heard. The left posterior tibial pulse was full.  The right posterior tibial pulses faint.  Dorsalis pedis pulses not easily palpable  Pulmonary/Chest: Effort normal and breath sounds normal. She has no wheezes. She has no rales.  Few rales right base  Abdominal: Soft. Bowel sounds are normal. She exhibits no distension and no mass. There is no tenderness. There is no rebound and no guarding.  Musculoskeletal: Normal range of motion. She exhibits no edema or tenderness.  Neurological: She is alert and oriented to person, place, and time. She has normal reflexes. No cranial nerve deficit. She exhibits normal muscle tone. Coordination normal.  Skin: Skin is warm and dry. No rash noted.  Psychiatric: She has a normal mood and affect. Her behavior is normal.          Assessment & Plan:    Preventive health examination Osteoarthritis Hypertension well controlled History of colonic polyps and family history of colon cancer Impaired glucose tolerance  We'll check updated labs. Medications were refilled low-salt diet more exercise encouraged we'll recheck one year Home blood pressure monitoring recommended

## 2015-11-09 DIAGNOSIS — H43813 Vitreous degeneration, bilateral: Secondary | ICD-10-CM | POA: Diagnosis not present

## 2015-11-09 DIAGNOSIS — H04123 Dry eye syndrome of bilateral lacrimal glands: Secondary | ICD-10-CM | POA: Diagnosis not present

## 2015-11-09 DIAGNOSIS — H5203 Hypermetropia, bilateral: Secondary | ICD-10-CM | POA: Diagnosis not present

## 2015-11-09 DIAGNOSIS — H02055 Trichiasis without entropian left lower eyelid: Secondary | ICD-10-CM | POA: Diagnosis not present

## 2015-12-30 DIAGNOSIS — Z1231 Encounter for screening mammogram for malignant neoplasm of breast: Secondary | ICD-10-CM | POA: Diagnosis not present

## 2015-12-30 LAB — HM MAMMOGRAPHY

## 2016-01-03 ENCOUNTER — Encounter: Payer: Self-pay | Admitting: Emergency Medicine

## 2016-01-05 ENCOUNTER — Encounter: Payer: Self-pay | Admitting: Internal Medicine

## 2016-01-05 DIAGNOSIS — N63 Unspecified lump in breast: Secondary | ICD-10-CM | POA: Diagnosis not present

## 2016-01-09 ENCOUNTER — Other Ambulatory Visit: Payer: Self-pay | Admitting: Radiology

## 2016-01-09 DIAGNOSIS — N63 Unspecified lump in breast: Secondary | ICD-10-CM | POA: Diagnosis not present

## 2016-01-09 DIAGNOSIS — Z803 Family history of malignant neoplasm of breast: Secondary | ICD-10-CM | POA: Diagnosis not present

## 2016-01-09 DIAGNOSIS — C50811 Malignant neoplasm of overlapping sites of right female breast: Secondary | ICD-10-CM | POA: Diagnosis not present

## 2016-01-11 ENCOUNTER — Telehealth: Payer: Self-pay | Admitting: *Deleted

## 2016-01-11 ENCOUNTER — Encounter: Payer: Self-pay | Admitting: *Deleted

## 2016-01-11 DIAGNOSIS — C50411 Malignant neoplasm of upper-outer quadrant of right female breast: Secondary | ICD-10-CM | POA: Insufficient documentation

## 2016-01-11 HISTORY — DX: Malignant neoplasm of upper-outer quadrant of right female breast: C50.411

## 2016-01-11 NOTE — Telephone Encounter (Signed)
Confirmed [facility] for 01/18/16 at 0815 .  Instructions and contact information given. 

## 2016-01-13 ENCOUNTER — Other Ambulatory Visit: Payer: Self-pay | Admitting: *Deleted

## 2016-01-13 DIAGNOSIS — C50411 Malignant neoplasm of upper-outer quadrant of right female breast: Secondary | ICD-10-CM

## 2016-01-17 ENCOUNTER — Encounter: Payer: Self-pay | Admitting: General Surgery

## 2016-01-17 NOTE — Progress Notes (Signed)
[place]  [phone] Fax:([phone]) [place] Note   Patient Care Team: Marletta Lor, MD as PCP - General Fanny Skates, MD as Consulting Physician (General Surgery) Truitt Merle, MD as Consulting Physician (Hematology) Gery Pray, MD as Consulting Physician (Radiation Oncology) 01/18/2016  CHIEF COMPLAINTS/PURPOSE OF CONSULTATION:  Newly diagnosed right breast cancer  Oncology History   Breast cancer of upper-outer quadrant of right female breast [facility])   Staging form: Breast, AJCC 7th Edition   - Clinical stage from 01/18/2016: Stage IA (T1b, N0, M0) - Unsigned      Breast cancer of upper-outer quadrant of right female breast ([place])   01/05/2016 Mammogram    Diagnostic mammogram and ultrasound showed a 9 mm mass in the upper outer quadrant of right breast, axillary nodes were negative.     01/09/2016 Initial Diagnosis    Breast cancer of upper-outer quadrant of right female breast ([place])     01/09/2016 Initial Biopsy    R breast 9:00 position biopsy showed invasive ductal carcinoma, G1-2     01/09/2016 Receptors her2    ER 90%+, PR 40%+, HER2-, Ki67 5%       HISTORY OF PRESENTING ILLNESS:  Teresa Frey 82 y.o. female is here because of Her recently diagnosed right breast cancer. She is accompanied by her daughter Teresa Frey to our multidisciplinary clinic today.  This was discovered by screening mammogram. She never had abnormal screening mammogram or breast biopsy in the past. The diagnostic mammogram showed a 6 mm mass in the upper outer quadrant of right breast, it measured 9 mm by ultrasound. A history nodes were negative by ultrasound. She underwent ultrasound guided biopsy on 01/09/2016, which showed invasive ductal carcinoma, ER/PR positive, HER-2 negative.  She lives alone, lives with her husband, who had multiple cancer, on tube feeds, not active. She is his caregiver. She is very independent. She still works full-time for [employer]. She denies any symptoms. Her daughter Teresa Frey was diagnosed with breast cancer at age of 71. Her son died at age of 61 from alcohol. She helps to take of her grandchildren who is 45.   GYN HISTORY  Menarchal:13 LMP: late 1's  Contraceptive: a few years  HRT: no G2P2: her son died in 108's, she has a daughter     MEDICAL HISTORY:  Past Medical History:  Diagnosis Date  . ANXIETY 04/07/2007  . Breast cancer of upper-outer quadrant of right female breast ([place]) 01/11/2016  . COLONIC POLYPS, HX OF 04/02/2007   adenomatous  . DIVERTICULITIS, HX OF 04/02/2007  . Diverticulosis   . DJD (degenerative joint disease)   . Hemorrhoids   . HYPERTENSION 04/02/2007  . OSTEOARTHRITIS, SEVERE 04/07/2007  . 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 WEIGHT GAIN 11/13/2007    SURGICAL HISTORY: Past Surgical History:  Procedure Laterality Date  . APPENDECTOMY    . TONSILLECTOMY    . TOTAL HIP ARTHROPLASTY  1999, left hip    2006 right hip  . TUBAL LIGATION      SOCIAL HISTORY: Social History   Social History  . Marital status: Married    Spouse name: N/A  . Number of children: N/A  . Years of education: N/A   Occupational History  . Not on file.   Social History Main Topics  . Smoking status: Former Smoker    Packs/day: 1.00    Years: 20.00    Quit date: 06/04/1978  .  Smokeless tobacco: Never Used  . Alcohol use 4.2 oz/week    7 Glasses of wine per week  . Drug use: No  . Sexual activity: Not on file   Other Topics Concern  . Not on file   Social History Narrative  . No narrative on file    FAMILY HISTORY: Family History  Problem Relation Age of Onset  . Cancer Mother 7    pancreatic cancer   . Pancreatic cancer Mother 41  . Cancer Father     HD AND LEUKEMIA   . Pancreatic cancer Brother 65  . Lung cancer Brother   . Lung cancer    . Pancreatic cancer    . Colon cancer Brother 32  . Colon cancer Paternal Aunt   . Heart disease Neg Hx     family    . Esophageal cancer Neg Hx   . Rectal cancer Neg Hx   . Stomach cancer Neg Hx     ALLERGIES:  has No Known Allergies.  MEDICATIONS:  Current Outpatient Prescriptions  Medication Sig Dispense Refill  . BIOTIN 5000 PO Take 1 tablet by mouth daily.    . hydrochlorothiazide (HYDRODIURIL) 25 MG tablet Take 1 tablet (25 mg total) by mouth daily. 90 tablet 3  . Multiple Vitamins-Minerals (CENTRUM SILVER ADULT 50+) TABS Take 1 tablet by mouth daily.    . sertraline (ZOLOFT) 50 MG tablet Take 1 tablet (50 mg total) by mouth daily. 90 tablet 3  . vitamin C (ASCORBIC ACID) 500 MG tablet Take 1,000 mg by mouth daily.     . phentermine 30 MG capsule Take 1 capsule (30 mg total) by mouth every morning. (Patient not taking: Reported on 01/18/2016) 60 capsule 2   No current facility-administered medications for this visit.     REVIEW OF SYSTEMS:   Constitutional: Denies fevers, chills or abnormal night sweats Eyes: Denies blurriness of vision, double vision or watery eyes Ears, nose, mouth, throat, and face: Denies mucositis or sore throat Respiratory: Denies cough, dyspnea or wheezes Cardiovascular: Denies palpitation, chest discomfort or lower extremity swelling Gastrointestinal:  Denies nausea, heartburn or change in bowel habits Skin: Denies abnormal skin rashes Lymphatics: Denies new lymphadenopathy or easy bruising Neurological:Denies numbness, tingling or new weaknesses Behavioral/Psych: Mood is stable, no new changes  All other systems were reviewed with the patient and are negative.  PHYSICAL EXAMINATION: ECOG PERFORMANCE STATUS: 0 - Asymptomatic  Vitals:   01/18/16 0922  BP: (!) 145/72  Pulse: 73  Resp: 18  Temp: 98.2 F (36.8 C)   Filed Weights   01/18/16 0922  Weight: 154 lb 4.8 oz (70 kg)    GENERAL:alert, no distress and comfortable SKIN: skin color, texture, turgor are normal, no rashes or significant lesions EYES: normal, conjunctiva are pink and non-injected,  sclera clear OROPHARYNX:no exudate, no erythema and lips, buccal mucosa, and tongue normal  NECK: supple, thyroid normal size, non-tender, without nodularity LYMPH:  no palpable lymphadenopathy in the cervical, axillary or inguinal LUNGS: clear to auscultation and percussion with normal breathing effort HEART: regular rate & rhythm and no murmurs and no lower extremity edema ABDOMEN:abdomen soft, non-tender and normal bowel sounds Musculoskeletal:no cyanosis of digits and no clubbing  PSYCH: alert & oriented x 3 with fluent speech NEURO: no focal motor/sensory deficits Breasts: Breast inspection showed them to be symmetrical with no nipple discharge. Palpation of the breasts and axilla revealed no obvious mass that I could appreciate.    LABORATORY DATA:  I have reviewed  the data as listed CBC Latest Ref Rng & Units 01/18/2016 10/06/2015 07/28/2014  WBC 3.9 - 10.3 10e3/uL 9.8 9.3 10.7(H)  Hemoglobin 11.6 - 15.9 g/dL 14.9 14.8 15.2(H)  Hematocrit 34.8 - 46.6 % 45.1 44.0 45.3  Platelets 145 - 400 10e3/uL 205 225.0 240.0   CMP Latest Ref Rng & Units 01/18/2016 10/06/2015 07/28/2014  Glucose 70 - 140 mg/dl 95 109(H) 101(H)  BUN 7.0 - 26.0 mg/dL 19.7 14 17   Creatinine 0.6 - 1.1 mg/dL 0.8 0.74 0.65  Sodium 136 - 145 mEq/L 142 143 143  Potassium 3.5 - 5.1 mEq/L 3.5 3.7 4.2  Chloride 96 - 112 mEq/L - 105 106  CO2 22 - 29 mEq/L 26 31 32  Calcium 8.4 - 10.4 mg/dL 10.0 9.7 9.7  Total Protein 6.4 - 8.3 g/dL 7.0 7.1 7.0  Total Bilirubin 0.20 - 1.20 mg/dL 0.64 0.6 0.5  Alkaline Phos 40 - 150 U/L 201(H) 166(H) 164(H)  AST 5 - 34 U/L 15 16 17   ALT 0 - 55 U/L 14 14 14    PATHOLOGY REPORT  Diagnosis 01/09/2016 Breast, right, needle core biopsy, 9 o'clock 3 cm fn INVASIVE DUCTAL CARCINOMA, GRADE 1 TO 2  Results: IMMUNOHISTOCHEMICAL AND MORPHOMETRIC ANALYSIS PERFORMED MANUALLY Estrogen Receptor: 90%, POSITIVE, STRONG STAINING INTENSITY Progesterone Receptor: 40%, POSITIVE, STRONG STAINING  INTENSITY Proliferation Marker Ki67: 5%  Results: HER2 - NEGATIVE RATIO OF HER2/CEP17 SIGNALS 1.17 AVERAGE HER2 COPY NUMBER PER CELL 2.45  RADIOGRAPHIC STUDIES: I have personally reviewed the radiological images as listed and agreed with the findings in the report. No results found.  Her outside mammogram and ultrasound image in report were reviewed.  Diagnostic mammogram and ultrasound of right breast 01/05/2016 Breast composition Repeat. There is a new 6 mm irregular high density architecture distortion in the right breast 10:00 middle tabs. It is measured 9 mm meter by ultrasound. Right axillary was negative for adenopathy. The mass is highly suspicious for malignancy.  ASSESSMENT & PLAN: 82 year old Caucasian female, with screening discovered right breast cancer.  1. Breast cancer of upper-outer quadrant of right breast, invasive ductal carcinoma, [id number], stage IA, ER+/PR+/HER2- -I reviewed her mammogram, ultrasound and needle biopsy findings with patient and her daughter in great details. -She has very early stage invasive ductal carcinoma, and is a candidate for lumpectomy. She was seen by breast surgeon Dr. Wyonia Hough today. Due to her advanced stage and the small tumor, we decided not to the sentinel lymph node biopsy. -Giving the low Ki-67 and agree 1 disease, I think this is likely a low risk cancer. I do not recommend adjuvant chemotherapy or Oncotype --Given the strong ER and PR positivity, I recommend her to consider adjuvant aromatase inhibitor to reduce her risk of cancer recurrence,  The potential benefit and side effects, which includes but not limited to, hot flash, skin and vaginal dryness, metabolic changes ( increased blood glucose, cholesterol, weight, etc.), slightly in increased risk of cardiovascular disease, cataracts, muscular and joint discomfort, osteopenia and osteoporosis, etc, were discussed with her in great details. She is 62, but very healthy and active, would  be a candidate for endocrine therapy. She may not have adjuvant breast radiation, we discussed that adjuvant endocrine therapy reduce her risk of local and distant recurrence. Due to her advanced age, if she experiences significant side effects, I would have low threshold to stop her aromatase inhibitor. -She was also seen by a radiation oncologist Dr. Sondra Come today, giving her advanced age, low risk disease, her benefit from adjuvant breast  radiation is small and it may not need it.   2. Genetics -Her daughter was diagnosed with breast cancer at age of 56. -She has strong family history of colon cancer, pancreatic cancer, etc. I'll refer her to genetic counseling to ruled out inch syndrome or other inheritable cancer syndrome.  3. HTN -She will follow-up with her primary care physician  Plan -She planned to have lumpectomy without sentinel lymph node biopsy in the near future -I plan to see her back 3 weeks after her surgery to finalize her adjuvant aromatase inhibitor therapy. She will likely no have adjuvant radiation.   All questions were answered. The patient knows to call the clinic with any problems, questions or concerns. I spent 55 minutes counseling the patient face to face. The total time spent in the appointment was 60 minutes and more than 50% was on counseling.     Truitt Merle, MD 01/18/2016 12:10 PM

## 2016-01-18 ENCOUNTER — Encounter: Payer: Self-pay | Admitting: *Deleted

## 2016-01-18 ENCOUNTER — Other Ambulatory Visit (HOSPITAL_BASED_OUTPATIENT_CLINIC_OR_DEPARTMENT_OTHER): Payer: Medicare Other

## 2016-01-18 ENCOUNTER — Encounter: Payer: Self-pay | Admitting: Genetic Counselor

## 2016-01-18 ENCOUNTER — Ambulatory Visit (HOSPITAL_BASED_OUTPATIENT_CLINIC_OR_DEPARTMENT_OTHER): Payer: Medicare Other | Admitting: Hematology

## 2016-01-18 ENCOUNTER — Encounter: Payer: Self-pay | Admitting: Skilled Nursing Facility1

## 2016-01-18 ENCOUNTER — Other Ambulatory Visit: Payer: Self-pay | Admitting: General Surgery

## 2016-01-18 ENCOUNTER — Ambulatory Visit
Admission: RE | Admit: 2016-01-18 | Discharge: 2016-01-18 | Disposition: A | Discharge status: Home / Self Care | Payer: Medicare Other | Source: Ambulatory Visit | Attending: Radiation Oncology | Admitting: Radiation Oncology

## 2016-01-18 VITALS — BP 145/72 | HR 73 | Temp 98.2°F | Resp 18 | Ht 62.75 in | Wt 154.3 lb

## 2016-01-18 DIAGNOSIS — C50411 Malignant neoplasm of upper-outer quadrant of right female breast: Secondary | ICD-10-CM

## 2016-01-18 DIAGNOSIS — C50811 Malignant neoplasm of overlapping sites of right female breast: Secondary | ICD-10-CM

## 2016-01-18 DIAGNOSIS — Z170 Estrogen receptor positive status [ER+]: Secondary | ICD-10-CM | POA: Diagnosis not present

## 2016-01-18 DIAGNOSIS — Z803 Family history of malignant neoplasm of breast: Secondary | ICD-10-CM | POA: Diagnosis not present

## 2016-01-18 DIAGNOSIS — F419 Anxiety disorder, unspecified: Secondary | ICD-10-CM | POA: Diagnosis not present

## 2016-01-18 DIAGNOSIS — I10 Essential (primary) hypertension: Secondary | ICD-10-CM

## 2016-01-18 LAB — CBC WITH DIFFERENTIAL/PLATELET
BASO%: 0.2 % (ref 0.0–2.0)
BASOS ABS: 0 10*3/uL (ref 0.0–0.1)
EOS ABS: 0.1 10*3/uL (ref 0.0–0.5)
EOS%: 1 % (ref 0.0–7.0)
HEMATOCRIT: 45.1 % (ref 34.8–46.6)
HEMOGLOBIN: 14.9 g/dL (ref 11.6–15.9)
LYMPH#: 2.6 10*3/uL (ref 0.9–3.3)
LYMPH%: 26.8 % (ref 14.0–49.7)
MCH: 30.3 pg (ref 25.1–34.0)
MCHC: 33 g/dL (ref 31.5–36.0)
MCV: 91.9 fL (ref 79.5–101.0)
MONO#: 0.7 10*3/uL (ref 0.1–0.9)
MONO%: 7.4 % (ref 0.0–14.0)
NEUT%: 64.6 % (ref 38.4–76.8)
NEUTROS ABS: 6.3 10*3/uL (ref 1.5–6.5)
PLATELETS: 205 10*3/uL (ref 145–400)
RBC: 4.91 10*6/uL (ref 3.70–5.45)
RDW: 13.5 % (ref 11.2–14.5)
WBC: 9.8 10*3/uL (ref 3.9–10.3)

## 2016-01-18 LAB — COMPREHENSIVE METABOLIC PANEL
ALT: 14 U/L (ref 0–55)
AST: 15 U/L (ref 5–34)
Albumin: 3.7 g/dL (ref 3.5–5.0)
Alkaline Phosphatase: 201 U/L — ABNORMAL HIGH (ref 40–150)
Anion Gap: 11 mEq/L (ref 3–11)
BUN: 19.7 mg/dL (ref 7.0–26.0)
CO2: 26 mEq/L (ref 22–29)
Calcium: 10 mg/dL (ref 8.4–10.4)
Chloride: 105 mEq/L (ref 98–109)
Creatinine: 0.8 mg/dL (ref 0.6–1.1)
EGFR: 66 mL/min/{1.73_m2} — ABNORMAL LOW (ref >90)
Glucose: 95 mg/dl (ref 70–140)
Potassium: 3.5 mEq/L (ref 3.5–5.1)
Sodium: 142 mEq/L (ref 136–145)
Total Bilirubin: 0.64 mg/dL (ref 0.20–1.20)
Total Protein: 7 g/dL (ref 6.4–8.3)

## 2016-01-18 NOTE — Progress Notes (Signed)
For the patient to understand and be given the tools to implement a healthy plant based diet during their cancer diagnosis.  Patient was seen today and found to be in good spirits and accompanied by her daughter. Pts ht 62 in, wt 154 pounds, BMI 27.6. Pts alkaline phosphatase 201, GRF 66. PTs medications: biotin, multivitamin, vitamin C. Pt was inquisative and actively listened. Pt states her husband has throat cancer and does not eat so she does not eat. Pt states she often skips meals and when she does eat they are frozen meals.    A folder of evidence based information with a focus on a plant based diet and general nutrition during cancer was given to the patient.  Dietitian educated the pt on the importance of meal frequency and offered ways to nutritionally enhance her frozen meals. The importance of legitimate, evidence based information was discussed and examples were given. As a part of the continuum of care the cancer dietitian's contact information was given to the patient in the event they would like to have a follow up appointment. Dietitian educated the patient on implementing a plant based diet by incorporating more plant proteins, fruits, and vegetables. As a part of a healthy routine physical activity was discussed.

## 2016-01-18 NOTE — Progress Notes (Signed)
Clinical Social Work [place] Psychosocial Distress Screening [place]  Patient completed distress screening protocol and scored a 0 on the Psychosocial Distress Thermometer which indicates no distress. Clinical Social Worker met with patient and patients daughter in [facility] to assess for distress and other psychosocial needs. Patient stated she was feeling positive after meeting with the treatment team and getting more information on her treatment plan. CSW and patient discussed common feeling and emotions when being diagnosed with cancer, and the importance of support during treatment. CSW informed patient of the support team and support services at [facility]. CSW provided contact information and encouraged patient to call with any questions or concerns.  ONCBCN DISTRESS SCREENING 01/18/2016  Screening Type Initial Screening  Distress experienced in past week (1-10) 0     Johnnye Lana, MSW, LCSW, OSW-C Clinical Social Worker [place] [phone]

## 2016-01-18 NOTE — Progress Notes (Signed)
Radiation Oncology         [phone]) [phone] ________________________________  Initial Outpatient Consultation  Name: Teresa Frey MRN: [id number]  Date: 01/18/2016  DOB: 04-Aug-1929  WU:[id number] Pilar Plate, MD  Fanny Skates, MD   REFERRING PHYSICIAN: Fanny Skates, MD  DIAGNOSIS: The encounter diagnosis was Breast cancer of upper-outer quadrant of right female breast ([place]).   Grade 1-2 invasive ductal carcinoma of the right breast (ER/PR positive, HER2 negative), clinical Stage I  HISTORY OF PRESENT ILLNESS::Teresa Frey is a 82 y.o. female who had a screening mammogram at [facility] on 12/30/15 that noted a 0.6 cm irregular high density architectural distortion in the right breast at the 10 o'clock middle depth. Ultrasound of the axilla was negative. Biopsy of the right breast 9 o'clock position, 3 cm from the nipple, on 01/09/16 revealed grade 1-2 invasive ductal carcinoma (ER 90% positive, PR 40% positive, HER2 negative, Ki67 5%).  The patient presents today in breast clinic to discuss the role of radiation in the management of her disease.  PREVIOUS RADIATION THERAPY: No  PAST MEDICAL HISTORY:  has a past medical history of ANXIETY (04/07/2007); Breast cancer of upper-outer quadrant of right female breast ([place]) (01/11/2016); COLONIC POLYPS, HX OF (04/02/2007); DIVERTICULITIS, HX OF (04/02/2007); Diverticulosis; DJD (degenerative joint disease); Hemorrhoids; HYPERTENSION (04/02/2007); OSTEOARTHRITIS, SEVERE (04/07/2007); Paget's disease of bone; and WEIGHT GAIN (11/13/2007).    PAST SURGICAL HISTORY: Past Surgical History:  Procedure Laterality Date  . APPENDECTOMY    . TONSILLECTOMY    . TOTAL HIP ARTHROPLASTY  1999, left hip    2006 right hip  . TUBAL LIGATION      FAMILY HISTORY: family history includes Cancer in her father; Cancer (age of onset: 66) in her mother; Colon cancer in her paternal aunt; Colon cancer (age of onset: 87) in her brother; Lung cancer in her  brother; Pancreatic cancer (age of onset: 29) in her brother; Pancreatic cancer (age of onset: 48) in her mother.  SOCIAL HISTORY:  reports that she quit smoking about 37 years ago. She has a 20.00 pack-year smoking history. She has never used smokeless tobacco. She reports that she drinks about 4.2 oz of alcohol per week . She reports that she does not use drugs.  ALLERGIES: Review of patient's allergies indicates no known allergies.  MEDICATIONS:  Current Outpatient Prescriptions  Medication Sig Dispense Refill  . BIOTIN 5000 PO Take 1 tablet by mouth daily.    . hydrochlorothiazide (HYDRODIURIL) 25 MG tablet Take 1 tablet (25 mg total) by mouth daily. 90 tablet 3  . Multiple Vitamins-Minerals (CENTRUM SILVER ADULT 50+) TABS Take 1 tablet by mouth daily.    . phentermine 30 MG capsule Take 1 capsule (30 mg total) by mouth every morning. (Patient not taking: Reported on 01/18/2016) 60 capsule 2  . sertraline (ZOLOFT) 50 MG tablet Take 1 tablet (50 mg total) by mouth daily. 90 tablet 3  . vitamin C (ASCORBIC ACID) 500 MG tablet Take 1,000 mg by mouth daily.      No current facility-administered medications for this encounter.     REVIEW OF SYSTEMS:  A 15 point review of systems is documented in the electronic medical record. This was obtained by the nursing staff. However, I reviewed this with the patient to discuss relevant findings and make appropriate changes. No reports of nipple discharge or bleeding prior to diagnosis. No pain within the breast prior to diagnosis  The patient wears glasses, has heartburn, frequent UTIs, psoriasis, and bruise easily.  Gynecologic History  Age at first menstrual period? 13  Are you still having periods? No Approximate date of last period? Age 43  If you no longer have periods: Have you used hormone replacement? No Obstetric History:  How many children have you carried to term? 2 Your age at first live birth? 69  Pregnant now or trying to get pregnant?  No  Have you used birth control pills or hormone shots for contraception? Yes  If so, for how long (or approximate dates)? [place] Maintenance:  Have you ever had a colonoscopy? Yes If yes, date? 20142  Have you ever had a bone density? No  Date of your last PAP smear? Age 4 Date of your FIRST mammogram? Age 25   PHYSICAL EXAM:  Vitals with BMI 01/18/2016  Height 5' 2.75"  Weight 154 lbs 5 oz  BMI 27.0  Systolic 350  Diastolic 72  Pulse 73  Respirations 18   Lungs are clear to auscultation bilaterally. Heart has regular rate and rhythm. No palpable cervical, supraclavicular, or axillary adenopathy. The left breast shows no mass or nipple discharge. Right breast shows some bruising in the lower outer aspect, puncture sign in approximately the 9 o'clock position. No dominant mass appreciated in the breast. No nipple discharge or bleeding.  ECOG = 0  LABORATORY DATA:  Lab Results  Component Value Date   WBC 9.8 01/18/2016   HGB 14.9 01/18/2016   HCT 45.1 01/18/2016   MCV 91.9 01/18/2016   PLT 205 01/18/2016   NEUTROABS 6.3 01/18/2016   Lab Results  Component Value Date   NA 142 01/18/2016   K 3.5 01/18/2016   CL 105 10/06/2015   CO2 26 01/18/2016   GLUCOSE 95 01/18/2016   CREATININE 0.8 01/18/2016   CALCIUM 10.0 01/18/2016      RADIOGRAPHY: No results found.    IMPRESSION: Grade 1-2 invasive ductal carcinoma of the right breast (ER/PR positive, HER2 negative).  She would be an excellent candidate for breast conservation therapy. In light of her age, clinical stage and excellent prognosis, she may not need radiotherapy in the post-operative setting assuming the patient agrees to pursue adjuvant hormonal therapy.  PLAN: The patient will proceed with definitive surgery and would be seen in a post-operative setting for further evaluation.  I spoke to the patient today regarding her diagnosis and options for treatment. We discussed the equivalence in terms of  survival and local failure between mastectomy and breast conservation. We discussed the role of radiation in decreasing local failures in patients who undergo lumpectomy. We discussed the process of simulation and the placement tattoos. We discussed 4-6 weeks of treatment as an outpatient. We discussed the possibility of asymptomatic lung damage. We discussed the low likelihood of secondary malignancies. We discussed the possible side effects including but not limited to skin redness, fatigue, permanent skin darkening, and breast swelling.     ------------------------------------------------  Blair Promise, PhD, MD  This document serves as a record of services personally performed by Gery Pray, MD. It was created on his behalf by Darcus Austin, a trained medical scribe. The creation of this record is based on the scribe's personal observations and the provider's statements to them. This document has been checked and approved by the attending provider.

## 2016-01-23 ENCOUNTER — Telehealth: Payer: Self-pay | Admitting: *Deleted

## 2016-01-23 NOTE — Telephone Encounter (Signed)
  Oncology Nurse Navigator Documentation  Navigator Location: [facility] (01/23/16 1600) Navigator Encounter Type: Telephone (01/23/16 1600) Telephone: Teresa Frey Call;Clinic/[facility] Follow-up (01/23/16 1600)             Barriers/Navigation Needs: No barriers at this time (01/23/16 1600)                          Time Spent with Patient: 15 (01/23/16 1600)

## 2016-01-24 ENCOUNTER — Encounter: Payer: Self-pay | Admitting: Internal Medicine

## 2016-01-27 ENCOUNTER — Encounter: Payer: Self-pay | Admitting: Internal Medicine

## 2016-01-30 ENCOUNTER — Telehealth: Payer: Self-pay | Admitting: Hematology

## 2016-01-30 NOTE — Telephone Encounter (Signed)
Spoke with pt to confirm 9/25 appt per LOS

## 2016-01-31 ENCOUNTER — Encounter (HOSPITAL_BASED_OUTPATIENT_CLINIC_OR_DEPARTMENT_OTHER): Payer: Self-pay | Admitting: *Deleted

## 2016-02-01 ENCOUNTER — Encounter (HOSPITAL_BASED_OUTPATIENT_CLINIC_OR_DEPARTMENT_OTHER)
Admission: RE | Admit: 2016-02-01 | Discharge: 2016-02-01 | Disposition: A | Discharge status: Home / Self Care | Payer: Medicare Other | Source: Ambulatory Visit | Attending: General Surgery | Admitting: General Surgery

## 2016-02-01 ENCOUNTER — Other Ambulatory Visit: Payer: Self-pay

## 2016-02-01 DIAGNOSIS — C50411 Malignant neoplasm of upper-outer quadrant of right female breast: Secondary | ICD-10-CM | POA: Diagnosis not present

## 2016-02-01 DIAGNOSIS — Z87891 Personal history of nicotine dependence: Secondary | ICD-10-CM | POA: Diagnosis not present

## 2016-02-01 DIAGNOSIS — Z803 Family history of malignant neoplasm of breast: Secondary | ICD-10-CM | POA: Diagnosis not present

## 2016-02-01 DIAGNOSIS — M1990 Unspecified osteoarthritis, unspecified site: Secondary | ICD-10-CM | POA: Diagnosis not present

## 2016-02-01 DIAGNOSIS — Z96649 Presence of unspecified artificial hip joint: Secondary | ICD-10-CM | POA: Diagnosis not present

## 2016-02-01 DIAGNOSIS — I10 Essential (primary) hypertension: Secondary | ICD-10-CM | POA: Diagnosis not present

## 2016-02-01 DIAGNOSIS — Z170 Estrogen receptor positive status [ER+]: Secondary | ICD-10-CM | POA: Diagnosis not present

## 2016-02-01 DIAGNOSIS — F419 Anxiety disorder, unspecified: Secondary | ICD-10-CM | POA: Diagnosis not present

## 2016-02-03 ENCOUNTER — Ambulatory Visit (INDEPENDENT_AMBULATORY_CARE_PROVIDER_SITE_OTHER): Payer: Medicare Other | Admitting: Internal Medicine

## 2016-02-03 ENCOUNTER — Encounter: Payer: Self-pay | Admitting: Internal Medicine

## 2016-02-03 VITALS — BP 140/70 | HR 78 | Temp 99.2°F | Resp 20 | Ht 62.75 in | Wt 154.2 lb

## 2016-02-03 DIAGNOSIS — C50411 Malignant neoplasm of upper-outer quadrant of right female breast: Secondary | ICD-10-CM | POA: Diagnosis not present

## 2016-02-03 NOTE — Progress Notes (Signed)
Subjective:    Patient ID: Teresa Frey, female    DOB: 08/29/1929, 82 y.o.   MRN: [id number]  HPI  81 year old patient who presents with a three-day history of cough, chest congestion, rhinorrhea and low-grade fever.  Her granddaughter has had a similar illness.  Denies any chest pain or shortness of breath.  Concerned about delay of lumpectomy scheduled in 5 days for right breast cancer  Past Medical History:  Diagnosis Date  . ANXIETY 04/07/2007  . Breast cancer of upper-outer quadrant of right female breast ([place]) 01/11/2016  . COLONIC POLYPS, HX OF 04/02/2007   adenomatous  . DIVERTICULITIS, HX OF 04/02/2007  . Diverticulosis   . DJD (degenerative joint disease)   . Hemorrhoids   . HYPERTENSION 04/02/2007  . OSTEOARTHRITIS, SEVERE 04/07/2007  . 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 WEIGHT GAIN 11/13/2007     Social History   Social History  . Marital status: Married    Spouse name: N/A  . Number of children: N/A  . Years of education: N/A   Occupational History  . Not on file.   Social History Main Topics  . Smoking status: Former Smoker    Packs/day: 1.00    Years: 20.00    Quit date: 06/04/1978  . Smokeless tobacco: Never Used  . Alcohol use 4.2 oz/week    7 Glasses of wine per week  . Drug use: No  . Sexual activity: Not on file   Other Topics Concern  . Not on file   Social History Narrative  . No narrative on file    Past Surgical History:  Procedure Laterality Date  . APPENDECTOMY    . TONSILLECTOMY    . TOTAL HIP ARTHROPLASTY  1999, left hip    2006 right hip  . TUBAL LIGATION      Family History  Problem Relation Age of Onset  . Cancer Mother 54    pancreatic cancer   . Pancreatic cancer Mother 82  . Cancer Father     HD AND LEUKEMIA   . Pancreatic cancer Brother 85  . Lung cancer Brother   . Lung cancer    . Pancreatic cancer    . Colon cancer Brother 60  . Colon cancer Paternal Aunt   . Heart disease Neg Hx     family    . Esophageal cancer Neg Hx   . Rectal cancer Neg Hx   . Stomach cancer Neg Hx     No Known Allergies  Current Outpatient Prescriptions on File Prior to Visit  Medication Sig Dispense Refill  . BIOTIN 5000 PO Take 1 tablet by mouth daily.    . hydrochlorothiazide (HYDRODIURIL) 25 MG tablet Take 1 tablet (25 mg total) by mouth daily. 90 tablet 3  . Multiple Vitamins-Minerals (CENTRUM SILVER ADULT 50+) TABS Take 1 tablet by mouth daily.    . sertraline (ZOLOFT) 50 MG tablet Take 1 tablet (50 mg total) by mouth daily. 90 tablet 3  . vitamin C (ASCORBIC ACID) 500 MG tablet Take 1,000 mg by mouth daily.     . phentermine 30 MG capsule Take 1 capsule (30 mg total) by mouth every morning. (Patient not taking: Reported on 02/03/2016) 60 capsule 2   No current facility-administered medications on file prior to visit.     BP 140/70 (BP Location: Left Arm, Patient Position: Sitting, Cuff Size: Normal)   Pulse 78   Temp 99.2 F (37.3 C) (Oral)  Resp 20   Ht 5' 2.75" (1.594 m)   Wt 154 lb 4 oz (70 kg)   SpO2 98%   BMI 27.54 kg/m     Review of Systems  Constitutional: Positive for appetite change, fatigue and fever.  HENT: Positive for congestion, postnasal drip and sinus pressure. Negative for dental problem, hearing loss, rhinorrhea, sore throat and tinnitus.   Eyes: Negative for pain, discharge and visual disturbance.  Respiratory: Positive for cough. Negative for shortness of breath.   Cardiovascular: Negative for chest pain, palpitations and leg swelling.  Gastrointestinal: Negative for abdominal distention, abdominal pain, blood in stool, constipation, diarrhea, nausea and vomiting.  Genitourinary: Negative for difficulty urinating, dysuria, flank pain, frequency, hematuria, pelvic pain, urgency, vaginal bleeding, vaginal discharge and vaginal pain.  Musculoskeletal: Negative for arthralgias, gait problem and joint swelling.  Skin: Negative for rash.  Neurological: Negative for  dizziness, syncope, speech difficulty, weakness, numbness and headaches.  Hematological: Negative for adenopathy.  Psychiatric/Behavioral: Negative for agitation, behavioral problems and dysphoric mood. The patient is not nervous/anxious.        Objective:   Physical Exam  Constitutional: She is oriented to person, place, and time. She appears well-developed and well-nourished.  No distress Mild hoarseness Temperature 99.2  HENT:  Head: Normocephalic.  Right Ear: External ear normal.  Left Ear: External ear normal.  Mouth/Throat: Oropharynx is clear and moist.  Eyes: Conjunctivae and EOM are normal. Pupils are equal, round, and reactive to light.  Neck: Normal range of motion. Neck supple. No thyromegaly present.  Cardiovascular: Normal rate, regular rhythm, normal heart sounds and intact distal pulses.   Pulmonary/Chest: Effort normal and breath sounds normal. No respiratory distress. She has no wheezes. She has no rales.  Oxygen saturation 98  Abdominal: Soft. Bowel sounds are normal. She exhibits no mass. There is no tenderness.  Musculoskeletal: Normal range of motion.  Lymphadenopathy:    She has no cervical adenopathy.  Neurological: She is alert and oriented to person, place, and time.  Skin: Skin is warm and dry. No rash noted.  Psychiatric: She has a normal mood and affect. Her behavior is normal.          Assessment & Plan:   Viral URI with cough. Will treat symptomatically  Will report any new or worsening symptoms  Nyoka Cowden

## 2016-02-03 NOTE — Patient Instructions (Signed)
Acute bronchitis symptoms for less than 10 days are generally not helped by antibiotics.  Take over-the-counter expectorants and cough medications such as  Mucinex DM.  Call if there is no improvement in 5 to 7 days or if  you develop worsening cough, fever, or new symptoms, such as shortness of breath or chest pain.  Take 650-1000 mg of Tylenol every 6 hours as needed for pain relief or fever.  Avoid taking more than 3000 mg in a 24-hour period (  This may cause liver damage). 

## 2016-02-03 NOTE — Progress Notes (Signed)
   Subjective:    Patient ID: Teresa Frey, female    DOB: [birth year], 82 y.o.   MRN: [id number]  HPI    Review of Systems     Objective:   Physical Exam        Assessment & Plan:

## 2016-02-03 NOTE — Progress Notes (Signed)
Pre visit review using our clinic review tool, if applicable. No additional management support is needed unless otherwise documented below in the visit note. 

## 2016-02-07 ENCOUNTER — Encounter (HOSPITAL_BASED_OUTPATIENT_CLINIC_OR_DEPARTMENT_OTHER): Payer: Self-pay

## 2016-02-07 DIAGNOSIS — C50811 Malignant neoplasm of overlapping sites of right female breast: Secondary | ICD-10-CM | POA: Diagnosis not present

## 2016-02-07 NOTE — Anesthesia Preprocedure Evaluation (Signed)
Anesthesia Evaluation  Patient identified by MRN, date of birth, ID band Patient awake    Reviewed: Allergy & Precautions, NPO status , Patient's Chart, lab work & pertinent test results  History of Anesthesia Complications Negative for: history of anesthetic complications  Airway Mallampati: II  TM Distance: >3 FB Neck ROM: Full    Dental no notable dental hx. (+) Dental Advisory Given   Pulmonary former smoker,    Pulmonary exam normal breath sounds clear to auscultation       Cardiovascular hypertension, negative cardio ROS Normal cardiovascular exam Rhythm:Regular Rate:Normal     Neuro/Psych PSYCHIATRIC DISORDERS Anxiety negative neurological ROS     GI/Hepatic negative GI ROS, Neg liver ROS,   Endo/Other  negative endocrine ROS  Renal/GU negative Renal ROS  negative genitourinary   Musculoskeletal  (+) Arthritis ,   Abdominal   Peds negative pediatric ROS (+)  Hematology negative hematology ROS (+)   Anesthesia Other Findings   Reproductive/Obstetrics negative OB ROS                             Anesthesia Physical Anesthesia Plan  ASA: II  Anesthesia Plan: General   Post-op Pain Management:    Induction: Intravenous  Airway Management Planned: LMA  Additional Equipment:   Intra-op Plan:   Post-operative Plan: Extubation in OR  Informed Consent: I have reviewed the patients History and Physical, chart, labs and discussed the procedure including the risks, benefits and alternatives for the proposed anesthesia with the patient or authorized representative who has indicated his/her understanding and acceptance.   Dental advisory given  Plan Discussed with: CRNA  Anesthesia Plan Comments:         Anesthesia Quick Evaluation

## 2016-02-07 NOTE — H&P (Signed)
Teresa Frey: [facility] Patient #: [id number] DOB: Sep 07, 1929 Undefined / Language: Teresa Frey / Race: White Female        History of Present Illness  The patient is a 82 year old female who presents with breast cancer. This is a pleasant, reasonably healthy 82 year old Caucasian female, referred by Dr. Antonietta Jewel at [facility] mammography for evaluation and management of a small invasive ductal carcinoma of the right breast, upper outer quadrant. Dr. Amador Cunas is her PCP. She is seen in the [facility] today by Dr. Roselind Frey, Dr. Mosetta Frey, and me.  She has no significant prior breast problems. Gets annual mammograms. Recent mammogram shows a 6 mm area of distortion in the right breast at the 9 to 10 o'clock position, middle depth, 3 cm from the nipple. Ultrasound of the right axilla is negative. Image guided biopsy shows grade 1 invasive ductal carcinoma, estrogen receptor 90%. Progesterone receptor 40%. Ki-67 5%, HER-2/neu negative.  Comorbidities are minimal. She is healthy appearing. Has hypertension. Has anxiety and takes Zoloft which helps. History of appendectomy, total hip replacement, tubal ligation.  Family history reveals that her daughter had breast cancer at age 63 and her daughter is with her today. No other family members with breast or ovarian cancer. Mother and brother had pancreatic cancer. Brother died of colon cancer. A brother had lung disease.  Socially the patient is married but her husband has fairly significant dementia. He lives at home with the patient and the patient and her daughter share duties with care and tube feeds. Patient has 2 children. One daughter lives with her today and a son died of alcohol abuse. The patient is a former smoker having quit in 1989. She drinks alcohol occasionally.  I spent some time talking with her about lumpectomy, mastectomy, sentinel node biopsy, plastic surgical issues. At the end of the discussion she  would like a lumpectomy and I think she is an excellent candidate for that. She is not a candidate for chemotherapy and has a clinically negative axilla so we do not think she needs a lymph node biopsy. She'll be scheduled for right breast lumpectomy  She will be scheduled for right breast lumpectomy with radioactive seed localization. I discussed the indications, details, techniques, and numerous risk of the surgery with her and her daughter. They're both aware of the risk of bleeding, infection, reoperation for positive margins, skin necrosis, cosmetic deformity, chronic pain, cardiac pulmonary and thromboembolic problems. She understands these issues well. All of her questions are answered. She agrees with this plan. We will proceed with scheduling. She will follow-up with Dr. Mosetta Frey and Dr. Roselind Frey postop to decide whether she needs antiestrogen therapy, radiation therapy, or both.   Other Problems  Arthritis Hemorrhoids High blood pressure Lump In Breast  Appendectomy Breast Biopsy Right. Hip Surgery Bilateral. Tonsillectomy  Diagnostic Studies History  Colonoscopy 1-5 years ago Mammogram within last year Pap Smear >5 years ago  Medication History  Medications Reconciled  Social History  Alcohol use Moderate alcohol use. Caffeine use Coffee. No drug use Tobacco use Former smoker.  Family History  Cancer Brother, Father. Colon Cancer Brother. Heart Disease Brother. Malignant Neoplasm Of Pancreas Brother, Mother. Respiratory Condition Brother.  Pregnancy / Birth History  Age at menarche 13 years. Age of menopause 51-55 Contraceptive History Oral contraceptives. Gravida 2 Length (months) of breastfeeding 3-6 Maternal age 55-20 Para 2    Review of Systems  General Not Present- Appetite Loss, Chills, Fatigue, Fever, Night Sweats, Weight Gain and Weight  Loss. Skin Not Present- Change in Wart/Mole, Dryness, Hives, Jaundice, New  Lesions, Non-Healing Wounds, Rash and Ulcer. HEENT Present- Wears glasses/contact lenses. Not Present- Earache, Hearing Loss, Hoarseness, Nose Bleed, Oral Ulcers, Ringing in the Ears, Seasonal Allergies, Sinus Pain, Sore Throat, Visual Disturbances and Yellow Eyes. Respiratory Present- Snoring. Not Present- Bloody sputum, Chronic Cough, Difficulty Breathing and Wheezing. Breast Present- Breast Mass. Not Present- Breast Pain, Nipple Discharge and Skin Changes. Cardiovascular Present- Leg Cramps. Not Present- Chest Pain, Difficulty Breathing Lying Down, Palpitations, Rapid Heart Rate, Shortness of Breath and Swelling of Extremities. Gastrointestinal Present- Hemorrhoids. Not Present- Abdominal Pain, Bloating, Bloody Stool, Change in Bowel Habits, Chronic diarrhea, Constipation, Difficulty Swallowing, Excessive gas, Gets full quickly at meals, Indigestion, Nausea, Rectal Pain and Vomiting. Female Genitourinary Not Present- Frequency, Nocturia, Painful Urination, Pelvic Pain and Urgency. Musculoskeletal Not Present- Back Pain, Joint Pain, Joint Stiffness, Muscle Pain, Muscle Weakness and Swelling of Extremities. Neurological Not Present- Decreased Memory, Fainting, Headaches, Numbness, Seizures, Tingling, Tremor, Trouble walking and Weakness. Psychiatric Not Present- Anxiety, Bipolar, Change in Sleep Pattern, Depression, Fearful and Frequent crying. Endocrine Not Present- Cold Intolerance, Excessive Hunger, Hair Changes, Heat Intolerance, Hot flashes and New Diabetes. Hematology Not Present- Blood Thinners, Easy Bruising, Excessive bleeding, Gland problems, HIV and Persistent Infections.    Physical Exam General Mental Status-Alert. General Appearance-Consistent with stated age. Hydration-Well hydrated. Voice-Normal.  Head and Neck Head-normocephalic, atraumatic with no lesions or palpable masses. Trachea-midline. Thyroid Gland Characteristics - normal size and  consistency.  Eye Eyeball - Bilateral-Extraocular movements intact. Sclera/Conjunctiva - Bilateral-No scleral icterus.  Chest and Lung Exam Chest and lung exam reveals -quiet, even and easy respiratory effort with no use of accessory muscles and on auscultation, normal breath sounds, no adventitious sounds and normal vocal resonance. Inspection Chest Wall - Normal. Back - normal.  Breast Note: Breasts are medium size. Soft and easy to examine. Minimal bruising right breast. No palpable mass in either breast. No axillary adenopathy on either side. No other skin changes.   Cardiovascular Cardiovascular examination reveals -normal heart sounds, regular rate and rhythm with no murmurs and normal pedal pulses bilaterally.  Abdomen Inspection Inspection of the abdomen reveals - No Hernias. Palpation/Percussion Palpation and Percussion of the abdomen reveal - Soft, Non Tender, No Rebound tenderness, No Rigidity (guarding) and No hepatosplenomegaly. Auscultation Auscultation of the abdomen reveals - Bowel sounds normal. Note: Right lower quadrant scar from appendectomy. Lower midline scar from tubal ligation. No hernias.   Neurologic Neurologic evaluation reveals -alert and oriented x 3 with no impairment of recent or remote memory. Mental Status-Normal.  Musculoskeletal Normal Exam - Left-Upper Extremity Strength Normal and Lower Extremity Strength Normal. Normal Exam - Right-Upper Extremity Strength Normal and Lower Extremity Strength Normal.  Lymphatic Head & Neck  General Head & Neck Lymphatics: Bilateral - Description - Normal. Axillary  General Axillary Region: Bilateral - Description - Normal. Tenderness - Non Tender. Femoral & Inguinal  Generalized Femoral & Inguinal Lymphatics: Bilateral - Description - Normal. Tenderness - Non Tender.    Assessment & Plan PRIMARY CANCER OF UPPER OUTER QUADRANT OF RIGHT FEMALE BREAST (C50.411)    Your recent  imaging studies and biopsy reveal a small invasive ductal carcinoma of the right breast, upper outer quadrant. The tumor is 6 mm in size. Estrogen receptor positive. HER-2 negative.  The next step in your care is surgery. Decisions about radiation therapy and anti-estrogen pills will come later. We have discussed the procedures of lumpectomy, sentinel node biopsy, mastectomy, and plastic  surgical reconstruction. You prefer lumpectomy and I think you are an excellent candidate for that. We do not think that you would benefit from any lymph node biopsy  You will be scheduled for right breast partial mastectomy with radioactive seed localization We have discussed the indications, techniques, and numerous risks of this surgery with you and your daughter.  My office will call you in the next day or 2 to schedule the surgery.  Please read the printed information that we gave you  HYPERTENSION, BENIGN (I10) FAMILY HISTORY OF BREAST CANCER IN FEMALE (Z80.3) Impression: Daughter diagnosed age 80. Survivor. ANXIETY (F41.9) Impression: better on zoloft    Adrien Shankar M. Derrell Lolling, M.D., [facility] General and Minimally invasive Surgery Breast and Colorectal Surgery Office:   [phone] Pager:   [phone]

## 2016-02-08 ENCOUNTER — Encounter (HOSPITAL_BASED_OUTPATIENT_CLINIC_OR_DEPARTMENT_OTHER)
Admission: RE | Disposition: A | Discharge status: Home / Self Care | Payer: Self-pay | Source: Ambulatory Visit | Attending: General Surgery

## 2016-02-08 ENCOUNTER — Ambulatory Visit (HOSPITAL_BASED_OUTPATIENT_CLINIC_OR_DEPARTMENT_OTHER): Payer: Medicare Other | Admitting: Anesthesiology

## 2016-02-08 ENCOUNTER — Encounter (HOSPITAL_BASED_OUTPATIENT_CLINIC_OR_DEPARTMENT_OTHER): Payer: Self-pay | Admitting: Certified Registered"

## 2016-02-08 ENCOUNTER — Ambulatory Visit (HOSPITAL_BASED_OUTPATIENT_CLINIC_OR_DEPARTMENT_OTHER)
Admission: RE | Admit: 2016-02-08 | Discharge: 2016-02-08 | Disposition: A | Discharge status: Home / Self Care | Payer: Medicare Other | Source: Ambulatory Visit | Attending: General Surgery | Admitting: General Surgery

## 2016-02-08 DIAGNOSIS — C50811 Malignant neoplasm of overlapping sites of right female breast: Secondary | ICD-10-CM | POA: Diagnosis not present

## 2016-02-08 DIAGNOSIS — C50411 Malignant neoplasm of upper-outer quadrant of right female breast: Secondary | ICD-10-CM | POA: Diagnosis present

## 2016-02-08 DIAGNOSIS — I10 Essential (primary) hypertension: Secondary | ICD-10-CM | POA: Diagnosis not present

## 2016-02-08 DIAGNOSIS — Z803 Family history of malignant neoplasm of breast: Secondary | ICD-10-CM | POA: Insufficient documentation

## 2016-02-08 DIAGNOSIS — F419 Anxiety disorder, unspecified: Secondary | ICD-10-CM | POA: Diagnosis not present

## 2016-02-08 DIAGNOSIS — Z87891 Personal history of nicotine dependence: Secondary | ICD-10-CM | POA: Diagnosis not present

## 2016-02-08 DIAGNOSIS — M1990 Unspecified osteoarthritis, unspecified site: Secondary | ICD-10-CM | POA: Insufficient documentation

## 2016-02-08 DIAGNOSIS — Z96649 Presence of unspecified artificial hip joint: Secondary | ICD-10-CM | POA: Diagnosis not present

## 2016-02-08 DIAGNOSIS — Z170 Estrogen receptor positive status [ER+]: Secondary | ICD-10-CM | POA: Insufficient documentation

## 2016-02-08 HISTORY — PX: BREAST LUMPECTOMY WITH RADIOACTIVE SEED LOCALIZATION: SHX6424

## 2016-02-08 SURGERY — BREAST LUMPECTOMY WITH RADIOACTIVE SEED LOCALIZATION
Anesthesia: General | Site: Breast | Laterality: Right

## 2016-02-08 MED ORDER — ONDANSETRON HCL 4 MG/2ML IJ SOLN
INTRAMUSCULAR | Status: DC | PRN
  Administered 2016-02-08: 4 mg via INTRAVENOUS

## 2016-02-08 MED ORDER — ONDANSETRON HCL 4 MG/2ML IJ SOLN
INTRAMUSCULAR | Status: AC
Start: 2016-02-08 — End: 2016-02-08
  Filled 2016-02-08: qty 2

## 2016-02-08 MED ORDER — PROPOFOL 10 MG/ML IV BOLUS
INTRAVENOUS | Status: DC | PRN
  Administered 2016-02-08: 100 mg via INTRAVENOUS

## 2016-02-08 MED ORDER — CHLORHEXIDINE GLUCONATE CLOTH 2 % EX PADS: 6.0000 | MEDICATED_PAD | Freq: Once | CUTANEOUS | Status: DC

## 2016-02-08 MED ORDER — GABAPENTIN 300 MG PO CAPS
ORAL_CAPSULE | ORAL | Status: AC
  Filled 2016-02-08: qty 1

## 2016-02-08 MED ORDER — CEFAZOLIN SODIUM-DEXTROSE 2-4 GM/100ML-% IV SOLN
INTRAVENOUS | Status: AC
  Filled 2016-02-08: qty 100

## 2016-02-08 MED ORDER — DEXAMETHASONE SODIUM PHOSPHATE 10 MG/ML IJ SOLN
INTRAMUSCULAR | Status: AC
  Filled 2016-02-08: qty 1

## 2016-02-08 MED ORDER — FENTANYL CITRATE (PF) 100 MCG/2ML IJ SOLN
INTRAMUSCULAR | Status: AC
  Filled 2016-02-08: qty 2

## 2016-02-08 MED ORDER — LIDOCAINE HCL (CARDIAC) 20 MG/ML IV SOLN
INTRAVENOUS | Status: DC | PRN
  Administered 2016-02-08: 60 mg via INTRAVENOUS

## 2016-02-08 MED ORDER — MIDAZOLAM HCL 2 MG/2ML IJ SOLN: 1.0000 mg | INTRAMUSCULAR | Status: DC | PRN

## 2016-02-08 MED ORDER — GLYCOPYRROLATE 0.2 MG/ML IJ SOLN: 0.2000 mg | Freq: Once | INTRAMUSCULAR | Status: DC | PRN

## 2016-02-08 MED ORDER — CEFAZOLIN SODIUM-DEXTROSE 2-4 GM/100ML-% IV SOLN
2.0000 g | INTRAVENOUS | Status: AC
  Administered 2016-02-08: 2 g via INTRAVENOUS

## 2016-02-08 MED ORDER — FENTANYL CITRATE (PF) 100 MCG/2ML IJ SOLN
50.0000 ug | INTRAMUSCULAR | Status: DC | PRN
  Administered 2016-02-08: 25 ug via INTRAVENOUS
  Administered 2016-02-08: 50 ug via INTRAVENOUS

## 2016-02-08 MED ORDER — HYDROCODONE-ACETAMINOPHEN 5-325 MG PO TABS
1.0000 | ORAL_TABLET | Freq: Once | ORAL | Status: AC
  Administered 2016-02-08: 1 via ORAL

## 2016-02-08 MED ORDER — BUPIVACAINE-EPINEPHRINE (PF) 0.5% -1:200000 IJ SOLN
INTRAMUSCULAR | Status: DC | PRN
  Administered 2016-02-08: 10 mL

## 2016-02-08 MED ORDER — GABAPENTIN 300 MG PO CAPS
300.0000 mg | ORAL_CAPSULE | ORAL | Status: AC
  Administered 2016-02-08: 300 mg via ORAL

## 2016-02-08 MED ORDER — BUPIVACAINE-EPINEPHRINE (PF) 0.5% -1:200000 IJ SOLN
INTRAMUSCULAR | Status: AC
Start: 2016-02-08 — End: 2016-02-08
  Filled 2016-02-08: qty 30

## 2016-02-08 MED ORDER — LIDOCAINE 2% (20 MG/ML) 5 ML SYRINGE
INTRAMUSCULAR | Status: AC
  Filled 2016-02-08: qty 5

## 2016-02-08 MED ORDER — ONDANSETRON HCL 4 MG/2ML IJ SOLN: 4.0000 mg | Freq: Once | INTRAMUSCULAR | Status: DC | PRN

## 2016-02-08 MED ORDER — HYDROCODONE-ACETAMINOPHEN 5-325 MG PO TABS
ORAL_TABLET | ORAL | Status: AC
  Filled 2016-02-08: qty 1

## 2016-02-08 MED ORDER — ACETAMINOPHEN 500 MG PO TABS
ORAL_TABLET | ORAL | Status: AC
  Filled 2016-02-08: qty 2

## 2016-02-08 MED ORDER — FENTANYL CITRATE (PF) 100 MCG/2ML IJ SOLN: 25.0000 ug | INTRAMUSCULAR | Status: DC | PRN

## 2016-02-08 MED ORDER — PROPOFOL 500 MG/50ML IV EMUL
INTRAVENOUS | Status: AC
  Filled 2016-02-08: qty 50

## 2016-02-08 MED ORDER — DEXAMETHASONE SODIUM PHOSPHATE 4 MG/ML IJ SOLN
INTRAMUSCULAR | Status: DC | PRN
  Administered 2016-02-08: 10 mg via INTRAVENOUS

## 2016-02-08 MED ORDER — LIDOCAINE 2% (20 MG/ML) 5 ML SYRINGE
INTRAMUSCULAR | Status: AC
Start: 2016-02-08 — End: 2016-02-08
  Filled 2016-02-08: qty 5

## 2016-02-08 MED ORDER — ONDANSETRON HCL 4 MG/2ML IJ SOLN
INTRAMUSCULAR | Status: AC
  Filled 2016-02-08: qty 2

## 2016-02-08 MED ORDER — SCOPOLAMINE 1 MG/3DAYS TD PT72: 1.0000 | MEDICATED_PATCH | Freq: Once | TRANSDERMAL | Status: DC | PRN

## 2016-02-08 MED ORDER — ACETAMINOPHEN 500 MG PO TABS
1000.0000 mg | ORAL_TABLET | ORAL | Status: AC
  Administered 2016-02-08: 1000 mg via ORAL

## 2016-02-08 MED ORDER — LACTATED RINGERS IV SOLN
INTRAVENOUS | Status: DC
  Administered 2016-02-08 (×2): via INTRAVENOUS

## 2016-02-08 MED ORDER — HYDROCODONE-ACETAMINOPHEN 5-325 MG PO TABS: 1.0000 | ORAL_TABLET | ORAL | 0 refills | Status: DC | PRN

## 2016-02-08 SURGICAL SUPPLY — 66 items
ADH SKN CLS APL DERMABOND .7 (GAUZE/BANDAGES/DRESSINGS) ×1
APL SKNCLS STERI-STRIP NONHPOA (GAUZE/BANDAGES/DRESSINGS)
APPLIER CLIP 9.375 MED OPEN (MISCELLANEOUS) ×2
APR CLP MED 9.3 20 MLT OPN (MISCELLANEOUS) ×1
BENZOIN TINCTURE PRP APPL 2/3 (GAUZE/BANDAGES/DRESSINGS) IMPLANT
BINDER BREAST LRG (GAUZE/BANDAGES/DRESSINGS) ×1 IMPLANT
BINDER BREAST MEDIUM (GAUZE/BANDAGES/DRESSINGS) IMPLANT
BINDER BREAST XLRG (GAUZE/BANDAGES/DRESSINGS) IMPLANT
BINDER BREAST XXLRG (GAUZE/BANDAGES/DRESSINGS) IMPLANT
BLADE HEX COATED 2.75 (ELECTRODE) ×2 IMPLANT
BLADE SURG 10 STRL SS (BLADE) IMPLANT
BLADE SURG 15 STRL LF DISP TIS (BLADE) ×1 IMPLANT
BLADE SURG 15 STRL SS (BLADE) ×2
CANISTER SUC SOCK COL 7IN (MISCELLANEOUS) IMPLANT
CANISTER SUCT 1200ML W/VALVE (MISCELLANEOUS) ×2 IMPLANT
CHLORAPREP W/TINT 26ML (MISCELLANEOUS) ×2 IMPLANT
CLIP APPLIE 9.375 MED OPEN (MISCELLANEOUS) IMPLANT
COVER BACK TABLE 60X90IN (DRAPES) ×2 IMPLANT
COVER MAYO STAND STRL (DRAPES) ×2 IMPLANT
COVER PROBE W GEL 5X96 (DRAPES) ×2 IMPLANT
DECANTER SPIKE VIAL GLASS SM (MISCELLANEOUS) IMPLANT
DERMABOND ADVANCED (GAUZE/BANDAGES/DRESSINGS) ×1
DERMABOND ADVANCED .7 DNX12 (GAUZE/BANDAGES/DRESSINGS) ×1 IMPLANT
DEVICE DUBIN W/COMP PLATE 8390 (MISCELLANEOUS) ×2 IMPLANT
DRAPE LAPAROSCOPIC ABDOMINAL (DRAPES) ×2 IMPLANT
DRAPE UTILITY XL STRL (DRAPES) ×2 IMPLANT
DRSG PAD ABDOMINAL 8X10 ST (GAUZE/BANDAGES/DRESSINGS) IMPLANT
ELECT REM PT RETURN 9FT ADLT (ELECTROSURGICAL) ×2
ELECTRODE REM PT RTRN 9FT ADLT (ELECTROSURGICAL) ×1 IMPLANT
GLOVE BIOGEL M STRL SZ7.5 (GLOVE) ×1 IMPLANT
GLOVE BIOGEL PI IND STRL 6.5 (GLOVE) IMPLANT
GLOVE BIOGEL PI IND STRL 8 (GLOVE) IMPLANT
GLOVE BIOGEL PI INDICATOR 6.5 (GLOVE) ×1
GLOVE BIOGEL PI INDICATOR 8 (GLOVE) ×1
GLOVE EUDERMIC 7 POWDERFREE (GLOVE) ×2 IMPLANT
GOWN STRL REUS W/ TWL LRG LVL3 (GOWN DISPOSABLE) ×1 IMPLANT
GOWN STRL REUS W/ TWL XL LVL3 (GOWN DISPOSABLE) ×1 IMPLANT
GOWN STRL REUS W/TWL LRG LVL3 (GOWN DISPOSABLE)
GOWN STRL REUS W/TWL XL LVL3 (GOWN DISPOSABLE) ×4
ILLUMINATOR WAVEGUIDE N/F (MISCELLANEOUS) IMPLANT
KIT MARKER MARGIN INK (KITS) ×2 IMPLANT
LIGHT WAVEGUIDE WIDE FLAT (MISCELLANEOUS) IMPLANT
NDL HYPO 25X1 1.5 SAFETY (NEEDLE) ×1 IMPLANT
NEEDLE HYPO 25X1 1.5 SAFETY (NEEDLE) ×2 IMPLANT
NS IRRIG 1000ML POUR BTL (IV SOLUTION) ×2 IMPLANT
PACK BASIN DAY SURGERY FS (CUSTOM PROCEDURE TRAY) ×2 IMPLANT
PENCIL BUTTON HOLSTER BLD 10FT (ELECTRODE) ×2 IMPLANT
SHEET MEDIUM DRAPE 40X70 STRL (DRAPES) IMPLANT
SLEEVE SCD COMPRESS KNEE MED (MISCELLANEOUS) ×2 IMPLANT
SPONGE GAUZE 4X4 12PLY STER LF (GAUZE/BANDAGES/DRESSINGS) IMPLANT
SPONGE LAP 18X18 X RAY DECT (DISPOSABLE) IMPLANT
SPONGE LAP 4X18 X RAY DECT (DISPOSABLE) ×2 IMPLANT
STRIP CLOSURE SKIN 1/2X4 (GAUZE/BANDAGES/DRESSINGS) IMPLANT
SUT ETHILON 3 0 FSL (SUTURE) IMPLANT
SUT MNCRL AB 4-0 PS2 18 (SUTURE) ×2 IMPLANT
SUT SILK 2 0 SH (SUTURE) ×2 IMPLANT
SUT VIC AB 2-0 CT1 27 (SUTURE)
SUT VIC AB 2-0 CT1 TAPERPNT 27 (SUTURE) IMPLANT
SUT VIC AB 3-0 SH 27 (SUTURE)
SUT VIC AB 3-0 SH 27X BRD (SUTURE) IMPLANT
SUT VICRYL 3-0 CR8 SH (SUTURE) ×2 IMPLANT
SYRINGE 10CC LL (SYRINGE) ×2 IMPLANT
TOWEL OR 17X24 6PK STRL BLUE (TOWEL DISPOSABLE) ×2 IMPLANT
TOWEL OR NON WOVEN STRL DISP B (DISPOSABLE) IMPLANT
TUBE CONNECTING 20X1/4 (TUBING) ×2 IMPLANT
YANKAUER SUCT BULB TIP NO VENT (SUCTIONS) ×2 IMPLANT

## 2016-02-08 NOTE — Interval H&P Note (Signed)
History and Physical Interval Note:  02/08/2016 9:31 AM  Teresa Frey  has presented today for surgery, with the diagnosis of INVASIVE RIGHT BREAST CANCER, UPPER OUTER QUADRANT  The various methods of treatment have been discussed with the patient and family. After consideration of risks, benefits and other options for treatment, the patient has consented to  Procedure(s): RIGHT BREAST LUMPECTOMY WITH RADIOACTIVE SEED LOCALIZATION (Right) as a surgical intervention .  The patient's history has been reviewed, patient examined, no change in status, stable for surgery.  I have reviewed the patient's chart and labs.  Questions were answered to the patient's satisfaction.     Adin Hector

## 2016-02-08 NOTE — Discharge Instructions (Signed)
[place] Office Phone Number [phone]  BREAST BIOPSY/ PARTIAL MASTECTOMY: POST OP INSTRUCTIONS  Always review your discharge instruction sheet given to you by the facility where your surgery was performed.  IF YOU HAVE DISABILITY OR FAMILY LEAVE FORMS, YOU MUST BRING THEM TO THE OFFICE FOR PROCESSING.  DO NOT GIVE THEM TO YOUR DOCTOR.  1. A prescription for pain medication may be given to you upon discharge.  Take your pain medication as prescribed, if needed.  If narcotic pain medicine is not needed, then you may take acetaminophen (Tylenol) or ibuprofen (Advil) as needed. 2. Take your usually prescribed medications unless otherwise directed 3. If you need a refill on your pain medication, please contact your pharmacy.  They will contact our office to request authorization.  Prescriptions will not be filled after 5pm or on week-ends. 4. You should eat very light the first 24 hours after surgery, such as soup, crackers, pudding, etc.  Resume your normal diet the day after surgery. 5. Most patients will experience some swelling and bruising in the breast.  Ice packs and a good support bra will help.  Swelling and bruising can take several days to resolve.     Post Anesthesia Home Care Instructions  Activity: Get plenty of rest for the remainder of the day. A responsible adult should stay with you for 24 hours following the procedure.  For the next 24 hours, DO NOT: -Drive a car -Paediatric nurse -Drink alcoholic beverages -Take any medication unless instructed by your physician -Make any legal decisions or sign important papers.  Meals: Start with liquid foods such as gelatin or soup. Progress to regular foods as tolerated. Avoid greasy, spicy, heavy foods. If nausea and/or vomiting occur, drink only clear liquids until the nausea and/or vomiting subsides. Call your physician if vomiting continues.  Special Instructions/Symptoms: Your throat may feel dry or sore  from the anesthesia or the breathing tube placed in your throat during surgery. If this causes discomfort, gargle with warm salt water. The discomfort should disappear within 24 hours.  If you had a scopolamine patch placed behind your ear for the management of post- operative nausea and/or vomiting:  1. The medication in the patch is effective for 72 hours, after which it should be removed.  Wrap patch in a tissue and discard in the trash. Wash hands thoroughly with soap and water. 2. You may remove the patch earlier than 72 hours if you experience unpleasant side effects which may include dry mouth, dizziness or visual disturbances. 3. Avoid touching the patch. Wash your hands with soap and water after contact with the patch.    6. It is common to experience some constipation if taking pain medication after surgery.  Increasing fluid intake and taking a stool softener will usually help or prevent this problem from occurring.  A mild laxative (Milk of Magnesia or Miralax) should be taken according to package directions if there are no bowel movements after 48 hours. 7. Unless discharge instructions indicate otherwise, you may remove your bandages 24-48 hours after surgery, and you may shower at that time.  You may have steri-strips (small skin tapes) in place directly over the incision.  These strips should be left on the skin for 7-10 days.  If your surgeon used skin glue on the incision, you may shower in 24 hours.  The glue will flake off over the next 2-3 weeks.  Any sutures or staples will be removed at the office during your follow-up visit.  8. ACTIVITIES:  You may resume regular daily activities (gradually increasing) beginning the next day.  Wearing a good support bra or sports bra minimizes pain and swelling.  You may have sexual intercourse when it is comfortable. °a. You may drive when you no longer are taking prescription pain medication, you can comfortably wear a seatbelt, and you can  safely maneuver your car and apply brakes. °b. RETURN TO WORK:  ______________________________________________________________________________________ °9. You should see your doctor in the office for a follow-up appointment approximately two weeks after your surgery.  Your doctor’s nurse will typically make your follow-up appointment when she calls you with your pathology report.  Expect your pathology report 2-3 business days after your surgery.  You may call to check if you do not hear from us after three days. °10. OTHER INSTRUCTIONS: _______________________________________________________________________________________________ _____________________________________________________________________________________________________________________________________ °_____________________________________________________________________________________________________________________________________ °_____________________________________________________________________________________________________________________________________ ° °WHEN TO CALL YOUR DOCTOR: °1. Fever over 101.0 °2. Nausea and/or vomiting. °3. Extreme swelling or bruising. °4. Continued bleeding from incision. °5. Increased pain, redness, or drainage from the incision. ° °The clinic staff is available to answer your questions during regular business hours.  Please don’t hesitate to call and ask to speak to one of the nurses for clinical concerns.  If you have a medical emergency, go to the nearest emergency room or call 911.  A surgeon from [facility] is always on call at the hospital. ° °For further questions, please visit [email or web address]  °

## 2016-02-08 NOTE — Anesthesia Procedure Notes (Signed)
Procedure Name: LMA Insertion Date/Time: 02/08/2016 10:12 AM Performed by: Burnie Therien D Pre-anesthesia Checklist: Patient identified, Emergency Drugs available, Suction available and Patient being monitored Patient Re-evaluated:Patient Re-evaluated prior to inductionOxygen Delivery Method: Circle system utilized Preoxygenation: Pre-oxygenation with 100% oxygen Intubation Type: IV induction Ventilation: Mask ventilation without difficulty LMA: LMA inserted LMA Size: 4.0 Number of attempts: 1 Airway Equipment and Method: Bite block Placement Confirmation: positive ETCO2 Tube secured with: Tape Dental Injury: Teeth and Oropharynx as per pre-operative assessment

## 2016-02-08 NOTE — Anesthesia Postprocedure Evaluation (Signed)
Anesthesia Post Note  Patient: Teresa Frey  Procedure(s) Performed: Procedure(s) (LRB): RIGHT BREAST LUMPECTOMY WITH RADIOACTIVE SEED LOCALIZATION (Right)  Patient location during evaluation: PACU Anesthesia Type: General Level of consciousness: awake and alert Pain management: pain level controlled Vital Signs Assessment: post-procedure vital signs reviewed and stable Respiratory status: spontaneous breathing, nonlabored ventilation, respiratory function stable and patient connected to nasal cannula oxygen Cardiovascular status: blood pressure returned to baseline and stable Postop Assessment: no signs of nausea or vomiting Anesthetic complications: no    Last Vitals:  Vitals:   02/08/16 1145 02/08/16 1200  BP: 140/74 (!) 151/72  Pulse: 71 (!) 57  Resp: 18 12  Temp:      Last Pain:  Vitals:   02/08/16 1145  TempSrc:   PainSc: 0-No pain                 Sten Dematteo JENNETTE

## 2016-02-08 NOTE — Transfer of Care (Signed)
Immediate Anesthesia Transfer of Care Note  Patient: Teresa Frey  Procedure(s) Performed: Procedure(s): RIGHT BREAST LUMPECTOMY WITH RADIOACTIVE SEED LOCALIZATION (Right)  Patient Location: PACU  Anesthesia Type:General  Level of Consciousness: awake and patient cooperative  Airway & Oxygen Therapy: Patient Spontanous Breathing and Patient connected to face mask oxygen  Post-op Assessment: Report given to RN and Post -op Vital signs reviewed and stable  Post vital signs: Reviewed and stable  Last Vitals:  Vitals:   02/08/16 0801  BP: (!) 147/66  Pulse: 71  Resp: 18  Temp: 36.7 C    Last Pain:  Vitals:   02/08/16 0801  TempSrc: Oral         Complications: No apparent anesthesia complications

## 2016-02-08 NOTE — Op Note (Signed)
Patient Name:           Teresa Frey   Date of Surgery:        02/08/2016  Pre op Diagnosis:      Invasive ductal carcinoma right breast, upper outer quadrant, receptor positive, HER-2 negative, clinical stage TIb, N0.  Post op Diagnosis:    Same   Procedure:                 right breast lumpectomy with radioactive seed localization and margin assessment  Surgeon:                     Edsel Petrin. Dalbert Batman, M.D., FACS  Assistant:                      OR staff  Operative Indications:   . This is a pleasant, reasonably healthy 82 year old Caucasian female, referred by Dr. Emmit Pomfret at [facility] mammography for evaluation and management of a small invasive ductal carcinoma of the right breast, upper outer quadrant. Dr. Burnice Logan is her PCP. She is seen in the [facility] recently  by Dr. Sondra Come, Dr. Burr Medico, and me.       She has no significant prior breast problems. Gets annual mammograms. Recent mammogram shows a 6 mm area of distortion in the right breast at the 9 to 10 o'clock position, middle depth, 3 cm from the nipple. Ultrasound of the right axilla is negative. Image guided biopsy shows grade 1 invasive ductal carcinoma, estrogen receptor 90%. Progesterone receptor 40%. Ki-67 5%, HER-2/neu negative.      Family history reveals that her daughter had breast cancer at age 11 and her daughter is with her today. No other family members with breast or ovarian cancer. Mother and brother had pancreatic cancer. Brother died of colon cancer. A brother had lung disease.      Socially the patient is married but her husband has fairly significant dementia. He lives at home with the patient and the patient and her daughter share duties with care and tube feeds. Patient has 2 children. One daughter lives with her  and a son died of alcohol abuse. The patient is a former smoker having quit in 1989. She drinks alcohol occasionally.      I spent some time talking with her about lumpectomy, mastectomy,  sentinel node biopsy, plastic surgical issues. At the end of the discussion she would like a lumpectomy and I think she is an excellent candidate for that. She is not a candidate for chemotherapy and has a clinically negative axilla so we do not think she needs a lymph node biopsy. She'll be scheduled for right breast lumpectomy  Operative Findings:       The radioactive seed was placed by the radiologist yesterday.  I could hear the radioactive seed with the neoprobe in the holding area The marker clip from the prior biopsy and radioactive seed were in the central right breast, 9:00 position, deep to the lateral areolar margin.  I was able to perform the lumpectomy through a circumareolar incision using the lighted retractors and a hidden scar technique.  The specimen mammogram looked very good with the marker clip and radioactive seed in the dead center of the specimen.  Procedure in Detail:          Following the induction of general LMA anesthesia the patient's right breast was prepped and draped in a sterile fashion.  Surgical timeout was performed and intravenous antibiotics  were given.  0.5% Marcaine with epinephrine was used as a local infiltration anesthetic.     I made a circumareolar incision at the lateral areolar margin.  I dissected down into the breast tissue and dissected widely around the radioactivity using the neoprobe as a guide.  The specimen was removed and marked with silk sutures and a 6 color ink kit to orient the pathologist.  The specimen mammogram looked very good as described above.  The specimen was marked and sent to the lab.  Hemostasis was excellent and achieved electrocautery.  After irrigating the wound I  placed metal clips in the cardinal positions of the lumpectomy cavity.  I then closed the breast tissue with interrupted 3-0 Vicryl and the skin was closed with running subcuticular 4-0 Monocryl and Dermabond.  Dry bandages and a breast binder were placed.  The patient  tolerated the procedure well was taken to PACU in stable condition.  EBL 10-15 mL.  Counts correct.  Complications none.     Edsel Petrin. Dalbert Batman, M.D., FACS General and Minimally Invasive Surgery Breast and Colorectal Surgery  02/08/2016 11:28 AM

## 2016-02-09 ENCOUNTER — Encounter (HOSPITAL_BASED_OUTPATIENT_CLINIC_OR_DEPARTMENT_OTHER): Payer: Self-pay | Admitting: General Surgery

## 2016-02-10 NOTE — Progress Notes (Signed)
Inform patient of Pathology report,.  Tell her that the tumor was 1.1 cm in diameter, completely removed with negative margins.  That is good news that she will not need further surgery.  I will discuss this with her in detail at the next visit.  hmi

## 2016-02-11 ENCOUNTER — Observation Stay (HOSPITAL_COMMUNITY)
Admission: EM | Admit: 2016-02-11 | Discharge: 2016-02-13 | Disposition: A | Discharge status: Home / Self Care | Payer: Medicare Other | Attending: Internal Medicine | Admitting: Internal Medicine

## 2016-02-11 ENCOUNTER — Emergency Department (HOSPITAL_COMMUNITY): Payer: Medicare Other

## 2016-02-11 ENCOUNTER — Encounter (HOSPITAL_COMMUNITY): Payer: Self-pay | Admitting: Emergency Medicine

## 2016-02-11 DIAGNOSIS — C50411 Malignant neoplasm of upper-outer quadrant of right female breast: Secondary | ICD-10-CM | POA: Diagnosis present

## 2016-02-11 DIAGNOSIS — I4891 Unspecified atrial fibrillation: Secondary | ICD-10-CM | POA: Diagnosis not present

## 2016-02-11 DIAGNOSIS — E876 Hypokalemia: Secondary | ICD-10-CM | POA: Insufficient documentation

## 2016-02-11 DIAGNOSIS — I10 Essential (primary) hypertension: Secondary | ICD-10-CM | POA: Diagnosis not present

## 2016-02-11 DIAGNOSIS — G4730 Sleep apnea, unspecified: Secondary | ICD-10-CM | POA: Diagnosis present

## 2016-02-11 DIAGNOSIS — T814XXA Infection following a procedure, initial encounter: Secondary | ICD-10-CM

## 2016-02-11 DIAGNOSIS — L539 Erythematous condition, unspecified: Secondary | ICD-10-CM | POA: Insufficient documentation

## 2016-02-11 DIAGNOSIS — Z79899 Other long term (current) drug therapy: Secondary | ICD-10-CM | POA: Insufficient documentation

## 2016-02-11 DIAGNOSIS — Z96641 Presence of right artificial hip joint: Secondary | ICD-10-CM | POA: Insufficient documentation

## 2016-02-11 DIAGNOSIS — R079 Chest pain, unspecified: Secondary | ICD-10-CM | POA: Diagnosis not present

## 2016-02-11 DIAGNOSIS — R58 Hemorrhage, not elsewhere classified: Secondary | ICD-10-CM | POA: Diagnosis not present

## 2016-02-11 DIAGNOSIS — Z853 Personal history of malignant neoplasm of breast: Secondary | ICD-10-CM | POA: Diagnosis not present

## 2016-02-11 DIAGNOSIS — T8149XA Infection following a procedure, other surgical site, initial encounter: Secondary | ICD-10-CM | POA: Diagnosis present

## 2016-02-11 DIAGNOSIS — J069 Acute upper respiratory infection, unspecified: Secondary | ICD-10-CM | POA: Diagnosis not present

## 2016-02-11 DIAGNOSIS — R0789 Other chest pain: Secondary | ICD-10-CM | POA: Diagnosis not present

## 2016-02-11 DIAGNOSIS — Z87891 Personal history of nicotine dependence: Secondary | ICD-10-CM | POA: Diagnosis not present

## 2016-02-11 DIAGNOSIS — R002 Palpitations: Secondary | ICD-10-CM | POA: Diagnosis not present

## 2016-02-11 LAB — BASIC METABOLIC PANEL
Anion gap: 10 (ref 5–15)
BUN: 13 mg/dL (ref 6–20)
CHLORIDE: 107 mmol/L (ref 101–111)
CO2: 25 mmol/L (ref 22–32)
Calcium: 9.2 mg/dL (ref 8.9–10.3)
Creatinine, Ser: 0.76 mg/dL (ref 0.44–1.00)
Glucose, Bld: 136 mg/dL — ABNORMAL HIGH (ref 65–99)
POTASSIUM: 3.4 mmol/L — AB (ref 3.5–5.1)
Sodium: 142 mmol/L (ref 135–145)

## 2016-02-11 LAB — CBC WITH DIFFERENTIAL/PLATELET
BASOS ABS: 0 10*3/uL (ref 0.0–0.1)
BASOS PCT: 0 %
Eosinophils Absolute: 0.1 10*3/uL (ref 0.0–0.7)
Eosinophils Relative: 1 %
HCT: 45.3 % (ref 36.0–46.0)
HEMOGLOBIN: 14.7 g/dL (ref 12.0–15.0)
LYMPHS ABS: 2.8 10*3/uL (ref 0.7–4.0)
LYMPHS PCT: 24 %
MCH: 30.8 pg (ref 26.0–34.0)
MCHC: 32.5 g/dL (ref 30.0–36.0)
MCV: 95 fL (ref 78.0–100.0)
MONO ABS: 0.9 10*3/uL (ref 0.1–1.0)
Monocytes Relative: 8 %
NEUTROS ABS: 8 10*3/uL — AB (ref 1.7–7.7)
Neutrophils Relative %: 67 %
Platelets: 225 10*3/uL (ref 150–400)
RBC: 4.77 MIL/uL (ref 3.87–5.11)
RDW: 13 % (ref 11.5–15.5)
WBC: 11.8 10*3/uL — ABNORMAL HIGH (ref 4.0–10.5)

## 2016-02-11 LAB — I-STAT TROPONIN, ED: Troponin i, poc: 0.01 ng/mL (ref 0.00–0.08)

## 2016-02-11 MED ORDER — HYDROCODONE-ACETAMINOPHEN 5-325 MG PO TABS: 1.0000 | ORAL_TABLET | ORAL | Status: DC | PRN

## 2016-02-11 MED ORDER — SODIUM CHLORIDE 0.9% FLUSH
3.0000 mL | Freq: Two times a day (BID) | INTRAVENOUS | Status: DC
  Administered 2016-02-11 – 2016-02-12 (×2): 3 mL via INTRAVENOUS

## 2016-02-11 MED ORDER — DEXTROSE 5 % IV SOLN
1.0000 g | INTRAVENOUS | Status: DC
  Administered 2016-02-11: 1 g via INTRAVENOUS
  Filled 2016-02-11 (×2): qty 10

## 2016-02-11 MED ORDER — ADULT MULTIVITAMIN W/MINERALS CH
ORAL_TABLET | Freq: Every day | ORAL | Status: DC
  Administered 2016-02-12 – 2016-02-13 (×2): 1 via ORAL
  Filled 2016-02-11 (×3): qty 1

## 2016-02-11 MED ORDER — DEXTROSE 5 % IV SOLN: 5.0000 mg/h | Freq: Once | INTRAVENOUS | Status: DC

## 2016-02-11 MED ORDER — METOPROLOL TARTRATE 5 MG/5ML IV SOLN
2.5000 mg | Freq: Once | INTRAVENOUS | Status: AC
  Administered 2016-02-11: 2.5 mg via INTRAVENOUS
  Filled 2016-02-11: qty 5

## 2016-02-11 MED ORDER — VANCOMYCIN HCL 10 G IV SOLR
1250.0000 mg | INTRAVENOUS | Status: DC
  Administered 2016-02-12: 1250 mg via INTRAVENOUS
  Filled 2016-02-11 (×3): qty 1250

## 2016-02-11 MED ORDER — CEPHALEXIN 250 MG PO CAPS
500.0000 mg | ORAL_CAPSULE | Freq: Once | ORAL | Status: AC
  Administered 2016-02-11: 500 mg via ORAL
  Filled 2016-02-11: qty 2

## 2016-02-11 MED ORDER — HEPARIN SODIUM (PORCINE) 5000 UNIT/ML IJ SOLN
5000.0000 [IU] | Freq: Three times a day (TID) | INTRAMUSCULAR | Status: DC
  Administered 2016-02-12 – 2016-02-13 (×4): 5000 [IU] via SUBCUTANEOUS
  Filled 2016-02-11 (×5): qty 1

## 2016-02-11 MED ORDER — SERTRALINE HCL 50 MG PO TABS
50.0000 mg | ORAL_TABLET | Freq: Every day | ORAL | Status: DC
  Administered 2016-02-12 – 2016-02-13 (×2): 50 mg via ORAL
  Filled 2016-02-11 (×2): qty 1

## 2016-02-11 MED ORDER — DILTIAZEM HCL 25 MG/5ML IV SOLN: 5.0000 mg | Freq: Once | INTRAVENOUS | Status: DC

## 2016-02-11 NOTE — ED Provider Notes (Signed)
[place] DEPT Provider Note   CSN: [id number] Arrival date & time: 02/11/16  1709     History   Chief Complaint Chief Complaint  Patient presents with  . Atrial Fibrillation    HPI Teresa Frey is a 82 y.o. female.  HPI  [name] is an 82 year old lady status post lumpectomy on Wednesday who presents today with palpitations. 0.230 this afternoon she states that she had a sudden onset of fluttering in her chest. She had some tightness with this. She did not have any dyspnea, lightheadedness, or syncope. She has not had similar symptoms in the past. Otherwise she has been doing well post operatively.  Past Medical History:  Diagnosis Date  . ANXIETY 04/07/2007  . Breast cancer of upper-outer quadrant of right female breast ([place]) 01/11/2016  . COLONIC POLYPS, HX OF 04/02/2007   adenomatous  . DIVERTICULITIS, HX OF 04/02/2007  . Diverticulosis   . DJD (degenerative joint disease)   . Hemorrhoids   . HYPERTENSION 04/02/2007  . OSTEOARTHRITIS, SEVERE 04/07/2007  . 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 WEIGHT GAIN 11/13/2007    Patient Active Problem List   Diagnosis Date Noted  . Breast cancer of upper-outer quadrant of right female breast ([place]) 01/11/2016  . Impaired glucose tolerance 07/28/2014  . WEIGHT GAIN 11/13/2007  . ANXIETY 04/07/2007  . Osteoarthritis 04/07/2007  . Essential hypertension 04/02/2007  . History of colonic polyps 04/02/2007  . DIVERTICULITIS, HX OF 04/02/2007    Past Surgical History:  Procedure Laterality Date  . APPENDECTOMY    . BREAST LUMPECTOMY WITH RADIOACTIVE SEED LOCALIZATION Right 02/08/2016   Procedure: RIGHT BREAST LUMPECTOMY WITH RADIOACTIVE SEED LOCALIZATION;  Surgeon: Fanny Skates, MD;  Location: [facility];  Service: General;  Laterality: Right;  . TONSILLECTOMY    . TOTAL HIP ARTHROPLASTY  1999, left hip    2006 right hip  . TUBAL LIGATION      OB History    No data available       Home  Medications    Prior to Admission medications   Medication Sig Start Date End Date Taking? Authorizing Provider  BIOTIN 5000 PO Take 1 tablet by mouth daily.    Historical Provider, MD  hydrochlorothiazide (HYDRODIURIL) 25 MG tablet Take 1 tablet (25 mg total) by mouth daily. 10/07/15   Marletta Lor, MD  HYDROcodone-acetaminophen (NORCO/VICODIN) 5-325 MG tablet Take 1-2 tablets by mouth every 4 (four) hours as needed. 02/08/16   Fanny Skates, MD  Multiple Vitamins-Minerals (CENTRUM SILVER ADULT 50+) TABS Take 1 tablet by mouth daily.    Historical Provider, MD  phentermine 30 MG capsule Take 1 capsule (30 mg total) by mouth every morning. Patient not taking: Reported on 02/03/2016 10/07/15   Marletta Lor, MD  sertraline (ZOLOFT) 50 MG tablet Take 1 tablet (50 mg total) by mouth daily. 10/07/15   Marletta Lor, MD  vitamin C (ASCORBIC ACID) 500 MG tablet Take 1,000 mg by mouth daily.     Historical Provider, MD    Family History Family History  Problem Relation Age of Onset  . Cancer Mother 51    pancreatic cancer   . Pancreatic cancer Mother 40  . Cancer Father     HD AND LEUKEMIA   . Pancreatic cancer Brother 75  . Lung cancer Brother   . Lung cancer    . Pancreatic cancer    . Colon cancer Brother 53  . Colon cancer  Paternal Aunt   . Heart disease Neg Hx     family  . Esophageal cancer Neg Hx   . Rectal cancer Neg Hx   . Stomach cancer Neg Hx     Social History Social History  Substance Use Topics  . Smoking status: Former Smoker    Packs/day: 1.00    Years: 20.00    Quit date: 06/04/1978  . Smokeless tobacco: Never Used  . Alcohol use 4.2 oz/week    7 Glasses of wine per week     Allergies   Review of patient's allergies indicates no known allergies.   Review of Systems Review of Systems  All other systems reviewed and are negative.    Physical Exam Updated Vital Signs BP 100/68   Pulse 115   Temp 98 F (36.7 C) (Oral)   Resp 11   Ht 5\' 4"   (1.626 m)   Wt 69.4 kg   SpO2 93%   BMI 26.26 kg/m   Physical Exam  Constitutional: She is oriented to person, place, and time. She appears well-developed and well-nourished. No distress.  HENT:  Head: Normocephalic and atraumatic.  Right Ear: External ear normal.  Left Ear: External ear normal.  Nose: Nose normal.  Mouth/Throat: Oropharynx is clear and moist.  Eyes: Conjunctivae and EOM are normal. Pupils are equal, round, and reactive to light.  Neck: Normal range of motion. Neck supple.  Cardiovascular: An irregularly irregular rhythm present.  Pulmonary/Chest: Effort normal and breath sounds normal.  Abdominal: Soft. Bowel sounds are normal.  Genitourinary:  Genitourinary Comments: Right breast with healing incision with sutures in place with well-circumscribed surrounding erythema with some warmth measuring approximately 10 cm in diameter  Musculoskeletal: Normal range of motion.  Neurological: She is alert and oriented to person, place, and time.  Skin: Skin is warm and dry. Capillary refill takes less than 2 seconds.  Psychiatric: She has a normal mood and affect. Her behavior is normal.  Nursing note and vitals reviewed.    ED Treatments / Results  Labs (all labs ordered are listed, but only abnormal results are displayed) Labs Reviewed  CBC WITH DIFFERENTIAL/PLATELET - Abnormal; Notable for the following:       Result Value   WBC 11.8 (*)    Neutro Abs 8.0 (*)    All other components within normal limits  BASIC METABOLIC PANEL - Abnormal; Notable for the following:    Potassium 3.4 (*)    Glucose, Bld 136 (*)    All other components within normal limits  I-STAT TROPOININ, ED    EKG  EKG Interpretation  Date/Time:  Saturday February 11 2016 17:09:46 EDT Ventricular Rate:  127 PR Interval:    QRS Duration: 106 QT Interval:  340 QTC Calculation: 495 R Axis:   -50 Text Interpretation:  Atrial fibrillation LAD, consider left anterior fascicular block  Abnormal R-wave progression, late transition ST depression, probably rate related Borderline prolonged QT interval Confirmed by Corrie Reder MD, Andee Poles [phone]) on 02/11/2016 5:20:19 PM       Radiology Dg Chest Portable 1 View  Result Date: 02/11/2016 CLINICAL DATA:  Palpitations and chest tightness. EXAM: PORTABLE CHEST 1 VIEW COMPARISON:  07/30/2013 FINDINGS: Aortic atherosclerosis. The heart size and mediastinal contours are within normal limits. Both lungs are clear. The visualized skeletal structures are unremarkable. IMPRESSION: No active disease. Electronically Signed   By: Kerby Moors M.D.   On: 02/11/2016 18:21    Procedures Procedures (including critical care time) ULTRASOUND LIMITED SOFT TISSUE/  MUSCULOSKELETAL: right breast soft tissue Indication: right breast swelling post op Linear probe used to evaluate area of interest in two planes. Findings:  No definitive large fluid collection seen Performed by: Dr Jeanell Sparrow Images saved electronically Medications Ordered in ED Medications  diltiazem (CARDIZEM) 100 mg in dextrose 5 % 100 mL (1 mg/mL) infusion (not administered)  diltiazem (CARDIZEM) injection 5 mg (not administered)  cephALEXin (KEFLEX) capsule 500 mg (500 mg Oral Given 02/11/16 1749)  metoprolol (LOPRESSOR) injection 2.5 mg (2.5 mg Intravenous Given 02/11/16 1750)     Initial Impression / Assessment and Plan / ED Course  I have reviewed the triage vital signs and the nursing notes.  Pertinent labs & imaging results that were available during my care of the patient were reviewed by me and considered in my medical decision making (see chart for details).  Clinical Course   1New-onset A. Fib- Patient with conversion to sinus rhythm with Valsalva but goes back into A. fib. Given Lopressor 2.5 IV. Some carotid sinus massage produces sinus rhythm but again she immediately goes back into A. Fib. Cardizem started for rate control. Discussed with Koleen Nimrod on for cardiology and agrees  with admission to hospitalist. Discussed anticoagulation and the patient's postoperative state. Will discuss with admitting physician  8:20 PM At bedside with Dr. Alcario Drought- discussed risk/benefit of anticoagulation Chacvasc-4 Patient in nsr without cardizem 2. Erythema right breast concerning for cellulitis discussed with Dr. Windle Guard, on-call for general surgery, she thinks that this is not cellulitis but will follow patient admitted  Final Clinical Impressions(s) / ED Diagnoses   Final diagnoses:  Atrial fibrillation with RVR [facility])    New Prescriptions New Prescriptions   No medications on file     Pattricia Boss, MD 02/11/16 2023

## 2016-02-11 NOTE — ED Triage Notes (Signed)
Per EMS pt began to feel chest tightness and her heart racing around 1430. EMS reports patient went in to A-fib with a heart rate between 150-160 and then back down to about 60 beats per minute. EMS gave 500 bolus normal saline. Pt states when her heart began to race she developed a headache shortly after. Pt denies headache at this time.

## 2016-02-11 NOTE — Progress Notes (Signed)
Teresa Frey has presented to the ER with Afib RVR. On assessment she was noted to have erythema of the right breast (POD3 lumpectomy). She has not been febrile. She is not experiencing pain.   On exam the right breast is mildly erythematous nearly its entire surface, I measure 20cm transverse and 15cm vertically. There is no induration, tenderness, fluctuance or warmth. The incision is clean and dry, intact with dermabond. There is evolving ecchymosis on the inferior aspect of the breast with some firmness, I suspect a small hematoma had formed and settled there. I outlined the discoloration with a marking pen.   She needs the atrial fibrillation worked up- this is a new problem for her. Given her cancer hx and recent surgery it is not impossible that she could have a Pe. Even if she converts while in the ER I would recommend a scan and labs/ troponin while she is here. I think the breast erythema is not infectious but rather evolving hematoma. Just to be cautious please keep her on keflex whether discharged or admitted. If discharged, she will need to keep an eye and if the erythema is expanding beyond the marked area or she develops pain or fever, she'll need to be reevaluated; if no change she should cal the office Monday to make an appointment with Dr. Dalbert Batman. If admitted, we will continue to follow and perform serial exams while she is here.

## 2016-02-11 NOTE — Progress Notes (Signed)
Pharmacy Antibiotic Note  Teresa Frey is a 82 y.o. female admitted on 02/11/2016 with cellulitis.  Pharmacy has been consulted for vancomycin dosing.  Plan: Vancomycin 1250 IV every 24 hours.  Goal trough 10-15 mcg/mL.  Height: 5\' 4"  (162.6 cm) Weight: 153 lb (69.4 kg) IBW/kg (Calculated) : 54.7  Temp (24hrs), Avg:98 F (36.7 C), Min:98 F (36.7 C), Max:98 F (36.7 C)   Recent Labs Lab 02/11/16 1715  WBC 11.8*  CREATININE 0.76    Estimated Creatinine Clearance: 50.1 mL/min (by C-G formula based on SCr of 0.8 mg/dL).    No Known Allergies  Levester Fresh, PharmD, BCPS, [facility] Clinical Pharmacist Pager [phone] 02/11/2016 8:22 PM

## 2016-02-11 NOTE — ED Notes (Signed)
Pt states she had a lump removed from her right breast. Upon examination the patients breast is red and swollen. Pt stated that it was not like this yesterday. Doctor notified.

## 2016-02-11 NOTE — H&P (Signed)
History and Physical    Teresa Frey ASN:[id number] DOB: [birth year] DOA: 02/11/2016   PCP: Nyoka Cowden, MD Chief Complaint:  Chief Complaint  Patient presents with  . Atrial Fibrillation    HPI: Teresa Frey is a 82 y.o. female with medical history significant of Lumpectomy of stage 1A BRCA R breast performed on 9/6.  Patient presents to ED with c/o palpitations and erythema of R breast.  Symptoms onset around 0230 this afternoon.  Persistent since onset.  Nothing makes better or worse.  ED Course: A.Fib RVR, converted spontaneously in ED.  Patient also with cellulitis of R breast.  Review of Systems: As per HPI otherwise 10 point review of systems negative.    Past Medical History:  Diagnosis Date  . ANXIETY 04/07/2007  . Breast cancer of upper-outer quadrant of right female breast ([place]) 01/11/2016  . COLONIC POLYPS, HX OF 04/02/2007   adenomatous  . DIVERTICULITIS, HX OF 04/02/2007  . Diverticulosis   . DJD (degenerative joint disease)   . Hemorrhoids   . HYPERTENSION 04/02/2007  . OSTEOARTHRITIS, SEVERE 04/07/2007  . 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 WEIGHT GAIN 11/13/2007    Past Surgical History:  Procedure Laterality Date  . APPENDECTOMY    . BREAST LUMPECTOMY WITH RADIOACTIVE SEED LOCALIZATION Right 02/08/2016   Procedure: RIGHT BREAST LUMPECTOMY WITH RADIOACTIVE SEED LOCALIZATION;  Surgeon: Fanny Skates, MD;  Location: [facility];  Service: General;  Laterality: Right;  . TONSILLECTOMY    . TOTAL HIP ARTHROPLASTY  1999, left hip    2006 right hip  . TUBAL LIGATION       reports that she quit smoking about 37 years ago. She has a 20.00 pack-year smoking history. She has never used smokeless tobacco. She reports that she drinks about 4.2 oz of alcohol per week . She reports that she does not use drugs.  No Known Allergies  Family History  Problem Relation Age of Onset  . Cancer Mother 31    pancreatic cancer   .  Pancreatic cancer Mother 66  . Cancer Father     HD AND LEUKEMIA   . Pancreatic cancer Brother 93  . Lung cancer Brother   . Lung cancer    . Pancreatic cancer    . Colon cancer Brother 68  . Colon cancer Paternal Aunt   . Heart disease Neg Hx     family  . Esophageal cancer Neg Hx   . Rectal cancer Neg Hx   . Stomach cancer Neg Hx       Prior to Admission medications   Medication Sig Start Date End Date Taking? Authorizing Provider  Ascorbic Acid (VITAMIN C) 1000 MG tablet Take 1,000 mg by mouth daily.   Yes Historical Provider, MD  BIOTIN 5000 PO Take 1 tablet by mouth daily.   Yes Historical Provider, MD  calcium-vitamin D (OSCAL WITH D) 500-200 MG-UNIT TABS tablet Take 1 tablet by mouth daily.   Yes Historical Provider, MD  cholecalciferol (VITAMIN D) 1000 units tablet Take 1,000 Units by mouth daily.   Yes Historical Provider, MD  hydrochlorothiazide (HYDRODIURIL) 25 MG tablet Take 1 tablet (25 mg total) by mouth daily. 10/07/15  Yes Marletta Lor, MD  HYDROcodone-acetaminophen (NORCO/VICODIN) 5-325 MG tablet Take 1-2 tablets by mouth every 4 (four) hours as needed. Patient taking differently: Take 1-2 tablets by mouth every 4 (four) hours as needed for moderate pain.  02/08/16  Yes Fanny Skates,  MD  Multiple Vitamins-Minerals (CENTRUM SILVER ADULT 50+) TABS Take 1 tablet by mouth daily.   Yes Historical Provider, MD  sertraline (ZOLOFT) 50 MG tablet Take 1 tablet (50 mg total) by mouth daily. 10/07/15  Yes Marletta Lor, MD  vitamin B-12 (CYANOCOBALAMIN) 250 MCG tablet Take 250 mcg by mouth daily.   Yes Historical Provider, MD    Physical Exam: Vitals:   02/11/16 2015 02/11/16 2030 02/11/16 2045 02/11/16 2115  BP: 114/76 121/74 116/68 116/56  Pulse: 68 69 67 70  Resp: 11 18 14 16   Temp:      TempSrc:      SpO2: 92% 95% 94% 91%  Weight:      Height:          Constitutional: NAD, calm, comfortable Eyes: PERRL, lids and conjunctivae normal ENMT: Mucous  membranes are moist. Posterior pharynx clear of any exudate or lesions.Normal dentition.  Neck: normal, supple, no masses, no thyromegaly Respiratory: clear to auscultation bilaterally, no wheezing, no crackles. Normal respiratory effort. No accessory muscle use.  Cardiovascular: Regular rate and rhythm, no murmurs / rubs / gallops. No extremity edema. 2+ pedal pulses. No carotid bruits.  Abdomen: no tenderness, no masses palpated. No hepatosplenomegaly. Bowel sounds positive.  Musculoskeletal: no clubbing / cyanosis. No joint deformity upper and lower extremities. Good ROM, no contractures. Normal muscle tone.  Skin: Erythema and tenderness of R breast, no drainage from incision site, no fluctuance. Neurologic: CN 2-12 grossly intact. Sensation intact, DTR normal. Strength 5/5 in all 4.  Psychiatric: Normal judgment and insight. Alert and oriented x 3. Normal mood.    Labs on Admission: I have personally reviewed following labs and imaging studies  CBC:  Recent Labs Lab 02/11/16 1715  WBC 11.8*  NEUTROABS 8.0*  HGB 14.7  HCT 45.3  MCV 95.0  PLT 599   Basic Metabolic Panel:  Recent Labs Lab 02/11/16 1715  NA 142  K 3.4*  CL 107  CO2 25  GLUCOSE 136*  BUN 13  CREATININE 0.76  CALCIUM 9.2   GFR: Estimated Creatinine Clearance: 50.1 mL/min (by C-G formula based on SCr of 0.8 mg/dL). Liver Function Tests: No results for input(s): AST, ALT, ALKPHOS, BILITOT, PROT, ALBUMIN in the last 168 hours. No results for input(s): LIPASE, AMYLASE in the last 168 hours. No results for input(s): AMMONIA in the last 168 hours. Coagulation Profile: No results for input(s): INR, PROTIME in the last 168 hours. Cardiac Enzymes: No results for input(s): CKTOTAL, CKMB, CKMBINDEX, TROPONINI in the last 168 hours. BNP (last 3 results) No results for input(s): PROBNP in the last 8760 hours. HbA1C: No results for input(s): HGBA1C in the last 72 hours. CBG: No results for input(s): GLUCAP in  the last 168 hours. Lipid Profile: No results for input(s): CHOL, HDL, LDLCALC, TRIG, CHOLHDL, LDLDIRECT in the last 72 hours. Thyroid Function Tests: No results for input(s): TSH, T4TOTAL, FREET4, T3FREE, THYROIDAB in the last 72 hours. Anemia Panel: No results for input(s): VITAMINB12, FOLATE, FERRITIN, TIBC, IRON, RETICCTPCT in the last 72 hours. Urine analysis:    Component Value Date/Time   BILIRUBINUR n 10/06/2015 1034   PROTEINUR 1+ 10/06/2015 1034   UROBILINOGEN 1.0 10/06/2015 1034   NITRITE n 10/06/2015 1034   LEUKOCYTESUR Negative 10/06/2015 1034   Sepsis Labs: @LABRCNTIP (procalcitonin:4,lacticidven:4) )No results found for this or any previous visit (from the past 240 hour(s)).   Radiological Exams on Admission: Dg Chest Portable 1 View  Result Date: 02/11/2016 CLINICAL DATA:  Palpitations and  chest tightness. EXAM: PORTABLE CHEST 1 VIEW COMPARISON:  07/30/2013 FINDINGS: Aortic atherosclerosis. The heart size and mediastinal contours are within normal limits. Both lungs are clear. The visualized skeletal structures are unremarkable. IMPRESSION: No active disease. Electronically Signed   By: Kerby Moors M.D.   On: 02/11/2016 18:21    EKG: Independently reviewed.  Assessment/Plan Principal Problem:   Surgical wound infection Active Problems:   Breast cancer of upper-outer quadrant of right female breast ([place])   Atrial fibrillation with RVR ([place])   New onset atrial fibrillation ([place])    1. Surgical wound infection / cellulitis of R breast - 1. Bedside US performed by EDP reveals no significant hematoma or fluid collection at all to suggest hematoma as primary reason for erythema. 2. Will treat with rocephin and vanc 3. BCx 2. New onset A.Fib - 1. Now back in sinus 2. Tele monitor 3. Will just do DVT ppx heparin for the moment given recent surgery.  CHADS-vasc is 4 however.   DVT prophylaxis: Heparin [place] for now due to recent surgery Code Status: Full Family  Communication: Daughter at bedside Consults called: Surgery saw at bedside Admission status: Admit to obs   Etta Quill DO [facility] Pager [phone] from 7PM-7AM  If 7AM-7PM, please contact the day physician for the patient [email or web address] Password [facility]  02/11/2016, 9:39 PM

## 2016-02-12 ENCOUNTER — Other Ambulatory Visit: Payer: Self-pay | Admitting: Cardiology

## 2016-02-12 DIAGNOSIS — I4891 Unspecified atrial fibrillation: Secondary | ICD-10-CM

## 2016-02-12 DIAGNOSIS — J069 Acute upper respiratory infection, unspecified: Secondary | ICD-10-CM | POA: Diagnosis not present

## 2016-02-12 DIAGNOSIS — I10 Essential (primary) hypertension: Secondary | ICD-10-CM

## 2016-02-12 DIAGNOSIS — R58 Hemorrhage, not elsewhere classified: Secondary | ICD-10-CM | POA: Diagnosis not present

## 2016-02-12 DIAGNOSIS — G4730 Sleep apnea, unspecified: Secondary | ICD-10-CM | POA: Diagnosis present

## 2016-02-12 LAB — BASIC METABOLIC PANEL
ANION GAP: 7 (ref 5–15)
BUN: 12 mg/dL (ref 6–20)
CALCIUM: 8.9 mg/dL (ref 8.9–10.3)
CO2: 27 mmol/L (ref 22–32)
Chloride: 108 mmol/L (ref 101–111)
Creatinine, Ser: 0.62 mg/dL (ref 0.44–1.00)
GLUCOSE: 101 mg/dL — AB (ref 65–99)
POTASSIUM: 3.4 mmol/L — AB (ref 3.5–5.1)
Sodium: 142 mmol/L (ref 135–145)

## 2016-02-12 LAB — CBC
HEMATOCRIT: 43.7 % (ref 36.0–46.0)
Hemoglobin: 14 g/dL (ref 12.0–15.0)
MCH: 30.2 pg (ref 26.0–34.0)
MCHC: 32 g/dL (ref 30.0–36.0)
MCV: 94.4 fL (ref 78.0–100.0)
PLATELETS: 203 10*3/uL (ref 150–400)
RBC: 4.63 MIL/uL (ref 3.87–5.11)
RDW: 13.3 % (ref 11.5–15.5)
WBC: 9.7 10*3/uL (ref 4.0–10.5)

## 2016-02-12 MED ORDER — METOPROLOL TARTRATE 25 MG PO TABS
25.0000 mg | ORAL_TABLET | Freq: Two times a day (BID) | ORAL | Status: DC
  Administered 2016-02-12: 25 mg via ORAL
  Filled 2016-02-12 (×2): qty 1

## 2016-02-12 MED ORDER — METOPROLOL TARTRATE 25 MG PO TABS: 25.0000 mg | ORAL_TABLET | Freq: Two times a day (BID) | ORAL | Status: DC

## 2016-02-12 MED ORDER — POTASSIUM CHLORIDE CRYS ER 20 MEQ PO TBCR
40.0000 meq | EXTENDED_RELEASE_TABLET | Freq: Once | ORAL | Status: AC
  Administered 2016-02-12: 40 meq via ORAL
  Filled 2016-02-12: qty 2

## 2016-02-12 MED ORDER — CEPHALEXIN 500 MG PO CAPS
500.0000 mg | ORAL_CAPSULE | Freq: Three times a day (TID) | ORAL | Status: DC
  Administered 2016-02-12 – 2016-02-13 (×3): 500 mg via ORAL
  Filled 2016-02-12 (×3): qty 1

## 2016-02-12 NOTE — Progress Notes (Signed)
Patient ID: Teresa Frey, female   DOB: [birth year], 82 y.o.   MRN: [id number]  No complaints today Back in NSR  Right breast looks like ecchymosis to me and not cellulitis at all. No warmth. WBC now normal  From a surgical standpoint, I do not think she needs to be on IV antibiotics.  At the most, she could go home with keflex 500mg  tid for 3 days.

## 2016-02-12 NOTE — Consult Note (Signed)
Reason for Consult:   AF with RVR  Requesting Physician: [facility] Primary Cardiologist New  HPI:   Pleasant 82 y/o female with a history of HTN and breast cancer. She had Rt breast lumpectomy as a day surgery pt 02/08/16 which she says went well. Yesterday she had gone about her usual routine. When she came home from grocery store she had sudden tachycardia around 5 pm. She called her daughter who took her to local fire department. From there she was sent ot the ED. In the ED and up until around 7:30 pm she had frequent AF with RVR with occasional sinus beats. She was given IV Diltiazem and IV Lopressor and converted to NSR. She feels well currently. She denies any history of CAD or prior tachycardia. She did admit that she has had a URI, present pre op, she had been taking several OTC medications including Mucinex-D. She also admits she snores at night, "sometimes I even wake myself up".   PMHx:  Past Medical History:  Diagnosis Date  . ANXIETY 04/07/2007  . Breast cancer of upper-outer quadrant of right female breast ([place]) 01/11/2016  . COLONIC POLYPS, HX OF 04/02/2007   adenomatous  . DIVERTICULITIS, HX OF 04/02/2007  . Diverticulosis   . DJD (degenerative joint disease)   . Hemorrhoids   . HYPERTENSION 04/02/2007  . OSTEOARTHRITIS, SEVERE 04/07/2007  . 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 WEIGHT GAIN 11/13/2007    Past Surgical History:  Procedure Laterality Date  . APPENDECTOMY    . BREAST LUMPECTOMY WITH RADIOACTIVE SEED LOCALIZATION Right 02/08/2016   Procedure: RIGHT BREAST LUMPECTOMY WITH RADIOACTIVE SEED LOCALIZATION;  Surgeon: Fanny Skates, MD;  Location: [facility];  Service: General;  Laterality: Right;  . TONSILLECTOMY    . TOTAL HIP ARTHROPLASTY  1999, left hip    2006 right hip  . TUBAL LIGATION      SOCHx:  reports that she quit smoking about 37 years ago. She has a 20.00 pack-year smoking history. She has never used  smokeless tobacco. She reports that she drinks about 4.2 oz of alcohol per week . She reports that she does not use drugs.  FAMHx: Family History  Problem Relation Age of Onset  . Cancer Mother 38    pancreatic cancer   . Pancreatic cancer Mother 39  . Cancer Father     HD AND LEUKEMIA   . Pancreatic cancer Brother 16  . Lung cancer Brother   . Lung cancer    . Pancreatic cancer    . Colon cancer Brother 75  . Colon cancer Paternal Aunt   . Heart disease Neg Hx     family  . Esophageal cancer Neg Hx   . Rectal cancer Neg Hx   . Stomach cancer Neg Hx     ALLERGIES: No Known Allergies  ROS: Review of Systems: General: negative for chills, fever, night sweats or weight changes.  Cardiovascular: negative for chest pain, dyspnea on exertion, edema, orthopnea, palpitations, paroxysmal nocturnal dyspnea or shortness of breath HEENT: negative for any visual disturbances, blindness, glaucoma Dermatological: negative for rash Respiratory: negative for cough, hemoptysis, or wheezing Urologic: negative for hematuria or dysuria Abdominal: negative for nausea, vomiting, diarrhea, bright red blood per rectum, melena, or hematemesis Neurologic: negative for visual changes, syncope, or dizziness Musculoskeletal: negative for back pain, joint pain, or swelling Psych: cooperative and appropriate All other systems reviewed and are  otherwise negative except as noted above.   HOME MEDICATIONS: Prior to Admission medications   Medication Sig Start Date End Date Taking? Authorizing Provider  Ascorbic Acid (VITAMIN C) 1000 MG tablet Take 1,000 mg by mouth daily.   Yes Historical Provider, MD  BIOTIN 5000 PO Take 1 tablet by mouth daily.   Yes Historical Provider, MD  calcium-vitamin D (OSCAL WITH D) 500-200 MG-UNIT TABS tablet Take 1 tablet by mouth daily.   Yes Historical Provider, MD  cholecalciferol (VITAMIN D) 1000 units tablet Take 1,000 Units by mouth daily.   Yes Historical Provider, MD    hydrochlorothiazide (HYDRODIURIL) 25 MG tablet Take 1 tablet (25 mg total) by mouth daily. 10/07/15  Yes Marletta Lor, MD  HYDROcodone-acetaminophen (NORCO/VICODIN) 5-325 MG tablet Take 1-2 tablets by mouth every 4 (four) hours as needed. Patient taking differently: Take 1-2 tablets by mouth every 4 (four) hours as needed for moderate pain.  02/08/16  Yes Fanny Skates, MD  Multiple Vitamins-Minerals (CENTRUM SILVER ADULT 50+) TABS Take 1 tablet by mouth daily.   Yes Historical Provider, MD  sertraline (ZOLOFT) 50 MG tablet Take 1 tablet (50 mg total) by mouth daily. 10/07/15  Yes Marletta Lor, MD  vitamin B-12 (CYANOCOBALAMIN) 250 MCG tablet Take 250 mcg by mouth daily.   Yes Historical Provider, MD    HOSPITAL MEDICATIONS: I have reviewed the patient's current medications.  VITALS: Blood pressure (!) 118/52, pulse (!) 57, temperature 98.2 F (36.8 C), temperature source Oral, resp. rate 16, height 5\' 4"  (1.626 m), weight 153 lb 14.1 oz (69.8 kg), SpO2 96 %.  PHYSICAL EXAM: General appearance: alert, cooperative, no distress and moderately obese Neck: no carotid bruit and no JVD Lungs: clear to auscultation bilaterally and Rt breast ecchymotic Heart: regular rate and rhythm Abdomen: obese, non tender Extremities: extremities normal, atraumatic, no cyanosis or edema Pulses: 2+ and symmetric Skin: Skin color, texture, turgor normal. No rashes or lesions Neurologic: Grossly normal  LABS: Results for orders placed or performed during the hospital encounter of 02/11/16 (from the past 24 hour(s))  CBC with Differential/Platelet     Status: Abnormal   Collection Time: 02/11/16  5:15 PM  Result Value Ref Range   WBC 11.8 (H) 4.0 - 10.5 K/uL   RBC 4.77 3.87 - 5.11 MIL/uL   Hemoglobin 14.7 12.0 - 15.0 g/dL   HCT 45.3 36.0 - 46.0 %   MCV 95.0 78.0 - 100.0 fL   MCH 30.8 26.0 - 34.0 pg   MCHC 32.5 30.0 - 36.0 g/dL   RDW 13.0 11.5 - 15.5 %   Platelets 225 150 - 400 K/uL    Neutrophils Relative % 67 %   Neutro Abs 8.0 (H) 1.7 - 7.7 K/uL   Lymphocytes Relative 24 %   Lymphs Abs 2.8 0.7 - 4.0 K/uL   Monocytes Relative 8 %   Monocytes Absolute 0.9 0.1 - 1.0 K/uL   Eosinophils Relative 1 %   Eosinophils Absolute 0.1 0.0 - 0.7 K/uL   Basophils Relative 0 %   Basophils Absolute 0.0 0.0 - 0.1 K/uL  Basic metabolic panel     Status: Abnormal   Collection Time: 02/11/16  5:15 PM  Result Value Ref Range   Sodium 142 135 - 145 mmol/L   Potassium 3.4 (L) 3.5 - 5.1 mmol/L   Chloride 107 101 - 111 mmol/L   CO2 25 22 - 32 mmol/L   Glucose, Bld 136 (H) 65 - 99 mg/dL   BUN 13  6 - 20 mg/dL   Creatinine, Ser 0.76 0.44 - 1.00 mg/dL   Calcium 9.2 8.9 - 10.3 mg/dL   GFR calc non Af Amer >60 >60 mL/min   GFR calc Af Amer >60 >60 mL/min   Anion gap 10 5 - 15  I-stat troponin, ED     Status: None   Collection Time: 02/11/16  5:45 PM  Result Value Ref Range   Troponin i, poc 0.01 0.00 - 0.08 ng/mL   Comment 3          CBC     Status: None   Collection Time: 02/12/16  4:01 AM  Result Value Ref Range   WBC 9.7 4.0 - 10.5 K/uL   RBC 4.63 3.87 - 5.11 MIL/uL   Hemoglobin 14.0 12.0 - 15.0 g/dL   HCT 43.7 36.0 - 46.0 %   MCV 94.4 78.0 - 100.0 fL   MCH 30.2 26.0 - 34.0 pg   MCHC 32.0 30.0 - 36.0 g/dL   RDW 13.3 11.5 - 15.5 %   Platelets 203 150 - 400 K/uL  Basic metabolic panel     Status: Abnormal   Collection Time: 02/12/16  4:01 AM  Result Value Ref Range   Sodium 142 135 - 145 mmol/L   Potassium 3.4 (L) 3.5 - 5.1 mmol/L   Chloride 108 101 - 111 mmol/L   CO2 27 22 - 32 mmol/L   Glucose, Bld 101 (H) 65 - 99 mg/dL   BUN 12 6 - 20 mg/dL   Creatinine, Ser 0.62 0.44 - 1.00 mg/dL   Calcium 8.9 8.9 - 10.3 mg/dL   GFR calc non Af Amer >60 >60 mL/min   GFR calc Af Amer >60 >60 mL/min   Anion gap 7 5 - 15    EKG:   IMAGING: Dg Chest Portable 1 View  Result Date: 02/11/2016 CLINICAL DATA:  Palpitations and chest tightness. EXAM: PORTABLE CHEST 1 VIEW COMPARISON:   07/30/2013 FINDINGS: Aortic atherosclerosis. The heart size and mediastinal contours are within normal limits. Both lungs are clear. The visualized skeletal structures are unremarkable. IMPRESSION: No active disease. Electronically Signed   By: Kerby Moors M.D.   On: 02/11/2016 18:21    IMPRESSION: Principal Problem:   New onset atrial fibrillation (H[facility]) Active Problems:   Atrial fibrillation with RVR (H[facility])   Essential hypertension   URI (upper respiratory infection)   Breast cancer of upper-outer quadrant of right female breast ([place])   possible sleep apnea by history   Surgical wound infection   RECOMMENDATION: MD to see, add daily beta blocker, echo can be done as an OP, anticoagulation question per Dr Percival Spanish who will see. Check 12 lead in NSR, eventually will need sleep study. Keep K+ >3.8, TSH was WNL in May 2017.   Time Spent Directly with Patient: 40 minutes  Kerin Ransom, [place] beeper 02/12/2016, 1:16 PM   History and all data above reviewed.  Patient examined.  I agree with the findings as above.  She has had one symptomatic episode of atrial fib this admission.  No other cardiac history or recent symptoms.  She is active.  She does work , takes care of her ailing husband, and has a new diagnosis of breast cancer.  The patient exam reveals COR:RRR  ,  Lungs: Clear  ,  Abd: Positive bowel sounds, no rebound no guarding, Ext No edema  .  All available labs, radiology testing, previous records reviewed. Agree with documented assessment and plan.  Atrial fib:  This is the first episode with some extenuating circumstances.  (Recent surgery and stress.)  At this point I had a long discussion with her about her risk.  Ms. BETHSY SCARANTINO has a CHA2DS2 - VASc score of 3 with a risk of stroke of 3.2%.  I will followup with an echo and an event monitor.  Start Eliquis if she has future palpitations or atrial fib on event monitor.  OK to continue low dose beta blocker.  Jeneen Rinks  Melenie Minniear  2:04 PM  02/12/2016

## 2016-02-12 NOTE — Progress Notes (Signed)
PROGRESS NOTE  Teresa Frey  [name] DOB: 04/22/1930  DOA: 02/11/2016 PCP: Nyoka Cowden, MD   Brief Narrative:  82 year old female, married, lives and takes care of her demented husband who is on tube feeds, works in accounts, independent of activities of daily living, invasive ductal carcinoma right breast status post right breast lumpectomy with radioactive seed localization and margin assessment 02/08/16, anxiety, hypertension, presented to [facility] ED 02/11/16 with history of palpitations. She first noted palpitations on 02/11/16 at approximately 2:30 PM, seen by [place] fire department, then transported by EMS to ED where noted to be in A. fib with RVR which reverted to sinus rhythm after dose of IV metoprolol. Some discoloration and pain of right breast. Admitted to telemetry. Cardiology and general surgery consulted.   Assessment & Plan:   Principal Problem:   New onset atrial fibrillation (HCC) Active Problems:   Essential hypertension   URI (upper respiratory infection)   Breast cancer of upper-outer quadrant of right female breast (HCC)   Atrial fibrillation with RVR (HCC)   Surgical wound infection   possible sleep apnea by history   New onset atrial fibrillation - Denies any prior cardiac history or workup. No prior history of palpitations. - Presented to ED with symptomatic A. fib with heart rate in the 140s, reverted to normal sinus rhythm after dose of IV metoprolol. - Cardiology was consulted regarding need for further workup and decision about anticoagulation. - Cardiology input appreciated. Follow 2-D echo. TSH normal in May 2017. Replace potassium. This was her first episode of A. fib under stressful situation-recent surgery. - CHA2DS2 - VASc score of 3. Cardiology has discussed anticoagulation with patient. Plans for event monitor at discharge and to start anticoagulation if she has future palpitations or A. fib on event monitor.  Right breast ecchymosis status  post recent surgery - Agree with surgical follow-up that findings not consistent with cellulitis and more suggestive of ecchymosis. DC IV antibiotics and treat with Keflex 500 MG 3 times a day 3 days.  Essential hypertension - Controlled. Probably can DC HCTZ at discharge.  Hypokalemia  -Replace and follow     DVT prophylaxis: SQ Heparin Code Status: Full Family Communication: Discussed with patient. No family at bedside. Disposition Plan: Dc home possibly 9/11   Consultants:   Cardiology  General Surgery  Procedures:   None  Antimicrobials:   IV Vancomycin > DC'ed  IV Rocephin> DC'ed   PO Keflex.   Subjective: No further palpitations. No chest pain or dyspnea at any time. Mild appropriate postop pain of right breast  Objective:  Vitals:   02/11/16 2146 02/11/16 2148 02/12/16 0539 02/12/16 1321  BP:  130/65 (!) 118/52 (!) 136/54  Pulse:  68 (!) 57 65  Resp:  16  18  Temp:  98 F (36.7 C) 98.2 F (36.8 C) 98.3 F (36.8 C)  TempSrc:  Oral Oral Oral  SpO2:  97% 96% 98%  Weight: 69.8 kg (153 lb 14.1 oz)     Height: 5\' 4"  (1.626 m)       Intake/Output Summary (Last 24 hours) at 02/12/16 1447 Last data filed at 02/12/16 1230  Gross per 24 hour  Intake              720 ml  Output                0 ml  Net              720 ml   Filed  Weights   02/11/16 1720 02/11/16 2146  Weight: 69.4 kg (153 lb) 69.8 kg (153 lb 14.1 oz)    Examination: Patient was examined with her female in in room as chaperone.  General exam: pleasant elderly female sitting up comfortably at edge of bed.  Breasts exam: Ecchymosis over recently operated right breast. No findings suggestive of cellulitis.  Respiratory system: Clear to auscultation. Respiratory effort normal. Cardiovascular system: S1 & S2 heard, RRR. No JVD, murmurs, rubs, gallops or clicks. No pedal edema. telemetry: Sinus rhythm.   Gastrointestinal system: Abdomen is nondistended, soft and nontender. No organomegaly  or masses felt. Normal bowel sounds heard. Central nervous system: Alert and oriented. No focal neurological deficits. Extremities: Symmetric 5 x 5 power. Skin: No rashes, lesions or ulcers Psychiatry: Judgement and insight appear normal. Mood & affect appropriate.     Data Reviewed: I have personally reviewed following labs and imaging studies  CBC:  Recent Labs Lab 02/11/16 1715 02/12/16 0401  WBC 11.8* 9.7  NEUTROABS 8.0*  --   HGB 14.7 14.0  HCT 45.3 43.7  MCV 95.0 94.4  PLT 225 [id number]   Basic Metabolic Panel:  Recent Labs Lab 02/11/16 1715 02/12/16 0401  NA 142 142  K 3.4* 3.4*  CL 107 108  CO2 25 27  GLUCOSE 136* 101*  BUN 13 12  CREATININE 0.76 0.62  CALCIUM 9.2 8.9   GFR: Estimated Creatinine Clearance: 50.2 mL/min (by C-G formula based on SCr of 0.8 mg/dL). Liver Function Tests: No results for input(s): AST, ALT, ALKPHOS, BILITOT, PROT, ALBUMIN in the last 168 hours. No results for input(s): LIPASE, AMYLASE in the last 168 hours. No results for input(s): AMMONIA in the last 168 hours. Coagulation Profile: No results for input(s): INR, PROTIME in the last 168 hours. Cardiac Enzymes: No results for input(s): CKTOTAL, CKMB, CKMBINDEX, TROPONINI in the last 168 hours. BNP (last 3 results) No results for input(s): PROBNP in the last 8760 hours. HbA1C: No results for input(s): HGBA1C in the last 72 hours. CBG: No results for input(s): GLUCAP in the last 168 hours. Lipid Profile: No results for input(s): CHOL, HDL, LDLCALC, TRIG, CHOLHDL, LDLDIRECT in the last 72 hours. Thyroid Function Tests: No results for input(s): TSH, T4TOTAL, FREET4, T3FREE, THYROIDAB in the last 72 hours. Anemia Panel: No results for input(s): VITAMINB12, FOLATE, FERRITIN, TIBC, IRON, RETICCTPCT in the last 72 hours.  Sepsis Labs: No results for input(s): PROCALCITON, LATICACIDVEN in the last 168 hours.  No results found for this or any previous visit (from the past 240 hour(s)).         Radiology Studies: Dg Chest Portable 1 View  Result Date: 02/11/2016 CLINICAL DATA:  Palpitations and chest tightness. EXAM: PORTABLE CHEST 1 VIEW COMPARISON:  07/30/2013 FINDINGS: Aortic atherosclerosis. The heart size and mediastinal contours are within normal limits. Both lungs are clear. The visualized skeletal structures are unremarkable. IMPRESSION: No active disease. Electronically Signed   By: Kerby Moors M.D.   On: 02/11/2016 18:21        Scheduled Meds: . cephALEXin  500 mg Oral Q8H  . heparin  5,000 Units Subcutaneous Q8H  . metoprolol tartrate  25 mg Oral BID  . multivitamin with minerals   Oral Daily  . potassium chloride  40 mEq Oral Once  . sertraline  50 mg Oral Daily  . sodium chloride flush  3 mL Intravenous Q12H   Continuous Infusions:    LOS: 0 days    Time spent: 30 minutes.  [facility], MD [facility] Pager [phone]  If 7PM-7AM, please contact night-coverage [email or web address] Password [email or web address] 02/12/2016, 2:47 PM

## 2016-02-13 ENCOUNTER — Observation Stay (HOSPITAL_BASED_OUTPATIENT_CLINIC_OR_DEPARTMENT_OTHER): Payer: Medicare Other

## 2016-02-13 ENCOUNTER — Other Ambulatory Visit: Payer: Self-pay | Admitting: Cardiology

## 2016-02-13 DIAGNOSIS — I10 Essential (primary) hypertension: Secondary | ICD-10-CM | POA: Diagnosis not present

## 2016-02-13 DIAGNOSIS — I4891 Unspecified atrial fibrillation: Secondary | ICD-10-CM | POA: Diagnosis not present

## 2016-02-13 LAB — ECHOCARDIOGRAM COMPLETE
Height: 64 in
WEIGHTICAEL: 2462.1 [oz_av]

## 2016-02-13 LAB — BASIC METABOLIC PANEL
Anion gap: 7 (ref 5–15)
BUN: 16 mg/dL (ref 6–20)
CHLORIDE: 108 mmol/L (ref 101–111)
CO2: 26 mmol/L (ref 22–32)
CREATININE: 0.69 mg/dL (ref 0.44–1.00)
Calcium: 9.1 mg/dL (ref 8.9–10.3)
GFR calc non Af Amer: >60 mL/min (ref >60)
Glucose, Bld: 114 mg/dL — ABNORMAL HIGH (ref 65–99)
Potassium: 4.2 mmol/L (ref 3.5–5.1)
SODIUM: 141 mmol/L (ref 135–145)

## 2016-02-13 MED ORDER — METOPROLOL TARTRATE 25 MG PO TABS: 25.0000 mg | ORAL_TABLET | Freq: Two times a day (BID) | ORAL | 0 refills | Status: DC

## 2016-02-13 MED ORDER — ASPIRIN 81 MG PO TBEC: 81.0000 mg | DELAYED_RELEASE_TABLET | Freq: Every day | ORAL | 0 refills | Status: DC

## 2016-02-13 MED ORDER — ASPIRIN EC 81 MG PO TBEC: 81.0000 mg | DELAYED_RELEASE_TABLET | Freq: Every day | ORAL | Status: DC

## 2016-02-13 NOTE — Discharge Instructions (Signed)

## 2016-02-13 NOTE — Progress Notes (Signed)
Pt/family given discharge instructions, medication lists, follow up appointments, and when to call the doctor.  Pt/family verbalizes understanding. Ryot Burrous McClintock, RN   

## 2016-02-13 NOTE — Discharge Summary (Signed)
Physician Discharge Summary  Teresa Frey [id number] DOB: 03-Jun-1930  PCP: Nyoka Cowden, MD  Admit date: 02/11/2016 Discharge date: 02/13/2016  Admitted From: Home Disposition:  Home  Recommendations for Outpatient Follow-up:  1. Dr. [name], Cardiology: MDs office will arrange outpatient heart monitor and follow-up. 2. Dr. Fanny Skates, Surgery: Patient has prior appointment to be seen in 2 weeks. 3. Dr. Bluford Kaufmann, PCP in 2 weeks.  Home Health: None Equipment/Devices: None    Discharge Condition: Improved and stable  CODE STATUS: Full  Diet recommendation: Heart healthy diet  Discharge Diagnoses:  Principal Problem:   New onset atrial fibrillation (HCC) Active Problems:   Essential hypertension   URI (upper respiratory infection)   Breast cancer of upper-outer quadrant of right female breast (HCC)   Atrial fibrillation with RVR (HCC)   Surgical wound infection   possible sleep apnea by history   Brief/Interim Summary: 82 year old female, married, lives and takes care of her demented husband who is on tube feeds, works in accounts, independent of activities of daily living, invasive ductal carcinoma right breast status post right breast lumpectomy with radioactive seed localization and margin assessment 02/08/16, anxiety, hypertension, presented to [facility] ED 02/11/16 with history of palpitations. She first noted palpitations on 02/11/16 at approximately 2:30 PM, seen by [place] fire department, then transported by EMS to ED where noted to be in A. fib with RVR which reverted to sinus rhythm after dose of IV metoprolol. Some discoloration and pain of right breast. Admitted to telemetry. Cardiology and general surgery consulted.   Assessment & Plan:   New onset atrial fibrillation - Denies any prior cardiac history or workup. No prior history of palpitations. - Presented to ED with symptomatic A. fib with heart rate in the 140s, reverted to normal sinus  rhythm after dose of IV metoprolol. - Cardiology was consulted regarding need for further workup and decision about anticoagulation. - Cardiology input appreciated. Follow 2-D echo. TSH normal in May 2017. Replaced potassium. This was her first episode of A. fib under stressful situation-recent surgery. - CHA2DS2 - VASc score of 3. Cardiology has discussed anticoagulation with patient. Plans for event monitor at discharge and to start anticoagulation if she has future palpitations or A. fib on event monitor. - 2-D echo results as below and unremarkable. Discussed with cardiology and cleared for discharge home on metoprolol and aspirin 81 MG daily.  Right breast ecchymosis status post recent surgery for breast cancer - Patient's primary surgeon has seen patient today and reiterate that she does not have features of infection. Empirically started antibiotics will be discontinued at discharge. She has appropriate postop ecchymosis which is not worsening.  Essential hypertension - Controlled. Now that patient has been started on metoprolol for A. fib, DC HCTZ at discharge which is likely to cause electrolyte problems i.e. hyponatremia, hypokalemia in this elderly female patient.  Hypokalemia  -Replaced    Consultants:   Cardiology  General Surgery  Procedures:   None   Discharge Instructions  Discharge Instructions    Activity as tolerated - No restrictions    Complete by:  As directed   Call MD for:    Complete by:  As directed   Palpitations/heart racing.   Diet - low sodium heart healthy    Complete by:  As directed       Medication List    STOP taking these medications   hydrochlorothiazide 25 MG tablet Commonly known as:  HYDRODIURIL     TAKE these medications  aspirin 81 MG EC tablet Take 1 tablet (81 mg total) by mouth daily.   BIOTIN 5000 PO Take 1 tablet by mouth daily.   calcium-vitamin D 500-200 MG-UNIT Tabs tablet Commonly known as:  OSCAL WITH  D Take 1 tablet by mouth daily.   CENTRUM SILVER ADULT 50+ Tabs Take 1 tablet by mouth daily.   cholecalciferol 1000 units tablet Commonly known as:  VITAMIN D Take 1,000 Units by mouth daily.   HYDROcodone-acetaminophen 5-325 MG tablet Commonly known as:  NORCO/VICODIN Take 1-2 tablets by mouth every 4 (four) hours as needed. What changed:  reasons to take this   metoprolol tartrate 25 MG tablet Commonly known as:  LOPRESSOR Take 1 tablet (25 mg total) by mouth 2 (two) times daily.   sertraline 50 MG tablet Commonly known as:  ZOLOFT Take 1 tablet (50 mg total) by mouth daily.   vitamin B-12 250 MCG tablet Commonly known as:  CYANOCOBALAMIN Take 250 mcg by mouth daily.   vitamin C 1000 MG tablet Take 1,000 mg by mouth daily.      Follow-up Information    [name], MD .   Specialty:  Cardiology Why:  MD's office will call you to arrange for outpatient heart monitor and follow-up. Contact information: [address] [place] [address] [phone]        Adin Hector, MD .   Specialty:  General Surgery Why:  Please keep the prior appointment you have. Contact information: [place]  [place] [address] [phone]        Nyoka Cowden, MD. Schedule an appointment as soon as possible for a visit in 2 week(s).   Specialty:  Internal Medicine Contact information: [place] [address] [phone]          No Known Allergies  Procedures/Studies: Dg Chest Portable 1 View  Result Date: 02/11/2016 CLINICAL DATA:  Palpitations and chest tightness. EXAM: PORTABLE CHEST 1 VIEW COMPARISON:  07/30/2013 FINDINGS: Aortic atherosclerosis. The heart size and mediastinal contours are within normal limits. Both lungs are clear. The visualized skeletal structures are unremarkable. IMPRESSION: No active disease. Electronically Signed   By: Kerby Moors M.D.   On: 02/11/2016 18:21  2-D echo  02/13/16: Study Conclusions  - Left ventricle: The cavity size was normal. Wall thickness was   normal. Systolic function was normal. The estimated ejection   fraction was in the range of 55% to 60%. Wall motion was normal;   there were no regional wall motion abnormalities. Doppler   parameters are consistent with abnormal left ventricular   relaxation (grade 1 diastolic dysfunction). - Mitral valve: Calcified annulus.    Subjective: Denies complaints. No palpitations. No pain in right breast. Patient interviewed and examined in the presence of her female RN in room.  Discharge Exam:  Vitals:   02/12/16 1530 02/12/16 2100 02/13/16 0612 02/13/16 1047  BP:  (!) 143/61 (!) 128/50   Pulse: (!) 58 65 (!) 53 (!) 59  Resp:  16 18   Temp:  98.4 F (36.9 C) 97.8 F (36.6 C)   TempSrc:  Oral Oral   SpO2:  97% 96%   Weight:      Height:        General exam: pleasant elderly female sitting up comfortably in chair this morning. Breasts exam: Ecchymosis over recently operated right breast- no worsening, mild and improving. No findings suggestive of cellulitis.  Respiratory system: Clear to auscultation. Respiratory effort normal.  Cardiovascular system: S1 & S2 heard, RRR. No JVD, murmurs, rubs, gallops or clicks. No pedal edema. Telemetry: Sinus rhythm. Sinus bradycardia in the mid 50s overnight.   Gastrointestinal system: Abdomen is nondistended, soft and nontender. No organomegaly or masses felt. Normal bowel sounds heard. Central nervous system: Alert and oriented. No focal neurological deficits. Extremities: Symmetric 5 x 5 power. Skin: No rashes, lesions or ulcers Psychiatry: Judgement and insight appear normal. Mood & affect appropriate.     The results of significant diagnostics from this hospitalization (including imaging, microbiology, ancillary and laboratory) are listed below for reference.     Microbiology: Recent Results (from the past 240 hour(s))  Culture, blood  (routine x 2)     Status: None (Preliminary result)   Collection Time: 02/11/16 10:05 PM  Result Value Ref Range Status   Specimen Description BLOOD LEFT HAND  Final   Special Requests BOTTLES DRAWN AEROBIC AND ANAEROBIC 5CC   Final   Culture NO GROWTH < 24 HOURS  Final   Report Status PENDING  Incomplete  Culture, blood (routine x 2)     Status: None (Preliminary result)   Collection Time: 02/11/16 10:14 PM  Result Value Ref Range Status   Specimen Description BLOOD LEFT ARM  Final   Special Requests BOTTLES DRAWN AEROBIC AND ANAEROBIC 5CC   Final   Culture NO GROWTH < 24 HOURS  Final   Report Status PENDING  Incomplete     Labs: BNP (last 3 results) No results for input(s): BNP in the last 8760 hours. Basic Metabolic Panel:  Recent Labs Lab 02/11/16 1715 02/12/16 0401 02/13/16 0300  NA 142 142 141  K 3.4* 3.4* 4.2  CL 107 108 108  CO2 25 27 26   GLUCOSE 136* 101* 114*  BUN 13 12 16   CREATININE 0.76 0.62 0.69  CALCIUM 9.2 8.9 9.1   Liver Function Tests: No results for input(s): AST, ALT, ALKPHOS, BILITOT, PROT, ALBUMIN in the last 168 hours. No results for input(s): LIPASE, AMYLASE in the last 168 hours. No results for input(s): AMMONIA in the last 168 hours. CBC:  Recent Labs Lab 02/11/16 1715 02/12/16 0401  WBC 11.8* 9.7  NEUTROABS 8.0*  --   HGB 14.7 14.0  HCT 45.3 43.7  MCV 95.0 94.4  PLT 225 203    Time coordinating discharge: Over 30 minutes  SIGNED:  Vernell Leep, MD, FACP, FHM. [facility] Pager [phone]  If 7PM-7AM, please contact night-coverage [email or web address] Password [facility] 02/13/2016, 12:46 PM

## 2016-02-13 NOTE — Progress Notes (Signed)
Order for OP echo, monitor, and f/u OV arranged- office will contact her.  Kerin Ransom PA-C 02/13/2016 8:22 AM

## 2016-02-13 NOTE — Progress Notes (Signed)
  Echocardiogram 2D Echocardiogram has been performed.  Diamond Nickel 02/13/2016, 8:54 AM

## 2016-02-13 NOTE — Progress Notes (Signed)
General surgery:  Her right breast lumpectomy incision looks fine. Typical amount of ecchymoses. Breast tissue is very soft.  Minimal tenderness.  No significant hematoma This wound is not infected  She may be discharged from a surgery standpoint Wound care discussed with patient She already has an appointment to see me in approximately 2 weeks We will sign off   Gerianne Simonet M. Dalbert Batman, M.D., [facility] General and Minimally invasive Surgery Breast and Colorectal Surgery Office:   [phone] Pager:   [phone]

## 2016-02-16 LAB — CULTURE, BLOOD (ROUTINE X 2)
CULTURE: NO GROWTH
CULTURE: NO GROWTH

## 2016-02-20 ENCOUNTER — Ambulatory Visit (INDEPENDENT_AMBULATORY_CARE_PROVIDER_SITE_OTHER): Payer: Medicare Other

## 2016-02-20 ENCOUNTER — Encounter: Payer: Self-pay | Admitting: Genetic Counselor

## 2016-02-20 DIAGNOSIS — I4891 Unspecified atrial fibrillation: Secondary | ICD-10-CM

## 2016-02-20 NOTE — Progress Notes (Signed)
Location of Breast Cancer: Breast cancer of upper-outer quadrant of right female breast   Histology per Pathology Report:   02/08/16 Diagnosis Breast, lumpectomy, Right - INVASIVE AND IN SITU DUCTAL CARCINOMA, 1.1 CM. - MARGINS NOT INVOLVED. - FIBROCYSTIC CHANGES WITH CALCIFICATIONS. - LOBULAR NEOPLASIA (ATYPICAL LOBULAR HYPERPLASIA). - PREVIOUS BIOPSY SITE.  01/09/16 Diagnosis Breast, right, needle core biopsy, 9 o'clock 3 cm fn INVASIVE DUCTAL CARCINOMA, GRADE 1 TO 2  Receptor Status: ER(90%), PR (40%), Her2-neu (negative), Ki-(5%)  Did patient present with symptoms (if so, please note symptoms) or was this found on screening mammography?: screening mammogram  Past/Anticipated interventions by surgeon, if any: 02/08/16 - Procedure: RIGHT BREAST LUMPECTOMY WITH RADIOACTIVE SEED LOCALIZATION;  Surgeon: Fanny Skates, MD  Past/Anticipated interventions by medical oncology, if any: adjuvant aromatase inhibitor   Lymphedema issues, if any:  no   Pain issues, if any:  no   GYN HISTORY: Menarchal:13, LMP: late 20's, Contraceptive: a few years, HRT: no, G2P2: her son died in 78's, she has a daughter   SAFETY ISSUES:  Prior radiation? no  Pacemaker/ICD? no  Possible current pregnancy?no  Is the patient on methotrexate? no  Current Complaints / other details:  Patient reports she feel the night before surgey and reports her tailbone is still sore.  She also went in to A Fib after surgery and is now wearing a heart monitor for a month.  BP (!) 140/43 (BP Location: Left Arm, Patient Position: Sitting)   Pulse (!) 59   Temp 98.1 F (36.7 C) (Oral)   Ht 5' 4"  (1.626 m)   Wt 158 lb 11.2 oz (72 kg)   SpO2 100%   BMI 27.24 kg/m    Wt Readings from Last 3 Encounters:  02/23/16 158 lb 11.2 oz (72 kg)  02/11/16 153 lb 14.1 oz (69.8 kg)  02/08/16 153 lb (69.4 kg)      Jacqulyn Liner, RN 02/20/2016,9:51 AM

## 2016-02-21 ENCOUNTER — Ambulatory Visit (HOSPITAL_BASED_OUTPATIENT_CLINIC_OR_DEPARTMENT_OTHER): Payer: Medicare Other | Admitting: Genetic Counselor

## 2016-02-21 ENCOUNTER — Encounter: Payer: Self-pay | Admitting: Genetic Counselor

## 2016-02-21 ENCOUNTER — Other Ambulatory Visit: Payer: Medicare Other

## 2016-02-21 DIAGNOSIS — Z800 Family history of malignant neoplasm of digestive organs: Secondary | ICD-10-CM

## 2016-02-21 DIAGNOSIS — Z840 Family history of diseases of the skin and subcutaneous tissue: Secondary | ICD-10-CM

## 2016-02-21 DIAGNOSIS — Z803 Family history of malignant neoplasm of breast: Secondary | ICD-10-CM

## 2016-02-21 DIAGNOSIS — C50411 Malignant neoplasm of upper-outer quadrant of right female breast: Secondary | ICD-10-CM | POA: Diagnosis not present

## 2016-02-21 DIAGNOSIS — Z808 Family history of malignant neoplasm of other organs or systems: Secondary | ICD-10-CM

## 2016-02-21 DIAGNOSIS — Z807 Family history of other malignant neoplasms of lymphoid, hematopoietic and related tissues: Secondary | ICD-10-CM

## 2016-02-21 DIAGNOSIS — Z806 Family history of leukemia: Secondary | ICD-10-CM

## 2016-02-21 DIAGNOSIS — Z801 Family history of malignant neoplasm of trachea, bronchus and lung: Secondary | ICD-10-CM

## 2016-02-21 DIAGNOSIS — Z315 Encounter for genetic counseling: Secondary | ICD-10-CM

## 2016-02-21 NOTE — Progress Notes (Signed)
REFERRING PROVIDER: Truitt Merle, MD  PRIMARY PROVIDER:  Nyoka Cowden, MD  PRIMARY REASON FOR VISIT:  1. Breast cancer of upper-outer quadrant of right female breast ([place])   2. Family history of breast cancer in first degree relative   3. Family history of pancreatic cancer   4. Family history of colon cancer   5. Family history of lung cancer   6. Family history of nonmelanoma skin cancer   7. Family history of Hodgkin's disease   8. Family history of leukemia      HISTORY OF PRESENT ILLNESS:   [name], a 82 y.o. female, was seen for a [place] cancer genetics consultation at the request of Dr. Burr Medico due to a personal history of breast cancer and family history of breast, pancreatic, colon, and other cancers.  [name] presents to clinic today to discuss the possibility of a hereditary predisposition to cancer, genetic testing, and to further clarify her future cancer risks, as well as potential cancer risks for family members.   In August 2017, at the age of 7, [name] was diagnosed with invasive ductal carcinoma of the righ breast.  Hormone receptor status was ER/PR+, Her2-. This was treated with right lumpectomy on September 6th.  [name] reports no additional personal history of cancer.   CANCER HISTORY:  Oncology History   Breast cancer of upper-outer quadrant of right female breast [facility])   Staging form: Breast, AJCC 7th Edition   - Clinical stage from 01/18/2016: Stage IA (T1b, N0, M0) - Unsigned      Breast cancer of upper-outer quadrant of right female breast ([place])   01/05/2016 Mammogram    Diagnostic mammogram and ultrasound showed a 9 mm mass in the upper outer quadrant of right breast, axillary nodes were negative.      01/09/2016 Initial Diagnosis    Breast cancer of upper-outer quadrant of right female breast ([place])      01/09/2016 Initial Biopsy    R breast 9:00 position biopsy showed invasive ductal carcinoma, G1-2      01/09/2016 Receptors her2    ER 90%+, PR 40%+, HER2-, Ki67 5%        HORMONAL RISK FACTORS:  Menarche was at age 34.  First live birth at age - not assessed.  OCP use for approximately 1-2 years.  Ovaries intact: yes.  Hysterectomy: no.  Menopausal status: postmenopausal.  HRT use: premarin for 3-4 years. Colonoscopy: yes; history of 5+ adenomatous polyps and 1 hyperplastic polyp--reports less than 10 total polyps. Mammogram within the last year: yes. Number of breast biopsies: 1. Up to date with pelvic exams:  n/a. Any excessive radiation exposure/other exposures in the past:  no  Past Medical History:  Diagnosis Date  . ANXIETY 04/07/2007  . Breast cancer of upper-outer quadrant of right female breast ([place]) 01/11/2016  . COLONIC POLYPS, HX OF 04/02/2007   adenomatous  . DIVERTICULITIS, HX OF 04/02/2007  . Diverticulosis   . DJD (degenerative joint disease)   . Hemorrhoids   . HYPERTENSION 04/02/2007  . OSTEOARTHRITIS, SEVERE 04/07/2007  . 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 WEIGHT GAIN 11/13/2007    Past Surgical History:  Procedure Laterality Date  . APPENDECTOMY    . BREAST LUMPECTOMY WITH RADIOACTIVE SEED LOCALIZATION Right 02/08/2016   Procedure: RIGHT BREAST LUMPECTOMY WITH RADIOACTIVE SEED LOCALIZATION;  Surgeon: Fanny Skates, MD;  Location: [place];  Service: General;  Laterality: Right;  . TONSILLECTOMY    .  TOTAL HIP ARTHROPLASTY  1999, left hip    2006 right hip  . TUBAL LIGATION      Social History   Social History  . Marital status: Married    Spouse name: N/A  . Number of children: N/A  . Years of education: N/A   Social History Main Topics  . Smoking status: Former Smoker    Packs/day: 1.00    Years: 27.00    Types: Cigarettes    Quit date: 06/04/1978  . Smokeless tobacco: Never Used  . Alcohol use 4.2 oz/week    7 Glasses of wine per week     Comment: 1-2 glasses wine after dinner, but not necessarily each day  . Drug use: No  . Sexual  activity: Not Asked   Other Topics Concern  . None   Social History Narrative  . None     FAMILY HISTORY:  We obtained a detailed, 4-generation family history.  Significant diagnoses are listed below: Family History  Problem Relation Age of Onset  . Pancreatic cancer Mother 72    d. 67  . Cancer Father 76    hodgkin's lymphoma and leukemia; d. 23  . Colon cancer Brother     dx. late 83s; smoker  . Pancreatic cancer Brother 80    d. 8; smoker  . Lung cancer Brother 40    smoker  . Heart attack Brother     d. late 67s  . Colon cancer Paternal Aunt   . Breast cancer Daughter 79    negative genetic testing in 2016  . Skin cancer Daughter     non-melanoma type; +sun exposure  . Kidney failure Son 60    +EtOH abuse resulting in liver, kidney, and pancreas failure  . Diabetes Maternal Uncle     d. later age  . Heart disease Neg Hx     family  . Esophageal cancer Neg Hx   . Rectal cancer Neg Hx   . Stomach cancer Neg Hx     [name] has one daughter and one son.  Her son passed away from liver, pancreas, and kidney failure in the setting of alcohol abuse, at the age of 45.  [name] daughter is 73 and was diagnosed with breast cancer last year, at which time she reportedly had negative genetic testing.  [name] daughter also has a history of non-melanoma skin cancer, which [name] attributes to sun exposure.  [name] grandchildren are cancer-free.  [name] had three full brothers, all of whom have passed away.  Her oldest brother was diagnosed with and passed away from colon cancer in his late 107s; she reports that he was not one to get colonoscopies and that he was a smoker.  The middle brother was a smoker and was diagnosed with lung cancer at 82, however, he passed away from a heart attack in his late 38s.  The youngest brother was a smoker and he was diagnosed with pancreatic cancer at 80--he passed away a year later.  [name] only had one niece, and she passed  away from an unspecified cause at the age of 101.  She has limited information for her nephews.  [name] mother was diagnosed with pancreatic cancer at 66 and passed away at 21.  She had five full brothers, for whom Ms. Hackmann has limited information.  She does report that these uncles all passed away at later ages in life.  She also has no information for her  maternal first cousins or for her maternal grandparents whom passed away before she was born.  Ms. Moyd's father had at least one brother whom Ms. Matos remembers.  She has no further information for this uncle, for any cousins, or for her paternal grandparents who passed away before she was born.  Patient's maternal and paternal ancestors are of Caucasian descent. There is no reported Ashkenazi Jewish ancestry. There is no known consanguinity.  GENETIC COUNSELING ASSESSMENT: MIGDALIA OLEJNICZAK is a 82 y.o. female with a personal and family history of cancer which is somewhat suggestive of a hereditary cancer syndrome and predisposition to cancer. We, therefore, discussed and recommended the following at today's visit.   DISCUSSION: We reviewed the characteristics, features and inheritance patterns of hereditary cancer syndromes, particularly those caused by mutations within the BRCA1/2, PALB2, and Lynch syndrome genes. We also discussed genetic testing, including the appropriate family members to test, the process of testing, insurance coverage and turn-around-time for results. We discussed the implications of a negative, positive and/or variant of uncertain significant result. We recommended Ms. Caul pursue genetic testing for the 32-gene Comprehensive Cancer Panel with MSH2 Exons 1-7 Inversion Analysis. The Comprehensive Cancer Panel offered by [facility] Junius Roads, MD) includes sequencing and/or deletion duplication testing of the following 32 genes: APC, ATM, AXIN2, BARD1, BMPR1A, BRCA1, BRCA2, BRIP1, CDH1, CDK4, CDKN2A, CHEK2, EPCAM,  FANCC, MLH1, MSH2, MSH6, MUTYH, NBN, PALB2, PMS2, POLD1, POLE, PTEN, RAD51C, RAD51D, SCG5/GREM1, SMAD4, STK11, TP53, VHL, and XRCC2.    Based on Ms. Koval's personal and family history of cancer, she meets medical criteria for genetic testing. Despite that she meets criteria, she may still have an out of pocket cost. We discussed that if her out of pocket cost for testing is over $100, the laboratory will call and confirm whether she wants to proceed with testing.  If the out of pocket cost of testing is less than $100 she will be billed by the genetic testing laboratory.   PLAN: After considering the risks, benefits, and limitations, Ms. Sindt  provided informed consent to pursue genetic testing and the blood sample was sent to [facility] for analysis of the 32-gene Comprehensive Cancer Panel with MSH2 Exons 1-7 Inversion Analysis. Results should be available within approximately 2-3 weeks' time, at which point they will be disclosed by telephone to Ms. Flahive, as will any additional recommendations warranted by these results. Ms. Stierwalt will receive a summary of her genetic counseling visit and a copy of her results once available. This information will also be available in Epic. We encouraged Ms. Klosowski to remain in contact with cancer genetics annually so that we can continuously update the family history and inform her of any changes in cancer genetics and testing that may be of benefit for her family. Ms. Haseman questions were answered to her satisfaction today. Our contact information was provided should additional questions or concerns arise.  Thank you for the referral and allowing [place] to share in the care of your patient.   Jeanine Luz, MS, [facility] Certified Genetic Counselor [email or web address] Phone: [phone]  The patient was seen for a total of 60 minutes in face-to-face genetic counseling.  This patient was discussed with Drs. Magrinat, Lindi Adie and/or Burr Medico who agrees with the  above.    _______________________________________________________________________ For Office Staff:  Number of people involved in session: 1 Was an Intern/ student involved with case: no

## 2016-02-23 ENCOUNTER — Encounter: Payer: Self-pay | Admitting: Radiation Oncology

## 2016-02-23 ENCOUNTER — Ambulatory Visit
Admission: RE | Admit: 2016-02-23 | Discharge: 2016-02-23 | Disposition: A | Discharge status: Home / Self Care | Payer: Medicare Other | Source: Ambulatory Visit | Attending: Radiation Oncology | Admitting: Radiation Oncology

## 2016-02-23 DIAGNOSIS — Z7982 Long term (current) use of aspirin: Secondary | ICD-10-CM | POA: Diagnosis not present

## 2016-02-23 DIAGNOSIS — I4891 Unspecified atrial fibrillation: Secondary | ICD-10-CM | POA: Insufficient documentation

## 2016-02-23 DIAGNOSIS — C50411 Malignant neoplasm of upper-outer quadrant of right female breast: Secondary | ICD-10-CM | POA: Insufficient documentation

## 2016-02-23 DIAGNOSIS — Z79899 Other long term (current) drug therapy: Secondary | ICD-10-CM | POA: Insufficient documentation

## 2016-02-23 DIAGNOSIS — Z170 Estrogen receptor positive status [ER+]: Secondary | ICD-10-CM | POA: Diagnosis not present

## 2016-02-23 DIAGNOSIS — Z9889 Other specified postprocedural states: Secondary | ICD-10-CM | POA: Diagnosis not present

## 2016-02-23 NOTE — Progress Notes (Signed)
Please see the Nurse Progress Note in the MD Initial Consult Encounter for this patient. 

## 2016-02-23 NOTE — Progress Notes (Signed)
Radiation Oncology         [phone] ________________________________  Name: Teresa Frey MRN: [id number]  Date: 02/23/2016  DOB: 01/09/1930  Re-Evaluation Visit Note  CC: Nyoka Cowden, MD  Truitt Merle, MD    ICD-9-CM ICD-10-CM   1. Breast cancer of upper-outer quadrant of right female breast ([place]) 174.4 C50.411     Diagnosis: Stage IA (pT1c, pN0) invasive ductal carcinoma of the right breast (ER/PR +, HER2 -)  Interval Since Last Radiation: N/A  Narrative:  The patient returns today for a re-evaluation. The patient presented to Breast Clinic on 01/18/16. Right lumpectomy on 02/08/16 revealed grade 1 invasive ductal carcinoma and DCIS with the tumor measuring 1.1 cm, fibrocystic changes with calcifications, and atypical lobular hyperplasia. The margins were negative.  Of note, on 02/11/16, the patient presented to the ED for tachycardia and chest tightness. She was diagnosed with a new onset of A. Fib with RVR and she was admitted to the hospital for this.  The patient presents today to discuss the role of radiation for the management of her disease.  ALLERGIES:  has No Known Allergies.  Meds: Current Outpatient Prescriptions  Medication Sig Dispense Refill  . Ascorbic Acid (VITAMIN C) 1000 MG tablet Take 1,000 mg by mouth daily.    Marland Kitchen aspirin EC 81 MG EC tablet Take 1 tablet (81 mg total) by mouth daily. 30 tablet 0  . BIOTIN 5000 PO Take 1 tablet by mouth daily.    . metoprolol tartrate (LOPRESSOR) 25 MG tablet Take 1 tablet (25 mg total) by mouth 2 (two) times daily. 60 tablet 0  . Multiple Vitamins-Minerals (CENTRUM SILVER ADULT 50+) TABS Take 1 tablet by mouth daily.    Marland Kitchen omega-3 acid ethyl esters (LOVAZA) 1 g capsule Take by mouth 2 (two) times daily.    . sertraline (ZOLOFT) 50 MG tablet Take 1 tablet (50 mg total) by mouth daily. 90 tablet 3  . calcium-vitamin D (OSCAL WITH D) 500-200 MG-UNIT TABS tablet Take 1 tablet by mouth daily.    . cholecalciferol (VITAMIN  D) 1000 units tablet Take 1,000 Units by mouth daily.    Marland Kitchen HYDROcodone-acetaminophen (NORCO/VICODIN) 5-325 MG tablet Take 1-2 tablets by mouth every 4 (four) hours as needed. (Patient not taking: Reported on 02/23/2016) 30 tablet 0  . vitamin B-12 (CYANOCOBALAMIN) 250 MCG tablet Take 250 mcg by mouth daily.     No current facility-administered medications for this encounter.     Physical Findings: The patient is in no acute distress. Patient is alert and oriented.  height is '5\' 4"'$  (1.626 m) and weight is 158 lb 11.2 oz (72 kg). Her oral temperature is 98.1 F (36.7 C). Her blood pressure is 140/43 (abnormal) and her pulse is 59 (abnormal). Her oxygen saturation is 100%.   Lungs are clear to auscultation bilaterally. Heart has regular rate and rhythm. Heart monitor leads in place. Exam of the right breast shows a periareolar scar, no sign of drainage or infection, some induration under the scar.  Lab Findings: Lab Results  Component Value Date   WBC 9.7 02/12/2016   HGB 14.0 02/12/2016   HCT 43.7 02/12/2016   MCV 94.4 02/12/2016   PLT 203 02/12/2016    Radiographic Findings: Dg Chest Portable 1 View  Result Date: 02/11/2016 CLINICAL DATA:  Palpitations and chest tightness. EXAM: PORTABLE CHEST 1 VIEW COMPARISON:  07/30/2013 FINDINGS: Aortic atherosclerosis. The heart size and mediastinal contours are within normal limits. Both lungs are clear. The visualized  skeletal structures are unremarkable. IMPRESSION: No active disease. Electronically Signed   By: Kerby Moors M.D.   On: 02/11/2016 18:21    Impression: Stage IA (pT1c, pN0) invasive ductal carcinoma of the right breast (ER/PR +, HER2 -)  The patient is a good candidate for radiation therapy for breast conservation. The patient is also likely to be a good candidate for hormonal therapy. I dicussed the pros and cons of each treatment approach. I discussed that she would likely not need both therapies in light of her age and good  prognosis. At this time, the patient is leaning towards adjuvant hormonal therapy alone.  Plan: The patient will meet with Dr. Burr Medico next week for discussion of adjuvant hormonal therapy. If the patient is not a candidate for this therapy, then she will be reconsidered for adjuvant radiation therapy. ____________________________________ -----------------------------------  Blair Promise, PhD, MD  This document serves as a record of services personally performed by Gery Pray, MD. It was created on his behalf by Darcus Austin, a trained medical scribe. The creation of this record is based on the scribe's personal observations and the provider's statements to them. This document has been checked and approved by the attending provider.

## 2016-02-27 ENCOUNTER — Encounter: Payer: Self-pay | Admitting: Hematology

## 2016-02-27 ENCOUNTER — Telehealth: Payer: Self-pay | Admitting: Hematology

## 2016-02-27 ENCOUNTER — Ambulatory Visit (HOSPITAL_BASED_OUTPATIENT_CLINIC_OR_DEPARTMENT_OTHER): Payer: Medicare Other | Admitting: Hematology

## 2016-02-27 VITALS — BP 144/46 | HR 64 | Temp 98.2°F | Resp 18 | Ht 64.0 in | Wt 161.0 lb

## 2016-02-27 DIAGNOSIS — C50411 Malignant neoplasm of upper-outer quadrant of right female breast: Secondary | ICD-10-CM | POA: Diagnosis not present

## 2016-02-27 DIAGNOSIS — I10 Essential (primary) hypertension: Secondary | ICD-10-CM | POA: Diagnosis not present

## 2016-02-27 MED ORDER — ANASTROZOLE 1 MG PO TABS: 1.0000 mg | ORAL_TABLET | Freq: Every day | ORAL | 2 refills | Status: DC

## 2016-02-27 NOTE — Telephone Encounter (Signed)
Gave patient avs report and appointments for November. Per YF medication mention in the los is an oral medication.

## 2016-02-27 NOTE — Progress Notes (Signed)
[place]  [phone] [phone]  Clinic follow up Note   Patient Care Team: Marletta Lor, MD as PCP - General Fanny Skates, MD as Consulting Physician (General Surgery) Truitt Merle, MD as Consulting Physician (Hematology) Gery Pray, MD as Consulting Physician (Radiation Oncology) 02/27/2016  CHIEF COMPLAINTS:  Follow up right breast cancer  Oncology History   Breast cancer of upper-outer quadrant of right female breast [facility])   Staging form: Breast, AJCC 7th Edition   - Clinical stage from 01/18/2016: Stage IA (T1b, N0, M0) - Unsigned   - Pathologic stage from 02/08/2016: Stage IA (T1c, N0, cM0) - Signed by Truitt Merle, MD on 02/27/2016      Breast cancer of upper-outer quadrant of right female breast ([place])   01/05/2016 Mammogram    Diagnostic mammogram and ultrasound showed a 9 mm mass in the upper outer quadrant of right breast, axillary nodes were negative.      01/09/2016 Initial Diagnosis    Breast cancer of upper-outer quadrant of right female breast ([place])      01/09/2016 Initial Biopsy    R breast 9:00 position biopsy showed invasive ductal carcinoma, G1-2      01/09/2016 Receptors her2    ER 90%+, PR 40%+, HER2-, Ki67 5%      02/08/2016 Surgery    Right breast lumpectomy, no SLN biopsy        02/08/2016 Pathology Results    Right breast lumpectomy showed invasive and in situ ductal carcinoma, 1.1 cm, margins were negative. Grade 1, no lymphovascular invasion. Atypical lobular hyperplasia.        HISTORY OF PRESENTING ILLNESS:  Teresa Frey 82 y.o. female is here because of Her recently diagnosed right breast cancer. She is accompanied by her daughter Teresa Frey to our multidisciplinary clinic today.  This was discovered by screening mammogram. She never had abnormal screening mammogram or breast biopsy in the past. The diagnostic mammogram showed a 6 mm mass in the upper outer quadrant of right breast, it measured 9 mm by  ultrasound. A history nodes were negative by ultrasound. She underwent ultrasound guided biopsy on 01/09/2016, which showed invasive ductal carcinoma, ER/PR positive, HER-2 negative.  She lives alone, lives with her husband, who had multiple cancer, on tube feeds, not active. She is his caregiver. She is very independent. She still works full-time for [employer]. She denies any symptoms. Her daughter Teresa Frey was diagnosed with breast cancer at age of 33. Her son died at age of 36 from alcohol. She helps to take of her grandchildren who is 85.   GYN HISTORY  Menarchal:13 LMP: late 6's  Contraceptive: a few years  HRT: no G2P2: her son died in 56's, she has a daughter   CURRENT THERAPY: pending anastrozole   INTERIM HISTORY: Ms Bischoff returns for follow-up. She underwent right breast lumpectomy by Dr. Dalbert Batman about 2 weeks ago. She tolerated surgery very well, and has recovered well. She had a triple fibrillation after surgery, is currently on cardiac monitoring. She has minimal pain at the incision, no other complaints. Her energy and appetite has been back to normal, she remains to be physically active.  MEDICAL HISTORY:  Past Medical History:  Diagnosis Date  . ANXIETY 04/07/2007  . Breast cancer of upper-outer quadrant of right female breast ([place]) 01/11/2016  . COLONIC POLYPS, HX OF 04/02/2007   adenomatous  . DIVERTICULITIS, HX OF 04/02/2007  . Diverticulosis   . DJD (degenerative joint disease)   .  Hemorrhoids   . HYPERTENSION 04/02/2007  . OSTEOARTHRITIS, SEVERE 04/07/2007  . 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 WEIGHT GAIN 11/13/2007    SURGICAL HISTORY: Past Surgical History:  Procedure Laterality Date  . APPENDECTOMY    . BREAST LUMPECTOMY WITH RADIOACTIVE SEED LOCALIZATION Right 02/08/2016   Procedure: RIGHT BREAST LUMPECTOMY WITH RADIOACTIVE SEED LOCALIZATION;  Surgeon: Fanny Skates, MD;  Location: [place];  Service: General;  Laterality: Right;    . TONSILLECTOMY    . TOTAL HIP ARTHROPLASTY  1999, left hip    2006 right hip  . TUBAL LIGATION      SOCIAL HISTORY: Social History   Social History  . Marital status: Married    Spouse name: N/A  . Number of children: 2  . Years of education: N/A   Occupational History  . Not on file.   Social History Main Topics  . Smoking status: Former Smoker    Packs/day: 1.00    Years: 27.00    Types: Cigarettes    Quit date: 06/04/1978  . Smokeless tobacco: Never Used  . Alcohol use 4.2 oz/week    7 Glasses of wine per week     Comment: 1-2 glasses wine after dinner, but not necessarily each day  . Drug use: No  . Sexual activity: Not on file   Other Topics Concern  . Not on file   Social History Narrative  . No narrative on file    FAMILY HISTORY: Family History  Problem Relation Age of Onset  . Pancreatic cancer Mother 52    d. 52  . Cancer Father 48    hodgkin's lymphoma and leukemia; d. 89  . Colon cancer Brother     dx. late 47s; smoker  . Pancreatic cancer Brother 80    d. 27; smoker  . Lung cancer Brother 40    smoker  . Heart attack Brother     d. late 66s  . Colon cancer Paternal Aunt   . Breast cancer Daughter 50    negative genetic testing in 2016  . Skin cancer Daughter     non-melanoma type; +sun exposure  . Kidney failure Son 45    +EtOH abuse resulting in liver, kidney, and pancreas failure  . Diabetes Maternal Uncle     d. later age  . Heart disease Neg Hx     family  . Esophageal cancer Neg Hx   . Rectal cancer Neg Hx   . Stomach cancer Neg Hx     ALLERGIES:  has No Known Allergies.  MEDICATIONS:  Current Outpatient Prescriptions  Medication Sig Dispense Refill  . Ascorbic Acid (VITAMIN C) 1000 MG tablet Take 1,000 mg by mouth daily.    Marland Kitchen aspirin EC 81 MG EC tablet Take 1 tablet (81 mg total) by mouth daily. 30 tablet 0  . BIOTIN 5000 PO Take 1 tablet by mouth daily.    . metoprolol tartrate (LOPRESSOR) 25 MG tablet Take 1 tablet (25  mg total) by mouth 2 (two) times daily. 60 tablet 0  . Multiple Vitamins-Minerals (CENTRUM SILVER ADULT 50+) TABS Take 1 tablet by mouth daily.    Marland Kitchen omega-3 acid ethyl esters (LOVAZA) 1 g capsule Take by mouth 2 (two) times daily.    . sertraline (ZOLOFT) 50 MG tablet Take 1 tablet (50 mg total) by mouth daily. 90 tablet 3  . anastrozole (ARIMIDEX) 1 MG tablet Take 1 tablet (1 mg total) by mouth  daily. 30 tablet 2   No current facility-administered medications for this visit.     REVIEW OF SYSTEMS:   Constitutional: Denies fevers, chills or abnormal night sweats Eyes: Denies blurriness of vision, double vision or watery eyes Ears, nose, mouth, throat, and face: Denies mucositis or sore throat Respiratory: Denies cough, dyspnea or wheezes Cardiovascular: Denies palpitation, chest discomfort or lower extremity swelling Gastrointestinal:  Denies nausea, heartburn or change in bowel habits Skin: Denies abnormal skin rashes Lymphatics: Denies new lymphadenopathy or easy bruising Neurological:Denies numbness, tingling or new weaknesses Behavioral/Psych: Mood is stable, no new changes  All other systems were reviewed with the patient and are negative.  PHYSICAL EXAMINATION: ECOG PERFORMANCE STATUS: 0 - Asymptomatic  Vitals:   02/27/16 1420  BP: (!) 144/46  Pulse: 64  Resp: 18  Temp: 98.2 F (36.8 C)   Filed Weights   02/27/16 1420  Weight: 161 lb (73 kg)    GENERAL:alert, no distress and comfortable SKIN: skin color, texture, turgor are normal, no rashes or significant lesions EYES: normal, conjunctiva are pink and non-injected, sclera clear OROPHARYNX:no exudate, no erythema and lips, buccal mucosa, and tongue normal  NECK: supple, thyroid normal size, non-tender, without nodularity LYMPH:  no palpable lymphadenopathy in the cervical, axillary or inguinal LUNGS: clear to auscultation and percussion with normal breathing effort HEART: regular rate & rhythm and no murmurs and no  lower extremity edema ABDOMEN:abdomen soft, non-tender and normal bowel sounds Musculoskeletal:no cyanosis of digits and no clubbing  PSYCH: alert & oriented x 3 with fluent speech NEURO: no focal motor/sensory deficits Breasts: Breast inspection showed them to be symmetrical with no nipple discharge. The incision in the right breast is healing very well, no discharge or skin erythema. There is a small to moderate size of seroma or scar underneath the incision. Palpation of the breasts and axilla revealed no obvious mass that I could appreciate.    LABORATORY DATA:  I have reviewed the data as listed CBC Latest Ref Rng & Units 02/12/2016 02/11/2016 01/18/2016  WBC 4.0 - 10.5 K/uL 9.7 11.8(H) 9.8  Hemoglobin 12.0 - 15.0 g/dL 14.0 14.7 14.9  Hematocrit 36.0 - 46.0 % 43.7 45.3 45.1  Platelets 150 - 400 K/uL 203 225 205   CMP Latest Ref Rng & Units 02/13/2016 02/12/2016 02/11/2016  Glucose 65 - 99 mg/dL 114(H) 101(H) 136(H)  BUN 6 - 20 mg/dL 16 12 13   Creatinine 0.44 - 1.00 mg/dL 0.69 0.62 0.76  Sodium 135 - 145 mmol/L 141 142 142  Potassium 3.5 - 5.1 mmol/L 4.2 3.4(L) 3.4(L)  Chloride 101 - 111 mmol/L 108 108 107  CO2 22 - 32 mmol/L 26 27 25   Calcium 8.9 - 10.3 mg/dL 9.1 8.9 9.2  Total Protein 6.4 - 8.3 g/dL - - -  Total Bilirubin 0.20 - 1.20 mg/dL - - -  Alkaline Phos 40 - 150 U/L - - -  AST 5 - 34 U/L - - -  ALT 0 - 55 U/L - - -   PATHOLOGY REPORT  Diagnosis 01/09/2016 Breast, right, needle core biopsy, 9 o'clock 3 cm fn INVASIVE DUCTAL CARCINOMA, GRADE 1 TO 2  Results: IMMUNOHISTOCHEMICAL AND MORPHOMETRIC ANALYSIS PERFORMED MANUALLY Estrogen Receptor: 90%, POSITIVE, STRONG STAINING INTENSITY Progesterone Receptor: 40%, POSITIVE, STRONG STAINING INTENSITY Proliferation Marker Ki67: 5%  Results: HER2 - NEGATIVE RATIO OF HER2/CEP17 SIGNALS 1.17 AVERAGE HER2 COPY NUMBER PER CELL 2.45  Diagnosis 02/08/2016 Breast, lumpectomy, Right - INVASIVE AND IN SITU DUCTAL CARCINOMA, 1.1 CM. -  MARGINS  NOT INVOLVED. - FIBROCYSTIC CHANGES WITH CALCIFICATIONS. - LOBULAR NEOPLASIA (ATYPICAL LOBULAR HYPERPLASIA). - PREVIOUS BIOPSY SITE. Microscopic Comment BREAST, INVASIVE TUMOR, WITHOUT LYMPH NODES PRESENT Specimen, including laterality: Right breast. Procedure: Localized lumpectomy. Histologic type: Ductal Grade:1 Tubule formation: 1 Nuclear pleomorphism: 2 Mitotic: 1 Tumor size (gross measurement): 1.1 cm Margins: Free of tumor Invasive, distance to closest margin: 0.5 cm from the anterior margin In-situ, distance to closest margin: 0.7 cm from the anterior margin Lymphovascular invasion: Not identified Ductal carcinoma in situ: Present Grade: Low grade Extensive intraductal component: No Lobular neoplasia: Yes, atypical lobular hyperplasia Tumor focality: Unifocal Treatment effect: No If present, treatment effect in breast tissue, lymph nodes or both: N/A Extent of tumor: Skin: N/A Nipple: N/A 1 of 2 FINAL for Sob[place], Laketia E ([id number]) Microscopic Comment(continued) Skeletal muscle: N/A Breast prognostic profile: Case number [id number] Estrogen receptor: 90%, positive, strong staining Progesterone receptor: 40%, positive, strong staining Her 2 neu: Negative, ratio 1.17 Ki-67: 5% Non-neoplastic breast: Fibrocystic changes with calcification TNM: pT1c, pNX, pMX Comments: There is atypical lobular hyperplasia, which is focally at the anterior margin. (JDP:ecj 02/09/2016) Claudette Laws MD Pathologist, Electronic Signature (Case signed 02/09/2016)   RADIOGRAPHIC STUDIES: I have personally reviewed the radiological images as listed and agreed with the findings in the report. Dg Chest Portable 1 View  Result Date: 02/11/2016 CLINICAL DATA:  Palpitations and chest tightness. EXAM: PORTABLE CHEST 1 VIEW COMPARISON:  07/30/2013 FINDINGS: Aortic atherosclerosis. The heart size and mediastinal contours are within normal limits. Both lungs are clear. The visualized  skeletal structures are unremarkable. IMPRESSION: No active disease. Electronically Signed   By: Kerby Moors M.D.   On: 02/11/2016 18:21    Her outside mammogram and ultrasound image in report were reviewed.  Diagnostic mammogram and ultrasound of right breast 01/05/2016 Breast composition Repeat. There is a new 6 mm irregular high density architecture distortion in the right breast 10:00 middle tabs. It is measured 9 mm meter by ultrasound. Right axillary was negative for adenopathy. The mass is highly suspicious for malignancy.  ASSESSMENT & PLAN: 82 year old Caucasian female, with screening discovered right breast cancer.  1. Breast cancer of upper-outer quadrant of right breast, invasive ductal carcinoma, PT1CN0M0, stage IA, ER+/PR+/HER2- -I reviewed her surgical pathology findings with patient in great details. -She has early-stage breast cancer, had a computed surgical resection. -Giving the low Ki-67 and agree 1 disease, I think this is likely a low risk cancer. I do not recommend adjuvant chemotherapy or Oncotype --Given the strong ER and PR positivity, I recommend her to consider adjuvant aromatase inhibitor to reduce her risk of cancer recurrence,  The potential benefit and side effects, which includes but not limited to, hot flash, skin and vaginal dryness, metabolic changes ( increased blood glucose, cholesterol, weight, etc.), slightly in increased risk of cardiovascular disease, cataracts, muscular and joint discomfort, osteopenia and osteoporosis, etc, were discussed with her in great details. She is 22, but very healthy and active, would be a candidate for endocrine therapy. She has decided not to take adjuvant breast radiation, we discussed that adjuvant endocrine therapy reduce her risk of local and distant recurrence. Due to her advanced age, if she experiences significant side effects, I would have low threshold to stop her aromatase inhibitor. -After the above discussion,  patient has agreed to try anastrozole. I called into her pharmacy. She will start in 2 weeks -We also reviewed breast cancer surveillance. She will continue screening mammogram every year, I encouraged her to do self exam,  and follow-up with [place] regularly.  2. Genetics -Her daughter was diagnosed with breast cancer at age of 34. -She has strong family history of colon cancer, pancreatic cancer, etc. I'll refer her to genetic counseling to ruled out inch syndrome or other inheritable cancer syndrome.  3. HTN -She will follow-up with her primary care physician  Plan -She will start anastrozole in 2 weeks, I called into her pharmacy today -I'll see her back in 2 months with lab.   All questions were answered. The patient knows to call the clinic with any problems, questions or concerns. I spent 25 minutes counseling the patient face to face. The total time spent in the appointment was 30 minutes and more than 50% was on counseling.     Truitt Merle, MD 02/27/2016 2:59 PM

## 2016-02-29 ENCOUNTER — Encounter: Payer: Self-pay | Admitting: Internal Medicine

## 2016-02-29 ENCOUNTER — Ambulatory Visit (INDEPENDENT_AMBULATORY_CARE_PROVIDER_SITE_OTHER): Payer: Medicare Other | Admitting: Internal Medicine

## 2016-02-29 VITALS — BP 138/70 | HR 63 | Temp 97.8°F | Resp 20 | Ht 64.0 in | Wt 159.5 lb

## 2016-02-29 DIAGNOSIS — I4891 Unspecified atrial fibrillation: Secondary | ICD-10-CM | POA: Diagnosis not present

## 2016-02-29 DIAGNOSIS — I10 Essential (primary) hypertension: Secondary | ICD-10-CM | POA: Diagnosis not present

## 2016-02-29 DIAGNOSIS — C50411 Malignant neoplasm of upper-outer quadrant of right female breast: Secondary | ICD-10-CM

## 2016-02-29 DIAGNOSIS — Z23 Encounter for immunization: Secondary | ICD-10-CM | POA: Diagnosis not present

## 2016-02-29 NOTE — Progress Notes (Signed)
Subjective:    Patient ID: Teresa Frey, female    DOB: July 19, 1929, 82 y.o.   MRN: [id number]  HPI  Admit date: 02/11/2016 Discharge date: 02/13/2016  Recommendations for Outpatient Follow-up:  1. Dr. [name], Cardiology: MDs office will arrange outpatient heart monitor and follow-up. 2. Dr. Fanny Skates, Surgery: Patient has prior appointment to be seen in 2 weeks. 3. Dr. Bluford Kaufmann, PCP in 2 weeks.  Discharge Diagnoses:  Principal Problem:   New onset atrial fibrillation (HCC) Active Problems:   Essential hypertension   URI (upper respiratory infection)   Breast cancer of upper-outer quadrant of right female breast ([facility])   Atrial fibrillation with RVR (HCC)   Surgical wound infection   possible sleep apnea by history   82 year old patient who is seen following a recent hospital discharge approximately 2 weeks ago.  She presented with new onset atrial fibrillation with rapid ventricular response.  She was discharged on aspirin.  Presently is undergoing monitoring.  No prior history of atrial fibrillation or palpitations.  She is scheduled for cardiology follow-up Doing remarkably well.  Has been released by general surgery and is followed by oncology.  Due to a recent diagnosis of right breast cancer  Past Medical History:  Diagnosis Date  . ANXIETY 04/07/2007  . Breast cancer of upper-outer quadrant of right female breast ([place]) 01/11/2016  . COLONIC POLYPS, HX OF 04/02/2007   adenomatous  . DIVERTICULITIS, HX OF 04/02/2007  . Diverticulosis   . DJD (degenerative joint disease)   . Hemorrhoids   . HYPERTENSION 04/02/2007  . OSTEOARTHRITIS, SEVERE 04/07/2007  . 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 WEIGHT GAIN 11/13/2007     Social History   Social History  . Marital status: Married    Spouse name: N/A  . Number of children: 2  . Years of education: N/A   Occupational History  . Not on file.   Social History Main Topics  . Smoking  status: Former Smoker    Packs/day: 1.00    Years: 27.00    Types: Cigarettes    Quit date: 06/04/1978  . Smokeless tobacco: Never Used  . Alcohol use 4.2 oz/week    7 Glasses of wine per week     Comment: 1-2 glasses wine after dinner, but not necessarily each day  . Drug use: No  . Sexual activity: Not on file   Other Topics Concern  . Not on file   Social History Narrative  . No narrative on file    Past Surgical History:  Procedure Laterality Date  . APPENDECTOMY    . BREAST LUMPECTOMY WITH RADIOACTIVE SEED LOCALIZATION Right 02/08/2016   Procedure: RIGHT BREAST LUMPECTOMY WITH RADIOACTIVE SEED LOCALIZATION;  Surgeon: Fanny Skates, MD;  Location: [place];  Service: General;  Laterality: Right;  . TONSILLECTOMY    . TOTAL HIP ARTHROPLASTY  1999, left hip    2006 right hip  . TUBAL LIGATION      Family History  Problem Relation Age of Onset  . Pancreatic cancer Mother 11    d. 43  . Cancer Father 102    hodgkin's lymphoma and leukemia; d. 26  . Colon cancer Brother     dx. late 35s; smoker  . Pancreatic cancer Brother 80    d. 88; smoker  . Lung cancer Brother 40    smoker  . Heart attack Brother     d. late 6s  . Colon  cancer Paternal Aunt   . Breast cancer Daughter 72    negative genetic testing in 2016  . Skin cancer Daughter     non-melanoma type; +sun exposure  . Kidney failure Son 66    +EtOH abuse resulting in liver, kidney, and pancreas failure  . Diabetes Maternal Uncle     d. later age  . Heart disease Neg Hx     family  . Esophageal cancer Neg Hx   . Rectal cancer Neg Hx   . Stomach cancer Neg Hx     No Known Allergies  Current Outpatient Prescriptions on File Prior to Visit  Medication Sig Dispense Refill  . anastrozole (ARIMIDEX) 1 MG tablet Take 1 tablet (1 mg total) by mouth daily. 30 tablet 2  . Ascorbic Acid (VITAMIN C) 1000 MG tablet Take 1,000 mg by mouth daily.    Marland Kitchen aspirin EC 81 MG EC tablet Take 1 tablet (81 mg  total) by mouth daily. 30 tablet 0  . BIOTIN 5000 PO Take 1 tablet by mouth daily.    . metoprolol tartrate (LOPRESSOR) 25 MG tablet Take 1 tablet (25 mg total) by mouth 2 (two) times daily. 60 tablet 0  . Multiple Vitamins-Minerals (CENTRUM SILVER ADULT 50+) TABS Take 1 tablet by mouth daily.    Marland Kitchen omega-3 acid ethyl esters (LOVAZA) 1 g capsule Take by mouth 2 (two) times daily.    . sertraline (ZOLOFT) 50 MG tablet Take 1 tablet (50 mg total) by mouth daily. 90 tablet 3   No current facility-administered medications on file prior to visit.     BP 138/70 (BP Location: Left Arm, Patient Position: Sitting, Cuff Size: Normal)   Pulse 63   Temp 97.8 F (36.6 C) (Oral)   Resp 20   Ht 5\' 4"  (1.626 m)   Wt 159 lb 8 oz (72.3 kg)   SpO2 95%   BMI 27.38 kg/m     Review of Systems  Constitutional: Negative.   HENT: Negative for congestion, dental problem, hearing loss, rhinorrhea, sinus pressure, sore throat and tinnitus.   Eyes: Negative for pain, discharge and visual disturbance.  Respiratory: Negative for cough and shortness of breath.   Cardiovascular: Negative for chest pain, palpitations and leg swelling.  Gastrointestinal: Negative for abdominal distention, abdominal pain, blood in stool, constipation, diarrhea, nausea and vomiting.  Genitourinary: Negative for difficulty urinating, dysuria, flank pain, frequency, hematuria, pelvic pain, urgency, vaginal bleeding, vaginal discharge and vaginal pain.  Musculoskeletal: Negative for arthralgias, gait problem and joint swelling.  Skin: Negative for rash.  Neurological: Negative for dizziness, syncope, speech difficulty, weakness, numbness and headaches.  Hematological: Negative for adenopathy.  Psychiatric/Behavioral: Negative for agitation, behavioral problems and dysphoric mood. The patient is not nervous/anxious.        Objective:   Physical Exam  Constitutional: She is oriented to person, place, and time. She appears  well-developed and well-nourished.  HENT:  Head: Normocephalic.  Right Ear: External ear normal.  Left Ear: External ear normal.  Mouth/Throat: Oropharynx is clear and moist.  Eyes: Conjunctivae and EOM are normal. Pupils are equal, round, and reactive to light.  Neck: Normal range of motion. Neck supple. No thyromegaly present.  Cardiovascular: Normal rate, regular rhythm, normal heart sounds and intact distal pulses.   Pulmonary/Chest: Effort normal and breath sounds normal.  Abdominal: Soft. Bowel sounds are normal. She exhibits no mass. There is no tenderness.  Musculoskeletal: Normal range of motion.  Trace left ankle edema  Lymphadenopathy:  She has no cervical adenopathy.  Neurological: She is alert and oriented to person, place, and time.  Skin: Skin is warm and dry. No rash noted.  Psychiatric: She has a normal mood and affect. Her behavior is normal.          Assessment & Plan:   Paroxysmal atrial fibrillation.  Presently on aspirin only.  Monitor in progress.  Follow-up cardiology.  Consider chronic anticoagulation.  If recurrent A. Fib History right breast cancer.  Follow-up oncology Essential hypertension.  Continue metoprolol  Follow-up in the spring as scheduled Oncology and cardiology follow-up  Nyoka Cowden

## 2016-02-29 NOTE — Progress Notes (Signed)
Pre visit review using our clinic review tool, if applicable. No additional management support is needed unless otherwise documented below in the visit note. 

## 2016-02-29 NOTE — Patient Instructions (Signed)
Limit your sodium (Salt) intake  Oncology and cardiology follow-up  Call or return to clinic prn if these symptoms worsen or fail to improve as anticipated.  Return in the spring for routine follow-up as scheduled

## 2016-03-01 ENCOUNTER — Other Ambulatory Visit: Payer: Self-pay | Admitting: *Deleted

## 2016-03-01 DIAGNOSIS — C50411 Malignant neoplasm of upper-outer quadrant of right female breast: Secondary | ICD-10-CM

## 2016-03-07 ENCOUNTER — Telehealth: Payer: Self-pay | Admitting: Genetic Counselor

## 2016-03-07 NOTE — Telephone Encounter (Signed)
Discussed with Teresa Frey that her genetic test result was negative for any known pathogenic mutations within any of 32 genes on the Comprehensive Cancer Panel through [facility].  One variant of uncertain significance (VUS) called "c.1659G>A (p.[id number])" was found in one copy of the NBN gene.  Discussed that we treat this like a negative result until it gets updated by the lab and reviewed why we do that.  Encouraged [name] to keep her phone number up-to-date with [place], so that we can call her if/when this result gets updated.  She should continue to follow her doctors' cancer screening recommendations. She knows she is welcome to call with any questions she may have.  She would like a copy of her results mailed, and I am happy to do that.

## 2016-03-08 ENCOUNTER — Ambulatory Visit: Payer: Self-pay | Admitting: Genetic Counselor

## 2016-03-08 DIAGNOSIS — Z803 Family history of malignant neoplasm of breast: Secondary | ICD-10-CM

## 2016-03-08 DIAGNOSIS — C50411 Malignant neoplasm of upper-outer quadrant of right female breast: Secondary | ICD-10-CM

## 2016-03-08 DIAGNOSIS — Z800 Family history of malignant neoplasm of digestive organs: Secondary | ICD-10-CM

## 2016-03-08 DIAGNOSIS — Z1379 Encounter for other screening for genetic and chromosomal anomalies: Secondary | ICD-10-CM

## 2016-03-08 DIAGNOSIS — Z809 Family history of malignant neoplasm, unspecified: Secondary | ICD-10-CM

## 2016-03-12 ENCOUNTER — Ambulatory Visit (INDEPENDENT_AMBULATORY_CARE_PROVIDER_SITE_OTHER): Payer: Medicare Other | Admitting: Orthopaedic Surgery

## 2016-03-12 DIAGNOSIS — S7002XA Contusion of left hip, initial encounter: Secondary | ICD-10-CM | POA: Diagnosis not present

## 2016-03-27 ENCOUNTER — Ambulatory Visit (INDEPENDENT_AMBULATORY_CARE_PROVIDER_SITE_OTHER): Payer: Medicare Other | Admitting: Cardiology

## 2016-03-27 ENCOUNTER — Encounter: Payer: Self-pay | Admitting: Cardiology

## 2016-03-27 VITALS — BP 136/74 | HR 73 | Ht 64.0 in | Wt 156.0 lb

## 2016-03-27 DIAGNOSIS — I4891 Unspecified atrial fibrillation: Secondary | ICD-10-CM

## 2016-03-27 DIAGNOSIS — C50411 Malignant neoplasm of upper-outer quadrant of right female breast: Secondary | ICD-10-CM | POA: Diagnosis not present

## 2016-03-27 MED ORDER — METOPROLOL TARTRATE 25 MG PO TABS: 12.5000 mg | ORAL_TABLET | Freq: Two times a day (BID) | ORAL | 1 refills | Status: DC

## 2016-03-27 NOTE — Assessment & Plan Note (Signed)
S/P lumpectomy 02/08/16

## 2016-03-27 NOTE — Progress Notes (Signed)
03/27/2016 Teresa Frey   Jul 08, 1929  [id number]  Primary Physician Nyoka Cowden, MD Primary Cardiologist: Dr Percival Spanish  HPI:  Pleasant 82 y/o female with a history of breat cancer, s/p lumpectomy 02/08/16, presented to the ED 02/11/16 with AF with RVR. She was reasonably sure of the onset. She converted I the ED to NSR spontaneously. There was a history of documnetd OTC decongestant use as well.  Dr Percival Spanish felt she was a CHADs VASc=3, but felt she could be discharged on ASA and beta blocker and follow up as an OP. If she has recurrent PAF she will need to be changed to a NOAC. She had an echo and an Event monitor and is here for follow up. She denies any recurrent palpations or tachycardia. Her echo was normal. Her Holter showed NSR, SB.     Current Outpatient Prescriptions  Medication Sig Dispense Refill  . anastrozole (ARIMIDEX) 1 MG tablet Take 1 tablet (1 mg total) by mouth daily. 30 tablet 2  . Ascorbic Acid (VITAMIN C) 1000 MG tablet Take 1,000 mg by mouth daily.    Marland Kitchen aspirin EC 81 MG EC tablet Take 1 tablet (81 mg total) by mouth daily. 30 tablet 0  . BIOTIN 5000 PO Take 1 tablet by mouth daily.    . metoprolol tartrate (LOPRESSOR) 25 MG tablet Take 0.5 tablets (12.5 mg total) by mouth 2 (two) times daily. 90 tablet 1  . Multiple Vitamins-Minerals (CENTRUM SILVER ADULT 50+) TABS Take 1 tablet by mouth daily.    Marland Kitchen omega-3 acid ethyl esters (LOVAZA) 1 g capsule Take by mouth 2 (two) times daily.    . sertraline (ZOLOFT) 50 MG tablet Take 1 tablet (50 mg total) by mouth daily. 90 tablet 3   No current facility-administered medications for this visit.     No Known Allergies  Social History   Social History  . Marital status: Married    Spouse name: N/A  . Number of children: 2  . Years of education: N/A   Occupational History  . Not on file.   Social History Main Topics  . Smoking status: Former Smoker    Packs/day: 1.00    Years: 27.00    Types: Cigarettes      Quit date: 06/04/1978  . Smokeless tobacco: Never Used  . Alcohol use 4.2 oz/week    7 Glasses of wine per week     Comment: 1-2 glasses wine after dinner, but not necessarily each day  . Drug use: No  . Sexual activity: Not on file   Other Topics Concern  . Not on file   Social History Narrative  . No narrative on file     Review of Systems: General: negative for chills, fever, night sweats or weight changes.  Cardiovascular: negative for chest pain, dyspnea on exertion, edema, orthopnea, palpitations, paroxysmal nocturnal dyspnea or shortness of breath Dermatological: negative for rash Respiratory: negative for cough or wheezing Urologic: negative for hematuria Abdominal: negative for nausea, vomiting, diarrhea, bright red blood per rectum, melena, or hematemesis Neurologic: negative for visual changes, syncope, or dizziness All other systems reviewed and are otherwise negative except as noted above.    Blood pressure 136/74, pulse 73, height 5\' 4"  (1.626 m), weight 156 lb (70.8 kg).  General appearance: alert, cooperative and no distress Neck: no carotid bruit and no JVD Lungs: clear to auscultation bilaterally Heart: regular rate and rhythm Extremities: extremities normal, atraumatic, no cyanosis or edema Skin: Skin color, texture, turgor  normal. No rashes or lesions Neurologic: Grossly normal   ASSESSMENT AND PLAN:   Atrial fibrillation with RVR (HCC) One episode of PAF with RVR, 4 days post op breast biopsy, and whit a history of recent OTC decongestant use. Converted in ED. No recurrence on f/u 03/27/16  Breast cancer of upper-outer quadrant of right female breast ([place]) S/P lumpectomy 02/08/16    PLAN  She is out of Lopressor 25 mg BID and has not continued with her ASA 81 mg. I suggested she take Lopressor 12.5 mg BID and resume ASA 81 mg daily. I also suggested she decrease her caffeine intake to one cup of coffee a day and avoid OTC decongestants. F/U in 6  months, if OK- f/u PRN after that. She knows if she has recurrent PAF she'll need to be anticoagulated.   Kerin Ransom PA-C 03/27/2016 2:53 PM

## 2016-03-27 NOTE — Assessment & Plan Note (Signed)
One episode of PAF with RVR, 4 days post op breast biopsy, and whit a history of recent OTC decongestant use. Converted in ED. No recurrence on f/u 03/27/16

## 2016-03-27 NOTE — Patient Instructions (Addendum)
Medication Instructions:  Your physician has recommended you make the following change in your medication:  1.  TAKE a Aspirin 81 mg daily 2.  START Metoprolol 25 mg taking 1/2 tablet twice a day  Labwork: None ordered  Testing/Procedures: None ordered  Follow-Up: Your physician wants you to follow-up in: [place] DR. [place].  ou will receive a reminder letter in the mail two months in advance. If you don't receive a letter, please call our office to schedule the follow-up appointment.     Any Other Special Instructions Will Be Listed Below (If Applicable).    If you need a refill on your cardiac medications before your next appointment, please call your pharmacy.

## 2016-04-01 DIAGNOSIS — Z1379 Encounter for other screening for genetic and chromosomal anomalies: Secondary | ICD-10-CM | POA: Insufficient documentation

## 2016-04-01 NOTE — Progress Notes (Signed)
GENETIC TEST RESULT  HPI: Ms. Predmore was previously seen in the [facility] due to a personal history of breast cancer, family history of breast, pancreatic, and other cancers, and concerns regarding a hereditary predisposition to cancer. Please refer to our prior cancer genetics clinic note from February 21, 2016 for more information regarding Ms. Radi's medical, social and family histories, and our assessment and recommendations, at the time. Ms. Eskelson recent genetic test results were disclosed to her, as were recommendations warranted by these results. These results and recommendations are discussed in more detail below.  GENETIC TEST RESULTS: At the time of Ms. Savas's visit on 02/21/16, we recommended she pursue genetic testing of the 32-gene Comprehensive Cancer Panel with MSH2 Exons 1-7 Inversion Analysis through [facility]. The Comprehensive Cancer Panel offered by [facility] Junius Roads, MD) includes sequencing and/or deletion duplication testing of the following 32 genes: APC, ATM, AXIN2, BARD1, BMPR1A, BRCA1, BRCA2, BRIP1, CDH1, CDK4, CDKN2A, CHEK2, EPCAM, FANCC, MLH1, MSH2, MSH6, MUTYH, NBN, PALB2, PMS2, POLD1, POLE, PTEN, RAD51C, RAD51D, SCG5/GREM1, SMAD4, STK11, TP53, VHL, and XRCC2.  Those results are now back, the report date for which is March 05, 2016.  Genetic testing was normal, and did not reveal a known deleterious mutation in these genes. One variant of uncertain significance (VUS) was found in the NBN gene.  The test report will be scanned into EPIC and will be located under the Results Review tab in the Pathology>Molecular Pathology section.   Genetic testing did identify a variant of uncertain significance (VUS) called "c.1659G>A (p.Met553Ile)" in one copy of the NBN gene. At this time, it is unknown if this VUS is associated with an increased risk for cancer or if this is a normal finding. Since this VUS result is uncertain, it cannot  help guide screening recommendations, and family members should not be tested for this VUS to help define their own cancer risks.  Also, we all have variants within our genes that make [place] unique individuals--most of these variants are benign.  Thus, we treat this VUS as a negative result until it gets updated by the lab.   With time, we suspect the lab will reclassify this variant and when they do, we will try to re-contact Ms. Bloch to discuss the reclassification further.  We also encouraged Ms. Winders to contact us in a year or two to obtain an update on the status of this VUS.  We discussed with Ms. Wheat that since the current genetic testing is not perfect, it is possible there may be a gene mutation in one of these genes that current testing cannot detect, but that chance is small. We also discussed, that it is possible that another gene that has not yet been discovered, or that we have not yet tested, is responsible for the cancer diagnoses in the family, and it is, therefore, important to remain in touch with cancer genetics in the future so that we can continue to offer Ms. Gutridge the most up-to-date genetic testing.   CANCER SCREENING RECOMMENDATIONS: This result is reassuring and indicates that Ms. Brodbeck likely does not have an increased risk for a future cancer due to a mutation in one of these genes. This normal test also suggests that Ms. Creasy's cancer was most likely not due to an inherited predisposition associated with one of these genes.  Most cancers happen by chance and this negative test suggests that her cancer falls into this category.  We, therefore, recommended she continue  to follow the cancer management and screening guidelines provided by her oncology and primary healthcare providers.   RECOMMENDATIONS FOR FAMILY MEMBERS: Women in this family might be at some increased risk of developing cancer, over the general population risk, simply due to the family history of cancer. We  recommended women in this family have a yearly mammogram beginning at age 56, or 18 years younger than the earliest onset of cancer, an annual clinical breast exam, and perform monthly breast self-exams. Women in this family should also have a gynecological exam as recommended by their primary provider. All family members should have a colonoscopy by age 43.  FOLLOW-UP: Lastly, we discussed with Ms. Florance that cancer genetics is a rapidly advancing field and it is possible that new genetic tests will be appropriate for her and/or her family members in the future. We encouraged her to remain in contact with cancer genetics on an annual basis so we can update her personal and family histories and let her know of advances in cancer genetics that may benefit this family.   Our contact number was provided. Ms. Nease questions were answered to her satisfaction, and she knows she is welcome to call us at anytime with additional questions or concerns.   Jeanine Luz, MS, [facility] Certified Genetic Counselor [email or web address] Phone: [phone]

## 2016-04-19 ENCOUNTER — Ambulatory Visit (INDEPENDENT_AMBULATORY_CARE_PROVIDER_SITE_OTHER): Payer: Medicare Other

## 2016-04-19 ENCOUNTER — Ambulatory Visit (INDEPENDENT_AMBULATORY_CARE_PROVIDER_SITE_OTHER): Payer: Medicare Other | Admitting: Orthopaedic Surgery

## 2016-04-19 DIAGNOSIS — M545 Low back pain, unspecified: Secondary | ICD-10-CM

## 2016-04-19 DIAGNOSIS — M25552 Pain in left hip: Secondary | ICD-10-CM

## 2016-04-19 MED ORDER — MELOXICAM 7.5 MG PO TABS: 7.5000 mg | ORAL_TABLET | Freq: Every day | ORAL | 0 refills | Status: DC

## 2016-04-19 NOTE — Progress Notes (Signed)
Office Visit Note   Patient: Teresa Frey           Date of Birth: 09/23/1929           MRN: [id number] Visit Date: 04/19/2016              Requested by: Marletta Lor, MD [place], [place] [id number] PCP: Nyoka Cowden, MD   Assessment & Plan: Visit Diagnoses:  1. Pain in left hip   2. Acute low back pain without sciatica, unspecified back pain laterality     Plan: At this point she is more or less reassured in her own mind it there is no acute injury to her back or her left hip replacement from her fall. She says this does give her peace of mind. I will send in some meloxicam for her and see if this can help from an inflammation and pain standpoint. I have offered her physical therapy but she says she does not have time to go. She can always call back if she like to have this set up for her. She is busy unfortunately taking care of a husband who has severe is Alzheimer's disease  Follow-Up Instructions: Return if symptoms worsen or fail to improve.   Orders:  Orders Placed This Encounter  Procedures  . XR HIP UNILAT W OR W/O PELVIS 1V LEFT  . XR Lumbar Spine 2-3 Views   Meds ordered this encounter  Medications  . meloxicam (MOBIC) 7.5 MG tablet    Sig: Take 1 tablet (7.5 mg total) by mouth daily.    Dispense:  60 tablet    Refill:  0      Procedures: No procedures performed   Clinical Data: No additional findings.   Subjective: Chief Complaint  Patient presents with  . Lower Back - Pain    Patient states hip and back hurting her still but she did fall after her last visit with [place].   . Left Hip - Pain    HPI About the day after I saw her last visit she had another mechanical fall at work when she tripped over a phone cord. She's had some low back pain and left hip pain since then. She is able to walk without assistive device but is been just a nagging pain. Review of Systems   Objective: Vital Signs: There were no vitals  taken for this visit.  Physical Exam  Ortho Exam She does have pain exam across her lumbar spine to the lowest aspects into the repair spinal muscles but no midline tenderness throughout the spine. Her left hip is a fluid range of motion and only hurts over the trochanteric area Specialty Comments:  No specialty comments available.  Imaging: Xr Hip Unilat W Or W/o Pelvis 1v Left  Result Date: 04/19/2016 An AP and lateral of the left hip shows a well-seated implant I see no acute injury.  Xr Lumbar Spine 2-3 Views  Result Date: 04/19/2016 An AP and lateral lumbar spine show some degenerative changes. I question whether or not there is a small compression fracture at L1 or L2 but this is not correlate with her clinical exam.    PMFS History: Patient Active Problem List   Diagnosis Date Noted  . Genetic testing 04/01/2016  . possible sleep apnea by history 02/12/2016  . Atrial fibrillation with RVR ([place]) 02/11/2016  . New onset atrial fibrillation ([place]) 02/11/2016  . Surgical wound infection 02/11/2016  . Breast cancer of upper-outer  quadrant of right female breast ([place]) 01/11/2016  . Impaired glucose tolerance 07/28/2014  . ANXIETY 04/07/2007  . Osteoarthritis 04/07/2007  . Essential hypertension 04/02/2007  . History of colonic polyps 04/02/2007  . DIVERTICULITIS, HX OF 04/02/2007   Past Medical History:  Diagnosis Date  . ANXIETY 04/07/2007  . Breast cancer of upper-outer quadrant of right female breast ([place]) 01/11/2016  . COLONIC POLYPS, HX OF 04/02/2007   adenomatous  . DIVERTICULITIS, HX OF 04/02/2007  . Diverticulosis   . DJD (degenerative joint disease)   . Hemorrhoids   . HYPERTENSION 04/02/2007  . OSTEOARTHRITIS, SEVERE 04/07/2007  . 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 WEIGHT GAIN 11/13/2007    Family History  Problem Relation Age of Onset  . Pancreatic cancer Mother 75    d. 54  . Cancer Father 8    hodgkin's lymphoma and leukemia; d. 41  .  Colon cancer Brother     dx. late 63s; smoker  . Pancreatic cancer Brother 80    d. 83; smoker  . Lung cancer Brother 40    smoker  . Heart attack Brother     d. late 18s  . Colon cancer Paternal Aunt   . Breast cancer Daughter 30    negative genetic testing in 2016  . Skin cancer Daughter     non-melanoma type; +sun exposure  . Kidney failure Son 61    +EtOH abuse resulting in liver, kidney, and pancreas failure  . Diabetes Maternal Uncle     d. later age  . Heart disease Neg Hx     family  . Esophageal cancer Neg Hx   . Rectal cancer Neg Hx   . Stomach cancer Neg Hx     Past Surgical History:  Procedure Laterality Date  . APPENDECTOMY    . BREAST LUMPECTOMY WITH RADIOACTIVE SEED LOCALIZATION Right 02/08/2016   Procedure: RIGHT BREAST LUMPECTOMY WITH RADIOACTIVE SEED LOCALIZATION;  Surgeon: Fanny Skates, MD;  Location: [facility];  Service: General;  Laterality: Right;  . TONSILLECTOMY    . TOTAL HIP ARTHROPLASTY  1999, left hip    2006 right hip  . TUBAL LIGATION     Social History   Occupational History  . Not on file.   Social History Main Topics  . Smoking status: Former Smoker    Packs/day: 1.00    Years: 27.00    Types: Cigarettes    Quit date: 06/04/1978  . Smokeless tobacco: Never Used  . Alcohol use 4.2 oz/week    7 Glasses of wine per week     Comment: 1-2 glasses wine after dinner, but not necessarily each day  . Drug use: No  . Sexual activity: Not on file

## 2016-04-23 ENCOUNTER — Encounter: Payer: Self-pay | Admitting: Genetic Counselor

## 2016-04-25 ENCOUNTER — Other Ambulatory Visit: Payer: Self-pay | Admitting: *Deleted

## 2016-04-25 DIAGNOSIS — Z170 Estrogen receptor positive status [ER+]: Principal | ICD-10-CM

## 2016-04-25 DIAGNOSIS — C50411 Malignant neoplasm of upper-outer quadrant of right female breast: Secondary | ICD-10-CM

## 2016-04-25 NOTE — Progress Notes (Signed)
Teresa Frey  [phone] [phone]) [phone]  Clinic follow up Note   Patient Care Team: Marletta Lor, MD as PCP - General Fanny Skates, MD as Consulting Physician (General Surgery) Truitt Merle, MD as Consulting Physician (Hematology) Gery Pray, MD as Consulting Physician (Radiation Oncology) 04/30/2016  CHIEF COMPLAINTS:  Follow up right breast cancer  Oncology History   Breast cancer of upper-outer quadrant of right female breast [facility])   Staging form: Breast, AJCC 7th Edition   - Clinical stage from 01/18/2016: Stage IA (T1b, N0, M0) - Unsigned   - Pathologic stage from 02/08/2016: Stage IA (T1c, N0, cM0) - Signed by Truitt Merle, MD on 02/27/2016      Breast cancer of upper-outer quadrant of right female breast ([place])   01/05/2016 Mammogram    Diagnostic mammogram and ultrasound showed a 9 mm mass in the upper outer quadrant of right breast, axillary nodes were negative.      01/09/2016 Initial Diagnosis    Breast cancer of upper-outer quadrant of right female breast ([place])      01/09/2016 Initial Biopsy    R breast 9:00 position biopsy showed invasive ductal carcinoma, G1-2      01/09/2016 Receptors her2    ER 90%+, PR 40%+, HER2-, Ki67 5%      02/08/2016 Surgery    Right breast lumpectomy, no SLN biopsy        02/08/2016 Pathology Results    Right breast lumpectomy showed invasive and in situ ductal carcinoma, 1.1 cm, margins were negative. Grade 1, no lymphovascular invasion. Atypical lobular hyperplasia.      02/28/2016 -  Anti-estrogen oral therapy    Anastrozole 1 mg daily        HISTORY OF PRESENTING ILLNESS:  Teresa Frey 82 y.o. female is here because of Her recently diagnosed right breast cancer. She is accompanied by her daughter Teresa Frey to our multidisciplinary clinic today.  This was discovered by screening mammogram. She never had abnormal screening mammogram or breast biopsy in the past. The diagnostic mammogram showed a 6 mm  mass in the upper outer quadrant of right breast, it measured 9 mm by ultrasound. A history nodes were negative by ultrasound. She underwent ultrasound guided biopsy on 01/09/2016, which showed invasive ductal carcinoma, ER/PR positive, HER-2 negative.  She lives alone, lives with her husband, who had multiple cancer, on tube feeds, not active. She is his caregiver. She is very independent. She still works full-time for [employer]. She denies any symptoms. Her daughter Teresa Frey was diagnosed with breast cancer at age of 29. Her son died at age of 43 from alcohol. She helps to take of her grandchildren who is 36.   GYN HISTORY  Menarchal:13 LMP: late 69's  Contraceptive: a few years  HRT: no G2P2: her son died in 31's, she has a daughter   CURRENT THERAPY: anastrozole 19m daily started on 02/28/2016   INTERIM HISTORY: [name] for follow-up. She started anastrazole 2 months ago. She has noticed new joint pain mostly in her left hip. She last fell about a month ago and saw her orthopaedic surgeon. She had no fracture at that time. She has had replacements and several falls. She denies bone pain. She denies vaginal dryness. She has not had a DEXA. She performs hip and leg exercises daily. She is not limited by her hip pain. Taking meloxicam.  She is on metoprolol. She drinks two or three cups of coffee daily with a glass or  two of wine in the evening. She denies headache, vision changes, and chest pain. She does complain of sharp pains and soreness in her right breast ever since surgery. She has noticed shortness of breath while walking briskly. She has noticed a little swelling in her ankles lately. She has gained 5 to 6 lbs recently. She denies hot flashes. She does not currently take calcium or Vitamin D.  MEDICAL HISTORY:  Past Medical History:  Diagnosis Date  . ANXIETY 04/07/2007  . Breast cancer of upper-outer quadrant of right female breast ([place]) 01/11/2016  . COLONIC POLYPS, HX OF  04/02/2007   adenomatous  . DIVERTICULITIS, HX OF 04/02/2007  . Diverticulosis   . DJD (degenerative joint disease)   . Hemorrhoids   . HYPERTENSION 04/02/2007  . OSTEOARTHRITIS, SEVERE 04/07/2007  . 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 WEIGHT GAIN 11/13/2007    SURGICAL HISTORY: Past Surgical History:  Procedure Laterality Date  . APPENDECTOMY    . BREAST LUMPECTOMY WITH RADIOACTIVE SEED LOCALIZATION Right 02/08/2016   Procedure: RIGHT BREAST LUMPECTOMY WITH RADIOACTIVE SEED LOCALIZATION;  Surgeon: Fanny Skates, MD;  Location: [place];  Service: General;  Laterality: Right;  . TONSILLECTOMY    . TOTAL HIP ARTHROPLASTY  1999, left hip    2006 right hip  . TUBAL LIGATION      SOCIAL HISTORY: Social History   Social History  . Marital status: Married    Spouse name: N/A  . Number of children: 2  . Years of education: N/A   Occupational History  . Not on file.   Social History Main Topics  . Smoking status: Former Smoker    Packs/day: 1.00    Years: 27.00    Types: Cigarettes    Quit date: 06/04/1978  . Smokeless tobacco: Never Used  . Alcohol use 4.2 oz/week    7 Glasses of wine per week     Comment: 1-2 glasses wine after dinner, but not necessarily each day  . Drug use: No  . Sexual activity: Not on file   Other Topics Concern  . Not on file   Social History Narrative  . No narrative on file    FAMILY HISTORY: Family History  Problem Relation Age of Onset  . Pancreatic cancer Mother 37    d. 91  . Cancer Father 22    hodgkin's lymphoma and leukemia; d. 3  . Colon cancer Brother     dx. late 75s; smoker  . Pancreatic cancer Brother 80    d. 72; smoker  . Lung cancer Brother 40    smoker  . Heart attack Brother     d. late 50s  . Colon cancer Paternal Aunt   . Breast cancer Daughter 18    negative genetic testing in 2016  . Skin cancer Daughter     non-melanoma type; +sun exposure  . Kidney failure Son 25    +EtOH  abuse resulting in liver, kidney, and pancreas failure  . Diabetes Maternal Uncle     d. later age  . Heart disease Neg Hx     family  . Esophageal cancer Neg Hx   . Rectal cancer Neg Hx   . Stomach cancer Neg Hx     ALLERGIES:  has No Known Allergies.  MEDICATIONS:  Current Outpatient Prescriptions  Medication Sig Dispense Refill  . anastrozole (ARIMIDEX) 1 MG tablet Take 1 tablet (1 mg total) by mouth daily. 30 tablet 2  .  Ascorbic Acid (VITAMIN C) 1000 MG tablet Take 1,000 mg by mouth daily.    Marland Kitchen aspirin EC 81 MG EC tablet Take 1 tablet (81 mg total) by mouth daily. 30 tablet 0  . BIOTIN 5000 PO Take 1 tablet by mouth daily.    . meloxicam (MOBIC) 7.5 MG tablet Take 1 tablet (7.5 mg total) by mouth daily. 60 tablet 0  . metoprolol tartrate (LOPRESSOR) 25 MG tablet Take 0.5 tablets (12.5 mg total) by mouth 2 (two) times daily. 90 tablet 1  . Multiple Vitamins-Minerals (CENTRUM SILVER ADULT 50+) TABS Take 1 tablet by mouth daily.    Marland Kitchen omega-3 acid ethyl esters (LOVAZA) 1 g capsule Take by mouth 2 (two) times daily.    . sertraline (ZOLOFT) 50 MG tablet Take 1 tablet (50 mg total) by mouth daily. 90 tablet 3   No current facility-administered medications for this visit.     REVIEW OF SYSTEMS:   Constitutional: Denies fevers, chills or abnormal night sweats Eyes: Denies blurriness of vision, double vision or watery eyes Ears, nose, mouth, throat, and face: Denies mucositis or sore throat Respiratory: Denies cough, dyspnea or wheezes (+) SOB with exertion Cardiovascular: Denies palpitation, chest discomfort or lower extremity swelling Gastrointestinal:  Denies nausea, heartburn or change in bowel habits Skin: Denies abnormal skin rashes Lymphatics: Denies new lymphadenopathy or easy bruising Musculoskeletal: (+) sharp pains in right breast, chronic left hip pain, mild ankle swelling Neurological:Denies numbness, tingling or new weaknesses Behavioral/Psych: Mood is stable, no new  changes  All other systems were reviewed with the patient and are negative.  PHYSICAL EXAMINATION: ECOG PERFORMANCE STATUS: 0 - Asymptomatic  Vitals:   04/30/16 1018  BP: (!) 171/71  Pulse: (!) 59  Resp: 17  Temp: 97.7 F (36.5 C)   Filed Weights   04/30/16 1018  Weight: 159 lb (72.1 kg)    GENERAL:alert, no distress and comfortable SKIN: skin color, texture, turgor are normal, no rashes or significant lesions EYES: normal, conjunctiva are pink and non-injected, sclera clear OROPHARYNX:no exudate, no erythema and lips, buccal mucosa, and tongue normal  NECK: supple, thyroid normal size, non-tender, without nodularity LYMPH:  no palpable lymphadenopathy in the cervical, axillary or inguinal LUNGS: clear to auscultation and percussion with normal breathing effort HEART: regular rate & rhythm and no murmurs and no lower extremity edema ABDOMEN:abdomen soft, non-tender and normal bowel sounds Musculoskeletal:no cyanosis of digits and no clubbing  PSYCH: alert & oriented x 3 with fluent speech NEURO: no focal motor/sensory deficits Breasts: Breast inspection showed them to be symmetrical with no nipple discharge. The incision in the right breast is well healed, no discharge or skin erythema. There is a small size scar underneath the incision. Palpation of the breasts and axilla revealed no obvious mass that I could appreciate.     LABORATORY DATA:  I have reviewed the data as listed CBC Latest Ref Rng & Units 04/30/2016 02/12/2016 02/11/2016  WBC 3.9 - 10.3 10e3/uL 8.9 9.7 11.8(H)  Hemoglobin 11.6 - 15.9 g/dL 14.0 14.0 14.7  Hematocrit 34.8 - 46.6 % 42.8 43.7 45.3  Platelets 145 - 400 10e3/uL 195 203 225   CMP Latest Ref Rng & Units 04/30/2016 02/13/2016 02/12/2016  Glucose 70 - 140 mg/dl 95 114(H) 101(H)  BUN 7.0 - 26.0 mg/dL 16.4 16 12   Creatinine 0.6 - 1.1 mg/dL 0.8 0.69 0.62  Sodium 136 - 145 mEq/L 142 141 142  Potassium 3.5 - 5.1 mEq/L 4.5 4.2 3.4(L)  Chloride 101 - 111  mmol/L - 108 108  CO2 22 - 29 mEq/L 28 26 27   Calcium 8.4 - 10.4 mg/dL 9.8 9.1 8.9  Total Protein 6.4 - 8.3 g/dL 6.6 - -  Total Bilirubin 0.20 - 1.20 mg/dL 0.53 - -  Alkaline Phos 40 - 150 U/L 200(H) - -  AST 5 - 34 U/L 18 - -  ALT 0 - 55 U/L 15 - -   PATHOLOGY REPORT  Diagnosis 01/09/2016 Breast, right, needle core biopsy, 9 o'clock 3 cm fn INVASIVE DUCTAL CARCINOMA, GRADE 1 TO 2  Results: IMMUNOHISTOCHEMICAL AND MORPHOMETRIC ANALYSIS PERFORMED MANUALLY Estrogen Receptor: 90%, POSITIVE, STRONG STAINING INTENSITY Progesterone Receptor: 40%, POSITIVE, STRONG STAINING INTENSITY Proliferation Marker Ki67: 5%  Results: HER2 - NEGATIVE RATIO OF HER2/CEP17 SIGNALS 1.17 AVERAGE HER2 COPY NUMBER PER CELL 2.45  Diagnosis 02/08/2016 Breast, lumpectomy, Right - INVASIVE AND IN SITU DUCTAL CARCINOMA, 1.1 CM. - MARGINS NOT INVOLVED. - FIBROCYSTIC CHANGES WITH CALCIFICATIONS. - LOBULAR NEOPLASIA (ATYPICAL LOBULAR HYPERPLASIA). - PREVIOUS BIOPSY SITE. Microscopic Comment BREAST, INVASIVE TUMOR, WITHOUT LYMPH NODES PRESENT Specimen, including laterality: Right breast. Procedure: Localized lumpectomy. Histologic type: Ductal Grade:1 Tubule formation: 1 Nuclear pleomorphism: 2 Mitotic: 1 Tumor size (gross measurement): 1.1 cm Margins: Free of tumor Invasive, distance to closest margin: 0.5 cm from the anterior margin In-situ, distance to closest margin: 0.7 cm from the anterior margin Lymphovascular invasion: Not identified Ductal carcinoma in situ: Present Grade: Low grade Extensive intraductal component: No Lobular neoplasia: Yes, atypical lobular hyperplasia Tumor focality: Unifocal Treatment effect: No If present, treatment effect in breast tissue, lymph nodes or both: N/A Extent of tumor: Skin: N/A Nipple: N/A 1 of 2 FINAL for Puglia, Lorrene E ([id number]) Microscopic Comment(continued) Skeletal muscle: N/A Breast prognostic profile: Case number [id number] Estrogen  receptor: 90%, positive, strong staining Progesterone receptor: 40%, positive, strong staining Her 2 neu: Negative, ratio 1.17 Ki-67: 5% Non-neoplastic breast: Fibrocystic changes with calcification TNM: pT1c, pNX, pMX Comments: There is atypical lobular hyperplasia, which is focally at the anterior margin. (JDP:ecj 02/09/2016) Claudette Laws MD Pathologist, Electronic Signature (Case signed 02/09/2016)   RADIOGRAPHIC STUDIES: I have personally reviewed the radiological images as listed and agreed with the findings in the report. Xr Hip Unilat W Or W/o Pelvis 1v Left  Result Date: 04/19/2016 An AP and lateral of the left hip shows a well-seated implant I see no acute injury.  Xr Lumbar Spine 2-3 Views  Result Date: 04/19/2016 An AP and lateral lumbar spine show some degenerative changes. I question whether or not there is a small compression fracture at L1 or L2 but this is not correlate with her clinical exam.   Her outside mammogram and ultrasound image in report were reviewed.  Diagnostic mammogram and ultrasound of right breast 01/05/2016 Breast composition Repeat. There is a new 6 mm irregular high density architecture distortion in the right breast 10:00 middle tabs. It is measured 9 mm meter by ultrasound. Right axillary was negative for adenopathy. The mass is highly suspicious for malignancy.  ASSESSMENT & PLAN: 82 year old Caucasian female, with screening discovered right breast cancer.  1. Breast cancer of upper-outer quadrant of right breast, invasive ductal carcinoma, PT1CN0M0, stage IA, ER+/PR+/HER2- -I previously reviewed her surgical pathology findings with patient in great details. -She has early-stage breast cancer, had a computed surgical resection. -Giving the low Ki-67 and agree 1 disease, I think this is likely a low risk cancer. I do not recommend adjuvant chemotherapy or Oncotype --she has anastrazole the end of September 2017, tolerating well overall,  her  arthralgia has gotten slightly worse, possible related to anastrozole, it is manageable, we'll continue for now. I have low threshold to switch her to tamoxifen or stop adjuvant antiestrogen therapy if her arthralgia is much worse, due to her advanced age. -We also reviewed breast cancer surveillance. She will continue screening mammogram every year, I encouraged her to do self exam, and follow-up with [place] regularly.  2. Genetics -Her daughter was diagnosed with breast cancer at age of 40. -She has strong family history of colon cancer, pancreatic cancer, etc. I'll refer her to genetic counseling to ruled out inch syndrome or other inheritable cancer syndrome.  3. HTN -She was on hydrochlorothiazide for hypertension before, which was held when she had a 2 fibrillation. She is on low-dose metoprolol now.  -Her blood pressure has been significantly elevated daily, I strongly encouraged her to follow-up with her primary care physician as soon as possible   4. Joint pain -Chronic left hip pain. Follows with orthopedics. Has had several replacements and falls.  -Last fall was about one month ago, with no fracture -Taking meloxicam -Joint pain has slightly increased with anastrozole  6. Bone health -She has not had a DEXA -I spoke with the patient about the risk for decreased strength in bones with anastrozole -Encouraged her to begin taking calcium and Vitamin D supplements -I will order her for DEXA in 1 month  Plan -She will continue anastrozole -I have ordered a DEXA in 1 month -We'll call her primary care physician for her uncontrolled hypertension -I'll see her back in 3 months with lab.   All questions were answered. The patient knows to call the clinic with any problems, questions or concerns. I spent 25 minutes counseling the patient face to face. The total time spent in the appointment was 30 minutes and more than 50% was on counseling.   This document serves as a record of  services personally performed by Truitt Merle, MD. It was created on her behalf by Arlyce Harman, a trained medical scribe. The creation of this record is based on the scribe's personal observations and the provider's statements to them. This document has been checked and approved by the attending provider.   Truitt Merle, MD 04/30/2016 10:51 AM

## 2016-04-30 ENCOUNTER — Other Ambulatory Visit (HOSPITAL_BASED_OUTPATIENT_CLINIC_OR_DEPARTMENT_OTHER): Payer: Medicare Other

## 2016-04-30 ENCOUNTER — Encounter: Payer: Self-pay | Admitting: Hematology

## 2016-04-30 ENCOUNTER — Ambulatory Visit (HOSPITAL_BASED_OUTPATIENT_CLINIC_OR_DEPARTMENT_OTHER): Payer: Medicare Other | Admitting: Hematology

## 2016-04-30 ENCOUNTER — Telehealth: Payer: Self-pay | Admitting: Internal Medicine

## 2016-04-30 VITALS — BP 171/71 | HR 59 | Temp 97.7°F | Resp 17 | Ht 64.0 in | Wt 159.0 lb

## 2016-04-30 DIAGNOSIS — Z170 Estrogen receptor positive status [ER+]: Secondary | ICD-10-CM | POA: Diagnosis not present

## 2016-04-30 DIAGNOSIS — Z79811 Long term (current) use of aromatase inhibitors: Secondary | ICD-10-CM | POA: Diagnosis not present

## 2016-04-30 DIAGNOSIS — Z780 Asymptomatic menopausal state: Secondary | ICD-10-CM

## 2016-04-30 DIAGNOSIS — I10 Essential (primary) hypertension: Secondary | ICD-10-CM | POA: Diagnosis not present

## 2016-04-30 DIAGNOSIS — C50411 Malignant neoplasm of upper-outer quadrant of right female breast: Secondary | ICD-10-CM

## 2016-04-30 LAB — COMPREHENSIVE METABOLIC PANEL
ALBUMIN: 3.7 g/dL (ref 3.5–5.0)
ALK PHOS: 200 U/L — AB (ref 40–150)
ALT: 15 U/L (ref 0–55)
ANION GAP: 8 meq/L (ref 3–11)
AST: 18 U/L (ref 5–34)
BILIRUBIN TOTAL: 0.53 mg/dL (ref 0.20–1.20)
BUN: 16.4 mg/dL (ref 7.0–26.0)
CALCIUM: 9.8 mg/dL (ref 8.4–10.4)
CHLORIDE: 106 meq/L (ref 98–109)
CO2: 28 mEq/L (ref 22–29)
CREATININE: 0.8 mg/dL (ref 0.6–1.1)
EGFR: 72 mL/min/{1.73_m2} — ABNORMAL LOW (ref >90)
Glucose: 95 mg/dl (ref 70–140)
Potassium: 4.5 mEq/L (ref 3.5–5.1)
Sodium: 142 mEq/L (ref 136–145)
TOTAL PROTEIN: 6.6 g/dL (ref 6.4–8.3)

## 2016-04-30 LAB — CBC WITH DIFFERENTIAL/PLATELET
BASO%: 0.2 % (ref 0.0–2.0)
Basophils Absolute: 0 10*3/uL (ref 0.0–0.1)
EOS%: 2 % (ref 0.0–7.0)
Eosinophils Absolute: 0.2 10*3/uL (ref 0.0–0.5)
HEMATOCRIT: 42.8 % (ref 34.8–46.6)
HGB: 14 g/dL (ref 11.6–15.9)
LYMPH#: 3 10*3/uL (ref 0.9–3.3)
LYMPH%: 34 % (ref 14.0–49.7)
MCH: 30.5 pg (ref 25.1–34.0)
MCHC: 32.7 g/dL (ref 31.5–36.0)
MCV: 93.2 fL (ref 79.5–101.0)
MONO#: 0.7 10*3/uL (ref 0.1–0.9)
MONO%: 8.3 % (ref 0.0–14.0)
NEUT#: 5 10*3/uL (ref 1.5–6.5)
NEUT%: 55.5 % (ref 38.4–76.8)
PLATELETS: 195 10*3/uL (ref 145–400)
RBC: 4.59 10*6/uL (ref 3.70–5.45)
RDW: 13.3 % (ref 11.2–14.5)
WBC: 8.9 10*3/uL (ref 3.9–10.3)

## 2016-04-30 NOTE — Telephone Encounter (Signed)
° ° °  Pt said her bp was up today when she went to see her cancer doctor 171-71  . Pt said her heart doctor took her off the bp medicine  Dr Raliegh Ip had her on. She said the heart doctor put her on  Metoprolol. She said she take a half pill in the morning and a half pill at night. Pt would like a call back

## 2016-04-30 NOTE — Telephone Encounter (Signed)
Ask patient to continue to monitor her blood pressure closely this week and to check back with [place] on Friday if blood pressure is consistently elevated

## 2016-04-30 NOTE — Telephone Encounter (Signed)
Left message on voicemail to call office.  

## 2016-04-30 NOTE — Telephone Encounter (Signed)
Please see message and advise 

## 2016-05-01 NOTE — Telephone Encounter (Signed)
Error

## 2016-05-01 NOTE — Telephone Encounter (Signed)
Left message on voicemail to call office.  

## 2016-05-02 ENCOUNTER — Telehealth: Payer: Self-pay | Admitting: Internal Medicine

## 2016-05-02 NOTE — Telephone Encounter (Signed)
Teresa Frey pt returning your call.

## 2016-05-02 NOTE — Telephone Encounter (Signed)
Spoke to pt, told her Dr. Raliegh Ip would like her to continue to monitor her blood pressure closely this week and to check back with [place] on Friday. Pt verbalized understanding.

## 2016-05-02 NOTE — Telephone Encounter (Signed)
See other message

## 2016-05-04 ENCOUNTER — Telehealth: Payer: Self-pay | Admitting: Internal Medicine

## 2016-05-04 MED ORDER — HYDROCHLOROTHIAZIDE 12.5 MG PO CAPS: 12.5000 mg | ORAL_CAPSULE | Freq: Every day | ORAL | 2 refills | Status: DC

## 2016-05-04 NOTE — Telephone Encounter (Signed)
Spoke to pt, told her Dr.K saw her blood pressure readings and he wants you to start taking HCTZ 12.5 mg one capsule daily. Rx sent to pharmacy. Pt verbalized understanding.

## 2016-05-04 NOTE — Telephone Encounter (Signed)
Add HCTZ 12 point 5 mg #90 one daily

## 2016-05-04 NOTE — Telephone Encounter (Signed)
Pt would like to give you her readings they are as following:   11/29 3:55 pm 153/71     7:30 pm  180/78  11/30  7:00 am  156/67   6:30 pm 160/68  12/1  6:30 am 159/69

## 2016-05-04 NOTE — Telephone Encounter (Signed)
Please see Bp readings and advise.

## 2016-05-11 ENCOUNTER — Telehealth: Payer: Self-pay | Admitting: Hematology

## 2016-05-11 NOTE — Telephone Encounter (Signed)
Lab and follow up appointment with Dr Burr Medico was scheduled per 04/30/16 los. Appointments confirmed with patient. 05/11/16

## 2016-06-09 ENCOUNTER — Other Ambulatory Visit: Payer: Self-pay | Admitting: Hematology

## 2016-06-09 DIAGNOSIS — Z170 Estrogen receptor positive status [ER+]: Principal | ICD-10-CM

## 2016-06-09 DIAGNOSIS — C50411 Malignant neoplasm of upper-outer quadrant of right female breast: Secondary | ICD-10-CM

## 2016-07-02 ENCOUNTER — Other Ambulatory Visit: Payer: Self-pay | Admitting: Internal Medicine

## 2016-07-05 ENCOUNTER — Telehealth: Payer: Self-pay | Admitting: Adult Health

## 2016-07-05 NOTE — Telephone Encounter (Signed)
The Survivorship Care Plan was mailed to Teresa Frey as she reported not being able to come in to the Survivorship Clinic for an in-person visit at this time. A letter was mailed to her outlining the purpose of the content of the care plan, as well as encouraging her to reach out to me with any questions or concerns.  .  Additional resources regarding healthy eating, recommendations for exercise, L[employer] program, and brochures for support services were also included in the mailed packet.  A shorter version of the care plan was also routed/faxed/mailed to Teresa Cowden, MD, the patient's PCP.  I will not be placing any follow-up appointments to the Survivorship Clinic for Teresa Frey, but I am happy to see her at any time in the future for any survivorship concerns that may arise. Thank you for allowing me to participate in her care!  Charlestine Massed, NP [place] [phone]

## 2016-07-31 NOTE — Progress Notes (Signed)
[place]  Telephone:([phone]) [phone] [phone]  Clinic follow up Note   Patient Care Team: Teresa Lor, MD as PCP - General Teresa Skates, MD as Consulting Physician (General Surgery) Teresa Merle, MD as Consulting Physician (Hematology) Teresa Pray, MD as Consulting Physician (Radiation Oncology) Teresa Headland, NP as Nurse Practitioner (Hematology and Oncology) 08/01/2016  CHIEF COMPLAINTS:  Follow up right breast cancer  Oncology History   Breast cancer of upper-outer quadrant of right female breast [facility])   Staging form: Breast, AJCC 7th Edition   - Clinical stage from 01/18/2016: Stage IA (T1b, N0, M0) - Unsigned   - Pathologic stage from 02/08/2016: Stage IA (T1c, N0, cM0) - Signed by Teresa Merle, MD on 02/27/2016      Breast cancer of upper-outer quadrant of right female breast ([place])   01/05/2016 Mammogram    Diagnostic mammogram and ultrasound showed a 9 mm mass in the upper outer quadrant of right breast, axillary nodes were negative.      01/09/2016 Initial Diagnosis    Breast cancer of upper-outer quadrant of right female breast ([place])      01/09/2016 Initial Biopsy    R breast 9:00 position biopsy showed invasive ductal carcinoma, G1-2      01/09/2016 Receptors her2    ER 90%+, PR 40%+, HER2-, Ki67 5%      02/08/2016 Surgery    Right breast lumpectomy, no SLN biopsy        02/08/2016 Pathology Results    Right breast lumpectomy showed invasive and in situ ductal carcinoma, 1.1 cm, margins were negative. Grade 1, no lymphovascular invasion. Atypical lobular hyperplasia.      02/21/2016 Genetic Testing    Genetic testing did not reveal a known deleterious mutation.  Genetic testing did identify a variant of uncertain significance (VUS) called "c.1659G>A (p.Met553Ile)" in one copy of the NBN gene.  Genes tested include: APC, ATM, AXIN2, BARD1, BMPR1A, BRCA1, BRCA2, BRIP1, CDH1, CDK4, CDKN2A, CHEK2, EPCAM, FANCC, MLH1, MSH2, MSH6, MUTYH, NBN,  PALB2, PMS2, POLD1, POLE, PTEN, RAD51C, RAD51D, SCG5/GREM1, SMAD4, STK11, TP53, VHL, and XRCC2.      02/28/2016 -  Anti-estrogen oral therapy    Anastrozole 1 mg daily        HISTORY OF PRESENTING ILLNESS:  Teresa Frey 83 y.o. female is here because of Her recently diagnosed right breast cancer. She is accompanied by her daughter Teresa Frey to our multidisciplinary clinic today.  This was discovered by screening mammogram. She never had abnormal screening mammogram or breast biopsy in the past. The diagnostic mammogram showed a 6 mm mass in the upper outer quadrant of right breast, it measured 9 mm by ultrasound. A history nodes were negative by ultrasound. She underwent ultrasound guided biopsy on 01/09/2016, which showed invasive ductal carcinoma, ER/PR positive, HER-2 negative.  She lives alone, lives with her husband, who had multiple cancer, on tube feeds, not active. She is his caregiver. She is very independent. She still works full-time for [employer]. She denies any symptoms. Her daughter Teresa Frey was diagnosed with breast cancer at age of 11. Her son died at age of 17 from alcohol. She helps to take of her grandchildren who is 55.   GYN HISTORY  Menarchal:13 LMP: late 56's  Contraceptive: a few years  HRT: no G2P2: her son died in 69's, she has a daughter   CURRENT THERAPY: anastrozole 63m daily started on 02/28/2016   INTERIM HISTORY: Teresa CBeedlereturns for follow-up. She started anastrazole in  September 2017. She reports that her husband passed recently, and she has been under a lot of stress recently from that. The patient reports she is doing well with Anastrozole. She denies hot flashes. She reports some slight, shooting pain to her incision site. She reports some mild arthritis in fingers.     MEDICAL HISTORY:  Past Medical History:  Diagnosis Date  . ANXIETY 04/07/2007  . Breast cancer of upper-outer quadrant of right female breast ([place]) 01/11/2016  . COLONIC POLYPS, HX OF  04/02/2007   adenomatous  . DIVERTICULITIS, HX OF 04/02/2007  . Diverticulosis   . DJD (degenerative joint disease)   . Hemorrhoids   . HYPERTENSION 04/02/2007  . OSTEOARTHRITIS, SEVERE 04/07/2007  . 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 WEIGHT GAIN 11/13/2007    SURGICAL HISTORY: Past Surgical History:  Procedure Laterality Date  . APPENDECTOMY    . BREAST LUMPECTOMY WITH RADIOACTIVE SEED LOCALIZATION Right 02/08/2016   Procedure: RIGHT BREAST LUMPECTOMY WITH RADIOACTIVE SEED LOCALIZATION;  Surgeon: Teresa Skates, MD;  Location: [place];  Service: General;  Laterality: Right;  . TONSILLECTOMY    . TOTAL HIP ARTHROPLASTY  1999, left hip    2006 right hip  . TUBAL LIGATION      SOCIAL HISTORY: Social History   Social History  . Marital status: Married    Spouse name: N/A  . Number of children: 2  . Years of education: N/A   Occupational History  . Not on file.   Social History Main Topics  . Smoking status: Former Smoker    Packs/day: 1.00    Years: 27.00    Types: Cigarettes    Quit date: 06/04/1978  . Smokeless tobacco: Never Used  . Alcohol use 4.2 oz/week    7 Glasses of wine per week     Comment: 1-2 glasses wine after dinner, but not necessarily each day  . Drug use: No  . Sexual activity: Not on file   Other Topics Concern  . Not on file   Social History Narrative  . No narrative on file    FAMILY HISTORY: Family History  Problem Relation Age of Onset  . Pancreatic cancer Mother 64    d. 27  . Cancer Father 4    hodgkin's lymphoma and leukemia; d. 53  . Colon cancer Brother     dx. late 42s; smoker  . Pancreatic cancer Brother 80    d. 31; smoker  . Lung cancer Brother 40    smoker  . Heart attack Brother     d. late 73s  . Colon cancer Paternal Aunt   . Breast cancer Daughter 72    negative genetic testing in 2016  . Skin cancer Daughter     non-melanoma type; +sun exposure  . Kidney failure Son 83    +EtOH  abuse resulting in liver, kidney, and pancreas failure  . Diabetes Maternal Uncle     d. later age  . Heart disease Neg Hx     family  . Esophageal cancer Neg Hx   . Rectal cancer Neg Hx   . Stomach cancer Neg Hx     ALLERGIES:  has No Known Allergies.  MEDICATIONS:  Current Outpatient Prescriptions  Medication Sig Dispense Refill  . anastrozole (ARIMIDEX) 1 MG tablet TAKE 1 TABLET BY MOUTH DAILY. 90 tablet 3  . Ascorbic Acid (VITAMIN C) 1000 MG tablet Take 1,000 mg by mouth daily.    Marland Kitchen  aspirin EC 81 MG EC tablet Take 1 tablet (81 mg total) by mouth daily. 30 tablet 0  . BIOTIN 5000 PO Take 1 tablet by mouth daily.    . Calcium Carb-Cholecalciferol (CALCIUM 1000 + D PO) Take 1 tablet by mouth daily.    . hydrochlorothiazide (MICROZIDE) 12.5 MG capsule TAKE 1 CAPSULE (12.5 MG TOTAL) BY MOUTH DAILY. 30 capsule 2  . meloxicam (MOBIC) 7.5 MG tablet Take 1 tablet (7.5 mg total) by mouth daily. 60 tablet 0  . metoprolol tartrate (LOPRESSOR) 25 MG tablet Take 0.5 tablets (12.5 mg total) by mouth 2 (two) times daily. 90 tablet 1  . Multiple Vitamins-Minerals (CENTRUM SILVER ADULT 50+) TABS Take 1 tablet by mouth daily.    Marland Kitchen omega-3 acid ethyl esters (LOVAZA) 1 g capsule Take by mouth 2 (two) times daily.    . sertraline (ZOLOFT) 50 MG tablet Take 1 tablet (50 mg total) by mouth daily. 90 tablet 3   No current facility-administered medications for this visit.     REVIEW OF SYSTEMS:   Constitutional: Denies fevers, chills, abnormal night sweats or hot flashes Eyes: Denies blurriness of vision, double vision or watery eyes Ears, nose, mouth, throat, and face: Denies mucositis or sore throat Respiratory: Denies cough, dyspnea or wheezes (+) SOB with exertion Cardiovascular: Denies palpitation, chest discomfort or lower extremity swelling Gastrointestinal:  Denies nausea, heartburn or change in bowel habits Skin: Denies abnormal skin rashes Lymphatics: Denies new lymphadenopathy or easy  bruising Musculoskeletal: (+) sharp pains in right breast, chronic left hip pain, mild ankle swelling, (+) mild arthritis in fingers Neurological:Denies numbness, tingling or new weaknesses Behavioral/Psych: Mood is stable, no new changes  All other systems were reviewed with the patient and are negative.  PHYSICAL EXAMINATION: ECOG PERFORMANCE STATUS: 0 - Asymptomatic  Vitals:   08/01/16 1043  BP: (!) 151/64  Pulse: (!) 57  Resp: 18  Temp: 97.7 F (36.5 C)   Filed Weights   08/01/16 1043  Weight: 157 lb (71.2 kg)    GENERAL:alert, no distress and comfortable SKIN: skin color, texture, turgor are normal, no rashes or significant lesions EYES: normal, conjunctiva are pink and non-injected, sclera clear OROPHARYNX:no exudate, no erythema and lips, buccal mucosa, and tongue normal  NECK: supple, thyroid normal size, non-tender, without nodularity LYMPH:  no palpable lymphadenopathy in the cervical, axillary or inguinal LUNGS: clear to auscultation and percussion with normal breathing effort HEART: regular rate & rhythm and no murmurs and no lower extremity edema ABDOMEN:abdomen soft, non-tender and normal bowel sounds Musculoskeletal:no cyanosis of digits and no clubbing  PSYCH: alert & oriented x 3 with fluent speech NEURO: no focal motor/sensory deficits Breasts: Very well healed scar with no scar tissue, no other adenopathy or mass appreciated.    LABORATORY DATA:  I have reviewed the data as listed. CBC Latest Ref Rng & Units 08/01/2016 04/30/2016 02/12/2016  WBC 3.9 - 10.3 10e3/uL 10.2 8.9 9.7  Hemoglobin 11.6 - 15.9 g/dL 14.8 14.0 14.0  Hematocrit 34.8 - 46.6 % 45.2 42.8 43.7  Platelets 145 - 400 10e3/uL 181 195 203   CMP Latest Ref Rng & Units 04/30/2016 02/13/2016 02/12/2016  Glucose 70 - 140 mg/dl 95 114(H) 101(H)  BUN 7.0 - 26.0 mg/dL 16.4 16 12   Creatinine 0.6 - 1.1 mg/dL 0.8 0.69 0.62  Sodium 136 - 145 mEq/L 142 141 142  Potassium 3.5 - 5.1 mEq/L 4.5 4.2 3.4(L)    Chloride 101 - 111 mmol/L - 108 108  CO2 22 -  29 mEq/L 28 26 27   Calcium 8.4 - 10.4 mg/dL 9.8 9.1 8.9  Total Protein 6.4 - 8.3 g/dL 6.6 - -  Total Bilirubin 0.20 - 1.20 mg/dL 0.53 - -  Alkaline Phos 40 - 150 U/L 200(H) - -  AST 5 - 34 U/L 18 - -  ALT 0 - 55 U/L 15 - -   PATHOLOGY REPORT  Diagnosis 01/09/2016 Breast, right, needle core biopsy, 9 o'clock 3 cm fn INVASIVE DUCTAL CARCINOMA, GRADE 1 TO 2  Diagnosis 02/08/2016 Breast, lumpectomy, Right - INVASIVE AND IN SITU DUCTAL CARCINOMA, 1.1 CM. - MARGINS NOT INVOLVED. - FIBROCYSTIC CHANGES WITH CALCIFICATIONS. - LOBULAR NEOPLASIA (ATYPICAL LOBULAR HYPERPLASIA). - PREVIOUS BIOPSY SITE.   RADIOGRAPHIC STUDIES: I have personally reviewed the radiological images as listed and agreed with the findings in the report. No results found.  Her outside mammogram and ultrasound image in report were reviewed.  Diagnostic mammogram and ultrasound of right breast 01/05/2016 Breast composition Repeat. There is a new 6 mm irregular high density architecture distortion in the right breast 10:00 middle tabs. It is measured 9 mm meter by ultrasound. Right axillary was negative for adenopathy. The mass is highly suspicious for malignancy.  ASSESSMENT & PLAN: 83 y.o. Caucasian female, with screening discovered right breast cancer.  1. Breast cancer of upper-outer quadrant of right breast, invasive ductal carcinoma, PT1CN0M0, stage IA, ER+/PR+/HER2- -I previously reviewed her surgical pathology findings with patient in great details. -She has early-stage breast cancer, had a computed surgical resection. -Giving the low Ki-67 and agree 1 disease, I think this is likely a low risk cancer. I do not recommend adjuvant chemotherapy or Oncotype --she has anastrazole the end of September 2017, tolerating well overall, she has developed mild arthralgia especially in her hands, overall stable, likely related to anastrozole, it is manageable, we'll continue for  now. I have low threshold to switch her to tamoxifen or stop adjuvant antiestrogen therapy if her arthralgia is much worse, due to her advanced age. -I encourage her to exercise, which will likely help her arthralgia. -We will continue Anastrozole for 5 years. -We also previously reviewed breast cancer surveillance. She will continue screening mammogram every year, I encouraged her to do self exam, and follow-up with [place] regularly. -The patient is due for routine yearly mammogram in August.  2. Genetics -Her daughter was diagnosed with breast cancer at age of 70. -She has strong family history of colon cancer, pancreatic cancer, etc. I'll refer her to genetic counseling to ruled out inch syndrome or other inheritable cancer syndrome. -Her genetic testing was negative  3. HTN -She was on hydrochlorothiazide for hypertension before, which was held when she had a 2 fibrillation. She is on low-dose metoprolol now.  -Her blood pressure has been significantly elevated daily, I strongly encouraged her to follow-up with her primary care physician as soon as possible  -Blood pressure today is 171/71. -CMP today pending.  4. Arthralgia -Chronic left hip pain. Follows with orthopedics. Has had several replacements and falls.  -Last fall was several months ago, with no fracture -Taking meloxicam -Joint pain has slightly increased with anastrozole -I encouraged the patient to stay active as abel to prevent joint pain. She will monitor her discomfort and call me if anything changes significantly.  6. Bone health -She has not had a DEXA -I previously spoke with the patient about the risk for decreased strength in bones with anastrozole -Encouraged her to begin taking calcium and Vitamin D supplements -I will order  her for DEXA in August with mammogram  Plan -CBC reviewed. CMP pending. Results normal. -She is tolerating Anastrozole well and will continue. -The patient will have a bone density scan at  the same time as her mammogram in August. -I will see her back in 4 months with labs.  All questions were answered. The patient knows to call the clinic with any problems, questions or concerns. I spent 15 minutes counseling the patient face to face. The total time spent in the appointment was 20 minutes and more than 50% was on counseling.  This document serves as a record of services personally performed by Teresa Merle, MD. It was created on her behalf by Maryla Morrow, a trained medical scribe. The creation of this record is based on the scribe's personal observations and the provider's statements to them. This document has been checked and approved by the attending provider.   Teresa Merle, MD 08/01/2016 11:05 AM

## 2016-08-01 ENCOUNTER — Encounter: Payer: Self-pay | Admitting: Hematology

## 2016-08-01 ENCOUNTER — Telehealth: Payer: Self-pay | Admitting: Hematology

## 2016-08-01 ENCOUNTER — Ambulatory Visit (HOSPITAL_BASED_OUTPATIENT_CLINIC_OR_DEPARTMENT_OTHER): Payer: Medicare Other | Admitting: Hematology

## 2016-08-01 ENCOUNTER — Other Ambulatory Visit (HOSPITAL_BASED_OUTPATIENT_CLINIC_OR_DEPARTMENT_OTHER): Payer: Medicare Other

## 2016-08-01 VITALS — BP 151/64 | HR 57 | Temp 97.7°F | Resp 18 | Ht 64.0 in | Wt 157.0 lb

## 2016-08-01 DIAGNOSIS — Z170 Estrogen receptor positive status [ER+]: Secondary | ICD-10-CM

## 2016-08-01 DIAGNOSIS — I10 Essential (primary) hypertension: Secondary | ICD-10-CM | POA: Diagnosis not present

## 2016-08-01 DIAGNOSIS — C50411 Malignant neoplasm of upper-outer quadrant of right female breast: Secondary | ICD-10-CM

## 2016-08-01 DIAGNOSIS — Z79811 Long term (current) use of aromatase inhibitors: Secondary | ICD-10-CM | POA: Diagnosis not present

## 2016-08-01 DIAGNOSIS — Z780 Asymptomatic menopausal state: Secondary | ICD-10-CM

## 2016-08-01 LAB — CBC WITH DIFFERENTIAL/PLATELET
BASO%: 0.1 % (ref 0.0–2.0)
BASOS ABS: 0 10*3/uL (ref 0.0–0.1)
EOS ABS: 0.2 10*3/uL (ref 0.0–0.5)
EOS%: 1.7 % (ref 0.0–7.0)
HCT: 45.2 % (ref 34.8–46.6)
HGB: 14.8 g/dL (ref 11.6–15.9)
LYMPH%: 29.3 % (ref 14.0–49.7)
MCH: 30.2 pg (ref 25.1–34.0)
MCHC: 32.7 g/dL (ref 31.5–36.0)
MCV: 92.2 fL (ref 79.5–101.0)
MONO#: 0.9 10*3/uL (ref 0.1–0.9)
MONO%: 8.4 % (ref 0.0–14.0)
NEUT#: 6.2 10*3/uL (ref 1.5–6.5)
NEUT%: 60.5 % (ref 38.4–76.8)
PLATELETS: 181 10*3/uL (ref 145–400)
RBC: 4.9 10*6/uL (ref 3.70–5.45)
RDW: 13.6 % (ref 11.2–14.5)
WBC: 10.2 10*3/uL (ref 3.9–10.3)
lymph#: 3 10*3/uL (ref 0.9–3.3)

## 2016-08-01 LAB — COMPREHENSIVE METABOLIC PANEL
ALBUMIN: 4 g/dL (ref 3.5–5.0)
ALK PHOS: 200 U/L — AB (ref 40–150)
ALT: 17 U/L (ref 0–55)
AST: 18 U/L (ref 5–34)
Anion Gap: 9 mEq/L (ref 3–11)
BUN: 18.5 mg/dL (ref 7.0–26.0)
CO2: 29 meq/L (ref 22–29)
Calcium: 10.5 mg/dL — ABNORMAL HIGH (ref 8.4–10.4)
Chloride: 105 mEq/L (ref 98–109)
Creatinine: 0.8 mg/dL (ref 0.6–1.1)
EGFR: 72 mL/min/{1.73_m2} — AB (ref >90)
GLUCOSE: 101 mg/dL (ref 70–140)
POTASSIUM: 4.6 meq/L (ref 3.5–5.1)
SODIUM: 143 meq/L (ref 136–145)
Total Bilirubin: 0.48 mg/dL (ref 0.20–1.20)
Total Protein: 7.1 g/dL (ref 6.4–8.3)

## 2016-08-01 NOTE — Telephone Encounter (Signed)
Appointments scheduled per 08/01/16 los. Patient was given a copy of the AVS report and appointment schedule per 08/01/16 los. °

## 2016-09-04 ENCOUNTER — Encounter (HOSPITAL_COMMUNITY): Payer: Self-pay

## 2016-10-08 ENCOUNTER — Telehealth: Payer: Self-pay | Admitting: *Deleted

## 2016-10-08 NOTE — Telephone Encounter (Signed)
Pt called and left message requesting a call back from nurse about side effects of medications.   Called pt back without answers.  Left message on voice mail asking pt to call nurse back for further details. Pt's   Phone     [phone].

## 2016-10-09 ENCOUNTER — Encounter: Payer: Medicare Other | Admitting: Internal Medicine

## 2016-10-10 DIAGNOSIS — L309 Dermatitis, unspecified: Secondary | ICD-10-CM | POA: Diagnosis not present

## 2016-11-06 ENCOUNTER — Ambulatory Visit: Payer: Medicare Other | Admitting: Internal Medicine

## 2016-11-14 DIAGNOSIS — H02052 Trichiasis without entropian right lower eyelid: Secondary | ICD-10-CM | POA: Diagnosis not present

## 2016-11-14 DIAGNOSIS — H02055 Trichiasis without entropian left lower eyelid: Secondary | ICD-10-CM | POA: Diagnosis not present

## 2016-11-14 DIAGNOSIS — H04123 Dry eye syndrome of bilateral lacrimal glands: Secondary | ICD-10-CM | POA: Diagnosis not present

## 2016-11-14 DIAGNOSIS — H5203 Hypermetropia, bilateral: Secondary | ICD-10-CM | POA: Diagnosis not present

## 2016-11-20 ENCOUNTER — Encounter: Payer: Self-pay | Admitting: Internal Medicine

## 2016-11-20 ENCOUNTER — Ambulatory Visit (INDEPENDENT_AMBULATORY_CARE_PROVIDER_SITE_OTHER): Payer: Medicare Other | Admitting: Internal Medicine

## 2016-11-20 VITALS — BP 140/78 | HR 65 | Temp 98.2°F | Ht 63.5 in | Wt 160.2 lb

## 2016-11-20 DIAGNOSIS — C50411 Malignant neoplasm of upper-outer quadrant of right female breast: Secondary | ICD-10-CM

## 2016-11-20 DIAGNOSIS — I4891 Unspecified atrial fibrillation: Secondary | ICD-10-CM

## 2016-11-20 DIAGNOSIS — Z0000 Encounter for general adult medical examination without abnormal findings: Secondary | ICD-10-CM | POA: Diagnosis not present

## 2016-11-20 DIAGNOSIS — I10 Essential (primary) hypertension: Secondary | ICD-10-CM

## 2016-11-20 DIAGNOSIS — Z170 Estrogen receptor positive status [ER+]: Secondary | ICD-10-CM

## 2016-11-20 NOTE — Progress Notes (Signed)
   Subjective:    Patient ID: AKERA SNOWBERGER, female    DOB: [birth year], 83 y.o.   MRN: [id number]  HPI    Review of Systems  Constitutional: Negative.   HENT: Negative for congestion, dental problem, hearing loss, rhinorrhea, sinus pressure, sore throat and tinnitus.   Eyes: Negative for pain, discharge and visual disturbance.  Respiratory: Negative for cough and shortness of breath.   Cardiovascular: Negative for chest pain, palpitations and leg swelling.  Gastrointestinal: Negative for abdominal distention, abdominal pain, blood in stool, constipation, diarrhea, nausea and vomiting.  Genitourinary: Negative for difficulty urinating, dysuria, flank pain, frequency, hematuria, pelvic pain, urgency, vaginal bleeding, vaginal discharge and vaginal pain.  Musculoskeletal: Negative for arthralgias, gait problem and joint swelling.  Skin: Negative for rash.  Neurological: Negative for dizziness, syncope, speech difficulty, weakness, numbness and headaches.  Hematological: Negative for adenopathy.  Psychiatric/Behavioral: Negative for agitation, behavioral problems and dysphoric mood. The patient is not nervous/anxious.        Objective:   Physical Exam  Constitutional: She is oriented to person, place, and time. She appears well-developed and well-nourished.  HENT:  Head: Normocephalic and atraumatic.  Right Ear: External ear normal.  Left Ear: External ear normal.  Mouth/Throat: Oropharynx is clear and moist.  Eyes: Conjunctivae and EOM are normal.  Neck: Normal range of motion. Neck supple. No JVD present. No thyromegaly present.  Cardiovascular: Normal rate, regular rhythm, normal heart sounds and intact distal pulses.   No murmur heard. Pulses regular Posterior tibial pulses full.  Dorsalis pedis pulses not easily palpable  Pulmonary/Chest: Effort normal and breath sounds normal. She has no wheezes. She has no rales.  Right breast lumpectomy scar well-healed No axillary  adenopathy  Abdominal: Soft. Bowel sounds are normal. She exhibits no distension and no mass. There is no tenderness. There is no rebound and no guarding.  Genitourinary: Vagina normal.  Musculoskeletal: Normal range of motion. She exhibits no edema or tenderness.  Neurological: She is alert and oriented to person, place, and time. She has normal reflexes. No cranial nerve deficit. She exhibits normal muscle tone. Coordination normal.  Skin: Skin is warm and dry. No rash noted.  Psychiatric: She has a normal mood and affect. Her behavior is normal.          Assessment & Plan:   Preventive health examination Subsequent Medicare wellness visit Essential hypertension, stable Paroxysmal atrial fibrillation.  Remains stable History depression, stable Right breast cancer.  Status post lumpectomy.  Follow-up oncology Osteoarthritis History of impaired glucose tolerance  We'll check screening lab Follow-up 6 months  KWIATKOWSKI,PETER Pilar Plate

## 2016-11-20 NOTE — Progress Notes (Signed)
Patient ID: Teresa Frey, female   DOB: 14-Sep-1929, 83 y.o.   MRN: [id number]  83 year old patient who is seen today for a preventive health examination and subsequent Medicare wellness visit. In 2017, she was hospitalized briefly for paroxysmal atrial fibrillation.  She converted spontaneously in the ED and has been followed on aspirin therapy alone.  She will be considered for full anticoagulation if atrial fibrillation recurs.  She was diagnosed with right breast cancer last year and is followed closely by oncology.  Allergies (verified):  No Known Drug Allergies   Past History:  Past Medical History:   DJD  Colonic polyps, hx of  Diverticulitis, hx of  Hypertension  Anxiety  Paget's disease, lumbar spine and sacrum  Right breast cancer History of paroxysmal atrial fibrillation.  2017  Past Surgical History:   Appendectomy  Total hip replacement left 1989, with revision in 1999; status post right total hip replacement surgery in March 2006  Tubal ligation  Tonsillectomy  colonoscopy May 2007, May 2010  June 2013 July 2016  Family History:   Family History of Colon CA 1st degree relative <60  Family History Lung cancer  Family History of Cardiovascular disorder  Family History of Leukemia  Family History of Hodgkins  father died of Hodgkin's diseasedisease and leukemia at age 72  mother died age 62, pancreatic cancer  Three brothers, one deceased of colon cancer. Another from lung cancer  third brother died with pancreatic cancer   Social History:   Occupation: still works full time for a [employer] widow , spouse history of Alzheimer's died earlier this year  Medicare wellness visit  1. Risk factors, based on past  M,S,F history.  Cardio vascular risk factors include a history of hypertension.  She has a history of paroxysmal atrial fibrillation, treated with daily aspirin  2.  Physical activities:remains active.  Still works full-time walks  and does yard work.  No activity restrictions  3.  Depression/mood:history mild depression.  Remains on sertraline, which she feels has been quite helpful  4.  Hearing:no deficits  5.  ADL's:independent  6.  Fall risk:low  7.  Home safety:no problems identified  8.  Height weight, and visual acuity;height and weight stable.  Recent I visit within the last week  9.  Counseling:continue heart healthy diet and regular exercise  10. Lab orders based on risk factors:laboratory update reviewed  11. Referral :follow-up oncology  12. Care plan:continue efforts at aggressive risk factor modification  13. Cognitive assessment: alert and appropriate with normal affect no cognitive dysfunction  14. Screening: Patient provided with a written and personalized 5-10 year screening schedule in the AVS.    15. Provider List Update: primary care radiology oncology

## 2016-11-20 NOTE — Patient Instructions (Addendum)
WE NOW OFFER   [facility] FAST TRACK!!!  SAME DAY Appointments for ACUTE CARE  Such as: Sprains, Injuries, cuts, abrasions, rashes, muscle pain, joint pain, back pain Colds, flu, sore throats, headache, allergies, cough, fever  Ear pain, sinus and eye infections Abdominal pain, nausea, vomiting, diarrhea, upset stomach Animal/insect bites  3 Easy Ways to Schedule: Walk-In Scheduling Call in scheduling Mychart Sign-up: [email or web address]    Limit your sodium (Salt) intake    It is important that you exercise regularly, at least 20 minutes 3 to 4 times per week.  If you develop chest pain or shortness of breath seek  medical attention.  Return in 6 months for follow-up

## 2016-11-30 ENCOUNTER — Other Ambulatory Visit: Payer: Self-pay | Admitting: Internal Medicine

## 2016-11-30 ENCOUNTER — Telehealth: Payer: Self-pay | Admitting: Hematology

## 2016-11-30 NOTE — Telephone Encounter (Signed)
Appointmenst moved from 12/03/16 to next available date of 12/21/16, per Dr Burr Medico. Appointments scheduled and confirmed with patient.

## 2016-12-03 ENCOUNTER — Ambulatory Visit: Payer: Medicare Other | Admitting: Hematology

## 2016-12-03 ENCOUNTER — Other Ambulatory Visit: Payer: Medicare Other

## 2016-12-10 DIAGNOSIS — L309 Dermatitis, unspecified: Secondary | ICD-10-CM | POA: Diagnosis not present

## 2016-12-10 DIAGNOSIS — L821 Other seborrheic keratosis: Secondary | ICD-10-CM | POA: Diagnosis not present

## 2016-12-20 DIAGNOSIS — R928 Other abnormal and inconclusive findings on diagnostic imaging of breast: Secondary | ICD-10-CM | POA: Diagnosis not present

## 2016-12-20 DIAGNOSIS — Z853 Personal history of malignant neoplasm of breast: Secondary | ICD-10-CM | POA: Diagnosis not present

## 2016-12-20 LAB — HM MAMMOGRAPHY

## 2016-12-20 NOTE — Progress Notes (Signed)
[place]  [phone] [phone]  Clinic follow up Note   Patient Care Team: Marletta Lor, MD as PCP - Frederich Chick, MD as Consulting Physician (General Surgery) Truitt Merle, MD as Consulting Physician (Hematology) Gery Pray, MD as Consulting Physician (Radiation Oncology) Gardenia Phlegm, NP as Nurse Practitioner (Hematology and Oncology) 12/21/2016  CHIEF COMPLAINTS:  Follow up right breast cancer  Oncology History   Breast cancer of upper-outer quadrant of right female breast [facility])   Staging form: Breast, AJCC 7th Edition   - Clinical stage from 01/18/2016: Stage IA (T1b, N0, M0) - Unsigned   - Pathologic stage from 02/08/2016: Stage IA (T1c, N0, cM0) - Signed by Truitt Merle, MD on 02/27/2016      Breast cancer of upper-outer quadrant of right female breast ([place])   01/05/2016 Mammogram    Diagnostic mammogram and ultrasound showed a 9 mm mass in the upper outer quadrant of right breast, axillary nodes were negative.      01/09/2016 Initial Diagnosis    Breast cancer of upper-outer quadrant of right female breast ([place])      01/09/2016 Initial Biopsy    R breast 9:00 position biopsy showed invasive ductal carcinoma, G1-2      01/09/2016 Receptors her2    ER 90%+, PR 40%+, HER2-, Ki67 5%      02/08/2016 Surgery    Right breast lumpectomy, no SLN biopsy        02/08/2016 Pathology Results    Right breast lumpectomy showed invasive and in situ ductal carcinoma, 1.1 cm, margins were negative. Grade 1, no lymphovascular invasion. Atypical lobular hyperplasia.      02/21/2016 Genetic Testing    Genetic testing did not reveal a known deleterious mutation.  Genetic testing did identify a variant of uncertain significance (VUS) called "c.1659G>A (p.Met553Ile)" in one copy of the NBN gene.  Genes tested include: APC, ATM, AXIN2, BARD1, BMPR1A, BRCA1, BRCA2, BRIP1, CDH1, CDK4, CDKN2A, CHEK2, EPCAM, FANCC, MLH1, MSH2, MSH6,  MUTYH, NBN, PALB2, PMS2, POLD1, POLE, PTEN, RAD51C, RAD51D, SCG5/GREM1, SMAD4, STK11, TP53, VHL, and XRCC2.      02/28/2016 -  Anti-estrogen oral therapy    Anastrozole 1 mg daily        HISTORY OF PRESENTING ILLNESS:  Teresa Frey 83 y.o. female is here because of Her recently diagnosed right breast cancer. She is accompanied by her daughter Teresa Frey to our multidisciplinary clinic today.  This was discovered by screening mammogram. She never had abnormal screening mammogram or breast biopsy in the past. The diagnostic mammogram showed a 6 mm mass in the upper outer quadrant of right breast, it measured 9 mm by ultrasound. A history nodes were negative by ultrasound. She underwent ultrasound guided biopsy on 01/09/2016, which showed invasive ductal carcinoma, ER/PR positive, HER-2 negative.  She lives alone, lives with her husband, who had multiple cancer, on tube feeds, not active. She is his caregiver. She is very independent. She still works full-time for [employer]. She denies any symptoms. Her daughter Teresa Frey was diagnosed with breast cancer at age of 33. Her son died at age of 87 from alcohol. She helps to take of her grandchildren who is 56.  GYN HISTORY  Menarchal:13 LMP: late 63's  Contraceptive: a few years  HRT: no G2P2: her son died in 17's, she has a daughter   CURRENT THERAPY: anastrozole 28m daily started on 02/28/2016   INTERIM HISTORY: [name] for follow-up. She reports she is doing  well. She denies any issues with anastrozole. She reports so stiffness to her fingers, but it is not stopping her from completing daily tasks. She does daily hand exercises to combat stiffness. She denies problems with her knees. She does not use a cane or walker to ambulate.      MEDICAL HISTORY:  Past Medical History:  Diagnosis Date  . ANXIETY 04/07/2007  . Breast cancer of upper-outer quadrant of right female breast ([place]) 01/11/2016  . COLONIC POLYPS, HX OF 04/02/2007    adenomatous  . DIVERTICULITIS, HX OF 04/02/2007  . Diverticulosis   . DJD (degenerative joint disease)   . Hemorrhoids   . HYPERTENSION 04/02/2007  . OSTEOARTHRITIS, SEVERE 04/07/2007  . 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 WEIGHT GAIN 11/13/2007    SURGICAL HISTORY: Past Surgical History:  Procedure Laterality Date  . APPENDECTOMY    . BREAST LUMPECTOMY WITH RADIOACTIVE SEED LOCALIZATION Right 02/08/2016   Procedure: RIGHT BREAST LUMPECTOMY WITH RADIOACTIVE SEED LOCALIZATION;  Surgeon: Fanny Skates, MD;  Location: [place];  Service: General;  Laterality: Right;  . TONSILLECTOMY    . TOTAL HIP ARTHROPLASTY  1999, left hip    2006 right hip  . TUBAL LIGATION      SOCIAL HISTORY: Social History   Social History  . Marital status: Married    Spouse name: N/A  . Number of children: 2  . Years of education: N/A   Occupational History  . Not on file.   Social History Main Topics  . Smoking status: Former Smoker    Packs/day: 1.00    Years: 27.00    Types: Cigarettes    Quit date: 06/04/1978  . Smokeless tobacco: Never Used  . Alcohol use 4.2 oz/week    7 Glasses of wine per week     Comment: 1-2 glasses wine after dinner, but not necessarily each day  . Drug use: No  . Sexual activity: Not on file   Other Topics Concern  . Not on file   Social History Narrative  . No narrative on file    FAMILY HISTORY: Family History  Problem Relation Age of Onset  . Pancreatic cancer Mother 30       d. 28  . Cancer Father 51       hodgkin's lymphoma and leukemia; d. 67  . Colon cancer Brother        dx. late 4s; smoker  . Pancreatic cancer Brother 80       d. 42; smoker  . Lung cancer Brother 40       smoker  . Heart attack Brother        d. late 6s  . Colon cancer Paternal Aunt   . Breast cancer Daughter 25       negative genetic testing in 2016  . Skin cancer Daughter        non-melanoma type; +sun exposure  . Kidney failure Son 40         +EtOH abuse resulting in liver, kidney, and pancreas failure  . Diabetes Maternal Uncle        d. later age  . Heart disease Neg Hx        family  . Esophageal cancer Neg Hx   . Rectal cancer Neg Hx   . Stomach cancer Neg Hx     ALLERGIES:  has No Known Allergies.  MEDICATIONS:  Current Outpatient Prescriptions  Medication Sig Dispense Refill  .  anastrozole (ARIMIDEX) 1 MG tablet TAKE 1 TABLET BY MOUTH DAILY. 90 tablet 3  . Ascorbic Acid (VITAMIN C) 1000 MG tablet Take 1,000 mg by mouth daily.    Marland Kitchen aspirin EC 81 MG EC tablet Take 1 tablet (81 mg total) by mouth daily. 30 tablet 0  . BIOTIN 5000 PO Take 1 tablet by mouth daily.    . Calcium Carb-Cholecalciferol (CALCIUM 1000 + D PO) Take 1 tablet by mouth daily.    . clobetasol (TEMOVATE) 0.05 % external solution   0  . hydrochlorothiazide (MICROZIDE) 12.5 MG capsule TAKE 1 CAPSULE (12.5 MG TOTAL) BY MOUTH DAILY. 30 capsule 3  . ketoconazole (NIZORAL) 2 % shampoo   2  . meloxicam (MOBIC) 7.5 MG tablet Take 1 tablet (7.5 mg total) by mouth daily. 60 tablet 0  . metoprolol tartrate (LOPRESSOR) 25 MG tablet Take 0.5 tablets (12.5 mg total) by mouth 2 (two) times daily. 90 tablet 1  . Multiple Vitamins-Minerals (CENTRUM SILVER ADULT 50+) TABS Take 1 tablet by mouth daily.    Marland Kitchen omega-3 acid ethyl esters (LOVAZA) 1 g capsule Take by mouth 2 (two) times daily.    . sertraline (ZOLOFT) 50 MG tablet TAKE 1 TABLET BY MOUTH DAILY. 90 tablet 3   No current facility-administered medications for this visit.     REVIEW OF SYSTEMS:   Constitutional: Denies fevers, chills, abnormal night sweats or hot flashes Eyes: Denies blurriness of vision, double vision or watery eyes Ears, nose, mouth, throat, and face: Denies mucositis or sore throat Respiratory: Denies cough, dyspnea or wheezes (+) SOB with exertion Cardiovascular: Denies palpitation, chest discomfort or lower extremity swelling Gastrointestinal:  Denies nausea, heartburn or change  in bowel habits Skin: Denies abnormal skin rashes Lymphatics: Denies new lymphadenopathy or easy bruising Musculoskeletal: (+) sharp pains in right breast, chronic left hip pain, mild ankle swelling, (+) mild arthritis in fingers Neurological:Denies numbness, tingling or new weaknesses Behavioral/Psych: Mood is stable, no new changes  All other systems were reviewed with the patient and are negative.  PHYSICAL EXAMINATION: ECOG PERFORMANCE STATUS: 0 - Asymptomatic  Vitals:   12/21/16 1303  BP: (!) 169/68  Pulse: 63  Resp: 18  Temp: (!) 97 F (36.1 C)   Filed Weights   12/21/16 1303  Weight: 160 lb 12.8 oz (72.9 kg)    GENERAL:alert, no distress and comfortable SKIN: skin color, texture, turgor are normal, no rashes or significant lesions EYES: normal, conjunctiva are pink and non-injected, sclera clear OROPHARYNX:no exudate, no erythema and lips, buccal mucosa, and tongue normal  NECK: supple, thyroid normal size, non-tender, without nodularity LYMPH:  no palpable lymphadenopathy in the cervical, axillary or inguinal LUNGS: clear to auscultation and percussion with normal breathing effort HEART: regular rate & rhythm and no murmurs and no lower extremity edema ABDOMEN:abdomen soft, non-tender and normal bowel sounds Musculoskeletal:no cyanosis of digits and no clubbing  PSYCH: alert & oriented x 3 with fluent speech NEURO: no focal motor/sensory deficits Breasts: Very well healed scar with no scar tissue, no other adenopathy or mass appreciated.     LABORATORY DATA:  I have reviewed the data as listed. CBC Latest Ref Rng & Units 12/21/2016 08/01/2016 04/30/2016  WBC 3.9 - 10.3 10e3/uL 7.6 10.2 8.9  Hemoglobin 11.6 - 15.9 g/dL 14.2 14.8 14.0  Hematocrit 34.8 - 46.6 % 43.8 45.2 42.8  Platelets 145 - 400 10e3/uL 189 181 195   CMP Latest Ref Rng & Units 12/21/2016 08/01/2016 04/30/2016  Glucose 70 - 140  mg/dl 95 101 95  BUN 7.0 - 26.0 mg/dL 15.4 18.5 16.4  Creatinine 0.6 -  1.1 mg/dL 0.7 0.8 0.8  Sodium 136 - 145 mEq/L 140 143 142  Potassium 3.5 - 5.1 mEq/L 4.1 4.6 4.5  Chloride 101 - 111 mmol/L - - -  CO2 22 - 29 mEq/L 27 29 28   Calcium 8.4 - 10.4 mg/dL 9.7 10.5(H) 9.8  Total Protein 6.4 - 8.3 g/dL 6.9 7.1 6.6  Total Bilirubin 0.20 - 1.20 mg/dL 0.45 0.48 0.53  Alkaline Phos 40 - 150 U/L 199(H) 200(H) 200(H)  AST 5 - 34 U/L 16 18 18   ALT 0 - 55 U/L 15 17 15    PATHOLOGY REPORT  Diagnosis 01/09/2016 Breast, right, needle core biopsy, 9 o'clock 3 cm fn INVASIVE DUCTAL CARCINOMA, GRADE 1 TO 2  Diagnosis 02/08/2016 Breast, lumpectomy, Right - INVASIVE AND IN SITU DUCTAL CARCINOMA, 1.1 CM. - MARGINS NOT INVOLVED. - FIBROCYSTIC CHANGES WITH CALCIFICATIONS. - LOBULAR NEOPLASIA (ATYPICAL LOBULAR HYPERPLASIA). - PREVIOUS BIOPSY SITE.   RADIOGRAPHIC STUDIES: I have personally reviewed the radiological images as listed and agreed with the findings in the report. No results found.  Her outside mammogram and ultrasound image in report were reviewed.  Mammogram 12/20/16 at [facility] Patient reports mammogram was clear; I do not have this report yet (12/21/16).  Diagnostic mammogram and ultrasound of right breast 01/05/2016 Breast composition Repeat. There is a new 6 mm irregular high density architecture distortion in the right breast 10:00 middle tabs. It is measured 9 mm meter by ultrasound. Right axillary was negative for adenopathy. The mass is highly suspicious for malignancy.  ASSESSMENT & PLAN: 83 y.o. Caucasian female, with screening discovered right breast cancer.  1. Breast cancer of upper-outer quadrant of right breast, invasive ductal carcinoma, pT1CN0M0, stage IA, ER+/PR+/HER2- -I previously reviewed her surgical pathology findings with patient in great details. -She has early-stage breast cancer, had a complete surgical resection. -Giving the low Ki-67 and agree 1 disease, I think this is likely a low risk cancer. I do not recommend adjuvant  chemotherapy or Oncotype --she has started adjuvant anastrazole the end of September 2017, tolerating well overall, she has developed mild arthralgia especially in her hands, overall stable, likely related to anastrozole, it is manageable, we'll continue for now. I have low threshold to switch her to tamoxifen or stop adjuvant antiestrogen therapy if her arthralgia is much worse, due to her advanced age. -I encourage her to exercise, which will likely help her arthralgia. -We will continue Anastrozole for 5 years total. -We also previously reviewed breast cancer surveillance. She will continue screening mammogram every year, I encouraged her to do self exam, and follow-up with [place] regularly. -The patient had mammogram yesterday, 12/20/16. I have not received these results from [facility] yet.  2. Genetics -Her daughter was diagnosed with breast cancer at age of 88. -She has strong family history of colon cancer, pancreatic cancer, etc. I'll refer her to genetic counseling to ruled out inch syndrome or other inheritable cancer syndrome. -Her genetic testing was negative  3. HTN -She was on hydrochlorothiazide for hypertension before, which was held when she had a 2 fibrillation. She is on low-dose metoprolol now.  -Her blood pressure has been significantly elevated daily, I strongly encouraged her to follow-up with her primary care physician as soon as possible  -Blood pressure today is 169/68.  4. Arthralgia -Chronic left hip pain. Follows with orthopedics. Has had several replacements and falls.  -Last fall was several months  ago, with no fracture -Taking meloxicam -Joint pain has slightly increased with anastrozole -I have encouraged the patient to stay active as able to prevent joint pain. She will monitor her discomfort and call me if anything changes significantly.  6. Bone health -I previously spoke with the patient about the risk for decreased strength in bones with anastrozole -Encouraged  her to begin taking calcium and Vitamin D supplements - I ordered DEXA with mammogram that took place yesterday, 12/20/16. Patient does not think she had this; I will contact [facility] for records. - If patient has not had DEXA, I will order another.  Plan - Labs reviewed, results normal. - Patient reports mammogram yesterday was normal, I will obtain these results from [facility].  - Order DEXA scan. - Continue Anastrozole. - Follow up in 4 months with labs.   All questions were answered. The patient knows to call the clinic with any problems, questions or concerns. I spent 15 minutes counseling the patient face to face. The total time spent in the appointment was 20 minutes and more than 50% was on counseling.  This document serves as a record of services personally performed by Truitt Merle, MD. It was created on her behalf by Maryla Morrow, a trained medical scribe. The creation of this record is based on the scribe's personal observations and the provider's statements to them. This document has been checked and approved by the attending provider.   Truitt Merle, MD 12/21/2016

## 2016-12-21 ENCOUNTER — Ambulatory Visit (HOSPITAL_BASED_OUTPATIENT_CLINIC_OR_DEPARTMENT_OTHER): Payer: Medicare Other | Admitting: Hematology

## 2016-12-21 ENCOUNTER — Telehealth: Payer: Self-pay | Admitting: Hematology

## 2016-12-21 ENCOUNTER — Other Ambulatory Visit (HOSPITAL_BASED_OUTPATIENT_CLINIC_OR_DEPARTMENT_OTHER): Payer: Medicare Other

## 2016-12-21 VITALS — BP 169/68 | HR 63 | Temp 97.0°F | Resp 18 | Ht 63.5 in | Wt 160.8 lb

## 2016-12-21 DIAGNOSIS — Z170 Estrogen receptor positive status [ER+]: Principal | ICD-10-CM

## 2016-12-21 DIAGNOSIS — C50411 Malignant neoplasm of upper-outer quadrant of right female breast: Secondary | ICD-10-CM

## 2016-12-21 DIAGNOSIS — Z79811 Long term (current) use of aromatase inhibitors: Secondary | ICD-10-CM | POA: Diagnosis not present

## 2016-12-21 DIAGNOSIS — I10 Essential (primary) hypertension: Secondary | ICD-10-CM | POA: Diagnosis not present

## 2016-12-21 DIAGNOSIS — C50811 Malignant neoplasm of overlapping sites of right female breast: Secondary | ICD-10-CM

## 2016-12-21 LAB — CBC WITH DIFFERENTIAL/PLATELET
BASO%: 0.1 % (ref 0.0–2.0)
Basophils Absolute: 0 10*3/uL (ref 0.0–0.1)
EOS ABS: 0.2 10*3/uL (ref 0.0–0.5)
EOS%: 2.8 % (ref 0.0–7.0)
HEMATOCRIT: 43.8 % (ref 34.8–46.6)
HGB: 14.2 g/dL (ref 11.6–15.9)
LYMPH#: 2.2 10*3/uL (ref 0.9–3.3)
LYMPH%: 28.9 % (ref 14.0–49.7)
MCH: 30.3 pg (ref 25.1–34.0)
MCHC: 32.4 g/dL (ref 31.5–36.0)
MCV: 93.4 fL (ref 79.5–101.0)
MONO#: 0.6 10*3/uL (ref 0.1–0.9)
MONO%: 8.3 % (ref 0.0–14.0)
NEUT%: 59.9 % (ref 38.4–76.8)
NEUTROS ABS: 4.6 10*3/uL (ref 1.5–6.5)
Platelets: 189 10*3/uL (ref 145–400)
RBC: 4.69 10*6/uL (ref 3.70–5.45)
RDW: 13.3 % (ref 11.2–14.5)
WBC: 7.6 10*3/uL (ref 3.9–10.3)

## 2016-12-21 LAB — COMPREHENSIVE METABOLIC PANEL
ALBUMIN: 3.8 g/dL (ref 3.5–5.0)
ALK PHOS: 199 U/L — AB (ref 40–150)
ALT: 15 U/L (ref 0–55)
AST: 16 U/L (ref 5–34)
Anion Gap: 9 mEq/L (ref 3–11)
BILIRUBIN TOTAL: 0.45 mg/dL (ref 0.20–1.20)
BUN: 15.4 mg/dL (ref 7.0–26.0)
CALCIUM: 9.7 mg/dL (ref 8.4–10.4)
CO2: 27 mEq/L (ref 22–29)
Chloride: 104 mEq/L (ref 98–109)
Creatinine: 0.7 mg/dL (ref 0.6–1.1)
EGFR: 74 mL/min/{1.73_m2} — AB (ref >90)
GLUCOSE: 95 mg/dL (ref 70–140)
Potassium: 4.1 mEq/L (ref 3.5–5.1)
SODIUM: 140 meq/L (ref 136–145)
Total Protein: 6.9 g/dL (ref 6.4–8.3)

## 2016-12-21 NOTE — Telephone Encounter (Signed)
Gv pt appt for Nov 2018. Also, gv appt for bone density on 8/21 @ 9am at [place].

## 2016-12-23 ENCOUNTER — Encounter: Payer: Self-pay | Admitting: Hematology

## 2016-12-31 ENCOUNTER — Encounter: Payer: Self-pay | Admitting: Internal Medicine

## 2017-01-04 ENCOUNTER — Ambulatory Visit (INDEPENDENT_AMBULATORY_CARE_PROVIDER_SITE_OTHER): Payer: Medicare Other | Admitting: Internal Medicine

## 2017-01-04 ENCOUNTER — Encounter: Payer: Self-pay | Admitting: Internal Medicine

## 2017-01-04 VITALS — BP 144/76 | HR 68 | Temp 98.6°F | Ht 63.5 in | Wt 160.4 lb

## 2017-01-04 DIAGNOSIS — J209 Acute bronchitis, unspecified: Secondary | ICD-10-CM

## 2017-01-04 DIAGNOSIS — M150 Primary generalized (osteo)arthritis: Secondary | ICD-10-CM

## 2017-01-04 DIAGNOSIS — J440 Chronic obstructive pulmonary disease with acute lower respiratory infection: Secondary | ICD-10-CM | POA: Diagnosis not present

## 2017-01-04 DIAGNOSIS — M8949 Other hypertrophic osteoarthropathy, multiple sites: Secondary | ICD-10-CM

## 2017-01-04 DIAGNOSIS — I10 Essential (primary) hypertension: Secondary | ICD-10-CM

## 2017-01-04 DIAGNOSIS — M159 Polyosteoarthritis, unspecified: Secondary | ICD-10-CM

## 2017-01-04 MED ORDER — AZITHROMYCIN 250 MG PO TABS: ORAL_TABLET | ORAL | 0 refills | Status: DC

## 2017-01-04 NOTE — Progress Notes (Signed)
Subjective:    Patient ID: Teresa Frey, female    DOB: [birth year], 83 y.o.   MRN: [id number]  HPI 83 year old patient who presents with a 2 day history of chest congestion and productive cough.  She has had some mild sore throat. She has been using OTC medications with minimal benefit. She feels she has the same illness as a great granddaughter who was seen by her pediatrician and required antibiotic therapy for an ear infection. The patient requests a Z-Pak.  She plans on traveling to [place] next week to visit a granddaughter in the service.  She is requesting a handicap placard due to her arthritis  Past Medical History:  Diagnosis Date  . ANXIETY 04/07/2007  . Breast cancer of upper-outer quadrant of right female breast ([facility]) 01/11/2016  . COLONIC POLYPS, HX OF 04/02/2007   adenomatous  . DIVERTICULITIS, HX OF 04/02/2007  . Diverticulosis   . DJD (degenerative joint disease)   . Hemorrhoids   . HYPERTENSION 04/02/2007  . OSTEOARTHRITIS, SEVERE 04/07/2007  . 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 WEIGHT GAIN 11/13/2007     Social History   Social History  . Marital status: Married    Spouse name: N/A  . Number of children: 2  . Years of education: N/A   Occupational History  . Not on file.   Social History Main Topics  . Smoking status: Former Smoker    Packs/day: 1.00    Years: 27.00    Types: Cigarettes    Quit date: 06/04/1978  . Smokeless tobacco: Never Used  . Alcohol use 4.2 oz/week    7 Glasses of wine per week     Comment: 1-2 glasses wine after dinner, but not necessarily each day  . Drug use: No  . Sexual activity: Not on file   Other Topics Concern  . Not on file   Social History Narrative  . No narrative on file    Past Surgical History:  Procedure Laterality Date  . APPENDECTOMY    . BREAST LUMPECTOMY WITH RADIOACTIVE SEED LOCALIZATION Right 02/08/2016   Procedure: RIGHT BREAST LUMPECTOMY WITH RADIOACTIVE SEED LOCALIZATION;  Surgeon:  Claud Kelp, MD;  Location: [facility];  Service: General;  Laterality: Right;  . TONSILLECTOMY    . TOTAL HIP ARTHROPLASTY  1999, left hip    2006 right hip  . TUBAL LIGATION      Family History  Problem Relation Age of Onset  . Pancreatic cancer Mother 49       d. 9  . Cancer Father 69       hodgkin's lymphoma and leukemia; d. 41  . Colon cancer Brother        dx. late 47s; smoker  . Pancreatic cancer Brother 80       d. 16; smoker  . Lung cancer Brother 40       smoker  . Heart attack Brother        d. late 39s  . Colon cancer Paternal Aunt   . Breast cancer Daughter 24       negative genetic testing in 2016  . Skin cancer Daughter        non-melanoma type; +sun exposure  . Kidney failure Son 66       +EtOH abuse resulting in liver, kidney, and pancreas failure  . Diabetes Maternal Uncle        d. later age  . Heart disease Neg Hx  family  . Esophageal cancer Neg Hx   . Rectal cancer Neg Hx   . Stomach cancer Neg Hx     No Known Allergies  Current Outpatient Prescriptions on File Prior to Visit  Medication Sig Dispense Refill  . anastrozole (ARIMIDEX) 1 MG tablet TAKE 1 TABLET BY MOUTH DAILY. 90 tablet 3  . Ascorbic Acid (VITAMIN C) 1000 MG tablet Take 1,000 mg by mouth daily.    Marland Kitchen aspirin EC 81 MG EC tablet Take 1 tablet (81 mg total) by mouth daily. 30 tablet 0  . BIOTIN 5000 PO Take 1 tablet by mouth daily.    . Calcium Carb-Cholecalciferol (CALCIUM 1000 + D PO) Take 1 tablet by mouth daily.    . clobetasol (TEMOVATE) 0.05 % external solution   0  . hydrochlorothiazide (MICROZIDE) 12.5 MG capsule TAKE 1 CAPSULE (12.5 MG TOTAL) BY MOUTH DAILY. 30 capsule 3  . ketoconazole (NIZORAL) 2 % shampoo   2  . meloxicam (MOBIC) 7.5 MG tablet Take 1 tablet (7.5 mg total) by mouth daily. 60 tablet 0  . metoprolol tartrate (LOPRESSOR) 25 MG tablet Take 0.5 tablets (12.5 mg total) by mouth 2 (two) times daily. 90 tablet 1  . Multiple  Vitamins-Minerals (CENTRUM SILVER ADULT 50+) TABS Take 1 tablet by mouth daily.    Marland Kitchen omega-3 acid ethyl esters (LOVAZA) 1 g capsule Take by mouth 2 (two) times daily.    . sertraline (ZOLOFT) 50 MG tablet TAKE 1 TABLET BY MOUTH DAILY. 90 tablet 3   No current facility-administered medications on file prior to visit.     BP (!) 158/78 (BP Location: Left Arm, Patient Position: Sitting, Cuff Size: Normal)   Pulse 68   Temp 98.6 F (37 C) (Oral)   Ht 5' 3.5" (1.613 m)   Wt 160 lb 6.4 oz (72.8 kg)   SpO2 97%   BMI 27.97 kg/m      Review of Systems  Constitutional: Positive for activity change, appetite change and fatigue.  HENT: Positive for sore throat.   Respiratory: Positive for cough.        Objective:   Physical Exam  Constitutional: She is oriented to person, place, and time. She appears well-developed and well-nourished.  HENT:  Head: Normocephalic.  Right Ear: External ear normal.  Left Ear: External ear normal.  Mouth/Throat: Oropharynx is clear and moist.  Oropharynx appeared normal  Eyes: Pupils are equal, round, and reactive to light. Conjunctivae and EOM are normal.  Neck: Normal range of motion. Neck supple. No thyromegaly present.  Cardiovascular: Normal rate, regular rhythm, normal heart sounds and intact distal pulses.   Pulmonary/Chest: Effort normal. She has rales.  Few crackles right lower lobe  Abdominal: Soft. Bowel sounds are normal. She exhibits no mass. There is no tenderness.  Musculoskeletal: Normal range of motion.  Lymphadenopathy:    She has no cervical adenopathy.  Neurological: She is alert and oriented to person, place, and time.  Skin: Skin is warm and dry. No rash noted.  Psychiatric: She has a normal mood and affect. Her behavior is normal.          Assessment & Plan:   Viral URI with cough.  Will treat symptomatically.  Patient was told that antibiotics not appropriate at this stage.  She was given a prescription for a Z-Pak to  fill only if symptoms worsen or she develops fever Essential hypertension.  Repeat blood pressure improved 144 over 76  Osteoarthritis.  Application for disability parking placard  completed  Follow-up 6 months  Jonuel Butterfield Homero Fellers

## 2017-01-04 NOTE — Patient Instructions (Signed)
  Acute bronchitis symptoms for less than 10 days are generally not helped by antibiotics.  Take over-the-counter expectorants and cough medications such as  Mucinex DM.  Call if there is no improvement in 5 to 7 days or if  you develop worsening cough, fever, or new symptoms, such as shortness of breath or chest pain.  Please check your blood pressure on a regular basis.  If it is consistently greater than 150/90, please make an office appointment.

## 2017-01-10 ENCOUNTER — Telehealth: Payer: Self-pay

## 2017-01-10 MED ORDER — AMOXICILLIN-POT CLAVULANATE 875-125 MG PO TABS: 1.0000 | ORAL_TABLET | Freq: Two times a day (BID) | ORAL | 0 refills | Status: AC

## 2017-01-10 NOTE — Telephone Encounter (Signed)
Pt called to report that she has finished abx she was given 01/04/17. She reports that she is not improved. She has continued productive cough with green mucus. She denies f/n/v or wheeze/ShOB. She is scheduled to fly to [place] on Saturday and would like to have a different/stronger abx sent in to her pharmacy.   Dr. Raliegh Ip is not in the office today.  Dr. Yong Channel - Please advise. Thanks!

## 2017-01-10 NOTE — Telephone Encounter (Signed)
Spoke with pt and advised. Nothing further needed.  

## 2017-01-10 NOTE — Telephone Encounter (Signed)
Sent in Augmentin. Please inform patient. Should follow upwith US in office if symptoms do not resolve with this

## 2017-01-22 DIAGNOSIS — Z780 Asymptomatic menopausal state: Secondary | ICD-10-CM | POA: Diagnosis not present

## 2017-01-22 LAB — HM DEXA SCAN

## 2017-01-31 ENCOUNTER — Encounter: Payer: Self-pay | Admitting: Internal Medicine

## 2017-02-01 ENCOUNTER — Telehealth: Payer: Self-pay | Admitting: *Deleted

## 2017-02-01 NOTE — Telephone Encounter (Signed)
Called pt and left message on pt's identified voice mail re:  Bone density within normal limits - as per Dr. Ernestina Penna instructions.

## 2017-02-21 ENCOUNTER — Encounter: Payer: Self-pay | Admitting: Internal Medicine

## 2017-03-04 ENCOUNTER — Ambulatory Visit (INDEPENDENT_AMBULATORY_CARE_PROVIDER_SITE_OTHER): Payer: Medicare Other

## 2017-03-04 ENCOUNTER — Ambulatory Visit (INDEPENDENT_AMBULATORY_CARE_PROVIDER_SITE_OTHER): Payer: Medicare Other | Admitting: Orthopaedic Surgery

## 2017-03-04 DIAGNOSIS — M545 Low back pain, unspecified: Secondary | ICD-10-CM

## 2017-03-04 DIAGNOSIS — M25552 Pain in left hip: Secondary | ICD-10-CM | POA: Diagnosis not present

## 2017-03-04 DIAGNOSIS — M79642 Pain in left hand: Secondary | ICD-10-CM

## 2017-03-04 DIAGNOSIS — M25551 Pain in right hip: Secondary | ICD-10-CM

## 2017-03-04 MED ORDER — MELOXICAM 7.5 MG PO TABS: 7.5000 mg | ORAL_TABLET | Freq: Every day | ORAL | 3 refills | Status: DC | PRN

## 2017-03-04 MED ORDER — TRAMADOL HCL 50 MG PO TABS: 50.0000 mg | ORAL_TABLET | Freq: Three times a day (TID) | ORAL | 0 refills | Status: DC | PRN

## 2017-03-04 NOTE — Progress Notes (Signed)
Office Visit Note   Patient: Teresa Frey           Date of Birth: 1929/08/09           MRN: [id number] Visit Date: 03/04/2017              Requested by: Marletta Lor, MD [place], [place] [address] PCP: Marletta Lor, MD   Assessment & Plan: Visit Diagnoses:  1. Pain in left hand   2. Acute bilateral low back pain without sciatica   3. Bilateral hip pain     Plan: She definitely needs use a walker for ambulation. She is here primary care physician for some of her weakness in general. We'll try some meloxicam and tramadol. From my standpoint she'll follow-up as needed.  Follow-Up Instructions: Return if symptoms worsen or fail to improve.   Orders:  Orders Placed This Encounter  Procedures  . XR Hand Complete Left  . XR Lumbar Spine 2-3 Views  . XR HIPS BILAT W OR W/O PELVIS 2V   Meds ordered this encounter  Medications  . meloxicam (MOBIC) 7.5 MG tablet    Sig: Take 1 tablet (7.5 mg total) by mouth daily as needed for pain.    Dispense:  60 tablet    Refill:  3  . traMADol (ULTRAM) 50 MG tablet    Sig: Take 1 tablet (50 mg total) by mouth 3 (three) times daily as needed.    Dispense:  60 tablet    Refill:  0      Procedures: No procedures performed   Clinical Data: No additional findings.   Subjective: No chief complaint on file. The patient comes in today with multiple chief complaints. She is 83 years old and has a walker at home and a cane but she is not using it and she's had a fall about 2 weeks ago. She is leaving question whether or not she had a stroke however she has not seen her primary care physician yet. She complains of left hip pain, right hip pain, low back pain, ankle pain, and left hand pain. She was to make sure none of these show any type of break. She points the middle fingers social pain in her left hand. She said the left hand is weak to her. She said she's been reluctant to use a walker but she has used 1  recently. She reports bilateral hip replacements done by other partners here. She has chronic back pain and other issues as well.  HPI  Review of Systems She currently denies any headache, chest pain, shortness of breath, fever, chills, nausea, vomiting.  Objective: Vital Signs: There were no vitals taken for this visit.  Physical Exam He is alert or 3 and in no acute distress Ortho Exam Examination of her left hand shows swelling at the PIP joint of the middle finger but good range of motion overall. She has swelling of multiple joints of the hand. There is no acute findings. The hand does not feel ligamentously stable. Examination of both hips have fluid range of motion of both hips with no, getting features. She has significant limitations flexion extension of her lumbar spine with global pain but no acute findings. Neither upper or lower extremities showed no gross functional deficits Specialty Comments:  No specialty comments available.  Imaging: Xr Hips Bilat W Or W/o Pelvis 2v  Result Date: 03/04/2017 An AP pelvis and lateral both hips show bilateral total hip arthroplasties  with no acute findings.  Xr Hand Complete Left  Result Date: 03/04/2017 3 views of the left hand show no acute fractures. There is significant arthritic changes throughout the hand.  Xr Lumbar Spine 2-3 Views  Result Date: 03/04/2017 2 views of lumbar spine show no acute findings. She has severe degenerative disc disease at L4-5 and L5-S1 that is not changed from films from a year ago. There are no acute findings.    PMFS History: Patient Active Problem List   Diagnosis Date Noted  . Genetic testing 04/01/2016  . possible sleep apnea by history 02/12/2016  . Atrial fibrillation with RVR ([facility]) 02/11/2016  . New onset atrial fibrillation ([facility]) 02/11/2016  . Surgical wound infection 02/11/2016  . Breast cancer of upper-outer quadrant of right female breast ([facility]) 01/11/2016  . Impaired glucose  tolerance 07/28/2014  . ANXIETY 04/07/2007  . Osteoarthritis 04/07/2007  . Essential hypertension 04/02/2007  . History of colonic polyps 04/02/2007  . DIVERTICULITIS, HX OF 04/02/2007   Past Medical History:  Diagnosis Date  . ANXIETY 04/07/2007  . Breast cancer of upper-outer quadrant of right female breast ([facility]) 01/11/2016  . COLONIC POLYPS, HX OF 04/02/2007   adenomatous  . DIVERTICULITIS, HX OF 04/02/2007  . Diverticulosis   . DJD (degenerative joint disease)   . Hemorrhoids   . HYPERTENSION 04/02/2007  . OSTEOARTHRITIS, SEVERE 04/07/2007  . 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 WEIGHT GAIN 11/13/2007    Family History  Problem Relation Age of Onset  . Pancreatic cancer Mother 4       d. 70  . Cancer Father 55       hodgkin's lymphoma and leukemia; d. 71  . Colon cancer Brother        dx. late 34s; smoker  . Pancreatic cancer Brother 80       d. 45; smoker  . Lung cancer Brother 40       smoker  . Heart attack Brother        d. late 45s  . Colon cancer Paternal Aunt   . Breast cancer Daughter 84       negative genetic testing in 2016  . Skin cancer Daughter        non-melanoma type; +sun exposure  . Kidney failure Son 61       +EtOH abuse resulting in liver, kidney, and pancreas failure  . Diabetes Maternal Uncle        d. later age  . Heart disease Neg Hx        family  . Esophageal cancer Neg Hx   . Rectal cancer Neg Hx   . Stomach cancer Neg Hx     Past Surgical History:  Procedure Laterality Date  . APPENDECTOMY    . BREAST LUMPECTOMY WITH RADIOACTIVE SEED LOCALIZATION Right 02/08/2016   Procedure: RIGHT BREAST LUMPECTOMY WITH RADIOACTIVE SEED LOCALIZATION;  Surgeon: Fanny Skates, MD;  Location: [facility];  Service: General;  Laterality: Right;  . TONSILLECTOMY    . TOTAL HIP ARTHROPLASTY  1999, left hip    2006 right hip  . TUBAL LIGATION     Social History   Occupational History  . Not on file.   Social History Main  Topics  . Smoking status: Former Smoker    Packs/day: 1.00    Years: 27.00    Types: Cigarettes    Quit date: 06/04/1978  . Smokeless tobacco: Never Used  . Alcohol use 4.2  oz/week    7 Glasses of wine per week     Comment: 1-2 glasses wine after dinner, but not necessarily each day  . Drug use: No  . Sexual activity: Not on file

## 2017-03-06 ENCOUNTER — Encounter: Payer: Self-pay | Admitting: Internal Medicine

## 2017-03-06 ENCOUNTER — Ambulatory Visit (INDEPENDENT_AMBULATORY_CARE_PROVIDER_SITE_OTHER)
Admission: RE | Admit: 2017-03-06 | Discharge: 2017-03-06 | Disposition: A | Discharge status: Home / Self Care | Payer: Medicare Other | Source: Ambulatory Visit | Attending: Internal Medicine | Admitting: Internal Medicine

## 2017-03-06 ENCOUNTER — Ambulatory Visit (INDEPENDENT_AMBULATORY_CARE_PROVIDER_SITE_OTHER): Payer: Medicare Other | Admitting: Internal Medicine

## 2017-03-06 VITALS — BP 148/62 | Temp 97.8°F | Ht 63.5 in | Wt 158.7 lb

## 2017-03-06 DIAGNOSIS — R2681 Unsteadiness on feet: Secondary | ICD-10-CM

## 2017-03-06 DIAGNOSIS — R410 Disorientation, unspecified: Secondary | ICD-10-CM

## 2017-03-06 DIAGNOSIS — S0990XA Unspecified injury of head, initial encounter: Secondary | ICD-10-CM | POA: Diagnosis not present

## 2017-03-06 DIAGNOSIS — I10 Essential (primary) hypertension: Secondary | ICD-10-CM

## 2017-03-06 LAB — CBC WITH DIFFERENTIAL/PLATELET
BASOS ABS: 0 10*3/uL (ref 0.0–0.1)
Basophils Relative: 0.2 % (ref 0.0–3.0)
Eosinophils Absolute: 0.1 10*3/uL (ref 0.0–0.7)
Eosinophils Relative: 1.3 % (ref 0.0–5.0)
HCT: 44.5 % (ref 36.0–46.0)
HEMOGLOBIN: 14.6 g/dL (ref 12.0–15.0)
LYMPHS ABS: 2 10*3/uL (ref 0.7–4.0)
Lymphocytes Relative: 20.4 % (ref 12.0–46.0)
MCHC: 32.9 g/dL (ref 30.0–36.0)
MCV: 91 fl (ref 78.0–100.0)
MONOS PCT: 7.6 % (ref 3.0–12.0)
Monocytes Absolute: 0.7 10*3/uL (ref 0.1–1.0)
NEUTROS PCT: 70.5 % (ref 43.0–77.0)
Neutro Abs: 6.8 10*3/uL (ref 1.4–7.7)
Platelets: 196 10*3/uL (ref 150.0–400.0)
RBC: 4.89 Mil/uL (ref 3.87–5.11)
RDW: 14.3 % (ref 11.5–15.5)
WBC: 9.7 10*3/uL (ref 4.0–10.5)

## 2017-03-06 LAB — COMPREHENSIVE METABOLIC PANEL
ALBUMIN: 4.1 g/dL (ref 3.5–5.2)
ALK PHOS: 155 U/L — AB (ref 39–117)
ALT: 11 U/L (ref 0–35)
AST: 13 U/L (ref 0–37)
BILIRUBIN TOTAL: 0.5 mg/dL (ref 0.2–1.2)
BUN: 20 mg/dL (ref 6–23)
CALCIUM: 9.7 mg/dL (ref 8.4–10.5)
CO2: 33 mEq/L — ABNORMAL HIGH (ref 19–32)
Chloride: 102 mEq/L (ref 96–112)
Creatinine, Ser: 0.73 mg/dL (ref 0.40–1.20)
GFR: 80.43 mL/min (ref >60.00)
GLUCOSE: 89 mg/dL (ref 70–99)
Potassium: 3.7 mEq/L (ref 3.5–5.1)
Sodium: 143 mEq/L (ref 135–145)
TOTAL PROTEIN: 6.6 g/dL (ref 6.0–8.3)

## 2017-03-06 LAB — TSH: TSH: 1.66 u[IU]/mL (ref 0.35–4.50)

## 2017-03-06 NOTE — Patient Instructions (Signed)
Head CT scan as discussed  Report any clinical change  Follow-up 2 weeks

## 2017-03-06 NOTE — Progress Notes (Signed)
Subjective:    Patient ID: Teresa Frey, female    DOB: May 14, 1930, 83 y.o.   MRN: [id number]  HPI  83 year old patient who is seen today accompanied by her daughter. She states that she fell backwards about 3 weeks ago when she lost her balance and did sustain some head trauma at that time Since this fall, she continues to work but has noted a great change in her work efficiency.  She states it is taking twice the time to perform similar tasks. Her penmanship which is usually quite neat has become much less legible.  She has noticed some change in her speech and at times has some word finding difficulties.  She states she frequently loses her train of thought She was seen by orthopedics.  2 days ago and was placed on meloxicam and tramadol due to back and hip discomfort.  She has been on a walker since that time.  Her gait remains unsteady  MMSE performed today with a score of 27/30.  She was able to recall only 1 of 3 objects.  Unable to copy intersecting pentagons  Past Medical History:  Diagnosis Date  . ANXIETY 04/07/2007  . Breast cancer of upper-outer quadrant of right female breast ([facility]) 01/11/2016  . COLONIC POLYPS, HX OF 04/02/2007   adenomatous  . DIVERTICULITIS, HX OF 04/02/2007  . Diverticulosis   . DJD (degenerative joint disease)   . Hemorrhoids   . HYPERTENSION 04/02/2007  . OSTEOARTHRITIS, SEVERE 04/07/2007  . 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 WEIGHT GAIN 11/13/2007     Social History   Social History  . Marital status: Married    Spouse name: N/A  . Number of children: 2  . Years of education: N/A   Occupational History  . Not on file.   Social History Main Topics  . Smoking status: Former Smoker    Packs/day: 1.00    Years: 27.00    Types: Cigarettes    Quit date: 06/04/1978  . Smokeless tobacco: Never Used  . Alcohol use 4.2 oz/week    7 Glasses of wine per week     Comment: 1-2 glasses wine after dinner, but not necessarily each day    . Drug use: No  . Sexual activity: Not on file   Other Topics Concern  . Not on file   Social History Narrative  . No narrative on file    Past Surgical History:  Procedure Laterality Date  . APPENDECTOMY    . BREAST LUMPECTOMY WITH RADIOACTIVE SEED LOCALIZATION Right 02/08/2016   Procedure: RIGHT BREAST LUMPECTOMY WITH RADIOACTIVE SEED LOCALIZATION;  Surgeon: Claud Kelp, MD;  Location: [facility];  Service: General;  Laterality: Right;  . TONSILLECTOMY    . TOTAL HIP ARTHROPLASTY  1999, left hip    2006 right hip  . TUBAL LIGATION      Family History  Problem Relation Age of Onset  . Pancreatic cancer Mother 50       d. 63  . Cancer Father 65       hodgkin's lymphoma and leukemia; d. 48  . Colon cancer Brother        dx. late 45s; smoker  . Pancreatic cancer Brother 80       d. 75; smoker  . Lung cancer Brother 40       smoker  . Heart attack Brother        d. late 39s  . Colon  cancer Paternal Aunt   . Breast cancer Daughter 59       negative genetic testing in 2016  . Skin cancer Daughter        non-melanoma type; +sun exposure  . Kidney failure Son 106       +EtOH abuse resulting in liver, kidney, and pancreas failure  . Diabetes Maternal Uncle        d. later age  . Heart disease Neg Hx        family  . Esophageal cancer Neg Hx   . Rectal cancer Neg Hx   . Stomach cancer Neg Hx     No Known Allergies  Current Outpatient Prescriptions on File Prior to Visit  Medication Sig Dispense Refill  . anastrozole (ARIMIDEX) 1 MG tablet TAKE 1 TABLET BY MOUTH DAILY. 90 tablet 3  . Ascorbic Acid (VITAMIN C) 1000 MG tablet Take 1,000 mg by mouth daily.    Marland Kitchen aspirin EC 81 MG EC tablet Take 1 tablet (81 mg total) by mouth daily. 30 tablet 0  . BIOTIN 5000 PO Take 1 tablet by mouth daily.    . Calcium Carb-Cholecalciferol (CALCIUM 1000 + D PO) Take 1 tablet by mouth daily.    . clobetasol (TEMOVATE) 0.05 % external solution   0  . hydrochlorothiazide  (MICROZIDE) 12.5 MG capsule TAKE 1 CAPSULE (12.5 MG TOTAL) BY MOUTH DAILY. 30 capsule 3  . ketoconazole (NIZORAL) 2 % shampoo   2  . meloxicam (MOBIC) 7.5 MG tablet Take 1 tablet (7.5 mg total) by mouth daily as needed for pain. 60 tablet 3  . metoprolol tartrate (LOPRESSOR) 25 MG tablet Take 0.5 tablets (12.5 mg total) by mouth 2 (two) times daily. 90 tablet 1  . Multiple Vitamins-Minerals (CENTRUM SILVER ADULT 50+) TABS Take 1 tablet by mouth daily.    Marland Kitchen omega-3 acid ethyl esters (LOVAZA) 1 g capsule Take by mouth 2 (two) times daily.    . sertraline (ZOLOFT) 50 MG tablet TAKE 1 TABLET BY MOUTH DAILY. 90 tablet 3  . traMADol (ULTRAM) 50 MG tablet Take 1 tablet (50 mg total) by mouth 3 (three) times daily as needed. 60 tablet 0   No current facility-administered medications on file prior to visit.     BP (!) 148/62 (BP Location: Left Arm, Patient Position: Sitting, Cuff Size: Normal)   Temp 97.8 F (36.6 C) (Oral)   Ht 5' 3.5" (1.613 m)   Wt 158 lb 11.2 oz (72 kg)   BMI 27.67 kg/m    Review of Systems  Constitutional: Negative.   HENT: Negative for congestion, dental problem, hearing loss, rhinorrhea, sinus pressure, sore throat and tinnitus.   Eyes: Negative for pain, discharge and visual disturbance.  Respiratory: Negative for cough and shortness of breath.   Cardiovascular: Negative for chest pain, palpitations and leg swelling.  Gastrointestinal: Negative for abdominal distention, abdominal pain, blood in stool, constipation, diarrhea, nausea and vomiting.  Genitourinary: Negative for difficulty urinating, dysuria, flank pain, frequency, hematuria, pelvic pain, urgency, vaginal bleeding, vaginal discharge and vaginal pain.  Musculoskeletal: Positive for arthralgias, back pain and gait problem. Negative for joint swelling.  Skin: Negative for rash.  Neurological: Negative for dizziness, syncope, speech difficulty, weakness, numbness and headaches.  Hematological: Negative for  adenopathy.  Psychiatric/Behavioral: Positive for confusion and decreased concentration. Negative for agitation, behavioral problems and dysphoric mood. The patient is not nervous/anxious.        Objective:   Physical Exam  Constitutional: She is oriented to  person, place, and time. She appears well-developed and well-nourished. No distress.  Blood pressure 140/70  HENT:  Head: Normocephalic.  Right Ear: External ear normal.  Left Ear: External ear normal.  Mouth/Throat: Oropharynx is clear and moist.  Eyes: Pupils are equal, round, and reactive to light. Conjunctivae and EOM are normal.  Neck: Normal range of motion. Neck supple. No thyromegaly present.  Cardiovascular: Normal rate, regular rhythm, normal heart sounds and intact distal pulses.   Pulmonary/Chest: Effort normal and breath sounds normal.  Abdominal: Soft. Bowel sounds are normal. She exhibits no mass. There is no tenderness.  Musculoskeletal: Normal range of motion.  Gait is slow and unsteady probably due to arthritis and not neurological issues  Lymphadenopathy:    She has no cervical adenopathy.  Neurological: She is alert and oriented to person, place, and time. No cranial nerve deficit. Coordination normal.  No drift Finger to nose testing normal Slow, unsteady gait  Skin: Skin is warm and dry. No rash noted.  Psychiatric: She has a normal mood and affect. Her behavior is normal.          Assessment & Plan:   Recent history of fall associated with head trauma Mental status changes Osteoarthritis with unsteady gait  We'll check screening lab Check head CT without contrast Patient report any new symptoms or clinical worsening  Follow-up 2 weeks  Chandler Swiderski Homero Fellers

## 2017-03-07 ENCOUNTER — Telehealth: Payer: Self-pay | Admitting: *Deleted

## 2017-03-07 NOTE — Telephone Encounter (Signed)
Patient's daughter called to receive the results from patient's brain scan. Please call [phone] or [phone]

## 2017-03-07 NOTE — Telephone Encounter (Signed)
Pts daughter is calling x2 to get the xray results and is aware as soon as the Dr. Has seen it they will give them a call.

## 2017-03-08 NOTE — Telephone Encounter (Signed)
Please advise 

## 2017-03-11 NOTE — Telephone Encounter (Signed)
Spoke with pt and informed her that procedure results are normal. Pt verbalized understanding.

## 2017-03-11 NOTE — Telephone Encounter (Signed)
Please call/notify patient that lab/test/procedure is normal 

## 2017-03-19 DIAGNOSIS — F419 Anxiety disorder, unspecified: Secondary | ICD-10-CM | POA: Diagnosis not present

## 2017-03-19 DIAGNOSIS — C50411 Malignant neoplasm of upper-outer quadrant of right female breast: Secondary | ICD-10-CM | POA: Diagnosis not present

## 2017-03-19 DIAGNOSIS — I10 Essential (primary) hypertension: Secondary | ICD-10-CM | POA: Diagnosis not present

## 2017-03-19 DIAGNOSIS — Z803 Family history of malignant neoplasm of breast: Secondary | ICD-10-CM | POA: Diagnosis not present

## 2017-04-01 ENCOUNTER — Other Ambulatory Visit: Payer: Self-pay | Admitting: Internal Medicine

## 2017-04-18 NOTE — Progress Notes (Signed)
[place]  Telephone:([phone]) [phone] Fax:(336) [phone]  Clinic follow up Note   Patient Care Team: Teresa Lor, MD as PCP - Teresa Chick, MD as Consulting Physician (General Surgery) Teresa Merle, MD as Consulting Physician (Hematology) Teresa Pray, MD as Consulting Physician (Radiation Oncology) Teresa Phlegm, NP as Nurse Practitioner (Hematology and Oncology) 04/19/2017  CHIEF COMPLAINTS:  Follow up right breast cancer  Oncology History   Breast cancer of upper-outer quadrant of right female breast [facility])   Staging form: Breast, AJCC 7th Edition   - Clinical stage from 01/18/2016: Stage IA (T1b, N0, M0) - Unsigned   - Pathologic stage from 02/08/2016: Stage IA (T1c, N0, cM0) - Signed by Teresa Merle, MD on 02/27/2016      Breast cancer of upper-outer quadrant of right female breast ([place])   01/05/2016 Mammogram    Diagnostic mammogram and ultrasound showed a 9 mm mass in the upper outer quadrant of right breast, axillary nodes were negative.      01/09/2016 Initial Diagnosis    Breast cancer of upper-outer quadrant of right female breast ([place])      01/09/2016 Initial Biopsy    R breast 9:00 position biopsy showed invasive ductal carcinoma, G1-2      01/09/2016 Receptors her2    ER 90%+, PR 40%+, HER2-, Ki67 5%      02/08/2016 Surgery    Right breast lumpectomy, no SLN biopsy        02/08/2016 Pathology Results    Right breast lumpectomy showed invasive and in situ ductal carcinoma, 1.1 cm, margins were negative. Grade 1, no lymphovascular invasion. Atypical lobular hyperplasia.      02/21/2016 Genetic Testing    Genetic testing did not reveal a known deleterious mutation.  Genetic testing did identify a variant of uncertain significance (VUS) called "c.1659G>A (p.Met553Ile)" in one copy of the NBN gene.  Genes tested include: APC, ATM, AXIN2, BARD1, BMPR1A, BRCA1, BRCA2, BRIP1, CDH1, CDK4, CDKN2A, CHEK2, EPCAM, FANCC, MLH1, MSH2, MSH6,  MUTYH, NBN, PALB2, PMS2, POLD1, POLE, PTEN, RAD51C, RAD51D, SCG5/GREM1, SMAD4, STK11, TP53, VHL, and XRCC2.      02/28/2016 -  Anti-estrogen oral therapy    Anastrozole 1 mg daily        HISTORY OF PRESENTING ILLNESS:  Teresa Frey 83 y.o. female is here because of Her recently diagnosed right breast cancer. She is accompanied by her daughter Teresa Frey to our multidisciplinary clinic today.  This was discovered by screening mammogram. She never had abnormal screening mammogram or breast biopsy in the past. The diagnostic mammogram showed a 6 mm mass in the upper outer quadrant of right breast, it measured 9 mm by ultrasound. A history nodes were negative by ultrasound. She underwent ultrasound guided biopsy on 01/09/2016, which showed invasive ductal carcinoma, ER/PR positive, HER-2 negative.  She lives alone, lives with her husband, who had multiple cancer, on tube feeds, not active. She is his caregiver. She is very independent. She still works full-time for [employer]. She denies any symptoms. Her daughter Teresa Frey was diagnosed with breast cancer at age of 32. Her son died at age of 88 from alcohol. She helps to take of her grandchildren who is 72.  GYN HISTORY  Menarchal:13 LMP: late 64's  Contraceptive: a few years  HRT: no G2P2: her son died in 23's, she has a daughter   CURRENT THERAPY: anastrozole 72m daily started on 02/28/2016   INTERIM HISTORY: Teresa CShepheardreturns for follow-up. She presents to the clinic  today by herself.  She had a incidental fall about 1 months ago, no injury.  No residual pain.  She developed some difficulty writing, and a mild balance issue about 2 months ago, was seen by her orthopedic surgeon and her primary care physician, and her symptoms resolved after her stopped tramadol.  She is doing well overall, has mild diffuse arthritis joint pain, stable overall.  She is tolerating anastrozole well otherwise, no significant hot flashes or other side  effects.   MEDICAL HISTORY:  Past Medical History:  Diagnosis Date  . ANXIETY 04/07/2007  . Breast cancer of upper-outer quadrant of right female breast ([place]) 01/11/2016  . COLONIC POLYPS, HX OF 04/02/2007   adenomatous  . DIVERTICULITIS, HX OF 04/02/2007  . Diverticulosis   . DJD (degenerative joint disease)   . Hemorrhoids   . HYPERTENSION 04/02/2007  . OSTEOARTHRITIS, SEVERE 04/07/2007  . 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 WEIGHT GAIN 11/13/2007    SURGICAL HISTORY: Past Surgical History:  Procedure Laterality Date  . APPENDECTOMY    . RIGHT BREAST LUMPECTOMY WITH RADIOACTIVE SEED LOCALIZATION Right 02/08/2016   Performed by Teresa Skates, MD at [facility]  . TONSILLECTOMY    . TOTAL HIP ARTHROPLASTY  1999, left hip    2006 right hip  . TUBAL LIGATION      SOCIAL HISTORY: Social History   Socioeconomic History  . Marital status: Married    Spouse name: Not on file  . Number of children: 2  . Years of education: Not on file  . Highest education level: Not on file  Social Needs  . Financial resource strain: Not on file  . Food insecurity - worry: Not on file  . Food insecurity - inability: Not on file  . Transportation needs - medical: Not on file  . Transportation needs - non-medical: Not on file  Occupational History  . Not on file  Tobacco Use  . Smoking status: Former Smoker    Packs/day: 1.00    Years: 27.00    Pack years: 27.00    Types: Cigarettes    Last attempt to quit: 06/04/1978    Years since quitting: 38.9  . Smokeless tobacco: Never Used  Substance and Sexual Activity  . Alcohol use: Yes    Alcohol/week: 4.2 oz    Types: 7 Glasses of wine per week    Comment: 1-2 glasses wine after dinner, but not necessarily each day  . Drug use: No  . Sexual activity: Not on file  Other Topics Concern  . Not on file  Social History Narrative  . Not on file    FAMILY HISTORY: Family History  Problem Relation Age of Onset   . Pancreatic cancer Mother 31       d. 51  . Cancer Father 52       hodgkin's lymphoma and leukemia; d. 25  . Colon cancer Brother        dx. late 31s; smoker  . Pancreatic cancer Brother 80       d. 64; smoker  . Lung cancer Brother 40       smoker  . Heart attack Brother        d. late 74s  . Colon cancer Paternal Aunt   . Breast cancer Daughter 32       negative genetic testing in 2016  . Skin cancer Daughter        non-melanoma type; +sun exposure  .  Kidney failure Son 1       +EtOH abuse resulting in liver, kidney, and pancreas failure  . Diabetes Maternal Uncle        d. later age  . Heart disease Neg Hx        family  . Esophageal cancer Neg Hx   . Rectal cancer Neg Hx   . Stomach cancer Neg Hx     ALLERGIES:  has No Known Allergies.  MEDICATIONS:  Current Outpatient Medications  Medication Sig Dispense Refill  . anastrozole (ARIMIDEX) 1 MG tablet Take 1 tablet (1 mg total) daily by mouth. 90 tablet 3  . Ascorbic Acid (VITAMIN C) 1000 MG tablet Take 1,000 mg by mouth daily.    Marland Kitchen aspirin EC 81 MG EC tablet Take 1 tablet (81 mg total) by mouth daily. 30 tablet 0  . BIOTIN 5000 PO Take 1 tablet by mouth daily.    . Calcium Carb-Cholecalciferol (CALCIUM 1000 + D PO) Take 1 tablet by mouth daily.    . clobetasol (TEMOVATE) 0.05 % external solution   0  . hydrochlorothiazide (MICROZIDE) 12.5 MG capsule TAKE 1 CAPSULE BY MOUTH DAILY. 30 capsule 3  . ketoconazole (NIZORAL) 2 % shampoo   2  . meloxicam (MOBIC) 7.5 MG tablet Take 1 tablet (7.5 mg total) by mouth daily as needed for pain. 60 tablet 3  . metoprolol tartrate (LOPRESSOR) 25 MG tablet Take 0.5 tablets (12.5 mg total) by mouth 2 (two) times daily. 90 tablet 1  . Multiple Vitamins-Minerals (CENTRUM SILVER ADULT 50+) TABS Take 1 tablet by mouth daily.    Marland Kitchen omega-3 acid ethyl esters (LOVAZA) 1 g capsule Take by mouth 2 (two) times daily.    . sertraline (ZOLOFT) 50 MG tablet TAKE 1 TABLET BY MOUTH DAILY. 90 tablet  3   No current facility-administered medications for this visit.     REVIEW OF SYSTEMS:   Constitutional: Denies fevers, chills, abnormal night sweats or hot flashes Eyes: Denies blurriness of vision, double vision or watery eyes Ears, nose, mouth, throat, and face: Denies mucositis or sore throat Respiratory: Denies cough, dyspnea or wheezes (+) SOB with exertion Cardiovascular: Denies palpitation, chest discomfort or lower extremity swelling Gastrointestinal:  Denies nausea, heartburn or change in bowel habits Skin: Denies abnormal skin rashes Lymphatics: Denies new lymphadenopathy or easy bruising Musculoskeletal:  chronic left hip pain,  (+) mild arthritis in fingers Neurological:Denies numbness, tingling or new weaknesses Behavioral/Psych: Mood is stable, no new changes  All other systems were reviewed with the patient and are negative.  PHYSICAL EXAMINATION: ECOG PERFORMANCE STATUS: 1  Vitals:   04/19/17 1309  BP: (!) 180/74  Pulse: 67  Resp: 18  Temp: (!) 97.3 F (36.3 C)  SpO2: 97%   Filed Weights   04/19/17 1309  Weight: 161 lb (73 kg)    GENERAL:alert, no distress and comfortable SKIN: skin color, texture, turgor are normal, no rashes or significant lesions EYES: normal, conjunctiva are pink and non-injected, sclera clear OROPHARYNX:no exudate, no erythema and lips, buccal mucosa, and tongue normal  NECK: supple, thyroid normal size, non-tender, without nodularity LYMPH:  no palpable lymphadenopathy in the cervical, axillary or inguinal LUNGS: clear to auscultation and percussion with normal breathing effort HEART: regular rate & rhythm and no murmurs and no lower extremity edema ABDOMEN:abdomen soft, non-tender and normal bowel sounds Musculoskeletal:no cyanosis of digits and no clubbing  PSYCH: alert & oriented x 3 with fluent speech NEURO: no focal motor/sensory deficits Breasts: Very well  healed scar with no scar tissue in right breast around aerola, no  other adenopathy or mass appreciated.     LABORATORY DATA:  I have reviewed the data as listed. CBC Latest Ref Rng & Units 04/19/2017 03/06/2017 12/21/2016  WBC 3.9 - 10.3 10e3/uL 8.2 9.7 7.6  Hemoglobin 11.6 - 15.9 g/dL 14.2 14.6 14.2  Hematocrit 34.8 - 46.6 % 43.3 44.5 43.8  Platelets 145 - 400 10e3/uL 193 196.0 189   CMP Latest Ref Rng & Units 04/19/2017 03/06/2017 12/21/2016  Glucose 70 - 140 mg/dl 92 89 95  BUN 7.0 - 26.0 mg/dL 17.4 20 15.4  Creatinine 0.6 - 1.1 mg/dL 0.7 0.73 0.7  Sodium 136 - 145 mEq/L 142 143 140  Potassium 3.5 - 5.1 mEq/L 3.9 3.7 4.1  Chloride 96 - 112 mEq/L - 102 -  CO2 22 - 29 mEq/L 27 33(H) 27  Calcium 8.4 - 10.4 mg/dL 9.7 9.7 9.7  Total Protein 6.4 - 8.3 g/dL 6.9 6.6 6.9  Total Bilirubin 0.20 - 1.20 mg/dL 0.49 0.5 0.45  Alkaline Phos 40 - 150 U/L 198(H) 155(H) 199(H)  AST 5 - 34 U/L _0 ALT 0 - 55 U/L _1 PATHOLOGY REPORT  Diagnosis 01/09/2016 Breast, right, needle core biopsy, 9 o'clock 3 cm fn INVASIVE DUCTAL CARCINOMA, GRADE 1 TO 2  Diagnosis 02/08/2016 Breast, lumpectomy, Right - INVASIVE AND IN SITU DUCTAL CARCINOMA, 1.1 CM. - MARGINS NOT INVOLVED. - FIBROCYSTIC CHANGES WITH CALCIFICATIONS. - LOBULAR NEOPLASIA (ATYPICAL LOBULAR HYPERPLASIA). - PREVIOUS BIOPSY SITE.   RADIOGRAPHIC STUDIES: I have personally reviewed the radiological images as listed and agreed with the findings in the report. No results found.  Her outside mammogram and ultrasound image in report were reviewed.   Bone Density 01/22/17 Normal with lowest T-Score of -0.9 at [facility]   M[facility] 12/20/16 [facility]  Patient reports mammogram was clear  Diagnostic mammogram and ultrasound of right breast 01/05/2016 Breast composition Repeat. There is a new 6 mm irregular high density architecture distortion in the right breast 10:00 middle tabs. It is measured 9 mm meter by ultrasound. Right axillary was negative for adenopathy. The mass is highly suspicious for  malignancy.  ASSESSMENT & PLAN: 83 y.o. Caucasian female, with screening discovered right breast cancer.  1. Breast cancer of upper-outer quadrant of right breast, invasive ductal carcinoma, pT1CN0M0, stage IA, ER+/PR+/HER2- -I previously reviewed her surgical pathology findings with patient in great details. -She has early-stage breast cancer, had a complete surgical resection. -Giving the low Ki-67 and agree 1 disease, I think this is likely a low risk cancer. I do not recommend adjuvant chemotherapy or Oncotype --she has started adjuvant anastrazole the end of September 2017, tolerating well overall, she has developed mild arthralgia especially in her hands, overall stable, likely related to anastrozole, it is manageable, we'll continue for now. I have low threshold to switch her to tamoxifen or stop adjuvant antiestrogen therapy if her arthralgia is much worse, due to her advanced age. -I encourage her to exercise, which will likely help her arthralgia. -We will continue Anastrozole for 5 years total. -She is clinically doing very well, labs reviewed, mild elevated alkaline phosphate, liver enzymes are normal, will continue monitoring, will include GGT on next lab,  if increased significantly on the next lab, I may get a bone scan -Her last mammogram in July 2018 is unremarkable.  Her exam was normal today, no clinical concern for recurrence.  Continue breast cancer surveillance,  2. Genetics -Her daughter was diagnosed with breast cancer at age of 61. -She has strong family history of colon cancer, pancreatic cancer, etc. I'll refer her to genetic counseling to ruled out inch syndrome or other inheritable cancer syndrome. -Her genetic testing was negative  3. HTN -She was on hydrochlorothiazide for hypertension before, which was held when she had a 2 fibrillation. She is on low-dose metoprolol now.  -Her blood pressure has been significantly elevated daily, I strongly encouraged her to  follow-up with her primary care physician as soon as possible  -Her blood pressure has been high in my office, I strongly encouraged her to monitor at home, and contact her primary care physician if remains to be high.  4. Arthralgia -Chronic left hip pain. Follows with orthopedics. Has had several replacements and falls.  -Last fall was several months ago, with no fracture -Taking meloxicam -Joint pain has slightly increased with anastrozole -I have encouraged the patient to stay active as able to prevent joint pain. She will monitor her discomfort and call me if anything changes significantly.  6. Bone health -I previously spoke with the patient about the risk for decreased strength in bones with anastrozole -Encouraged her to begin taking calcium and Vitamin D supplements -01/22/17 DEXA was normal   Plan -Continue anastrozole, I refilled for her today -Lab in 3 months for elevated alkaline phosphatase, if continue increasing, will get a bone scan next time  -Lab and follow-up in 6 months -Flu shot today.   All questions were answered. The patient knows to call the clinic with any problems, questions or concerns. I spent 20 minutes counseling the patient face to face. The total time spent in the appointment was 25 minutes and more than 50% was on counseling.  This document serves as a record of services personally performed by Teresa Merle, MD. It was created on her behalf by Joslyn Devon, a trained medical scribe. The creation of this record is based on the scribe's personal observations and the provider's statements to them.    I have reviewed the above documentation for accuracy and completeness, and I agree with the above.    Teresa Merle, MD 04/19/2017

## 2017-04-19 ENCOUNTER — Other Ambulatory Visit: Payer: Self-pay | Admitting: *Deleted

## 2017-04-19 ENCOUNTER — Telehealth: Payer: Self-pay

## 2017-04-19 ENCOUNTER — Inpatient Hospital Stay (HOSPITAL_BASED_OUTPATIENT_CLINIC_OR_DEPARTMENT_OTHER): Payer: Medicare Other | Admitting: Hematology

## 2017-04-19 ENCOUNTER — Other Ambulatory Visit (HOSPITAL_BASED_OUTPATIENT_CLINIC_OR_DEPARTMENT_OTHER): Payer: Medicare Other

## 2017-04-19 ENCOUNTER — Encounter: Payer: Self-pay | Admitting: Hematology

## 2017-04-19 VITALS — BP 180/74 | HR 67 | Temp 97.3°F | Resp 18 | Ht 63.5 in | Wt 161.0 lb

## 2017-04-19 DIAGNOSIS — C50411 Malignant neoplasm of upper-outer quadrant of right female breast: Secondary | ICD-10-CM

## 2017-04-19 DIAGNOSIS — Z23 Encounter for immunization: Secondary | ICD-10-CM

## 2017-04-19 DIAGNOSIS — I10 Essential (primary) hypertension: Secondary | ICD-10-CM | POA: Diagnosis not present

## 2017-04-19 DIAGNOSIS — Z170 Estrogen receptor positive status [ER+]: Secondary | ICD-10-CM

## 2017-04-19 DIAGNOSIS — Z79811 Long term (current) use of aromatase inhibitors: Secondary | ICD-10-CM

## 2017-04-19 LAB — CBC WITH DIFFERENTIAL/PLATELET
BASO%: 0.6 % (ref 0.0–2.0)
BASOS ABS: 0 10*3/uL (ref 0.0–0.1)
EOS ABS: 0.2 10*3/uL (ref 0.0–0.5)
EOS%: 2.1 % (ref 0.0–7.0)
HCT: 43.3 % (ref 34.8–46.6)
HEMOGLOBIN: 14.2 g/dL (ref 11.6–15.9)
LYMPH%: 31.8 % (ref 14.0–49.7)
MCH: 29.3 pg (ref 25.1–34.0)
MCHC: 32.7 g/dL (ref 31.5–36.0)
MCV: 89.5 fL (ref 79.5–101.0)
MONO#: 0.6 10*3/uL (ref 0.1–0.9)
MONO%: 7.8 % (ref 0.0–14.0)
NEUT%: 57.7 % (ref 38.4–76.8)
NEUTROS ABS: 4.7 10*3/uL (ref 1.5–6.5)
PLATELETS: 193 10*3/uL (ref 145–400)
RBC: 4.84 10*6/uL (ref 3.70–5.45)
RDW: 14.2 % (ref 11.2–14.5)
WBC: 8.2 10*3/uL (ref 3.9–10.3)
lymph#: 2.6 10*3/uL (ref 0.9–3.3)

## 2017-04-19 LAB — COMPREHENSIVE METABOLIC PANEL
ALT: 17 U/L (ref 0–55)
AST: 18 U/L (ref 5–34)
Albumin: 3.8 g/dL (ref 3.5–5.0)
Alkaline Phosphatase: 198 U/L — ABNORMAL HIGH (ref 40–150)
Anion Gap: 9 mEq/L (ref 3–11)
BUN: 17.4 mg/dL (ref 7.0–26.0)
CALCIUM: 9.7 mg/dL (ref 8.4–10.4)
CHLORIDE: 106 meq/L (ref 98–109)
CO2: 27 meq/L (ref 22–29)
CREATININE: 0.7 mg/dL (ref 0.6–1.1)
EGFR: >60 mL/min/{1.73_m2} (ref >60)
Glucose: 92 mg/dl (ref 70–140)
POTASSIUM: 3.9 meq/L (ref 3.5–5.1)
SODIUM: 142 meq/L (ref 136–145)
Total Bilirubin: 0.49 mg/dL (ref 0.20–1.20)
Total Protein: 6.9 g/dL (ref 6.4–8.3)

## 2017-04-19 MED ORDER — ANASTROZOLE 1 MG PO TABS: 1.0000 mg | ORAL_TABLET | Freq: Every day | ORAL | 3 refills | Status: DC

## 2017-04-19 MED ORDER — INFLUENZA VAC SPLIT HIGH-DOSE 0.5 ML IM SUSY
0.5000 mL | PREFILLED_SYRINGE | Freq: Once | INTRAMUSCULAR | Status: AC
  Administered 2017-04-19: 0.5 mL via INTRAMUSCULAR
  Filled 2017-04-19: qty 0.5

## 2017-04-19 NOTE — Telephone Encounter (Signed)
Printed patient avs and cslenderf for upcoming appointment. Per 11/16 los

## 2017-06-28 ENCOUNTER — Emergency Department (HOSPITAL_COMMUNITY): Payer: Medicare Other

## 2017-06-28 ENCOUNTER — Emergency Department (HOSPITAL_COMMUNITY)
Admission: EM | Admit: 2017-06-28 | Discharge: 2017-06-28 | Disposition: A | Discharge status: Home / Self Care | Payer: Medicare Other | Attending: Emergency Medicine | Admitting: Emergency Medicine

## 2017-06-28 ENCOUNTER — Other Ambulatory Visit: Payer: Self-pay

## 2017-06-28 ENCOUNTER — Encounter (HOSPITAL_COMMUNITY): Payer: Self-pay | Admitting: Emergency Medicine

## 2017-06-28 DIAGNOSIS — Z79899 Other long term (current) drug therapy: Secondary | ICD-10-CM | POA: Diagnosis not present

## 2017-06-28 DIAGNOSIS — Z853 Personal history of malignant neoplasm of breast: Secondary | ICD-10-CM | POA: Diagnosis not present

## 2017-06-28 DIAGNOSIS — R531 Weakness: Secondary | ICD-10-CM | POA: Diagnosis not present

## 2017-06-28 DIAGNOSIS — R0789 Other chest pain: Secondary | ICD-10-CM | POA: Diagnosis not present

## 2017-06-28 DIAGNOSIS — I4891 Unspecified atrial fibrillation: Secondary | ICD-10-CM | POA: Diagnosis not present

## 2017-06-28 DIAGNOSIS — Z96643 Presence of artificial hip joint, bilateral: Secondary | ICD-10-CM | POA: Diagnosis not present

## 2017-06-28 DIAGNOSIS — R000 Tachycardia, unspecified: Secondary | ICD-10-CM | POA: Diagnosis not present

## 2017-06-28 DIAGNOSIS — I10 Essential (primary) hypertension: Secondary | ICD-10-CM | POA: Insufficient documentation

## 2017-06-28 DIAGNOSIS — R079 Chest pain, unspecified: Secondary | ICD-10-CM | POA: Diagnosis not present

## 2017-06-28 DIAGNOSIS — Z87891 Personal history of nicotine dependence: Secondary | ICD-10-CM | POA: Diagnosis not present

## 2017-06-28 DIAGNOSIS — Z7901 Long term (current) use of anticoagulants: Secondary | ICD-10-CM | POA: Insufficient documentation

## 2017-06-28 LAB — DIFFERENTIAL
Basophils Absolute: 0 10*3/uL (ref 0.0–0.1)
Basophils Relative: 0 %
EOS ABS: 0 10*3/uL (ref 0.0–0.7)
EOS PCT: 0 %
Lymphocytes Relative: 10 %
Lymphs Abs: 1.4 10*3/uL (ref 0.7–4.0)
Monocytes Absolute: 1.3 10*3/uL — ABNORMAL HIGH (ref 0.1–1.0)
Monocytes Relative: 9 %
NEUTROS PCT: 81 %
Neutro Abs: 11.5 10*3/uL — ABNORMAL HIGH (ref 1.7–7.7)

## 2017-06-28 LAB — HEPATIC FUNCTION PANEL
ALT: 16 U/L (ref 14–54)
AST: 22 U/L (ref 15–41)
Albumin: 3.3 g/dL — ABNORMAL LOW (ref 3.5–5.0)
Alkaline Phosphatase: 127 U/L — ABNORMAL HIGH (ref 38–126)
BILIRUBIN DIRECT: 0.2 mg/dL (ref 0.1–0.5)
BILIRUBIN INDIRECT: 0.8 mg/dL (ref 0.3–0.9)
Total Bilirubin: 1 mg/dL (ref 0.3–1.2)
Total Protein: 6.5 g/dL (ref 6.5–8.1)

## 2017-06-28 LAB — I-STAT TROPONIN, ED
TROPONIN I, POC: 0 ng/mL (ref 0.00–0.08)
TROPONIN I, POC: 0.01 ng/mL (ref 0.00–0.08)

## 2017-06-28 LAB — CBC
HCT: 42.6 % (ref 36.0–46.0)
Hemoglobin: 14.4 g/dL (ref 12.0–15.0)
MCH: 30.9 pg (ref 26.0–34.0)
MCHC: 33.8 g/dL (ref 30.0–36.0)
MCV: 91.4 fL (ref 78.0–100.0)
PLATELETS: 156 10*3/uL (ref 150–400)
RBC: 4.66 MIL/uL (ref 3.87–5.11)
RDW: 13.7 % (ref 11.5–15.5)
WBC: 14.2 10*3/uL — AB (ref 4.0–10.5)

## 2017-06-28 LAB — BASIC METABOLIC PANEL
Anion gap: 13 (ref 5–15)
BUN: 22 mg/dL — ABNORMAL HIGH (ref 6–20)
CHLORIDE: 103 mmol/L (ref 101–111)
CO2: 23 mmol/L (ref 22–32)
CREATININE: 0.86 mg/dL (ref 0.44–1.00)
Calcium: 9.3 mg/dL (ref 8.9–10.3)
GFR calc Af Amer: >60 mL/min (ref >60)
GFR calc non Af Amer: 60 mL/min — ABNORMAL LOW (ref >60)
Glucose, Bld: 135 mg/dL — ABNORMAL HIGH (ref 65–99)
Potassium: 3.5 mmol/L (ref 3.5–5.1)
SODIUM: 139 mmol/L (ref 135–145)

## 2017-06-28 MED ORDER — APIXABAN 5 MG PO TABS
5.0000 mg | ORAL_TABLET | Freq: Once | ORAL | Status: AC
  Administered 2017-06-28: 5 mg via ORAL
  Filled 2017-06-28: qty 1

## 2017-06-28 MED ORDER — METOPROLOL TARTRATE 25 MG PO TABS
12.5000 mg | ORAL_TABLET | Freq: Once | ORAL | Status: AC
  Administered 2017-06-28: 12.5 mg via ORAL
  Filled 2017-06-28: qty 1

## 2017-06-28 MED ORDER — METOPROLOL TARTRATE 25 MG PO TABS: ORAL_TABLET | ORAL | 0 refills | Status: DC

## 2017-06-28 MED ORDER — APIXABAN 5 MG PO TABS: 5.0000 mg | ORAL_TABLET | Freq: Two times a day (BID) | ORAL | 0 refills | Status: DC

## 2017-06-28 MED ORDER — SODIUM CHLORIDE 0.9 % IV BOLUS (SEPSIS)
250.0000 mL | Freq: Once | INTRAVENOUS | Status: AC
  Administered 2017-06-28: 250 mL via INTRAVENOUS

## 2017-06-28 NOTE — ED Provider Notes (Signed)
[facility] EMERGENCY DEPARTMENT Provider Note   CSN: [id number] Arrival date & time: 06/28/17  3295     History   Chief Complaint Chief Complaint  Patient presents with  . Atrial Fibrillation    HPI Teresa Frey is a 84 y.o. female.  Patient states she was having palpitations and chest pressure.  Patient was picked up by the paramedics and found to be in atrial fib.  She improved with Lopressor.  Patient has been on this before and has taken it   The history is provided by the patient.  Atrial Fibrillation  This is a new problem. The current episode started 6 to 12 hours ago. The problem occurs rarely. The problem has been resolved. Associated symptoms include chest pain. Pertinent negatives include no abdominal pain and no headaches. Nothing relieves the symptoms.    Past Medical History:  Diagnosis Date  . ANXIETY 04/07/2007  . Breast cancer of upper-outer quadrant of right female breast ([place]) 01/11/2016  . COLONIC POLYPS, HX OF 04/02/2007   adenomatous  . DIVERTICULITIS, HX OF 04/02/2007  . Diverticulosis   . DJD (degenerative joint disease)   . Hemorrhoids   . HYPERTENSION 04/02/2007  . OSTEOARTHRITIS, SEVERE 04/07/2007  . 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 WEIGHT GAIN 11/13/2007    Patient Active Problem List   Diagnosis Date Noted  . Genetic testing 04/01/2016  . possible sleep apnea by history 02/12/2016  . Atrial fibrillation with RVR ([place]) 02/11/2016  . New onset atrial fibrillation ([place]) 02/11/2016  . Surgical wound infection 02/11/2016  . Breast cancer of upper-outer quadrant of right female breast ([place]) 01/11/2016  . Impaired glucose tolerance 07/28/2014  . ANXIETY 04/07/2007  . Osteoarthritis 04/07/2007  . Essential hypertension 04/02/2007  . History of colonic polyps 04/02/2007  . DIVERTICULITIS, HX OF 04/02/2007    Past Surgical History:  Procedure Laterality Date  . APPENDECTOMY    . BREAST LUMPECTOMY WITH  RADIOACTIVE SEED LOCALIZATION Right 02/08/2016   Procedure: RIGHT BREAST LUMPECTOMY WITH RADIOACTIVE SEED LOCALIZATION;  Surgeon: Fanny Skates, MD;  Location: [facility];  Service: General;  Laterality: Right;  . TONSILLECTOMY    . TOTAL HIP ARTHROPLASTY  1999, left hip    2006 right hip  . TUBAL LIGATION      OB History    No data available       Home Medications    Prior to Admission medications   Medication Sig Start Date End Date Taking? Authorizing Provider  anastrozole (ARIMIDEX) 1 MG tablet Take 1 tablet (1 mg total) daily by mouth. 04/19/17  Yes Truitt Merle, MD  Ascorbic Acid (VITAMIN C) 1000 MG tablet Take 1,000 mg by mouth daily.   Yes [provider]  aspirin EC 81 MG EC tablet Take 1 tablet (81 mg total) by mouth daily. 02/13/16  Yes Hongalgi, Lenis Dickinson, MD  BIOTIN 5000 PO Take 1 tablet by mouth daily.   Yes [provider]  Calcium Carb-Cholecalciferol (CALCIUM 1000 + D PO) Take 1 tablet by mouth daily.   Yes [provider]  hydrochlorothiazide (MICROZIDE) 12.5 MG capsule TAKE 1 CAPSULE BY MOUTH DAILY. Patient taking differently: 12.5mg  by mouth once daily 04/01/17  Yes Marletta Lor, MD  meloxicam (MOBIC) 7.5 MG tablet Take 1 tablet (7.5 mg total) by mouth daily as needed for pain. Patient taking differently: Take 7.5 mg by mouth daily.  03/04/17  Yes Mcarthur Rossetti, MD  Multiple Vitamins-Minerals (CENTRUM SILVER ADULT 50+) TABS Take 1 tablet by mouth daily.   Yes [provider]  omega-3 acid ethyl esters (LOVAZA) 1 g capsule Take 1 g by mouth daily.    Yes [provider]  Phenazopyridine HCl (AZO TABS PO) Take 1 capsule by mouth daily as needed (UTI).   Yes [provider]  sertraline (ZOLOFT) 50 MG tablet TAKE 1 TABLET BY MOUTH DAILY. 11/30/16  Yes Marletta Lor, MD  apixaban (ELIQUIS) 5 MG TABS tablet Take 1 tablet (5 mg total) by mouth 2 (two) times daily. 06/28/17   Milton Ferguson,  MD  metoprolol tartrate (LOPRESSOR) 25 MG tablet Take one half a pill twice a day 06/28/17   Milton Ferguson, MD    Family History Family History  Problem Relation Age of Onset  . Pancreatic cancer Mother 64       d. 52  . Cancer Father 57       hodgkin's lymphoma and leukemia; d. 6  . Colon cancer Brother        dx. late 4s; smoker  . Pancreatic cancer Brother 80       d. 62; smoker  . Lung cancer Brother 40       smoker  . Heart attack Brother        d. late 61s  . Colon cancer Paternal Aunt   . Breast cancer Daughter 40       negative genetic testing in 2016  . Skin cancer Daughter        non-melanoma type; +sun exposure  . Kidney failure Son 63       +EtOH abuse resulting in liver, kidney, and pancreas failure  . Diabetes Maternal Uncle        d. later age  . Heart disease Neg Hx        family  . Esophageal cancer Neg Hx   . Rectal cancer Neg Hx   . Stomach cancer Neg Hx     Social History Social History   Tobacco Use  . Smoking status: Former Smoker    Packs/day: 1.00    Years: 27.00    Pack years: 27.00    Types: Cigarettes    Last attempt to quit: 06/04/1978    Years since quitting: 39.0  . Smokeless tobacco: Never Used  Substance Use Topics  . Alcohol use: Yes    Alcohol/week: 4.2 oz    Types: 7 Glasses of wine per week    Comment: 1-2 glasses wine after dinner, but not necessarily each day  . Drug use: No     Allergies   Patient has no known allergies.   Review of Systems Review of Systems  Constitutional: Negative for appetite change and fatigue.  HENT: Negative for congestion, ear discharge and sinus pressure.   Eyes: Negative for discharge.  Respiratory: Negative for cough.   Cardiovascular: Positive for chest pain.  Gastrointestinal: Negative for abdominal pain and diarrhea.  Genitourinary: Negative for frequency and hematuria.  Musculoskeletal: Negative for back pain.  Skin: Negative for rash.  Neurological: Negative for seizures and  headaches.  Psychiatric/Behavioral: Negative for hallucinations.     Physical Exam Updated Vital Signs BP (!) 115/59   Pulse 69   Temp 98.3 F (36.8 C) (Oral)   Resp (!) 21   Ht 5\' 4"  (1.626 m)   Wt 70.3 kg (155 lb)   SpO2 97%   BMI 26.61 kg/m   Physical Exam  Constitutional: She is  oriented to person, place, and time. She appears well-developed.  HENT:  Head: Normocephalic.  Eyes: Conjunctivae and EOM are normal. No scleral icterus.  Neck: Neck supple. No thyromegaly present.  Cardiovascular: Normal rate and regular rhythm. Exam reveals no gallop and no friction rub.  No murmur heard. Pulmonary/Chest: No stridor. She has no wheezes. She has no rales. She exhibits no tenderness.  Abdominal: She exhibits no distension. There is no tenderness. There is no rebound.  Musculoskeletal: Normal range of motion. She exhibits no edema.  Lymphadenopathy:    She has no cervical adenopathy.  Neurological: She is oriented to person, place, and time. She exhibits normal muscle tone. Coordination normal.  Skin: No rash noted. No erythema.  Psychiatric: She has a normal mood and affect. Her behavior is normal.  Nursing note and vitals reviewed.    ED Treatments / Results  Labs (all labs ordered are listed, but only abnormal results are displayed) Labs Reviewed  BASIC METABOLIC PANEL - Abnormal; Notable for the following components:      Result Value   Glucose, Bld 135 (*)    BUN 22 (*)    GFR calc non Af Amer 60 (*)    All other components within normal limits  CBC - Abnormal; Notable for the following components:   WBC 14.2 (*)    All other components within normal limits  HEPATIC FUNCTION PANEL - Abnormal; Notable for the following components:   Albumin 3.3 (*)    Alkaline Phosphatase 127 (*)    All other components within normal limits  DIFFERENTIAL - Abnormal; Notable for the following components:   Neutro Abs 11.5 (*)    Monocytes Absolute 1.3 (*)    All other components  within normal limits  I-STAT TROPONIN, ED  I-STAT TROPONIN, ED    EKG  EKG Interpretation  Date/Time:  Friday June 28 2017 09:44:46 EST Ventricular Rate:  73 PR Interval:    QRS Duration: 107 QT Interval:  389 QTC Calculation: 429 R Axis:   -53 Text Interpretation:  Sinus rhythm Left anterior fascicular block RSR' in V1 or V2, right VCD or RVH Probable left ventricular hypertrophy Confirmed by Milton Ferguson [phone]) on 06/28/2017 9:59:59 AM Also confirmed by Milton Ferguson [id number])  on 06/28/2017 1:40:15 PM       Radiology Dg Chest 2 View  Result Date: 06/28/2017 CLINICAL DATA:  Chest pain and palpitations for 3 days. EXAM: CHEST  2 VIEW COMPARISON:  02/11/2016 and prior exam FINDINGS: Mild cardiomegaly and peribronchial thickening again noted. There is no evidence of focal airspace disease, pulmonary edema, suspicious pulmonary nodule/mass, pleural effusion, or pneumothorax. No acute bony abnormalities are identified. Right breast surgical clips again noted. IMPRESSION: 1. No evidence of acute cardiopulmonary disease 2. Cardiomegaly and probable COPD changes. Electronically Signed   By: Margarette Canada M.D.   On: 06/28/2017 10:30    Procedures Procedures (including critical care time)  Medications Ordered in ED Medications  sodium chloride 0.9 % bolus 250 mL (0 mLs Intravenous Stopped 06/28/17 1229)  apixaban (ELIQUIS) tablet 5 mg (5 mg Oral Given 06/28/17 1458)  metoprolol tartrate (LOPRESSOR) tablet 12.5 mg (12.5 mg Oral Given 06/28/17 1442)     Initial Impression / Assessment and Plan / ED Course  I have reviewed the triage vital signs and the nursing notes.  Pertinent labs & imaging results that were available during my care of the patient were reviewed by me and considered in my medical decision making (see chart for details).  Patient had atrial fib today but now is in sinus rhythm.  I spoke with cardiology and will we will place the patient on Lopressor again.  She will  be placed on 12.5 mg twice a day and also Eliquis 5 mg twice a day.  She will stop her aspirin and her Mobic and follow-up with cardiology next week  Final Clinical Impressions(s) / ED Diagnoses   Final diagnoses:  Atrial fibrillation with RVR [facility])    ED Discharge Orders        Ordered    metoprolol tartrate (LOPRESSOR) 25 MG tablet     06/28/17 1510    apixaban (ELIQUIS) 5 MG TABS tablet  2 times daily     06/28/17 1510       Milton Ferguson, MD 06/28/17 1519

## 2017-06-28 NOTE — Discharge Instructions (Signed)
Stop taking your aspirin and your Mobic.  Follow-up with cardiology next week also follow-up with your family doctor

## 2017-06-28 NOTE — ED Notes (Signed)
Patient verbalizes understanding of discharge instructions. Opportunity for questioning and answers were provided. Armband removed by staff, pt discharged from ED ambulatory.   

## 2017-06-28 NOTE — ED Notes (Signed)
Patient transported to X-ray 

## 2017-06-28 NOTE — ED Notes (Signed)
Care handoff to Megan RN 

## 2017-06-28 NOTE — ED Notes (Signed)
ED Provider at bedside. 

## 2017-06-28 NOTE — ED Triage Notes (Signed)
PT arrives via EMS for complaint of weakness, chest heaviness, and palpitations for 3-4 days. PT reports she was admitted for afib 1.5 years ago. She was not started on any new meds by cardiology per PT.   PT also reports low grade fever and loss of appetite.   Denies SOB, chest pain during triage. Endorses slight nausea.  PT was given 5mg  metoprolol IV by EMS in route. PT's heart rate came down to 70 bpm and then increased to 110s. This was captured on EKG strip. Highest heart rate witnessed by EMS was in [place]

## 2017-07-04 ENCOUNTER — Ambulatory Visit (HOSPITAL_COMMUNITY)
Admission: RE | Admit: 2017-07-04 | Discharge: 2017-07-04 | Disposition: A | Discharge status: Home / Self Care | Payer: Medicare Other | Source: Ambulatory Visit | Attending: Nurse Practitioner | Admitting: Nurse Practitioner

## 2017-07-04 ENCOUNTER — Encounter (HOSPITAL_COMMUNITY): Payer: Self-pay | Admitting: Nurse Practitioner

## 2017-07-04 VITALS — BP 130/64 | HR 57 | Ht 64.0 in | Wt 157.8 lb

## 2017-07-04 DIAGNOSIS — Z808 Family history of malignant neoplasm of other organs or systems: Secondary | ICD-10-CM | POA: Insufficient documentation

## 2017-07-04 DIAGNOSIS — I10 Essential (primary) hypertension: Secondary | ICD-10-CM | POA: Diagnosis not present

## 2017-07-04 DIAGNOSIS — I480 Paroxysmal atrial fibrillation: Secondary | ICD-10-CM | POA: Diagnosis not present

## 2017-07-04 DIAGNOSIS — Z800 Family history of malignant neoplasm of digestive organs: Secondary | ICD-10-CM | POA: Insufficient documentation

## 2017-07-04 DIAGNOSIS — Z7901 Long term (current) use of anticoagulants: Secondary | ICD-10-CM | POA: Insufficient documentation

## 2017-07-04 DIAGNOSIS — F419 Anxiety disorder, unspecified: Secondary | ICD-10-CM | POA: Insufficient documentation

## 2017-07-04 DIAGNOSIS — R0683 Snoring: Secondary | ICD-10-CM | POA: Diagnosis not present

## 2017-07-04 DIAGNOSIS — Z802 Family history of malignant neoplasm of other respiratory and intrathoracic organs: Secondary | ICD-10-CM | POA: Diagnosis not present

## 2017-07-04 DIAGNOSIS — Z79899 Other long term (current) drug therapy: Secondary | ICD-10-CM | POA: Diagnosis not present

## 2017-07-04 DIAGNOSIS — Z96641 Presence of right artificial hip joint: Secondary | ICD-10-CM | POA: Diagnosis not present

## 2017-07-04 DIAGNOSIS — Z853 Personal history of malignant neoplasm of breast: Secondary | ICD-10-CM | POA: Insufficient documentation

## 2017-07-04 DIAGNOSIS — R001 Bradycardia, unspecified: Secondary | ICD-10-CM | POA: Diagnosis not present

## 2017-07-04 DIAGNOSIS — I4510 Unspecified right bundle-branch block: Secondary | ICD-10-CM | POA: Insufficient documentation

## 2017-07-04 DIAGNOSIS — I4891 Unspecified atrial fibrillation: Secondary | ICD-10-CM | POA: Diagnosis not present

## 2017-07-04 DIAGNOSIS — Z801 Family history of malignant neoplasm of trachea, bronchus and lung: Secondary | ICD-10-CM | POA: Diagnosis not present

## 2017-07-04 DIAGNOSIS — Z87891 Personal history of nicotine dependence: Secondary | ICD-10-CM | POA: Insufficient documentation

## 2017-07-04 NOTE — Progress Notes (Signed)
Primary Care Physician: Marletta Lor, MD Referring Physician: [facility] ER   Teresa Frey is a 84 y.o. female with a h/o paroxysmal afib in fall of 2017. This was in the setting of OTC decongestants. She was started on lopressor and asa. At some point, decongestants were stopped. Pt did very well staying in SR for a long time but it  started back last Friday and presented to the ER. She went back to SR with lopressor. She was started on eliquis 5 mg bid for chadscore of 4. She does not smoke, minimal caffeine, minimal alcohol. She snores but does not seem to  have daytime somnolence/apnea spells. 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  Today, she denies symptoms of palpitations, chest pain, shortness of breath, orthopnea, PND, lower extremity edema, dizziness, presyncope, syncope, or neurologic sequela. The patient is tolerating medications without difficulties and is otherwise without complaint today.   Past Medical History:  Diagnosis Date  . ANXIETY 04/07/2007  . Breast cancer of upper-outer quadrant of right female breast ([place]) 01/11/2016  . COLONIC POLYPS, HX OF 04/02/2007   adenomatous  . DIVERTICULITIS, HX OF 04/02/2007  . Diverticulosis   . DJD (degenerative joint disease)   . Hemorrhoids   . HYPERTENSION 04/02/2007  . OSTEOARTHRITIS, SEVERE 04/07/2007  . 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 WEIGHT GAIN 11/13/2007   Past Surgical History:  Procedure Laterality Date  . APPENDECTOMY    . BREAST LUMPECTOMY WITH RADIOACTIVE SEED LOCALIZATION Right 02/08/2016   Procedure: RIGHT BREAST LUMPECTOMY WITH RADIOACTIVE SEED LOCALIZATION;  Surgeon: Fanny Skates, MD;  Location: [facility];  Service: General;  Laterality: Right;  . TONSILLECTOMY    . TOTAL HIP ARTHROPLASTY  1999, left hip    2006 right hip  . TUBAL LIGATION      Current Outpatient Medications  Medication Sig  Dispense Refill  . anastrozole (ARIMIDEX) 1 MG tablet Take 1 tablet (1 mg total) daily by mouth. 90 tablet 3  . apixaban (ELIQUIS) 5 MG TABS tablet Take 1 tablet (5 mg total) by mouth 2 (two) times daily. 60 tablet 0  . Ascorbic Acid (VITAMIN C) 1000 MG tablet Take 1,000 mg by mouth daily.    Marland Kitchen BIOTIN 5000 PO Take 1 tablet by mouth daily.    . Calcium Carb-Cholecalciferol (CALCIUM 1000 + D PO) Take 1 tablet by mouth daily.    . hydrochlorothiazide (MICROZIDE) 12.5 MG capsule TAKE 1 CAPSULE BY MOUTH DAILY. (Patient taking differently: 12.5mg  by mouth once daily) 30 capsule 3  . metoprolol tartrate (LOPRESSOR) 25 MG tablet Take one half a pill twice a day 30 tablet 0  . Multiple Vitamins-Minerals (CENTRUM SILVER ADULT 50+) TABS Take 1 tablet by mouth daily.    Marland Kitchen omega-3 acid ethyl esters (LOVAZA) 1 g capsule Take 1 g by mouth daily.     . Phenazopyridine HCl (AZO TABS PO) Take 1 capsule by mouth daily as needed (UTI).    Marland Kitchen sertraline (ZOLOFT) 50 MG tablet TAKE 1 TABLET BY MOUTH DAILY. 90 tablet 3   No current facility-administered medications for this encounter.     No Known Allergies  Social History   Socioeconomic History  . Marital status: Married    Spouse name: Not on file  . Number of children: 2  . Years of education: Not on file  . Highest  education level: Not on file  Social Needs  . Financial resource strain: Not on file  . Food insecurity - worry: Not on file  . Food insecurity - inability: Not on file  . Transportation needs - medical: Not on file  . Transportation needs - non-medical: Not on file  Occupational History  . Not on file  Tobacco Use  . Smoking status: Former Smoker    Packs/day: 1.00    Years: 27.00    Pack years: 27.00    Types: Cigarettes    Last attempt to quit: 06/04/1978    Years since quitting: 39.1  . Smokeless tobacco: Never Used  Substance and Sexual Activity  . Alcohol use: Yes    Alcohol/week: 4.2 oz    Types: 7 Glasses of wine per week      Comment: 1-2 glasses wine after dinner, but not necessarily each day  . Drug use: No  . Sexual activity: Not on file  Other Topics Concern  . Not on file  Social History Narrative  . Not on file    Family History  Problem Relation Age of Onset  . Pancreatic cancer Mother 49       d. 35  . Cancer Father 36       hodgkin's lymphoma and leukemia; d. 75  . Colon cancer Brother        dx. late 28s; smoker  . Pancreatic cancer Brother 80       d. 26; smoker  . Lung cancer Brother 40       smoker  . Heart attack Brother        d. late 30s  . Colon cancer Paternal Aunt   . Breast cancer Daughter 53       negative genetic testing in 2016  . Skin cancer Daughter        non-melanoma type; +sun exposure  . Kidney failure Son 64       +EtOH abuse resulting in liver, kidney, and pancreas failure  . Diabetes Maternal Uncle        d. later age  . Heart disease Neg Hx        family  . Esophageal cancer Neg Hx   . Rectal cancer Neg Hx   . Stomach cancer Neg Hx     ROS- All systems are reviewed and negative except as per the HPI above  Physical Exam: Vitals:   07/04/17 0902  BP: 130/64  Pulse: (!) 57  Weight: 157 lb 12.8 oz (71.6 kg)  Height: 5\' 4"  (1.626 m)   Wt Readings from Last 3 Encounters:  07/04/17 157 lb 12.8 oz (71.6 kg)  06/28/17 155 lb (70.3 kg)  04/19/17 161 lb (73 kg)    Labs: Lab Results  Component Value Date   NA 139 06/28/2017   K 3.5 06/28/2017   CL 103 06/28/2017   CO2 23 06/28/2017   GLUCOSE 135 (H) 06/28/2017   BUN 22 (H) 06/28/2017   CREATININE 0.86 06/28/2017   CALCIUM 9.3 06/28/2017   No results found for: INR Lab Results  Component Value Date   CHOL 200 10/06/2015   HDL 53.20 10/06/2015   LDLCALC 121 (H) 10/06/2015   TRIG 133.0 10/06/2015     GEN- The patient is well appearing, alert and oriented x 3 today.   Head- normocephalic, atraumatic Eyes-  Sclera clear, conjunctiva pink Ears- hearing intact Oropharynx- clear Neck-  supple, no JVP Lymph- no cervical lymphadenopathy Lungs- Clear to ausculation bilaterally,  normal work of breathing Heart- Regular rate and rhythm, no murmurs, rubs or gallops, PMI not laterally displaced GI- soft, NT, ND, + BS Extremities- no clubbing, cyanosis, or edema MS- no significant deformity or atrophy Skin- no rash or lesion Psych- euthymic mood, full affect Neuro- strength and sensation are intact  EKG- Sinus brady at 57 bpm, pr int 158 ms, qrs int 102 ms, qtc 391 ms Epic records reviewed    Assessment and Plan: 1. Paroxysmal atrial fibrillation In SR at 57 bpm General education re afib Common triggers discussed Continue lopressor  25 mg bid Echo done in fall 2017 and WNL  2. Chadsvasc score of 4 Continue Eliquis at 5 mg bid  Bleeding precautions discussed  Does not have Medicare D and assistance papers were given to pt to see if she qualifies for assistance   F/u with Jana Half Dr. Percival Spanish in 4-6 weeks  Geroge Baseman. Aneesa Romey, [facility] [address] [place], [place] [address] [phone]

## 2017-07-19 ENCOUNTER — Inpatient Hospital Stay: Payer: Medicare Other | Attending: Hematology

## 2017-07-19 DIAGNOSIS — Z79811 Long term (current) use of aromatase inhibitors: Secondary | ICD-10-CM | POA: Diagnosis not present

## 2017-07-19 DIAGNOSIS — C50411 Malignant neoplasm of upper-outer quadrant of right female breast: Secondary | ICD-10-CM | POA: Diagnosis not present

## 2017-07-19 DIAGNOSIS — Z170 Estrogen receptor positive status [ER+]: Secondary | ICD-10-CM | POA: Insufficient documentation

## 2017-07-19 LAB — GAMMA GT: GGT: 15 U/L (ref 7–50)

## 2017-07-24 ENCOUNTER — Telehealth: Payer: Self-pay | Admitting: *Deleted

## 2017-07-24 NOTE — Telephone Encounter (Signed)
TCT patient regarding lab results from 5 days ago (GGT). Spoke with patient and informed her the results were normal and that elevated Alk. Phos has returned to near normal. Pt verbalized understanding. She states she feels well and has no further questions or concerns. She is aware of her f/u appt with Dr. Burr Medico in May 2019.

## 2017-07-30 ENCOUNTER — Other Ambulatory Visit: Payer: Self-pay | Admitting: *Deleted

## 2017-07-30 MED ORDER — METOPROLOL TARTRATE 25 MG PO TABS: ORAL_TABLET | ORAL | 1 refills | Status: DC

## 2017-08-01 ENCOUNTER — Other Ambulatory Visit: Payer: Self-pay | Admitting: Internal Medicine

## 2017-08-01 ENCOUNTER — Other Ambulatory Visit: Payer: Self-pay | Admitting: *Deleted

## 2017-08-01 MED ORDER — APIXABAN 5 MG PO TABS: 5.0000 mg | ORAL_TABLET | Freq: Two times a day (BID) | ORAL | 0 refills | Status: DC

## 2017-08-02 ENCOUNTER — Telehealth: Payer: Self-pay | Admitting: Cardiology

## 2017-08-02 NOTE — Telephone Encounter (Signed)
New message      Fax [phone] - patient at pharmacy now her discount card expired and needs a new one faxed over to cover her eliquis,   Thank you

## 2017-08-02 NOTE — Telephone Encounter (Signed)
Mailed card to patient.

## 2017-08-02 NOTE — Telephone Encounter (Signed)
Pharmacy received copy of eliquis saving card

## 2017-08-07 NOTE — Progress Notes (Signed)
Cardiology Office Note   Date:  08/08/2017   ID:  Teresa Frey, DOB August 31, 1929, MRN [id number]  PCP:  Teresa Lor, MD  Cardiologist:   No primary care provider on file.   No chief complaint on file.     History of Present Illness: Teresa Frey is a 84 y.o. female who presents for follow up of atrial fib.  She has been in the [facility] Clinic for treatment of paroxysmal atrial fib.   She had a paroxysm in January.  When she feels this and feels like she is tired and she might have some increased shortness of breath.  She does not necessarily feel the palpitations.  At the first episode she felt was brought on by emotional stress but is not clearly second episode.  She still works full-time and has significant debt from her husbands protracted i she does not have any presyncope or syncope.  She has not beenllness.  Out of rhythm as far she knows since the January episode. The patient denies any new symptoms such as chest discomfort, neck or arm discomfort. There has been no new shortness of breath, PND or orthopnea. There have been no reported palpitations, presyncope or syncope.       Past Medical History:  Diagnosis Date  . ANXIETY 04/07/2007  . Breast cancer of upper-outer quadrant of right female breast ([place]) 01/11/2016  . COLONIC POLYPS, HX OF 04/02/2007   adenomatous  . DIVERTICULITIS, HX OF 04/02/2007  . Diverticulosis   . DJD (degenerative joint disease)   . Hemorrhoids   . HYPERTENSION 04/02/2007  . OSTEOARTHRITIS, SEVERE 04/07/2007  . 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 WEIGHT GAIN 11/13/2007    Past Surgical History:  Procedure Laterality Date  . APPENDECTOMY    . BREAST LUMPECTOMY WITH RADIOACTIVE SEED LOCALIZATION Right 02/08/2016   Procedure: RIGHT BREAST LUMPECTOMY WITH RADIOACTIVE SEED LOCALIZATION;  Surgeon: Fanny Skates, MD;  Location: [facility];  Service: General;  Laterality: Right;  . TONSILLECTOMY    . TOTAL HIP ARTHROPLASTY   1999, left hip    2006 right hip  . TUBAL LIGATION       Current Outpatient Medications  Medication Sig Dispense Refill  . anastrozole (ARIMIDEX) 1 MG tablet Take 1 tablet (1 mg total) daily by mouth. 90 tablet 3  . apixaban (ELIQUIS) 5 MG TABS tablet Take 1 tablet (5 mg total) by mouth 2 (two) times daily. NEXT REFILL AUTHORIZATION AFTER F/U WITH CARDIOLOGIST 60 tablet 0  . Ascorbic Acid (VITAMIN C) 1000 MG tablet Take 1,000 mg by mouth daily.    Marland Kitchen BIOTIN 5000 PO Take 1 tablet by mouth daily.    . Calcium Carb-Cholecalciferol (CALCIUM 1000 + D PO) Take 1 tablet by mouth daily.    . hydrochlorothiazide (MICROZIDE) 12.5 MG capsule TAKE 1 CAPSULE BY MOUTH DAILY. 30 capsule 3  . metoprolol tartrate (LOPRESSOR) 25 MG tablet KEEP OV. 30 tablet 1  . Multiple Vitamins-Minerals (CENTRUM SILVER ADULT 50+) TABS Take 1 tablet by mouth daily.    Marland Kitchen omega-3 acid ethyl esters (LOVAZA) 1 g capsule Take 1 g by mouth daily.     . Phenazopyridine HCl (AZO TABS PO) Take 1 capsule by mouth daily as needed (UTI).    Marland Kitchen sertraline (ZOLOFT) 50 MG tablet TAKE 1 TABLET BY MOUTH DAILY. 90 tablet 3   No current facility-administered medications for this visit.     Allergies:   Patient  has no known allergies.    ROS:  Please see the history of present illness.   Otherwise, review of systems are positive for none.   All other systems are reviewed and negative.    PHYSICAL EXAM: VS:  BP (!) 122/52   Pulse (!) 54   Ht 5\' 3"  (1.6 m)   Wt 158 lb (71.7 kg)   SpO2 95%   BMI 27.99 kg/m  , BMI Body mass index is 27.99 kg/m. GENERAL:  Well appearing NECK:  No jugular venous distention, waveform within normal limits, carotid upstroke brisk and symmetric, no bruits, no thyromegaly LUNGS:  Clear to auscultation bilaterally BACK:  No CVA tenderness CHEST:  Unremarkable HEART:  PMI not displaced or sustained,S1 and S2 within normal limits, no S3, no S4, no clicks, no rubs, no murmurs ABD:  Flat, positive bowel  sounds normal in frequency in pitch, no bruits, no rebound, no guarding, no midline pulsatile mass, no hepatomegaly, no splenomegaly EXT:  2 plus pulses upper and decreased DP/PT bilateral lower, no edema, no cyanosis no clubbing    EKG:  EKG is not ordered today.    Recent Labs: 03/06/2017: TSH 1.66 06/28/2017: ALT 16; BUN 22; Creatinine, Ser 0.86; Hemoglobin 14.4; Platelets 156; Potassium 3.5; Sodium 139    Lipid Panel    Component Value Date/Time   CHOL 200 10/06/2015 0846   TRIG 133.0 10/06/2015 0846   TRIG 109 04/02/2006 1019   HDL 53.20 10/06/2015 0846   CHOLHDL 4 10/06/2015 0846   VLDL 26.6 10/06/2015 0846   LDLCALC 121 (H) 10/06/2015 0846   LDLDIRECT 126.1 06/12/2013 0949      Wt Readings from Last 3 Encounters:  08/08/17 158 lb (71.7 kg)  07/04/17 157 lb 12.8 oz (71.6 kg)  06/28/17 155 lb (70.3 kg)      Other studies Reviewed: Additional studies/ records that were reviewed today include: ED records and [facility] records. Review of the above records demonstrates:  Please see elsewhere in the note.     ASSESSMENT AND PLAN:   Atrial Fib:  Ms. [name] has a CHA2DS2 - VASc score of 4.   Unfortunately she cannot afford the Eliquis and she did not qualify for assistance.  She is only had 2 paroxysms I do not think that consideration of an antiarrhythmic is needed at this point.  Of note she had an essentially unremarkable echo in 2017 and I did review this result.  TSH in the fall was normal.    We will going to have our pharmacist talked to her about switching to warfarin.  She will remain on the beta-blocker for rate control.  Our pharmacist came in to talk to the patient and gave her plans for changing to warfarin.     (Greater than 25 minutes reviewing all data with greater than 50% face to face with the patient).     Current medicines are reviewed at length with the patient today.  The patient has concerns regarding medicines.  The following changes have  been made:  See above  Labs/ tests ordered today include: None No orders of the defined types were placed in this encounter.    Disposition:   FU with APP in six months.     Signed, [name], MD  08/08/2017 7:53 AM     [facility]

## 2017-08-08 ENCOUNTER — Ambulatory Visit (INDEPENDENT_AMBULATORY_CARE_PROVIDER_SITE_OTHER): Payer: Medicare Other | Admitting: Cardiology

## 2017-08-08 ENCOUNTER — Encounter: Payer: Self-pay | Admitting: Cardiology

## 2017-08-08 VITALS — BP 122/52 | HR 54 | Ht 63.0 in | Wt 158.0 lb

## 2017-08-08 DIAGNOSIS — I480 Paroxysmal atrial fibrillation: Secondary | ICD-10-CM

## 2017-08-08 MED ORDER — WARFARIN SODIUM 5 MG PO TABS: 5.0000 mg | ORAL_TABLET | Freq: Every day | ORAL | 3 refills | Status: DC

## 2017-08-08 NOTE — Patient Instructions (Addendum)
Medication Instructions:  STOP- Eliquis  START- Warfarin 5 mg daily  If you need a refill on your cardiac medications before your next appointment, please call your pharmacy.  Labwork: None Ordered   Testing/Procedures: None Ordered  Follow-Up: Your physician wants you to follow-up in: 6 Months. You should receive a reminder letter in the mail two months in advance. If you do not receive a letter, please call our office [phone].    Thank you for choosing [facility] at [facility]!!

## 2017-08-09 ENCOUNTER — Ambulatory Visit: Payer: Medicare Other | Admitting: Cardiology

## 2017-08-14 ENCOUNTER — Ambulatory Visit (INDEPENDENT_AMBULATORY_CARE_PROVIDER_SITE_OTHER): Payer: Medicare Other | Admitting: Pharmacist Clinician (PhC)/ Clinical Pharmacy Specialist

## 2017-08-14 DIAGNOSIS — Z7901 Long term (current) use of anticoagulants: Secondary | ICD-10-CM | POA: Diagnosis not present

## 2017-08-14 DIAGNOSIS — Z170 Estrogen receptor positive status [ER+]: Secondary | ICD-10-CM

## 2017-08-14 DIAGNOSIS — I4891 Unspecified atrial fibrillation: Secondary | ICD-10-CM | POA: Diagnosis not present

## 2017-08-14 DIAGNOSIS — C50411 Malignant neoplasm of upper-outer quadrant of right female breast: Secondary | ICD-10-CM

## 2017-08-14 LAB — POCT INR: INR: 1.2

## 2017-08-21 ENCOUNTER — Ambulatory Visit (INDEPENDENT_AMBULATORY_CARE_PROVIDER_SITE_OTHER): Payer: Medicare Other | Admitting: Pharmacist

## 2017-08-21 DIAGNOSIS — I4891 Unspecified atrial fibrillation: Secondary | ICD-10-CM

## 2017-08-21 DIAGNOSIS — Z7901 Long term (current) use of anticoagulants: Secondary | ICD-10-CM

## 2017-08-21 LAB — POCT INR: INR: 1.1

## 2017-08-28 ENCOUNTER — Ambulatory Visit (INDEPENDENT_AMBULATORY_CARE_PROVIDER_SITE_OTHER): Payer: Medicare Other | Admitting: Pharmacist

## 2017-08-28 DIAGNOSIS — I4891 Unspecified atrial fibrillation: Secondary | ICD-10-CM | POA: Diagnosis not present

## 2017-08-28 DIAGNOSIS — Z7901 Long term (current) use of anticoagulants: Secondary | ICD-10-CM | POA: Diagnosis not present

## 2017-08-28 LAB — POCT INR: INR: 1.2

## 2017-09-05 ENCOUNTER — Ambulatory Visit (INDEPENDENT_AMBULATORY_CARE_PROVIDER_SITE_OTHER): Payer: Medicare Other | Admitting: Pharmacist Clinician (PhC)/ Clinical Pharmacy Specialist

## 2017-09-05 ENCOUNTER — Ambulatory Visit (INDEPENDENT_AMBULATORY_CARE_PROVIDER_SITE_OTHER): Payer: Medicare Other | Admitting: Internal Medicine

## 2017-09-05 ENCOUNTER — Encounter: Payer: Self-pay | Admitting: Internal Medicine

## 2017-09-05 VITALS — BP 126/72 | HR 64 | Temp 98.6°F | Wt 159.0 lb

## 2017-09-05 DIAGNOSIS — J219 Acute bronchiolitis, unspecified: Secondary | ICD-10-CM

## 2017-09-05 DIAGNOSIS — J209 Acute bronchitis, unspecified: Secondary | ICD-10-CM | POA: Diagnosis not present

## 2017-09-05 DIAGNOSIS — I10 Essential (primary) hypertension: Secondary | ICD-10-CM

## 2017-09-05 DIAGNOSIS — Z7901 Long term (current) use of anticoagulants: Secondary | ICD-10-CM

## 2017-09-05 DIAGNOSIS — I4891 Unspecified atrial fibrillation: Secondary | ICD-10-CM

## 2017-09-05 LAB — POCT INR: INR: 1.3

## 2017-09-05 NOTE — Progress Notes (Signed)
Subjective:    Patient ID: Teresa Frey, female    DOB: 09/25/1929, 84 y.o.   MRN: [id number]  HPI 84 year old patient who is a prior tobacco user who has been treated for episodic bronchitis over the years. She presents today with a 1 day history of increasing cough congestion.  She complains of some yellow sputum production.  No fever chills or shortness of breath.  She is requesting a Z-Pak. She has a history of atrial fibrillation and now is on Coumadin anticoagulation. She has essential hypertension which has been well controlled  Past Medical History:  Diagnosis Date  . ANXIETY 04/07/2007  . Breast cancer of upper-outer quadrant of right female breast ([place]) 01/11/2016  . COLONIC POLYPS, HX OF 04/02/2007   adenomatous  . DIVERTICULITIS, HX OF 04/02/2007  . Diverticulosis   . DJD (degenerative joint disease)   . Hemorrhoids   . HYPERTENSION 04/02/2007  . OSTEOARTHRITIS, SEVERE 04/07/2007  . 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 WEIGHT GAIN 11/13/2007     Social History   Socioeconomic History  . Marital status: Married    Spouse name: Not on file  . Number of children: 2  . Years of education: Not on file  . Highest education level: Not on file  Occupational History  . Not on file  Social Needs  . Financial resource strain: Not on file  . Food insecurity:    Worry: Not on file    Inability: Not on file  . Transportation needs:    Medical: Not on file    Non-medical: Not on file  Tobacco Use  . Smoking status: Former Smoker    Packs/day: 1.00    Years: 27.00    Pack years: 27.00    Types: Cigarettes    Last attempt to quit: 06/04/1978    Years since quitting: 39.2  . Smokeless tobacco: Never Used  Substance and Sexual Activity  . Alcohol use: Yes    Alcohol/week: 4.2 oz    Types: 7 Glasses of wine per week    Comment: 1-2 glasses wine after dinner, but not necessarily each day  . Drug use: No  . Sexual activity: Not on file  Lifestyle  . Physical  activity:    Days per week: Not on file    Minutes per session: Not on file  . Stress: Not on file  Relationships  . Social connections:    Talks on phone: Not on file    Gets together: Not on file    Attends religious service: Not on file    Active member of club or organization: Not on file    Attends meetings of clubs or organizations: Not on file    Relationship status: Not on file  . Intimate partner violence:    Fear of current or ex partner: Not on file    Emotionally abused: Not on file    Physically abused: Not on file    Forced sexual activity: Not on file  Other Topics Concern  . Not on file  Social History Narrative  . Not on file    Past Surgical History:  Procedure Laterality Date  . APPENDECTOMY    . BREAST LUMPECTOMY WITH RADIOACTIVE SEED LOCALIZATION Right 02/08/2016   Procedure: RIGHT BREAST LUMPECTOMY WITH RADIOACTIVE SEED LOCALIZATION;  Surgeon: Fanny Skates, MD;  Location: [facility];  Service: General;  Laterality: Right;  . TONSILLECTOMY    . TOTAL HIP ARTHROPLASTY  1999, left hip    2006 right hip  . TUBAL LIGATION      Family History  Problem Relation Age of Onset  . Pancreatic cancer Mother 53       d. 43  . Cancer Father 33       hodgkin's lymphoma and leukemia; d. 19  . Colon cancer Brother        dx. late 7s; smoker  . Pancreatic cancer Brother 80       d. 28; smoker  . Lung cancer Brother 40       smoker  . Heart attack Brother        d. late 60s  . Colon cancer Paternal Aunt   . Breast cancer Daughter 56       negative genetic testing in 2016  . Skin cancer Daughter        non-melanoma type; +sun exposure  . Kidney failure Son 39       +EtOH abuse resulting in liver, kidney, and pancreas failure  . Diabetes Maternal Uncle        d. later age  . Heart disease Neg Hx        family  . Esophageal cancer Neg Hx   . Rectal cancer Neg Hx   . Stomach cancer Neg Hx     No Known Allergies  Current Outpatient  Medications on File Prior to Visit  Medication Sig Dispense Refill  . anastrozole (ARIMIDEX) 1 MG tablet Take 1 tablet (1 mg total) daily by mouth. 90 tablet 3  . Ascorbic Acid (VITAMIN C) 1000 MG tablet Take 1,000 mg by mouth daily.    Marland Kitchen BIOTIN 5000 PO Take 1 tablet by mouth daily.    . Calcium Carb-Cholecalciferol (CALCIUM 1000 + D PO) Take 1 tablet by mouth daily.    . hydrochlorothiazide (MICROZIDE) 12.5 MG capsule TAKE 1 CAPSULE BY MOUTH DAILY. 30 capsule 3  . metoprolol tartrate (LOPRESSOR) 25 MG tablet KEEP OV. 30 tablet 1  . Multiple Vitamins-Minerals (CENTRUM SILVER ADULT 50+) TABS Take 1 tablet by mouth daily.    Marland Kitchen omega-3 acid ethyl esters (LOVAZA) 1 g capsule Take 1 g by mouth daily.     . Phenazopyridine HCl (AZO TABS PO) Take 1 capsule by mouth daily as needed (UTI).    Marland Kitchen sertraline (ZOLOFT) 50 MG tablet TAKE 1 TABLET BY MOUTH DAILY. 90 tablet 3  . warfarin (COUMADIN) 5 MG tablet Take 1 tablet (5 mg total) by mouth daily. 90 tablet 3   No current facility-administered medications on file prior to visit.     BP 126/72 (BP Location: Right Arm, Patient Position: Sitting, Cuff Size: Normal)   Pulse 64   Temp 98.6 F (37 C) (Oral)   Wt 159 lb (72.1 kg)   SpO2 93%   BMI 28.17 kg/m      Review of Systems  Constitutional: Positive for activity change, appetite change and fatigue.  HENT: Negative for congestion, dental problem, hearing loss, rhinorrhea, sinus pressure, sore throat and tinnitus.   Eyes: Negative for pain, discharge and visual disturbance.  Respiratory: Positive for cough. Negative for shortness of breath.   Cardiovascular: Negative for chest pain, palpitations and leg swelling.  Gastrointestinal: Negative for abdominal distention, abdominal pain, blood in stool, constipation, diarrhea, nausea and vomiting.  Genitourinary: Negative for difficulty urinating, dysuria, flank pain, frequency, hematuria, pelvic pain, urgency, vaginal bleeding, vaginal discharge and  vaginal pain.  Musculoskeletal: Negative for arthralgias, gait problem and joint swelling.  Skin: Negative for rash.  Neurological: Negative for dizziness, syncope, speech difficulty, weakness, numbness and headaches.  Hematological: Negative for adenopathy.  Psychiatric/Behavioral: Negative for agitation, behavioral problems and dysphoric mood. The patient is not nervous/anxious.        Objective:   Physical Exam  Constitutional: She is oriented to person, place, and time. She appears well-developed and well-nourished.  HENT:  Head: Normocephalic.  Right Ear: External ear normal.  Left Ear: External ear normal.  Mouth/Throat: Oropharynx is clear and moist.  Eyes: Pupils are equal, round, and reactive to light. Conjunctivae and EOM are normal.  Neck: Normal range of motion. Neck supple. No thyromegaly present.  Cardiovascular: Normal rate, regular rhythm, normal heart sounds and intact distal pulses.  Occasional ectopics.  Appears to be in a normal sinus rhythm  Pulmonary/Chest: Effort normal and breath sounds normal. No respiratory distress. She has no wheezes. She has no rales.  Abdominal: Soft. Bowel sounds are normal. She exhibits no mass. There is no tenderness.  Musculoskeletal: Normal range of motion.  Lymphadenopathy:    She has no cervical adenopathy.  Neurological: She is alert and oriented to person, place, and time.  Skin: Skin is warm and dry. No rash noted.  Psychiatric: She has a normal mood and affect. Her behavior is normal.          Assessment & Plan:   Acute viral bronchitis.  Will treat symptomatically Paroxysmal atrial fibrillation.  Continue Coumadin anticoagulation History of hypertension stable  Cardiology follow-up as scheduled Coumadin clinic as scheduled  Nyoka Cowden

## 2017-09-05 NOTE — Patient Instructions (Addendum)

## 2017-09-12 ENCOUNTER — Ambulatory Visit (INDEPENDENT_AMBULATORY_CARE_PROVIDER_SITE_OTHER): Payer: Medicare Other | Admitting: Pharmacist

## 2017-09-12 DIAGNOSIS — I4891 Unspecified atrial fibrillation: Secondary | ICD-10-CM

## 2017-09-12 DIAGNOSIS — Z7901 Long term (current) use of anticoagulants: Secondary | ICD-10-CM | POA: Diagnosis not present

## 2017-09-12 LAB — POCT INR: INR: 1.7

## 2017-09-20 ENCOUNTER — Ambulatory Visit (INDEPENDENT_AMBULATORY_CARE_PROVIDER_SITE_OTHER): Payer: Medicare Other | Admitting: Pharmacist

## 2017-09-20 DIAGNOSIS — Z7901 Long term (current) use of anticoagulants: Secondary | ICD-10-CM

## 2017-09-20 DIAGNOSIS — I4891 Unspecified atrial fibrillation: Secondary | ICD-10-CM | POA: Diagnosis not present

## 2017-09-20 LAB — POCT INR: INR: 1.9

## 2017-09-27 ENCOUNTER — Other Ambulatory Visit: Payer: Self-pay | Admitting: Cardiology

## 2017-10-01 ENCOUNTER — Ambulatory Visit (INDEPENDENT_AMBULATORY_CARE_PROVIDER_SITE_OTHER): Payer: Medicare Other | Admitting: Pharmacist

## 2017-10-01 DIAGNOSIS — Z7901 Long term (current) use of anticoagulants: Secondary | ICD-10-CM

## 2017-10-01 DIAGNOSIS — I4891 Unspecified atrial fibrillation: Secondary | ICD-10-CM

## 2017-10-01 LAB — POCT INR: INR: 2.3

## 2017-10-01 MED ORDER — WARFARIN SODIUM 5 MG PO TABS: ORAL_TABLET | ORAL | 4 refills | Status: DC

## 2017-10-15 ENCOUNTER — Ambulatory Visit (INDEPENDENT_AMBULATORY_CARE_PROVIDER_SITE_OTHER): Payer: Medicare Other | Admitting: Pharmacist

## 2017-10-15 DIAGNOSIS — I4891 Unspecified atrial fibrillation: Secondary | ICD-10-CM

## 2017-10-15 DIAGNOSIS — Z7901 Long term (current) use of anticoagulants: Secondary | ICD-10-CM

## 2017-10-15 LAB — POCT INR: INR: 2.5

## 2017-10-17 ENCOUNTER — Other Ambulatory Visit: Payer: Self-pay | Admitting: Hematology

## 2017-10-17 DIAGNOSIS — C50411 Malignant neoplasm of upper-outer quadrant of right female breast: Secondary | ICD-10-CM

## 2017-10-17 DIAGNOSIS — Z170 Estrogen receptor positive status [ER+]: Principal | ICD-10-CM

## 2017-10-17 NOTE — Progress Notes (Signed)
Teresa Frey  [phone] Fax:([phone]) [phone]  Clinic follow up Note   Patient Care Team: Teresa Lor, MD as PCP - Teresa Chick, MD as Consulting Physician (General Surgery) Teresa Merle, MD as Consulting Physician (Hematology) Teresa Pray, MD as Consulting Physician (Radiation Oncology) Teresa Bison Charlestine Massed, NP as Nurse Practitioner (Hematology and Oncology)   Date of Service:  10/18/2017  CHIEF COMPLAINTS:  Follow up right breast cancer  Oncology History   Breast cancer of upper-outer quadrant of right female breast [facility])   Staging form: Breast, AJCC 7th Edition   - Clinical stage from 01/18/2016: Stage IA (T1b, N0, M0) - Unsigned   - Pathologic stage from 02/08/2016: Stage IA (T1c, N0, cM0) - Signed by Teresa Merle, MD on 02/27/2016      Breast cancer of upper-outer quadrant of right female breast ([place])   01/05/2016 Mammogram    Diagnostic mammogram and ultrasound showed a 9 mm mass in the upper outer quadrant of right breast, axillary nodes were negative.      01/09/2016 Initial Diagnosis    Breast cancer of upper-outer quadrant of right female breast ([place])      01/09/2016 Initial Biopsy    R breast 9:00 position biopsy showed invasive ductal carcinoma, G1-2      01/09/2016 Receptors her2    ER 90%+, PR 40%+, HER2-, Ki67 5%      02/08/2016 Surgery    Right breast lumpectomy, no SLN biopsy        02/08/2016 Pathology Results    Right breast lumpectomy showed invasive and in situ ductal carcinoma, 1.1 cm, margins were negative. Grade 1, no lymphovascular invasion. Atypical lobular hyperplasia.      02/21/2016 Genetic Testing    Genetic testing did not reveal a known deleterious mutation.  Genetic testing did identify a variant of uncertain significance (VUS) called "c.1659G>A (p.Met553Ile)" in one copy of the NBN gene.  Genes tested include: APC, ATM, AXIN2, BARD1, BMPR1A, BRCA1, BRCA2, BRIP1, CDH1, CDK4, CDKN2A, CHEK2, EPCAM, FANCC,  MLH1, MSH2, MSH6, MUTYH, NBN, PALB2, PMS2, POLD1, POLE, PTEN, RAD51C, RAD51D, SCG5/GREM1, SMAD4, STK11, TP53, VHL, and XRCC2.      02/28/2016 -  Anti-estrogen oral therapy    Anastrozole 1 mg daily        HISTORY OF PRESENTING ILLNESS:  Teresa Frey 84 y.o. female is here because of Her recently diagnosed right breast cancer. She is accompanied by her daughter Teresa Frey to our multidisciplinary clinic today.  This was discovered by screening mammogram. She never had abnormal screening mammogram or breast biopsy in the past. The diagnostic mammogram showed a 6 mm mass in the upper outer quadrant of right breast, it measured 9 mm by ultrasound. A history nodes were negative by ultrasound. She underwent ultrasound guided biopsy on 01/09/2016, which showed invasive ductal carcinoma, ER/PR positive, HER-2 negative.  She lives alone, lives with her husband, who had multiple cancer, on tube feeds, not active. She is his caregiver. She is very independent. She still works full-time for [employer]. She denies any symptoms. Her daughter Teresa Frey was diagnosed with breast cancer at age of 69. Her son died at age of 3 from alcohol. She helps to take of her grandchildren who is 60.  GYN HISTORY  Menarchal:13 LMP: late 68's  Contraceptive: a few years  HRT: no G2P2: her son died in 68's, she has a daughter   CURRENT THERAPY: anastrozole 28m daily started on 02/28/2016   INTERIM HISTORY:  Teresa Frey  for follow-up for her right breast cancer. She was last seen by me 6 months ago. Since her last visit she presented to the ED for Atrial Fibrillation on 06/28/17. She was placed by on Lopressor 12.5 mg BID and Eliquis 100m BID.  She presents to the clinic today by herself. She notes she is doing well overall. She notes her Afib has not been bothering her lately.   On review of symptoms, pt notes SOB when she walks. She can walk moderate distance without getting SOB. She notes she has gained weight. She notes  upper right quadrant pain. She denies constipation or diarrhea. She notes to having arthritis in her lower back and b/l hip stiffness. She does not ambulate with a cane. She still drives her car and works 8 hour days. She notes to having stiffness in her fingers.     MEDICAL HISTORY:  Past Medical History:  Diagnosis Date  . ANXIETY 04/07/2007  . Breast [place] of [place] 01/11/2016  . COLONIC POLYPS, HX OF 04/02/2007   adenomatous  . DIVERTICULITIS, HX OF 04/02/2007  . Diverticulosis   . DJD (degenerative joint disease)   . Hemorrhoids   . HYPERTENSION 04/02/2007  . OSTEOARTHRITIS, SEVERE 04/07/2007  . 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 WEIGHT GAIN 11/13/2007    SURGICAL HISTORY: Past Surgical History:  Procedure Laterality Date  . APPENDECTOMY    . BREAST LUMPECTOMY WITH RADIOACTIVE SEED LOCALIZATION Right 02/08/2016   Procedure: RIGHT BREAST LUMPECTOMY WITH RADIOACTIVE SEED LOCALIZATION;  Surgeon: HFanny Skates MD;  Location: [facility]  Service: General;  Laterality: Right;  . TONSILLECTOMY    . TOTAL HIP ARTHROPLASTY  1999, left hip    2006 right hip  . TUBAL LIGATION      SOCIAL HISTORY: Social History   Socioeconomic History  . Marital status: Married    Spouse name: Not on file  . Number of children: 2  . Years of education: Not on file  . Highest education level: Not on file  Occupational History  . Not on file  Social Needs  . Financial resource strain: Not on file  . Food insecurity:    Worry: Not on file    Inability: Not on file  . Transportation needs:    Medical: Not on file    Non-medical: Not on file  Tobacco Use  . Smoking status: Former Smoker    Packs/day: 1.00    Years: 27.00    Pack years: 27.00    Types: Cigarettes    Last attempt to quit: 06/04/1978    Years since quitting: 39.4  . Smokeless tobacco: Never Used  Substance and Sexual Activity  . Alcohol use: Yes     Alcohol/week: 4.2 oz    Types: 7 Glasses of wine per week    Comment: 1-2 glasses wine after dinner, but not necessarily each day  . Drug use: No  . Sexual activity: Not on file  Lifestyle  . Physical activity:    Days per week: Not on file    Minutes per session: Not on file  . Stress: Not on file  Relationships  . Social connections:    Talks on phone: Not on file    Gets together: Not on file    Attends religious service: Not on file    Active member of club or organization: Not on file    Attends meetings of clubs or organizations:  Not on file    Relationship status: Not on file  . Intimate partner violence:    Fear of current or ex partner: Not on file    Emotionally abused: Not on file    Physically abused: Not on file    Forced sexual activity: Not on file  Other Topics Concern  . Not on file  Social History Narrative  . Not on file    FAMILY HISTORY: Family History  Problem Relation Age of Onset  . Pancreatic cancer Mother 104       d. 74  . Cancer Father 26       hodgkin's lymphoma and leukemia; d. 70  . Colon cancer Brother        dx. late 4s; smoker  . Pancreatic cancer Brother 80       d. 30; smoker  . Lung cancer Brother 40       smoker  . Heart attack Brother        d. late 73s  . Colon cancer Paternal Aunt   . Breast cancer Daughter 25       negative genetic testing in 2016  . Skin cancer Daughter        non-melanoma type; +sun exposure  . Kidney failure Son 66       +EtOH abuse resulting in liver, kidney, and pancreas failure  . Diabetes Maternal Uncle        d. later age  . Heart disease Neg Hx        family  . Esophageal cancer Neg Hx   . Rectal cancer Neg Hx   . Stomach cancer Neg Hx     ALLERGIES:  has No Known Allergies.  MEDICATIONS:  Current Outpatient Medications  Medication Sig Dispense Refill  . anastrozole (ARIMIDEX) 1 MG tablet Take 1 tablet (1 mg total) daily by mouth. 90 tablet 3  . Ascorbic Acid (VITAMIN C) 1000 MG  tablet Take 1,000 mg by mouth daily.    Marland Kitchen BIOTIN 5000 PO Take 1 tablet by mouth daily.    . Calcium Carb-Cholecalciferol (CALCIUM 1000 + D PO) Take 1 tablet by mouth daily.    . hydrochlorothiazide (MICROZIDE) 12.5 MG capsule TAKE 1 CAPSULE BY MOUTH DAILY. 30 capsule 3  . metoprolol tartrate (LOPRESSOR) 25 MG tablet Take 1 tablet (25 mg total) by mouth daily. TAKE 1 TABLET BY MOUTH DAILY 30 tablet 6  . Multiple Vitamins-Minerals (CENTRUM SILVER ADULT 50+) TABS Take 1 tablet by mouth daily.    Marland Kitchen omega-3 acid ethyl esters (LOVAZA) 1 g capsule Take 1 g by mouth daily.     . Phenazopyridine HCl (AZO TABS PO) Take 1 capsule by mouth daily as needed (UTI).    Marland Kitchen sertraline (ZOLOFT) 50 MG tablet TAKE 1 TABLET BY MOUTH DAILY. 90 tablet 3  . warfarin (COUMADIN) 5 MG tablet Take 13m (3 tablets) on Tuesdays and Thursdays, take 10 mg (2 tablets) all other days of the week or as directed by coumadin clinic 90 tablet 4   No current facility-administered medications for this visit.     REVIEW OF SYSTEMS:   Constitutional: Denies fevers, chills, abnormal night sweats or hot flashes Eyes: Denies blurriness of vision, double vision or watery eyes Ears, nose, mouth, throat, and face: Denies mucositis or sore throat Respiratory: Denies cough, dyspnea or wheezes (+) SOB with exertion Cardiovascular: Denies palpitation, chest discomfort or lower extremity swelling Gastrointestinal:  Denies nausea, heartburn or change in bowel habits Skin: Denies abnormal  skin rashes Lymphatics: Denies new lymphadenopathy or easy bruising Musculoskeletal:  chronic left hip pain (+) arthritis in her lower back and stiffness in b/l hips and fingers Neurological:Denies numbness, tingling or new weaknesses Behavioral/Psych: Mood is stable, no new changes  All other systems were reviewed with the patient and are negative.  PHYSICAL EXAMINATION: ECOG PERFORMANCE STATUS: 1  Vitals:   10/18/17 1032  BP: (!) 151/68  Pulse: (!) 56    Resp: 17  Temp: 98.1 F (36.7 C)  SpO2: 99%   Filed Weights   10/18/17 1032  Weight: 159 lb 6.4 oz (72.3 kg)    GENERAL:alert, no distress and comfortable SKIN: skin color, texture, turgor are normal, no rashes or significant lesions EYES: normal, conjunctiva are pink and non-injected, sclera clear OROPHARYNX:no exudate, no erythema and lips, buccal mucosa, and tongue normal  NECK: supple, thyroid normal size, non-tender, without nodularity LYMPH:  no palpable lymphadenopathy in the cervical, axillary or inguinal LUNGS: clear to auscultation and percussion with normal breathing effort HEART: regular rate & rhythm and no murmurs and no lower extremity edema ABDOMEN:abdomen soft, non-tender and normal bowel sounds Musculoskeletal:no cyanosis of digits and no clubbing  PSYCH: alert & oriented x 3 with fluent speech NEURO: no focal motor/sensory deficits Breasts: S/p Right breast lumpectomy: (+) Very well healed surgical incision with no scar tissue in right breast around aerola, no other adenopathy or mass appreciated.     LABORATORY DATA:  I have reviewed the data as listed. CBC Latest Ref Rng & Units 10/18/2017 06/28/2017 04/19/2017  WBC 3.9 - 10.3 K/uL 8.8 14.2(H) 8.2  Hemoglobin 11.6 - 15.9 g/dL 14.4 14.4 14.2  Hematocrit 34.8 - 46.6 % 43.5 42.6 43.3  Platelets 145 - 400 K/uL 198 156 193   CMP Latest Ref Rng & Units 10/18/2017 06/28/2017 04/19/2017  Glucose 70 - 140 mg/dL 89 135(H) 92  BUN 7 - 26 mg/dL 20 22(H) 17.4  Creatinine 0.60 - 1.10 mg/dL 0.82 0.86 0.7  Sodium 136 - 145 mmol/L 143 139 142  Potassium 3.5 - 5.1 mmol/L 4.4 3.5 3.9  Chloride 98 - 109 mmol/L 105 103 -  CO2 22 - 29 mmol/L 30(H) 23 27  Calcium 8.4 - 10.4 mg/dL 10.0 9.3 9.7  Total Protein 6.4 - 8.3 g/dL 6.9 6.5 6.9  Total Bilirubin 0.2 - 1.2 mg/dL 0.4 1.0 0.49  Alkaline Phos 40 - 150 U/L 200(H) 127(H) 198(H)  AST 5 - 34 U/L 16 22 18   ALT 0 - 55 U/L 15 16 17    PATHOLOGY REPORT  Diagnosis  01/09/2016 Breast, right, needle core biopsy, 9 o'clock 3 cm fn INVASIVE DUCTAL CARCINOMA, GRADE 1 TO 2  Diagnosis 02/08/2016 Breast, lumpectomy, Right - INVASIVE AND IN SITU DUCTAL CARCINOMA, 1.1 CM. - MARGINS NOT INVOLVED. - FIBROCYSTIC CHANGES WITH CALCIFICATIONS. - LOBULAR NEOPLASIA (ATYPICAL LOBULAR HYPERPLASIA). - PREVIOUS BIOPSY SITE.   RADIOGRAPHIC STUDIES: I have personally reviewed the radiological images as listed and agreed with the findings in the report. No results found.  Her outside mammogram and ultrasound image in report were reviewed.   Bone Density 01/22/17 Normal with lowest T-Score of -0.9 at [facility]   [facility]  Patient reports mammogram was clear  Diagnostic mammogram and ultrasound of right breast 01/05/2016 Breast composition Repeat. There is a new 6 mm irregular high density architecture distortion in the right breast 10:00 middle tabs. It is measured 9 mm meter by ultrasound. Right axillary was negative for adenopathy. The mass is highly  suspicious for malignancy.  ASSESSMENT & PLAN: 84 y.o. Caucasian female, with screening discovered right breast cancer.  1. Breast cancer of upper-outer quadrant of right breast, invasive ductal carcinoma, pT1CN0M0, stage IA, ER+/PR+/HER2- -I previously reviewed her surgical pathology findings with patient in great details. -She has early-stage breast cancer, had a complete surgical resection. -Giving the low Ki-67 and agree 1 disease, I think this is likely a low risk cancer. I do not recommend adjuvant chemotherapy or Oncotype --she has started adjuvant anastrozole the end of September 2017, tolerating well overall, she has developed mild arthralgia especially in her hands, overall stable, likely related to anastrozole, it is manageable, we'll continue for now. I have low threshold to switch her to tamoxifen or stop adjuvant antiestrogen therapy if her arthralgia is much worse, due to her advanced  age. -I encourage her to exercise, which will likely help her arthralgia. -We will continue Anastrozole for 5 years total. -She is clinically doing well. Lab reviewed, her CBC and CMP are within normal limits except her fluctuating alk phos is 200, will continue to monitor. Her physical exam and her 12/2016 mammogram were unremarkable and 01/2017 DEXA normal. There is no clinical concern for recurrence. -Next Mammogram in 12/2017 with [facility]  -She will continue Anastrozole  -F/u in 6 months   2. Genetics -Her daughter was diagnosed with breast cancer at age of 85. -She has strong family history of colon cancer, pancreatic cancer, etc. I'll refer her to genetic counseling to ruled out inch syndrome or other inheritable cancer syndrome. -Her genetic testing was negative  3. HTN, AFib  -She was on hydrochlorothiazide for hypertension before, which was held when she had a 2 fibrillation. She is on low-dose metoprolol now.  -Her blood pressure has been significantly elevated daily, I strongly encouraged her to follow-up with her primary care physician as soon as possible  -Her blood pressure has been high in my office, I strongly encouraged her to monitor at home, and contact her primary care physician if remains to be high. -Given her Afib episode on 06/28/17, she was placed on Lopressor 12.72m BID and Eliquis 5 mg BID  4. Arthralgia, Joint Stiffness -Chronic left hip pain. Follows with orthopedics. Has had several replacements and falls.  -Last fall was several months ago, with no fracture -Taking meloxicam -Joint pain has slightly increased with anastrozole -I previously encouraged the patient to stay active as able to prevent joint pain. She will monitor her discomfort and call me if anything changes significantly. -I discussed anastrozole can increase her joint stiffness. I encouraged her to keep walking and be active with some exercise.   6. Bone health -I previously spoke with the patient  about the risk for decreased strength in bones with anastrozole -Encouraged her to begin taking calcium and Vitamin D supplements -01/22/17 DEXA was normal   Plan -Continue anastrozole -Lab and f/u in 6 months  -[place]   All questions were answered. The patient knows to call the clinic with any problems, questions or concerns. I spent 15 minutes counseling the patient face to face. The total time spent in the appointment was 20 minutes and more than 50% was on counseling.  This document serves as a record of services personally performed by YTruitt Merle MD. It was created on her behalf by AJoslyn Devon a trained medical scribe. The creation of this record is based on the scribe's personal observations and the provider's statements to them.  I have reviewed the above documentation for accuracy and completeness, and I agree with the above.     Teresa Merle, MD 10/18/2017

## 2017-10-18 ENCOUNTER — Ambulatory Visit: Payer: Medicare Other | Admitting: Hematology

## 2017-10-18 ENCOUNTER — Inpatient Hospital Stay: Payer: Medicare Other | Attending: Hematology

## 2017-10-18 ENCOUNTER — Inpatient Hospital Stay (HOSPITAL_BASED_OUTPATIENT_CLINIC_OR_DEPARTMENT_OTHER): Payer: Medicare Other | Admitting: Hematology

## 2017-10-18 ENCOUNTER — Other Ambulatory Visit: Payer: Medicare Other

## 2017-10-18 VITALS — BP 151/68 | HR 56 | Temp 98.1°F | Resp 17 | Ht 63.0 in | Wt 159.4 lb

## 2017-10-18 DIAGNOSIS — F419 Anxiety disorder, unspecified: Secondary | ICD-10-CM | POA: Diagnosis not present

## 2017-10-18 DIAGNOSIS — Z7901 Long term (current) use of anticoagulants: Secondary | ICD-10-CM

## 2017-10-18 DIAGNOSIS — Z800 Family history of malignant neoplasm of digestive organs: Secondary | ICD-10-CM | POA: Insufficient documentation

## 2017-10-18 DIAGNOSIS — G8929 Other chronic pain: Secondary | ICD-10-CM

## 2017-10-18 DIAGNOSIS — Z79811 Long term (current) use of aromatase inhibitors: Secondary | ICD-10-CM | POA: Insufficient documentation

## 2017-10-18 DIAGNOSIS — Z87891 Personal history of nicotine dependence: Secondary | ICD-10-CM | POA: Diagnosis not present

## 2017-10-18 DIAGNOSIS — Z170 Estrogen receptor positive status [ER+]: Secondary | ICD-10-CM | POA: Diagnosis not present

## 2017-10-18 DIAGNOSIS — I10 Essential (primary) hypertension: Secondary | ICD-10-CM

## 2017-10-18 DIAGNOSIS — M1990 Unspecified osteoarthritis, unspecified site: Secondary | ICD-10-CM | POA: Insufficient documentation

## 2017-10-18 DIAGNOSIS — Z79899 Other long term (current) drug therapy: Secondary | ICD-10-CM

## 2017-10-18 DIAGNOSIS — C50411 Malignant neoplasm of upper-outer quadrant of right female breast: Secondary | ICD-10-CM | POA: Diagnosis not present

## 2017-10-18 DIAGNOSIS — I4891 Unspecified atrial fibrillation: Secondary | ICD-10-CM | POA: Diagnosis not present

## 2017-10-18 LAB — CMP (CANCER CENTER ONLY)
ALBUMIN: 3.9 g/dL (ref 3.5–5.0)
ALT: 15 U/L (ref 0–55)
AST: 16 U/L (ref 5–34)
Alkaline Phosphatase: 200 U/L — ABNORMAL HIGH (ref 40–150)
Anion gap: 8 (ref 3–11)
BUN: 20 mg/dL (ref 7–26)
CHLORIDE: 105 mmol/L (ref 98–109)
CO2: 30 mmol/L — AB (ref 22–29)
Calcium: 10 mg/dL (ref 8.4–10.4)
Creatinine: 0.82 mg/dL (ref 0.60–1.10)
GFR, Est AFR Am: >60 mL/min (ref >60)
GFR, Estimated: >60 mL/min (ref >60)
GLUCOSE: 89 mg/dL (ref 70–140)
POTASSIUM: 4.4 mmol/L (ref 3.5–5.1)
Sodium: 143 mmol/L (ref 136–145)
Total Bilirubin: 0.4 mg/dL (ref 0.2–1.2)
Total Protein: 6.9 g/dL (ref 6.4–8.3)

## 2017-10-18 LAB — CBC WITH DIFFERENTIAL (CANCER CENTER ONLY)
Basophils Absolute: 0 10*3/uL (ref 0.0–0.1)
Basophils Relative: 0 %
EOS PCT: 1 %
Eosinophils Absolute: 0.1 10*3/uL (ref 0.0–0.5)
HCT: 43.5 % (ref 34.8–46.6)
Hemoglobin: 14.4 g/dL (ref 11.6–15.9)
LYMPHS ABS: 2.5 10*3/uL (ref 0.9–3.3)
Lymphocytes Relative: 28 %
MCH: 29.6 pg (ref 25.1–34.0)
MCHC: 33 g/dL (ref 31.5–36.0)
MCV: 89.5 fL (ref 79.5–101.0)
MONO ABS: 0.7 10*3/uL (ref 0.1–0.9)
Monocytes Relative: 8 %
Neutro Abs: 5.5 10*3/uL (ref 1.5–6.5)
Neutrophils Relative %: 63 %
Platelet Count: 198 10*3/uL (ref 145–400)
RBC: 4.86 MIL/uL (ref 3.70–5.45)
RDW: 14.2 % (ref 11.2–14.5)
WBC Count: 8.8 10*3/uL (ref 3.9–10.3)

## 2017-10-19 ENCOUNTER — Encounter: Payer: Self-pay | Admitting: Hematology

## 2017-11-04 DIAGNOSIS — H0014 Chalazion left upper eyelid: Secondary | ICD-10-CM | POA: Diagnosis not present

## 2017-11-05 ENCOUNTER — Ambulatory Visit (INDEPENDENT_AMBULATORY_CARE_PROVIDER_SITE_OTHER): Payer: Medicare Other | Admitting: Pharmacist

## 2017-11-05 DIAGNOSIS — Z7901 Long term (current) use of anticoagulants: Secondary | ICD-10-CM

## 2017-11-05 DIAGNOSIS — I4891 Unspecified atrial fibrillation: Secondary | ICD-10-CM | POA: Diagnosis not present

## 2017-11-05 LAB — POCT INR: INR: 1.9 — AB (ref 2.0–3.0)

## 2017-11-19 ENCOUNTER — Ambulatory Visit (INDEPENDENT_AMBULATORY_CARE_PROVIDER_SITE_OTHER): Payer: Medicare Other | Admitting: Pharmacist

## 2017-11-19 DIAGNOSIS — I4891 Unspecified atrial fibrillation: Secondary | ICD-10-CM | POA: Diagnosis not present

## 2017-11-19 DIAGNOSIS — Z7901 Long term (current) use of anticoagulants: Secondary | ICD-10-CM

## 2017-11-19 LAB — POCT INR: INR: 3.2 — AB (ref 2.0–3.0)

## 2017-11-20 ENCOUNTER — Encounter: Payer: Self-pay | Admitting: Internal Medicine

## 2017-11-20 ENCOUNTER — Ambulatory Visit (INDEPENDENT_AMBULATORY_CARE_PROVIDER_SITE_OTHER): Payer: Medicare Other | Admitting: Internal Medicine

## 2017-11-20 VITALS — BP 132/60 | HR 56 | Temp 98.2°F | Wt 161.0 lb

## 2017-11-20 DIAGNOSIS — R7302 Impaired glucose tolerance (oral): Secondary | ICD-10-CM | POA: Diagnosis not present

## 2017-11-20 DIAGNOSIS — I4891 Unspecified atrial fibrillation: Secondary | ICD-10-CM | POA: Diagnosis not present

## 2017-11-20 DIAGNOSIS — Z7901 Long term (current) use of anticoagulants: Secondary | ICD-10-CM | POA: Diagnosis not present

## 2017-11-20 DIAGNOSIS — I10 Essential (primary) hypertension: Secondary | ICD-10-CM | POA: Diagnosis not present

## 2017-11-20 NOTE — Patient Instructions (Signed)
Return in the fall for follow-up with Dr. [name]  Limit your sodium [facility]) intake  Cardiology and oncology follow-up as scheduled

## 2017-11-20 NOTE — Progress Notes (Signed)
Subjective:    Patient ID: Teresa Frey, female    DOB: 05-14-1930, 84 y.o.   MRN: [id number]  HPI  84 year old patient who is seen today for her biannual follow-up She has PAF and remains on chronic Coumadin anticoagulation.  She is followed periodically by cardiology. She has a history of right breast cancer and is also followed by oncology. Doing quite well today without concerns or complaints  Past Medical History:  Diagnosis Date  . ANXIETY 04/07/2007  . Breast cancer of upper-outer quadrant of right female breast ([place]) 01/11/2016  . COLONIC POLYPS, HX OF 04/02/2007   adenomatous  . DIVERTICULITIS, HX OF 04/02/2007  . Diverticulosis   . DJD (degenerative joint disease)   . Hemorrhoids   . HYPERTENSION 04/02/2007  . OSTEOARTHRITIS, SEVERE 04/07/2007  . 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 WEIGHT GAIN 11/13/2007     Social History   Socioeconomic History  . Marital status: Married    Spouse name: Not on file  . Number of children: 2  . Years of education: Not on file  . Highest education level: Not on file  Occupational History  . Not on file  Social Needs  . Financial resource strain: Not on file  . Food insecurity:    Worry: Not on file    Inability: Not on file  . Transportation needs:    Medical: Not on file    Non-medical: Not on file  Tobacco Use  . Smoking status: Former Smoker    Packs/day: 1.00    Years: 27.00    Pack years: 27.00    Types: Cigarettes    Last attempt to quit: 06/04/1978    Years since quitting: 39.4  . Smokeless tobacco: Never Used  Substance and Sexual Activity  . Alcohol use: Yes    Alcohol/week: 4.2 oz    Types: 7 Glasses of wine per week    Comment: 1-2 glasses wine after dinner, but not necessarily each day  . Drug use: No  . Sexual activity: Not on file  Lifestyle  . Physical activity:    Days per week: Not on file    Minutes per session: Not on file  . Stress: Not on file  Relationships  . Social  connections:    Talks on phone: Not on file    Gets together: Not on file    Attends religious service: Not on file    Active member of club or organization: Not on file    Attends meetings of clubs or organizations: Not on file    Relationship status: Not on file  . Intimate partner violence:    Fear of current or ex partner: Not on file    Emotionally abused: Not on file    Physically abused: Not on file    Forced sexual activity: Not on file  Other Topics Concern  . Not on file  Social History Narrative  . Not on file    Past Surgical History:  Procedure Laterality Date  . APPENDECTOMY    . BREAST LUMPECTOMY WITH RADIOACTIVE SEED LOCALIZATION Right 02/08/2016   Procedure: RIGHT BREAST LUMPECTOMY WITH RADIOACTIVE SEED LOCALIZATION;  Surgeon: Fanny Skates, MD;  Location: [facility];  Service: General;  Laterality: Right;  . TONSILLECTOMY    . TOTAL HIP ARTHROPLASTY  1999, left hip    2006 right hip  . TUBAL LIGATION      Family History  Problem Relation Age of  Onset  . Pancreatic cancer Mother 7       d. 14  . Cancer Father 27       hodgkin's lymphoma and leukemia; d. 93  . Colon cancer Brother        dx. late 76s; smoker  . Pancreatic cancer Brother 80       d. 30; smoker  . Lung cancer Brother 40       smoker  . Heart attack Brother        d. late 61s  . Colon cancer Paternal Aunt   . Breast cancer Daughter 38       negative genetic testing in 2016  . Skin cancer Daughter        non-melanoma type; +sun exposure  . Kidney failure Son 21       +EtOH abuse resulting in liver, kidney, and pancreas failure  . Diabetes Maternal Uncle        d. later age  . Heart disease Neg Hx        family  . Esophageal cancer Neg Hx   . Rectal cancer Neg Hx   . Stomach cancer Neg Hx     No Known Allergies  Current Outpatient Medications on File Prior to Visit  Medication Sig Dispense Refill  . anastrozole (ARIMIDEX) 1 MG tablet Take 1 tablet (1 mg total)  daily by mouth. 90 tablet 3  . Ascorbic Acid (VITAMIN C) 1000 MG tablet Take 1,000 mg by mouth daily.    Marland Kitchen BIOTIN 5000 PO Take 1 tablet by mouth daily.    . Calcium Carb-Cholecalciferol (CALCIUM 1000 + D PO) Take 1 tablet by mouth daily.    . hydrochlorothiazide (MICROZIDE) 12.5 MG capsule TAKE 1 CAPSULE BY MOUTH DAILY. 30 capsule 3  . metoprolol tartrate (LOPRESSOR) 25 MG tablet Take 1 tablet (25 mg total) by mouth daily. TAKE 1 TABLET BY MOUTH DAILY 30 tablet 6  . Multiple Vitamins-Minerals (CENTRUM SILVER ADULT 50+) TABS Take 1 tablet by mouth daily.    Marland Kitchen omega-3 acid ethyl esters (LOVAZA) 1 g capsule Take 1 g by mouth daily.     . Phenazopyridine HCl (AZO TABS PO) Take 1 capsule by mouth daily as needed (UTI).    Marland Kitchen sertraline (ZOLOFT) 50 MG tablet TAKE 1 TABLET BY MOUTH DAILY. 90 tablet 3  . warfarin (COUMADIN) 5 MG tablet Take 15mg  (3 tablets) on Tuesdays and Thursdays, take 10 mg (2 tablets) all other days of the week or as directed by coumadin clinic 90 tablet 4   No current facility-administered medications on file prior to visit.     BP 132/60 (BP Location: Right Arm, Patient Position: Sitting, Cuff Size: Normal)   Pulse (!) 56   Temp 98.2 F (36.8 C) (Oral)   Wt 161 lb (73 kg)   SpO2 91%   BMI 28.52 kg/m     Review of Systems  Constitutional: Negative.   HENT: Negative for congestion, dental problem, hearing loss, rhinorrhea, sinus pressure, sore throat and tinnitus.   Eyes: Negative for pain, discharge and visual disturbance.  Respiratory: Negative for cough and shortness of breath.   Cardiovascular: Negative for chest pain, palpitations and leg swelling.  Gastrointestinal: Negative for abdominal distention, abdominal pain, blood in stool, constipation, diarrhea, nausea and vomiting.  Genitourinary: Negative for difficulty urinating, dysuria, flank pain, frequency, hematuria, pelvic pain, urgency, vaginal bleeding, vaginal discharge and vaginal pain.  Musculoskeletal:  Negative for arthralgias, gait problem and joint swelling.  Skin: Negative  for rash.  Neurological: Negative for dizziness, syncope, speech difficulty, weakness, numbness and headaches.  Hematological: Negative for adenopathy.  Psychiatric/Behavioral: Negative for agitation, behavioral problems and dysphoric mood. The patient is not nervous/anxious.        Objective:   Physical Exam  Constitutional: She is oriented to person, place, and time. She appears well-developed and well-nourished.  Blood pressure 130/80  HENT:  Head: Normocephalic.  Right Ear: External ear normal.  Left Ear: External ear normal.  Mouth/Throat: Oropharynx is clear and moist.  Eyes: Pupils are equal, round, and reactive to light. Conjunctivae and EOM are normal.  Neck: Normal range of motion. Neck supple. No thyromegaly present.  Cardiovascular: Normal rate, regular rhythm and normal heart sounds.  Rhythm is regular  Pulmonary/Chest: Effort normal and breath sounds normal.  Abdominal: Soft. Bowel sounds are normal. She exhibits no mass. There is no tenderness.  Musculoskeletal: Normal range of motion.  Lymphadenopathy:    She has no cervical adenopathy.  Neurological: She is alert and oriented to person, place, and time.  Skin: Skin is warm and dry. No rash noted.  Psychiatric: She has a normal mood and affect. Her behavior is normal.          Assessment & Plan:   Paroxysmal atrial fibrillation.  Continue Coumadin anticoagulation.  INR checked yesterday.  Follow-up cardiology History of right breast cancer.  Follow-up oncology Essential hypertension stable.  Continue metoprolol hydrochlorothiazide  Return in the fall for follow-up and consideration of flu vaccine  Marletta Lor

## 2017-11-26 ENCOUNTER — Other Ambulatory Visit: Payer: Self-pay | Admitting: Internal Medicine

## 2017-11-26 DIAGNOSIS — H02055 Trichiasis without entropian left lower eyelid: Secondary | ICD-10-CM | POA: Diagnosis not present

## 2017-11-26 DIAGNOSIS — H0014 Chalazion left upper eyelid: Secondary | ICD-10-CM | POA: Diagnosis not present

## 2017-11-26 DIAGNOSIS — H5203 Hypermetropia, bilateral: Secondary | ICD-10-CM | POA: Diagnosis not present

## 2017-11-26 DIAGNOSIS — H26492 Other secondary cataract, left eye: Secondary | ICD-10-CM | POA: Diagnosis not present

## 2017-11-27 NOTE — Telephone Encounter (Signed)
Sent to the pharmacy by e-scribe for 90 days.  Pt has new pt appt with Dr. [name] on 02/24/18.

## 2017-12-02 ENCOUNTER — Other Ambulatory Visit: Payer: Self-pay | Admitting: Internal Medicine

## 2017-12-16 ENCOUNTER — Ambulatory Visit (INDEPENDENT_AMBULATORY_CARE_PROVIDER_SITE_OTHER): Payer: Medicare Other | Admitting: Pharmacist

## 2017-12-16 DIAGNOSIS — Z7901 Long term (current) use of anticoagulants: Secondary | ICD-10-CM

## 2017-12-16 DIAGNOSIS — I4891 Unspecified atrial fibrillation: Secondary | ICD-10-CM

## 2017-12-16 LAB — POCT INR: INR: 2.6 (ref 2.0–3.0)

## 2017-12-26 ENCOUNTER — Encounter: Payer: Self-pay | Admitting: Family Medicine

## 2017-12-26 DIAGNOSIS — Z853 Personal history of malignant neoplasm of breast: Secondary | ICD-10-CM | POA: Diagnosis not present

## 2017-12-26 DIAGNOSIS — R928 Other abnormal and inconclusive findings on diagnostic imaging of breast: Secondary | ICD-10-CM | POA: Diagnosis not present

## 2018-01-14 ENCOUNTER — Ambulatory Visit (INDEPENDENT_AMBULATORY_CARE_PROVIDER_SITE_OTHER): Payer: Medicare Other | Admitting: Pharmacist

## 2018-01-14 DIAGNOSIS — Z7901 Long term (current) use of anticoagulants: Secondary | ICD-10-CM | POA: Diagnosis not present

## 2018-01-14 DIAGNOSIS — I4891 Unspecified atrial fibrillation: Secondary | ICD-10-CM | POA: Diagnosis not present

## 2018-01-14 LAB — POCT INR: INR: 2.6 (ref 2.0–3.0)

## 2018-02-11 ENCOUNTER — Ambulatory Visit (INDEPENDENT_AMBULATORY_CARE_PROVIDER_SITE_OTHER): Payer: Medicare Other | Admitting: Pharmacist

## 2018-02-11 DIAGNOSIS — Z7901 Long term (current) use of anticoagulants: Secondary | ICD-10-CM

## 2018-02-11 DIAGNOSIS — I4891 Unspecified atrial fibrillation: Secondary | ICD-10-CM | POA: Diagnosis not present

## 2018-02-11 LAB — POCT INR: INR: 2.8 (ref 2.0–3.0)

## 2018-02-24 ENCOUNTER — Ambulatory Visit (INDEPENDENT_AMBULATORY_CARE_PROVIDER_SITE_OTHER): Payer: Medicare Other | Admitting: Family Medicine

## 2018-02-24 ENCOUNTER — Encounter: Payer: Self-pay | Admitting: Family Medicine

## 2018-02-24 VITALS — BP 140/80 | HR 56 | Temp 98.3°F | Resp 16 | Ht 63.0 in | Wt 165.0 lb

## 2018-02-24 DIAGNOSIS — R001 Bradycardia, unspecified: Secondary | ICD-10-CM

## 2018-02-24 DIAGNOSIS — R0609 Other forms of dyspnea: Secondary | ICD-10-CM | POA: Diagnosis not present

## 2018-02-24 DIAGNOSIS — Z7901 Long term (current) use of anticoagulants: Secondary | ICD-10-CM | POA: Diagnosis not present

## 2018-02-24 DIAGNOSIS — F419 Anxiety disorder, unspecified: Secondary | ICD-10-CM | POA: Diagnosis not present

## 2018-02-24 DIAGNOSIS — I10 Essential (primary) hypertension: Secondary | ICD-10-CM

## 2018-02-24 DIAGNOSIS — I4891 Unspecified atrial fibrillation: Secondary | ICD-10-CM | POA: Diagnosis not present

## 2018-02-24 DIAGNOSIS — R0600 Dyspnea, unspecified: Secondary | ICD-10-CM

## 2018-02-24 MED ORDER — ALBUTEROL SULFATE HFA 108 (90 BASE) MCG/ACT IN AERS: 2.0000 | INHALATION_SPRAY | Freq: Four times a day (QID) | RESPIRATORY_TRACT | 0 refills | Status: DC | PRN

## 2018-02-24 NOTE — Assessment & Plan Note (Addendum)
Rate and rhythm controlled. Asymptomatic. No changes in Coumadin or metoprolol.  Continue following with cardiologist.

## 2018-02-24 NOTE — Assessment & Plan Note (Signed)
Recheck: 140/80. Recommend monitoring BP at home. For now no changes in metoprolol titrate 25 mg twice daily or HCTZ 12.5 mg daily. Follow-up in 3 months.

## 2018-02-24 NOTE — Patient Instructions (Addendum)
A few things to remember from today's visit:   Essential hypertension  Atrial fibrillation with RVR (HCC)  Anxiety disorder, unspecified type  Long term (current) use of anticoagulants [Z79.01]  Exertional dyspnea - Plan: albuterol (PROVENTIL HFA;VENTOLIN HFA) 108 (90 Base) MCG/ACT inhaler  Today we added albuterol inhaler, you will used 2 puff every 6 hours as needed for shortness of breath. Please let me know how you are doing with the inhaler, if symptoms get worse I will need to see you before we planned today.  Monitor your blood pressure at home as well as pulse, metoprolol can cause low heart rate.  Please be sure medication list is accurate. If a new problem present, please set up appointment sooner than planned today.

## 2018-02-24 NOTE — Progress Notes (Signed)
HPI:   Teresa Frey is a 84 y.o. female, who is here today to establish care.  Former PCP: Dr. Burnice Logan. Last preventive routine visit: 11/2016.  Chronic medical problems:  Right breast cancer, s/p lumpectomy. She follows with oncologist (Dr. Burr Medico). Atrial fibrillation, she is followed with cardiologist and Coumadin clinic.   HTN: On HCTZ  12.5 mg daily and Metoprolol tartrate once daily.  Denies severe/frequent headache, visual changes, chest pain, palpitation, claudication, focal weakness, or edema.  Lab Results  Component Value Date   CREATININE 0.82 10/18/2017   BUN 20 10/18/2017   NA 143 10/18/2017   K 4.4 10/18/2017   CL 105 10/18/2017   CO2 30 (H) 10/18/2017    Anxiety: She has been on Zoloft for years, prescribed initially because "crying easy" with sad stories. Denies depressed mood or suicidal thoughts. Tolerating medication well,no side effects.   Concerns today: Shortness of breath   Wheezing and SOB intermittently for years and exacerbated by exertion. Stable. Former smoker, smoked for about 15 years. Occasionally productive cough with "a little" bit of phlegm. No hemoptysis.  She has not noted fever,chills,chest pain,diaphoresis,orthopnea,or PND.  CXR 06/28/17: 1. No evidence of acute cardiopulmonary disease 2. Cardiomegaly and probable COPD changes  She has not tried treatments.    Review of Systems  Constitutional: Negative for activity change, appetite change, fatigue and fever.  HENT: Negative for mouth sores, nosebleeds and trouble swallowing.   Eyes: Negative for redness and visual disturbance.  Respiratory: Positive for cough, shortness of breath and wheezing.   Cardiovascular: Negative for chest pain, palpitations and leg swelling.  Gastrointestinal: Negative for abdominal pain, nausea and vomiting.       Negative for changes in bowel habits.  Genitourinary: Negative for decreased urine volume, dysuria and hematuria.    Musculoskeletal: Positive for arthralgias and gait problem.  Neurological: Negative for syncope, weakness and headaches.  Psychiatric/Behavioral: Negative for confusion. The patient is nervous/anxious.       Current Outpatient Medications on File Prior to Visit  Medication Sig Dispense Refill  . anastrozole (ARIMIDEX) 1 MG tablet Take 1 tablet (1 mg total) daily by mouth. 90 tablet 3  . Ascorbic Acid (VITAMIN C) 1000 MG tablet Take 1,000 mg by mouth daily.    Marland Kitchen BIOTIN 5000 PO Take 1 tablet by mouth daily.    . Calcium Carb-Cholecalciferol (CALCIUM 1000 + D PO) Take 1 tablet by mouth daily.    . hydrochlorothiazide (MICROZIDE) 12.5 MG capsule TAKE 1 CAPSULE BY MOUTH DAILY. 30 capsule 3  . metoprolol tartrate (LOPRESSOR) 25 MG tablet Take 1 tablet (25 mg total) by mouth daily. TAKE 1 TABLET BY MOUTH DAILY 30 tablet 6  . Multiple Vitamins-Minerals (CENTRUM SILVER ADULT 50+) TABS Take 1 tablet by mouth daily.    Marland Kitchen omega-3 acid ethyl esters (LOVAZA) 1 g capsule Take 1 g by mouth daily.     . Phenazopyridine HCl (AZO TABS PO) Take 1 capsule by mouth daily as needed (UTI).    Marland Kitchen sertraline (ZOLOFT) 50 MG tablet TAKE 1 TABLET BY MOUTH DAILY. 90 tablet 0  . warfarin (COUMADIN) 5 MG tablet Take 15mg  (3 tablets) on Tuesdays and Thursdays, take 10 mg (2 tablets) all other days of the week or as directed by coumadin clinic 90 tablet 4   No current facility-administered medications on file prior to visit.      Past Medical History:  Diagnosis Date  . ANXIETY 04/07/2007  . Breast cancer of  upper-outer quadrant of right female breast ([place]) 01/11/2016  . COLONIC POLYPS, HX OF 04/02/2007   adenomatous  . DIVERTICULITIS, HX OF 04/02/2007  . Diverticulosis   . DJD (degenerative joint disease)   . Hemorrhoids   . HYPERTENSION 04/02/2007  . OSTEOARTHRITIS, SEVERE 04/07/2007  . 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 WEIGHT GAIN 11/13/2007   Allergies  Allergen Reactions  . Tramadol     Loss  of control    Family History  Problem Relation Age of Onset  . Pancreatic cancer Mother 83       d. 65  . Cancer Father 45       hodgkin's lymphoma and leukemia; d. 44  . Colon cancer Brother        dx. late 26s; smoker  . Pancreatic cancer Brother 80       d. 23; smoker  . Lung cancer Brother 40       smoker  . Heart attack Brother        d. late 89s  . Colon cancer Paternal Aunt   . Breast cancer Daughter 79       negative genetic testing in 2016  . Skin cancer Daughter        non-melanoma type; +sun exposure  . Kidney failure Son 41       +EtOH abuse resulting in liver, kidney, and pancreas failure  . Diabetes Maternal Uncle        d. later age  . Heart disease Neg Hx        family  . Esophageal cancer Neg Hx   . Rectal cancer Neg Hx   . Stomach cancer Neg Hx     Social History   Socioeconomic History  . Marital status: Married    Spouse name: Not on file  . Number of children: 2  . Years of education: Not on file  . Highest education level: Not on file  Occupational History  . Not on file  Social Needs  . Financial resource strain: Not on file  . Food insecurity:    Worry: Not on file    Inability: Not on file  . Transportation needs:    Medical: Not on file    Non-medical: Not on file  Tobacco Use  . Smoking status: Former Smoker    Packs/day: 1.00    Years: 27.00    Pack years: 27.00    Types: Cigarettes    Last attempt to quit: 06/04/1978    Years since quitting: 39.7  . Smokeless tobacco: Never Used  Substance and Sexual Activity  . Alcohol use: Yes    Alcohol/week: 7.0 standard drinks    Types: 7 Glasses of wine per week    Comment: 1-2 glasses wine after dinner, but not necessarily each day  . Drug use: No  . Sexual activity: Not on file  Lifestyle  . Physical activity:    Days per week: Not on file    Minutes per session: Not on file  . Stress: Not on file  Relationships  . Social connections:    Talks on phone: Not on file    Gets  together: Not on file    Attends religious service: Not on file    Active member of club or organization: Not on file    Attends meetings of clubs or organizations: Not on file    Relationship status: Not on file  Other Topics Concern  . Not on  file  Social History Narrative  . Not on file    Vitals:   02/24/18 0933 02/24/18 1010  BP: (!) 142/70 140/80  Pulse: (!) 56   Resp: 16   Temp: 98.3 F (36.8 C)   SpO2: 95%     Body mass index is 29.23 kg/m.   Physical Exam  Nursing note and vitals reviewed. Constitutional: She is oriented to person, place, and time. She appears well-developed. No distress.  HENT:  Head: Normocephalic and atraumatic.  Mouth/Throat: Oropharynx is clear and moist and mucous membranes are normal.  Eyes: Pupils are equal, round, and reactive to light. Conjunctivae are normal.  Cardiovascular: Regular rhythm. Bradycardia present.  Murmur (Soft SEM RUSB) heard. Pulses:      Dorsalis pedis pulses are 2+ on the right side, and 2+ on the left side.  Respiratory: Effort normal and breath sounds normal. No respiratory distress.  GI: Soft. She exhibits no mass. There is no hepatomegaly. There is no tenderness.  Musculoskeletal: She exhibits no edema.  Lymphadenopathy:    She has no cervical adenopathy.  Neurological: She is alert and oriented to person, place, and time. She has normal strength. No cranial nerve deficit.  Mildly unstable gait with no assistance.  Skin: Skin is warm. No rash noted. No erythema.  Psychiatric: She has a normal mood and affect.  Well groomed, good eye contact.      ASSESSMENT AND PLAN:  Teresa Frey was here today to establish care.  Diagnoses and all orders for this visit:  Exertional dyspnea  Chronic and stable. We discussed possible etiologies. Given her history of tobacco use + report of CXR it seems like COPD is most likely causing the symptoms. We discussed some side effects of albuterol. Instructed about warning  signs. Follow-up in 3 months, before if needed.  -     albuterol (PROVENTIL HFA;VENTOLIN HFA) 108 (90 Base) MCG/ACT inhaler; Inhale 2 puffs into the lungs every 6 (six) hours as needed for wheezing or shortness of breath.   Essential hypertension Recheck: 140/80. Recommend monitoring BP at home. For now no changes in metoprolol titrate 25 mg twice daily or HCTZ 12.5 mg daily. Follow-up in 3 months.  Anxiety disorder, unspecified It has been a stable with Zoloft 50 mg daily. She would like to continue current management. Instructed about warning signs. Follow-up in 6 to 12 months, before if needed.  Atrial fibrillation with RVR (HCC) Rate and rhythm controlled. Asymptomatic. No changes in Coumadin or metoprolol.  Continue following with cardiologist.  Long term (current) use of anticoagulants [Z79.01] INR goal 2-3. No changes in Coumadin dose. Continue following with Coumadin clinic.   Bradycardia, sinus: We discussed some side effects of beta-blockers. For now no changes in metoprolol dose. She was instructed to monitor HR at home. She has appointment with her cardiologist in 08/2018.        Nathanyl Andujo G. [name], MD  [facility]. [place] office.

## 2018-02-24 NOTE — Assessment & Plan Note (Signed)
INR goal 2-3. No changes in Coumadin dose. Continue following with Coumadin clinic.

## 2018-02-24 NOTE — Assessment & Plan Note (Signed)
It has been a stable with Zoloft 50 mg daily. She would like to continue current management. Instructed about warning signs. Follow-up in 6 to 12 months, before if needed.

## 2018-02-25 ENCOUNTER — Telehealth: Payer: Self-pay | Admitting: *Deleted

## 2018-02-25 MED ORDER — SERTRALINE HCL 50 MG PO TABS: 50.0000 mg | ORAL_TABLET | Freq: Every day | ORAL | 2 refills | Status: DC

## 2018-02-25 NOTE — Telephone Encounter (Signed)
Rx request for Zoloft 50 mg, take 1 tablet by mouth daily #90

## 2018-02-25 NOTE — Telephone Encounter (Signed)
Prescription for sertraline 50 mg was sent to her pharmacy. Thanks, BJ

## 2018-03-12 ENCOUNTER — Ambulatory Visit (INDEPENDENT_AMBULATORY_CARE_PROVIDER_SITE_OTHER): Payer: Medicare Other | Admitting: Pharmacist Clinician (PhC)/ Clinical Pharmacy Specialist

## 2018-03-12 DIAGNOSIS — I4891 Unspecified atrial fibrillation: Secondary | ICD-10-CM

## 2018-03-12 DIAGNOSIS — Z7901 Long term (current) use of anticoagulants: Secondary | ICD-10-CM | POA: Diagnosis not present

## 2018-03-12 LAB — POCT INR: INR: 2.6 (ref 2.0–3.0)

## 2018-03-12 NOTE — Patient Instructions (Signed)
Description   Continue taking 10mg  (2 tablets) daily except for 15mg  (3 tablets) every Tuesday and Thursday. Repeat INR in 4 weeks

## 2018-04-01 ENCOUNTER — Other Ambulatory Visit: Payer: Self-pay | Admitting: Internal Medicine

## 2018-04-04 DIAGNOSIS — Z23 Encounter for immunization: Secondary | ICD-10-CM | POA: Diagnosis not present

## 2018-04-09 ENCOUNTER — Ambulatory Visit (INDEPENDENT_AMBULATORY_CARE_PROVIDER_SITE_OTHER): Payer: Medicare Other | Admitting: Pharmacist

## 2018-04-09 DIAGNOSIS — Z7901 Long term (current) use of anticoagulants: Secondary | ICD-10-CM

## 2018-04-09 DIAGNOSIS — I4891 Unspecified atrial fibrillation: Secondary | ICD-10-CM | POA: Diagnosis not present

## 2018-04-09 LAB — POCT INR: INR: 2.3 (ref 2.0–3.0)

## 2018-04-18 ENCOUNTER — Telehealth: Payer: Self-pay | Admitting: Hematology

## 2018-04-18 ENCOUNTER — Encounter: Payer: Self-pay | Admitting: Hematology

## 2018-04-18 ENCOUNTER — Inpatient Hospital Stay: Payer: Medicare Other | Attending: Hematology | Admitting: Hematology

## 2018-04-18 ENCOUNTER — Inpatient Hospital Stay: Payer: Medicare Other

## 2018-04-18 VITALS — BP 157/66 | HR 56 | Temp 98.1°F | Resp 18 | Ht 63.0 in | Wt 163.1 lb

## 2018-04-18 DIAGNOSIS — Z79811 Long term (current) use of aromatase inhibitors: Secondary | ICD-10-CM | POA: Diagnosis not present

## 2018-04-18 DIAGNOSIS — Z170 Estrogen receptor positive status [ER+]: Secondary | ICD-10-CM

## 2018-04-18 DIAGNOSIS — I10 Essential (primary) hypertension: Secondary | ICD-10-CM | POA: Diagnosis not present

## 2018-04-18 DIAGNOSIS — I4891 Unspecified atrial fibrillation: Secondary | ICD-10-CM | POA: Insufficient documentation

## 2018-04-18 DIAGNOSIS — C50411 Malignant neoplasm of upper-outer quadrant of right female breast: Secondary | ICD-10-CM

## 2018-04-18 DIAGNOSIS — Z7901 Long term (current) use of anticoagulants: Secondary | ICD-10-CM

## 2018-04-18 LAB — CBC WITH DIFFERENTIAL (CANCER CENTER ONLY)
ABS IMMATURE GRANULOCYTES: 0.03 10*3/uL (ref 0.00–0.07)
Basophils Absolute: 0 10*3/uL (ref 0.0–0.1)
Basophils Relative: 0 %
EOS ABS: 0.2 10*3/uL (ref 0.0–0.5)
Eosinophils Relative: 2 %
HCT: 45.7 % (ref 36.0–46.0)
Hemoglobin: 14.6 g/dL (ref 12.0–15.0)
Immature Granulocytes: 0 %
Lymphocytes Relative: 32 %
Lymphs Abs: 2.8 10*3/uL (ref 0.7–4.0)
MCH: 29.7 pg (ref 26.0–34.0)
MCHC: 31.9 g/dL (ref 30.0–36.0)
MCV: 92.9 fL (ref 80.0–100.0)
MONO ABS: 0.7 10*3/uL (ref 0.1–1.0)
MONOS PCT: 8 %
NEUTROS ABS: 5.1 10*3/uL (ref 1.7–7.7)
NEUTROS PCT: 58 %
Platelet Count: 169 10*3/uL (ref 150–400)
RBC: 4.92 MIL/uL (ref 3.87–5.11)
RDW: 12.9 % (ref 11.5–15.5)
WBC: 8.9 10*3/uL (ref 4.0–10.5)
nRBC: 0 % (ref 0.0–0.2)

## 2018-04-18 LAB — CMP (CANCER CENTER ONLY)
ALK PHOS: 186 U/L — AB (ref 38–126)
ALT: 14 U/L (ref 0–44)
ANION GAP: 7 (ref 5–15)
AST: 16 U/L (ref 15–41)
Albumin: 3.8 g/dL (ref 3.5–5.0)
BUN: 15 mg/dL (ref 8–23)
CALCIUM: 10 mg/dL (ref 8.9–10.3)
CO2: 29 mmol/L (ref 22–32)
Chloride: 106 mmol/L (ref 98–111)
Creatinine: 0.74 mg/dL (ref 0.44–1.00)
GFR, Est AFR Am: >60 mL/min (ref >60)
GLUCOSE: 86 mg/dL (ref 70–99)
POTASSIUM: 4 mmol/L (ref 3.5–5.1)
Sodium: 142 mmol/L (ref 135–145)
Total Bilirubin: 0.4 mg/dL (ref 0.3–1.2)
Total Protein: 6.8 g/dL (ref 6.5–8.1)

## 2018-04-18 MED ORDER — ANASTROZOLE 1 MG PO TABS: 1.0000 mg | ORAL_TABLET | Freq: Every day | ORAL | 3 refills | Status: DC

## 2018-04-18 NOTE — Progress Notes (Signed)
[employer]  Telephone:([phone]) [phone] Fax:([phone]) [phone]  Clinic follow up Note   Patient Care Team: [name], [name], MD as PCP - General (Family Medicine) Fanny Skates, MD as Consulting Physician (General Surgery) Truitt Merle, MD as Consulting Physician (Hematology) Gery Pray, MD as Consulting Physician (Radiation Oncology) Gardenia Phlegm, NP as Nurse Practitioner (Hematology and Oncology)   Date of Service:  04/18/2018  CHIEF COMPLAINTS:  Follow up right breast cancer  Oncology History   Breast cancer of upper-outer quadrant of right female breast [facility])   Staging form: Breast, AJCC 7th Edition   - Clinical stage from 01/18/2016: Stage IA (T1b, N0, M0) - Unsigned   - Pathologic stage from 02/08/2016: Stage IA (T1c, N0, cM0) - Signed by Truitt Merle, MD on 02/27/2016      Breast cancer of upper-outer quadrant of right female breast ([place])   01/05/2016 Mammogram    Diagnostic mammogram and ultrasound showed a 9 mm mass in the upper outer quadrant of right breast, axillary nodes were negative.    01/09/2016 Initial Diagnosis    Breast cancer of upper-outer quadrant of right female breast ([place])    01/09/2016 Initial Biopsy    R breast 9:00 position biopsy showed invasive ductal carcinoma, G1-2    01/09/2016 Receptors her2    ER 90%+, PR 40%+, HER2-, Ki67 5%    02/08/2016 Surgery    Right breast lumpectomy, no SLN biopsy      02/08/2016 Pathology Results    Right breast lumpectomy showed invasive and in situ ductal carcinoma, 1.1 cm, margins were negative. Grade 1, no lymphovascular invasion. Atypical lobular hyperplasia.    02/21/2016 Genetic Testing    Genetic testing did not reveal a known deleterious mutation.  Genetic testing did identify a variant of uncertain significance (VUS) called "c.1659G>A (p.Met553Ile)" in one copy of the NBN gene.  Genes tested include: APC, ATM, AXIN2, BARD1, BMPR1A, BRCA1, BRCA2, BRIP1, CDH1, CDK4, CDKN2A, CHEK2, EPCAM, FANCC,  MLH1, MSH2, MSH6, MUTYH, NBN, PALB2, PMS2, POLD1, POLE, PTEN, RAD51C, RAD51D, SCG5/GREM1, SMAD4, STK11, TP53, VHL, and XRCC2.    02/28/2016 -  Anti-estrogen oral therapy    Anastrozole 1 mg daily      HISTORY OF PRESENTING ILLNESS:  Teresa Frey 84 y.o. female is here because of Her recently diagnosed right breast cancer. She is accompanied by her daughter [name] to our multidisciplinary clinic today.  This was discovered by screening mammogram. She never had abnormal screening mammogram or breast biopsy in the past. The diagnostic mammogram showed a 6 mm mass in the upper outer quadrant of right breast, it measured 9 mm by ultrasound. A history nodes were negative by ultrasound. She underwent ultrasound guided biopsy on 01/09/2016, which showed invasive ductal carcinoma, ER/PR positive, HER-2 negative.  She lives alone, lives with her husband, who had multiple cancer, on tube feeds, not active. She is his caregiver. She is very independent. She still works full-time for [employer]. She denies any symptoms. Her daughter [name] was diagnosed with breast cancer at age of 72. Her son died at age of 13 from alcohol. She helps to take of her grandchildren who is 21.  GYN HISTORY  Menarchal:13 LMP: late 20's  Contraceptive: a few years  HRT: no G2P2: her son died in 49's, she has a daughter   CURRENT THERAPY: anastrozole 82m daily started on 02/28/2016   INTERIM HISTORY:  Ms CCallegarireturns for follow-up for her right breast cancer. She was last seen by me 6  months ago. She presents to the clinic today by herself. She notes she is doing well. She lives by herself independently and works full time in Aeronautical engineer.  She notes she had wheezing and being winded with walking any distance. She was seen by her PCP who started her on an inhaler. She notes she smoked for 25 years. She notes he has a h/o Afib so she follow up with her cardiologist.    MEDICAL HISTORY:  Past Medical History:    Diagnosis Date  . ANXIETY 04/07/2007  . Breast cancer of upper-outer quadrant of right female breast ([place]) 01/11/2016  . COLONIC POLYPS, HX OF 04/02/2007   adenomatous  . DIVERTICULITIS, HX OF 04/02/2007  . Diverticulosis   . DJD (degenerative joint disease)   . Hemorrhoids   . HYPERTENSION 04/02/2007  . OSTEOARTHRITIS, SEVERE 04/07/2007  . 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 WEIGHT GAIN 11/13/2007    SURGICAL HISTORY: Past Surgical History:  Procedure Laterality Date  . APPENDECTOMY    . BREAST LUMPECTOMY WITH RADIOACTIVE SEED LOCALIZATION Right 02/08/2016   Procedure: RIGHT BREAST LUMPECTOMY WITH RADIOACTIVE SEED LOCALIZATION;  Surgeon: Fanny Skates, MD;  Location: [place];  Service: General;  Laterality: Right;  . TONSILLECTOMY    . TOTAL HIP ARTHROPLASTY  1999, left hip    2006 right hip  . TUBAL LIGATION      SOCIAL HISTORY: Social History   Socioeconomic History  . Marital status: Married    Spouse name: Not on file  . Number of children: 2  . Years of education: Not on file  . Highest education level: Not on file  Occupational History  . Not on file  Social Needs  . Financial resource strain: Not on file  . Food insecurity:    Worry: Not on file    Inability: Not on file  . Transportation needs:    Medical: Not on file    Non-medical: Not on file  Tobacco Use  . Smoking status: Former Smoker    Packs/day: 1.00    Years: 27.00    Pack years: 27.00    Types: Cigarettes    Last attempt to quit: 06/04/1978    Years since quitting: 39.8  . Smokeless tobacco: Never Used  Substance and Sexual Activity  . Alcohol use: Yes    Alcohol/week: 7.0 standard drinks    Types: 7 Glasses of wine per week    Comment: 1-2 glasses wine after dinner, but not necessarily each day  . Drug use: No  . Sexual activity: Not on file  Lifestyle  . Physical activity:    Days per week: Not on file    Minutes per session: Not on file  . Stress: Not on  file  Relationships  . Social connections:    Talks on phone: Not on file    Gets together: Not on file    Attends religious service: Not on file    Active member of club or organization: Not on file    Attends meetings of clubs or organizations: Not on file    Relationship status: Not on file  . Intimate partner violence:    Fear of current or ex partner: Not on file    Emotionally abused: Not on file    Physically abused: Not on file    Forced sexual activity: Not on file  Other Topics Concern  . Not on file  Social History Narrative  . Not  on file    FAMILY HISTORY: Family History  Problem Relation Age of Onset  . Pancreatic cancer Mother 35       d. 88  . Cancer Father 39       hodgkin's lymphoma and leukemia; d. 24  . Colon cancer Brother        dx. late 52s; smoker  . Pancreatic cancer Brother 80       d. 64; smoker  . Lung cancer Brother 40       smoker  . Heart attack Brother        d. late 74s  . Colon cancer Paternal Aunt   . Breast cancer Daughter 34       negative genetic testing in 2016  . Skin cancer Daughter        non-melanoma type; +sun exposure  . Kidney failure Son 15       +EtOH abuse resulting in liver, kidney, and pancreas failure  . Diabetes Maternal Uncle        d. later age  . Heart disease Neg Hx        family  . Esophageal cancer Neg Hx   . Rectal cancer Neg Hx   . Stomach cancer Neg Hx     ALLERGIES:  is allergic to tramadol.  MEDICATIONS:  Current Outpatient Medications  Medication Sig Dispense Refill  . albuterol (PROVENTIL HFA;VENTOLIN HFA) 108 (90 Base) MCG/ACT inhaler Inhale 2 puffs into the lungs every 6 (six) hours as needed for wheezing or shortness of breath. 1 Inhaler 0  . anastrozole (ARIMIDEX) 1 MG tablet Take 1 tablet (1 mg total) by mouth daily. 90 tablet 3  . Ascorbic Acid (VITAMIN C) 1000 MG tablet Take 1,000 mg by mouth daily.    Marland Kitchen BIOTIN 5000 PO Take 1 tablet by mouth daily.    . Calcium Carb-Cholecalciferol  (CALCIUM 1000 + D PO) Take 1 tablet by mouth daily.    . hydrochlorothiazide (MICROZIDE) 12.5 MG capsule TAKE 1 CAPSULE BY MOUTH DAILY. 30 capsule 3  . metoprolol tartrate (LOPRESSOR) 25 MG tablet Take 1 tablet (25 mg total) by mouth daily. TAKE 1 TABLET BY MOUTH DAILY 30 tablet 6  . Multiple Vitamins-Minerals (CENTRUM SILVER ADULT 50+) TABS Take 1 tablet by mouth daily.    Marland Kitchen omega-3 acid ethyl esters (LOVAZA) 1 g capsule Take 1 g by mouth daily.     . Phenazopyridine HCl (AZO TABS PO) Take 1 capsule by mouth daily as needed (UTI).    Marland Kitchen sertraline (ZOLOFT) 50 MG tablet Take 1 tablet (50 mg total) by mouth daily. 90 tablet 2  . vitamin B-12 (CYANOCOBALAMIN) 1000 MCG tablet Take 1,000 mcg by mouth daily.    Marland Kitchen warfarin (COUMADIN) 5 MG tablet Take 31m (3 tablets) on Tuesdays and Thursdays, take 10 mg (2 tablets) all other days of the week or as directed by coumadin clinic 90 tablet 4   No current facility-administered medications for this visit.     REVIEW OF SYSTEMS:   Constitutional: Denies fevers, chills, abnormal night sweats or hot flashes Eyes: Denies blurriness of vision, double vision or watery eyes Ears, nose, mouth, throat, and face: Denies mucositis or sore throat Respiratory: Denies cough, dyspnea or wheezes (+) SOB with exertion Cardiovascular: Denies palpitation, chest discomfort or lower extremity swelling Gastrointestinal:  Denies nausea, heartburn or change in bowel habits Skin: Denies abnormal skin rashes Lymphatics: Denies new lymphadenopathy or easy bruising Musculoskeletal:  chronic left hip pain (+) arthritis in  her lower back and stiffness in b/l hips and fingers Neurological:Denies numbness, tingling or new weaknesses Behavioral/Psych: Mood is stable, no new changes  All other systems were reviewed with the patient and are negative.  PHYSICAL EXAMINATION: ECOG PERFORMANCE STATUS: 1  Vitals:   04/18/18 1100  BP: (!) 157/66  Pulse: (!) 56  Resp: 18  Temp: 98.1 F  (36.7 C)  SpO2: 94%   Filed Weights   04/18/18 1100  Weight: 163 lb 1.6 oz (74 kg)    GENERAL:alert, no distress and comfortable SKIN: skin color, texture, turgor are normal, no rashes or significant lesions (+) skin tag and mole on her abdomen EYES: normal, conjunctiva are pink and non-injected, sclera clear OROPHARYNX:no exudate, no erythema and lips, buccal mucosa, and tongue normal  NECK: supple, thyroid normal size, non-tender, without nodularity LYMPH:  no palpable lymphadenopathy in the cervical, axillary or inguinal LUNGS: clear to auscultation and percussion with normal breathing effort HEART: regular rate & rhythm and no murmurs and no lower extremity edema ABDOMEN:abdomen soft, non-tender and normal bowel sounds Musculoskeletal:no cyanosis of digits and no clubbing  PSYCH: alert & oriented x 3 with fluent speech NEURO: no focal motor/sensory deficits Breasts: S/p Right breast lumpectomy: (+) Very well healed surgical incision with no scar tissue in right breast around aerola, no other adenopathy or mass appreciated.     LABORATORY DATA:  I have reviewed the data as listed. CBC Latest Ref Rng & Units 04/18/2018 10/18/2017 06/28/2017  WBC 4.0 - 10.5 K/uL 8.9 8.8 14.2(H)  Hemoglobin 12.0 - 15.0 g/dL 14.6 14.4 14.4  Hematocrit 36.0 - 46.0 % 45.7 43.5 42.6  Platelets 150 - 400 K/uL 169 198 156   CMP Latest Ref Rng & Units 04/18/2018 10/18/2017 06/28/2017  Glucose 70 - 99 mg/dL 86 89 135(H)  BUN 8 - 23 mg/dL 15 20 22(H)  Creatinine 0.44 - 1.00 mg/dL 0.74 0.82 0.86  Sodium 135 - 145 mmol/L 142 143 139  Potassium 3.5 - 5.1 mmol/L 4.0 4.4 3.5  Chloride 98 - 111 mmol/L 106 105 103  CO2 22 - 32 mmol/L 29 30(H) 23  Calcium 8.9 - 10.3 mg/dL 10.0 10.0 9.3  Total Protein 6.5 - 8.1 g/dL 6.8 6.9 6.5  Total Bilirubin 0.3 - 1.2 mg/dL 0.4 0.4 1.0  Alkaline Phos 38 - 126 U/L 186(H) 200(H) 127(H)  AST 15 - 41 U/L _0 ALT 0 - 44 U/L _1 PATHOLOGY REPORT  Diagnosis  01/09/2016 Breast, right, needle core biopsy, 9 o'clock 3 cm fn INVASIVE DUCTAL CARCINOMA, GRADE 1 TO 2  Diagnosis 02/08/2016 Breast, lumpectomy, Right - INVASIVE AND IN SITU DUCTAL CARCINOMA, 1.1 CM. - MARGINS NOT INVOLVED. - FIBROCYSTIC CHANGES WITH CALCIFICATIONS. - LOBULAR NEOPLASIA (ATYPICAL LOBULAR HYPERPLASIA). - PREVIOUS BIOPSY SITE.   RADIOGRAPHIC STUDIES: I have personally reviewed the radiological images as listed and agreed with the findings in the report. No results found.  Her outside mammogram and ultrasound image in report were reviewed.  Diagnostic Mammogram 12/26/17  IMPRESSION There is no mammographic evidence of malignancy.    Bone Density 01/22/17 Normal with lowest T-Score of -0.9 at [facility]   Mammogram 12/20/16 at [facility]  Patient reports mammogram was clear  Diagnostic mammogram and ultrasound of right breast 01/05/2016 Breast composition Repeat. There is a new 6 mm irregular high density architecture distortion in the right breast 10:00 middle tabs. It is measured 9 mm meter by ultrasound. Right axillary was negative for adenopathy. The  mass is highly suspicious for malignancy.  ASSESSMENT & PLAN: 84 y.o. Caucasian female, with screening discovered right breast cancer.  1. Breast cancer of upper-outer quadrant of right breast, invasive ductal carcinoma, pT1CN0M0, stage IA, ER+/PR+/HER2- -I previously reviewed her surgical pathology findings with patient in great details. -She has early-stage breast cancer, had a complete surgical resection. -Giving the low Ki-67 and agree 1 disease, I think this is likely a low risk cancer. I do not recommend adjuvant chemotherapy or Oncotype --she has started adjuvant anastrozole the end of September 2017, tolerating well overall, she has developed mild arthralgia especially in her hands, overall stable, likely related to anastrozole, it is manageable, we'll continue for now. I have low threshold to switch her to tamoxifen  or stop adjuvant antiestrogen therapy if her arthralgia is much worse, due to her advanced age. -I encourage her to exercise, which will likely help her arthralgia. -We will continue Anastrozole for 5 years total. -She is clinically doing well.  Mild arthralgia stable.  Lab reviewed, CBC and CMP is WNL except stable alk phos at 186, continue to monitor. Her physical exam and her 12/2017 Mammogram was unremarkable. There is no clinical concern for recurrence. -Will order mammogram and DEXA at next visit.  -She will continue Anastrozole, refilled today  -F/u in 6 months   2. Genetics -Her daughter was diagnosed with breast cancer at age of 21. -She has strong family history of colon cancer, pancreatic cancer, etc. I'll refer her to genetic counseling to ruled out inch syndrome or other inheritable cancer syndrome. -Her genetic testing was negative  3. HTN, AFib  -She was on hydrochlorothiazide for hypertension before, which was held when she had a 2 fibrillation. She is on low-dose metoprolol now.  -Her blood pressure has been significantly elevated daily, I strongly encouraged her to follow-up with her primary care physician as soon as possible  -Her blood pressure has been high in my office, I strongly encouraged her to monitor at home, and contact her primary care physician if remains to be high. -Given her Afib episode on 06/28/17, she was placed on Lopressor 12.72m BID and Eliquis 5 mg BID  4. Arthralgia, Joint Stiffness -Chronic left hip pain. Follows with orthopedics.  -Taking meloxicam -Joint pain has slightly increased with anastrozole -I previously discussed anastrozole can increase her joint stiffness. I encouraged her to keep walking and be active with some exercise.  -overall stable lately   6. Bone health -I previously spoke with the patient about the risk for decreased strength in bones with anastrozole -Encouraged her to begin taking calcium and Vitamin D supplements -01/22/17  DEXA was normal   Plan  -Clinically doing well  -Continue anastrozole, refilled today  -Lab and f/u in 6 months    All questions were answered. The patient knows to call the clinic with any problems, questions or concerns. I spent 15 minutes counseling the patient face to face. The total time spent in the appointment was 20 minutes and more than 50% was on counseling.  IOneal Deputy am acting as scribe for YTruitt Merle MD.   I have reviewed the above documentation for accuracy and completeness, and I agree with the above.     YTruitt Merle MD 04/18/2018

## 2018-04-18 NOTE — Telephone Encounter (Signed)
Scheduled appt per 11/15 los - gave patient AVS and calender per los.   

## 2018-04-19 ENCOUNTER — Emergency Department (HOSPITAL_COMMUNITY): Payer: Medicare Other

## 2018-04-19 ENCOUNTER — Inpatient Hospital Stay (HOSPITAL_COMMUNITY)
Admission: EM | Admit: 2018-04-19 | Discharge: 2018-04-24 | DRG: 481 | Disposition: A | Discharge status: Skilled Nursing Facility | Payer: Medicare Other | Attending: Internal Medicine | Admitting: Internal Medicine

## 2018-04-19 ENCOUNTER — Encounter (HOSPITAL_COMMUNITY): Payer: Self-pay

## 2018-04-19 ENCOUNTER — Other Ambulatory Visit: Payer: Self-pay

## 2018-04-19 DIAGNOSIS — S0990XA Unspecified injury of head, initial encounter: Secondary | ICD-10-CM | POA: Diagnosis not present

## 2018-04-19 DIAGNOSIS — Z8719 Personal history of other diseases of the digestive system: Secondary | ICD-10-CM | POA: Diagnosis not present

## 2018-04-19 DIAGNOSIS — Z803 Family history of malignant neoplasm of breast: Secondary | ICD-10-CM

## 2018-04-19 DIAGNOSIS — W19XXXA Unspecified fall, initial encounter: Secondary | ICD-10-CM

## 2018-04-19 DIAGNOSIS — Z7901 Long term (current) use of anticoagulants: Secondary | ICD-10-CM | POA: Diagnosis not present

## 2018-04-19 DIAGNOSIS — Z801 Family history of malignant neoplasm of trachea, bronchus and lung: Secondary | ICD-10-CM

## 2018-04-19 DIAGNOSIS — F329 Major depressive disorder, single episode, unspecified: Secondary | ICD-10-CM | POA: Diagnosis present

## 2018-04-19 DIAGNOSIS — Z9889 Other specified postprocedural states: Secondary | ICD-10-CM | POA: Diagnosis not present

## 2018-04-19 DIAGNOSIS — I10 Essential (primary) hypertension: Secondary | ICD-10-CM | POA: Diagnosis not present

## 2018-04-19 DIAGNOSIS — Z808 Family history of malignant neoplasm of other organs or systems: Secondary | ICD-10-CM

## 2018-04-19 DIAGNOSIS — Z807 Family history of other malignant neoplasms of lymphoid, hematopoietic and related tissues: Secondary | ICD-10-CM

## 2018-04-19 DIAGNOSIS — I361 Nonrheumatic tricuspid (valve) insufficiency: Secondary | ICD-10-CM | POA: Diagnosis not present

## 2018-04-19 DIAGNOSIS — W010XXA Fall on same level from slipping, tripping and stumbling without subsequent striking against object, initial encounter: Secondary | ICD-10-CM | POA: Diagnosis present

## 2018-04-19 DIAGNOSIS — Z853 Personal history of malignant neoplasm of breast: Secondary | ICD-10-CM | POA: Diagnosis not present

## 2018-04-19 DIAGNOSIS — S7292XA Unspecified fracture of left femur, initial encounter for closed fracture: Secondary | ICD-10-CM

## 2018-04-19 DIAGNOSIS — M978XXA Periprosthetic fracture around other internal prosthetic joint, initial encounter: Secondary | ICD-10-CM | POA: Diagnosis not present

## 2018-04-19 DIAGNOSIS — Z419 Encounter for procedure for purposes other than remedying health state, unspecified: Secondary | ICD-10-CM | POA: Diagnosis not present

## 2018-04-19 DIAGNOSIS — S72302A Unspecified fracture of shaft of left femur, initial encounter for closed fracture: Principal | ICD-10-CM | POA: Diagnosis present

## 2018-04-19 DIAGNOSIS — Z806 Family history of leukemia: Secondary | ICD-10-CM

## 2018-04-19 DIAGNOSIS — S3993XA Unspecified injury of pelvis, initial encounter: Secondary | ICD-10-CM | POA: Diagnosis not present

## 2018-04-19 DIAGNOSIS — R52 Pain, unspecified: Secondary | ICD-10-CM

## 2018-04-19 DIAGNOSIS — M9702XD Periprosthetic fracture around internal prosthetic left hip joint, subsequent encounter: Secondary | ICD-10-CM | POA: Diagnosis not present

## 2018-04-19 DIAGNOSIS — Z7401 Bed confinement status: Secondary | ICD-10-CM | POA: Diagnosis not present

## 2018-04-19 DIAGNOSIS — Z800 Family history of malignant neoplasm of digestive organs: Secondary | ICD-10-CM | POA: Diagnosis not present

## 2018-04-19 DIAGNOSIS — Z86010 Personal history of colonic polyps: Secondary | ICD-10-CM | POA: Diagnosis not present

## 2018-04-19 DIAGNOSIS — D62 Acute posthemorrhagic anemia: Secondary | ICD-10-CM | POA: Diagnosis not present

## 2018-04-19 DIAGNOSIS — R110 Nausea: Secondary | ICD-10-CM | POA: Diagnosis not present

## 2018-04-19 DIAGNOSIS — R531 Weakness: Secondary | ICD-10-CM | POA: Diagnosis not present

## 2018-04-19 DIAGNOSIS — C50411 Malignant neoplasm of upper-outer quadrant of right female breast: Secondary | ICD-10-CM | POA: Diagnosis not present

## 2018-04-19 DIAGNOSIS — Z833 Family history of diabetes mellitus: Secondary | ICD-10-CM

## 2018-04-19 DIAGNOSIS — S72352A Displaced comminuted fracture of shaft of left femur, initial encounter for closed fracture: Secondary | ICD-10-CM | POA: Diagnosis not present

## 2018-04-19 DIAGNOSIS — F419 Anxiety disorder, unspecified: Secondary | ICD-10-CM | POA: Diagnosis present

## 2018-04-19 DIAGNOSIS — R0602 Shortness of breath: Secondary | ICD-10-CM | POA: Diagnosis not present

## 2018-04-19 DIAGNOSIS — S161XXA Strain of muscle, fascia and tendon at neck level, initial encounter: Secondary | ICD-10-CM | POA: Diagnosis not present

## 2018-04-19 DIAGNOSIS — I482 Chronic atrial fibrillation, unspecified: Secondary | ICD-10-CM | POA: Diagnosis present

## 2018-04-19 DIAGNOSIS — I4891 Unspecified atrial fibrillation: Secondary | ICD-10-CM | POA: Diagnosis present

## 2018-04-19 DIAGNOSIS — R609 Edema, unspecified: Secondary | ICD-10-CM | POA: Diagnosis not present

## 2018-04-19 DIAGNOSIS — M6281 Muscle weakness (generalized): Secondary | ICD-10-CM | POA: Diagnosis not present

## 2018-04-19 DIAGNOSIS — M2550 Pain in unspecified joint: Secondary | ICD-10-CM | POA: Diagnosis not present

## 2018-04-19 DIAGNOSIS — R0902 Hypoxemia: Secondary | ICD-10-CM | POA: Diagnosis not present

## 2018-04-19 DIAGNOSIS — E785 Hyperlipidemia, unspecified: Secondary | ICD-10-CM | POA: Diagnosis present

## 2018-04-19 DIAGNOSIS — Y92019 Unspecified place in single-family (private) house as the place of occurrence of the external cause: Secondary | ICD-10-CM | POA: Diagnosis not present

## 2018-04-19 DIAGNOSIS — I959 Hypotension, unspecified: Secondary | ICD-10-CM | POA: Diagnosis not present

## 2018-04-19 DIAGNOSIS — Z87891 Personal history of nicotine dependence: Secondary | ICD-10-CM | POA: Diagnosis not present

## 2018-04-19 DIAGNOSIS — F339 Major depressive disorder, recurrent, unspecified: Secondary | ICD-10-CM | POA: Diagnosis not present

## 2018-04-19 DIAGNOSIS — Z8249 Family history of ischemic heart disease and other diseases of the circulatory system: Secondary | ICD-10-CM

## 2018-04-19 DIAGNOSIS — M9702XA Periprosthetic fracture around internal prosthetic left hip joint, initial encounter: Secondary | ICD-10-CM

## 2018-04-19 DIAGNOSIS — Z96641 Presence of right artificial hip joint: Secondary | ICD-10-CM | POA: Diagnosis not present

## 2018-04-19 DIAGNOSIS — Z170 Estrogen receptor positive status [ER+]: Secondary | ICD-10-CM | POA: Diagnosis not present

## 2018-04-19 DIAGNOSIS — M1990 Unspecified osteoarthritis, unspecified site: Secondary | ICD-10-CM | POA: Diagnosis not present

## 2018-04-19 DIAGNOSIS — M79605 Pain in left leg: Secondary | ICD-10-CM | POA: Diagnosis not present

## 2018-04-19 DIAGNOSIS — S72392D Other fracture of shaft of left femur, subsequent encounter for closed fracture with routine healing: Secondary | ICD-10-CM | POA: Diagnosis not present

## 2018-04-19 DIAGNOSIS — S161XXD Strain of muscle, fascia and tendon at neck level, subsequent encounter: Secondary | ICD-10-CM | POA: Diagnosis not present

## 2018-04-19 DIAGNOSIS — R262 Difficulty in walking, not elsewhere classified: Secondary | ICD-10-CM | POA: Diagnosis not present

## 2018-04-19 DIAGNOSIS — D500 Iron deficiency anemia secondary to blood loss (chronic): Secondary | ICD-10-CM | POA: Diagnosis not present

## 2018-04-19 DIAGNOSIS — R7302 Impaired glucose tolerance (oral): Secondary | ICD-10-CM | POA: Diagnosis not present

## 2018-04-19 DIAGNOSIS — S0990XD Unspecified injury of head, subsequent encounter: Secondary | ICD-10-CM | POA: Diagnosis not present

## 2018-04-19 DIAGNOSIS — W19XXXD Unspecified fall, subsequent encounter: Secondary | ICD-10-CM | POA: Diagnosis not present

## 2018-04-19 LAB — COMPREHENSIVE METABOLIC PANEL
ALBUMIN: 3.8 g/dL (ref 3.5–5.0)
ALT: 16 U/L (ref 0–44)
ANION GAP: 8 (ref 5–15)
AST: 19 U/L (ref 15–41)
Alkaline Phosphatase: 149 U/L — ABNORMAL HIGH (ref 38–126)
BILIRUBIN TOTAL: 0.7 mg/dL (ref 0.3–1.2)
BUN: 17 mg/dL (ref 8–23)
CHLORIDE: 106 mmol/L (ref 98–111)
CO2: 25 mmol/L (ref 22–32)
Calcium: 9.3 mg/dL (ref 8.9–10.3)
Creatinine, Ser: 0.75 mg/dL (ref 0.44–1.00)
GFR calc Af Amer: >60 mL/min (ref >60)
GFR calc non Af Amer: >60 mL/min (ref >60)
GLUCOSE: 180 mg/dL — AB (ref 70–99)
POTASSIUM: 3.7 mmol/L (ref 3.5–5.1)
SODIUM: 139 mmol/L (ref 135–145)
TOTAL PROTEIN: 6.6 g/dL (ref 6.5–8.1)

## 2018-04-19 LAB — CBC WITH DIFFERENTIAL/PLATELET
ABS IMMATURE GRANULOCYTES: 0.12 10*3/uL — AB (ref 0.00–0.07)
BASOS PCT: 0 %
Basophils Absolute: 0 10*3/uL (ref 0.0–0.1)
Eosinophils Absolute: 0 10*3/uL (ref 0.0–0.5)
Eosinophils Relative: 0 %
HCT: 43.2 % (ref 36.0–46.0)
Hemoglobin: 14 g/dL (ref 12.0–15.0)
IMMATURE GRANULOCYTES: 1 %
Lymphocytes Relative: 12 %
Lymphs Abs: 2.2 10*3/uL (ref 0.7–4.0)
MCH: 29.9 pg (ref 26.0–34.0)
MCHC: 32.4 g/dL (ref 30.0–36.0)
MCV: 92.1 fL (ref 80.0–100.0)
Monocytes Absolute: 1.1 10*3/uL — ABNORMAL HIGH (ref 0.1–1.0)
Monocytes Relative: 6 %
NEUTROS ABS: 15.4 10*3/uL — AB (ref 1.7–7.7)
NEUTROS PCT: 81 %
PLATELETS: 214 10*3/uL (ref 150–400)
RBC: 4.69 MIL/uL (ref 3.87–5.11)
RDW: 13 % (ref 11.5–15.5)
WBC: 18.9 10*3/uL — ABNORMAL HIGH (ref 4.0–10.5)
nRBC: 0 % (ref 0.0–0.2)

## 2018-04-19 LAB — PROTIME-INR
INR: 2.31
PROTHROMBIN TIME: 25.1 s — AB (ref 11.4–15.2)

## 2018-04-19 MED ORDER — FENTANYL CITRATE (PF) 100 MCG/2ML IJ SOLN
100.0000 ug | INTRAMUSCULAR | Status: DC | PRN
  Administered 2018-04-19: 100 ug via INTRAVENOUS
  Filled 2018-04-19: qty 2

## 2018-04-19 MED ORDER — FENTANYL CITRATE (PF) 100 MCG/2ML IJ SOLN
100.0000 ug | Freq: Once | INTRAMUSCULAR | Status: AC
  Administered 2018-04-19: 100 ug via INTRAVENOUS
  Filled 2018-04-19: qty 2

## 2018-04-19 MED ORDER — HYDROCODONE-ACETAMINOPHEN 5-325 MG PO TABS
1.0000 | ORAL_TABLET | Freq: Four times a day (QID) | ORAL | Status: DC | PRN
  Administered 2018-04-20: 1 via ORAL
  Administered 2018-04-20 – 2018-04-21 (×2): 2 via ORAL
  Filled 2018-04-19 (×2): qty 2
  Filled 2018-04-19: qty 1

## 2018-04-19 MED ORDER — MORPHINE SULFATE (PF) 2 MG/ML IV SOLN
0.5000 mg | INTRAVENOUS | Status: DC | PRN
  Administered 2018-04-19 – 2018-04-20 (×2): 0.5 mg via INTRAVENOUS
  Filled 2018-04-19 (×2): qty 1

## 2018-04-19 NOTE — ED Triage Notes (Signed)
Arrives from home, about 1530 lost balance and fell into A/C unit, hit about mid shaft of femur, hx femur fx 34 years ago and had surgery, feels like the band snapped. Crawled around house until she could reach phone. In C-collar on arrival. L leg deformed and swelling.  100 mcg fentnyl and 4 zofran given en route. Aox4 on arrival.

## 2018-04-19 NOTE — Progress Notes (Signed)
Orthopedic Tech Progress Note Patient Details:  Teresa Frey 11-07-1929 [id number]      Post Interventions Patient Tolerated: Well Instructions Provided: Adjustment of device, Care of device   Melony Overly T 04/19/2018, 8:22 PM

## 2018-04-19 NOTE — ED Provider Notes (Signed)
Emergency Department Provider Note   I have reviewed the triage vital signs and the nursing notes.   HISTORY  Chief Complaint Leg Injury   HPI Teresa Frey is a 84 y.o. female with PMH of HTN, DJD, and prior femur fracture presents to the ED after fall at home.  The patient arrived by EMS.  She had the loss of balance with fall and tripping over her floor AC unit approximately 15:30 today.  She reports severe pain in the left thigh area but denies any numbness or tingling.  She had to pull herself around the apartment until she was able to reach a phone to call for help.  Paramedics arrived and placed the patient in a c-collar.  She was given 100 mcg of fentanyl and 4 mg of Zofran in route.  She denies any head trauma or neck pain.  Denies chest pain, abdominal pain, or discomfort in the arms/legs. Patient is on Coumadin for A-fib. No LOC or confusion since the fall. No pain in the ankle or contralateral leg.   Patient repots seeing [facility] in the past. Does have remote femur fracture history (34 years prior).   Past Medical History:  Diagnosis Date  . ANXIETY 04/07/2007  . Breast cancer of upper-outer quadrant of right female breast ([place]) 01/11/2016  . COLONIC POLYPS, HX OF 04/02/2007   adenomatous  . DIVERTICULITIS, HX OF 04/02/2007  . Diverticulosis   . DJD (degenerative joint disease)   . Hemorrhoids   . HYPERTENSION 04/02/2007  . OSTEOARTHRITIS, SEVERE 04/07/2007  . 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 WEIGHT GAIN 11/13/2007    Patient Active Problem List   Diagnosis Date Noted  . Periprosthetic fracture around internal prosthetic left hip joint ([place]) 04/19/2018  .  term (current) use of anticoagulants [Z79.01] 08/14/2017  . Genetic testing 04/01/2016  . possible sleep apnea by history 02/12/2016  . A-fib ([place]) 02/11/2016  . Surgical wound infection 02/11/2016  . Breast cancer of upper-outer quadrant of right female breast ([place]) 01/11/2016  .  Impaired glucose tolerance 07/28/2014  . Anxiety disorder, unspecified 04/07/2007  . Osteoarthritis 04/07/2007  . Essential hypertension 04/02/2007  . History of colonic polyps 04/02/2007  . DIVERTICULITIS, HX OF 04/02/2007    Past Surgical History:  Procedure Laterality Date  . APPENDECTOMY    . BREAST LUMPECTOMY WITH RADIOACTIVE SEED LOCALIZATION Right 02/08/2016   Procedure: RIGHT BREAST LUMPECTOMY WITH RADIOACTIVE SEED LOCALIZATION;  Surgeon: Fanny Skates, MD;  Location: [facility];  Service: General;  Laterality: Right;  . TONSILLECTOMY    . TOTAL HIP ARTHROPLASTY  1999, left hip    2006 right hip  . TUBAL LIGATION      Allergies Tramadol  Family History  Problem Relation Age of Onset  . Pancreatic cancer Mother 63       d. 45  . Cancer Father 74       hodgkin's lymphoma and leukemia; d. 80  . Colon cancer Brother        dx. late 68s; smoker  . Pancreatic cancer Brother 80       d. 26; smoker  . Lung cancer Brother 40       smoker  . Heart attack Brother        d. late 31s  . Colon cancer Paternal Aunt   . Breast cancer Daughter 33       negative genetic testing in 2016  . Skin cancer Daughter  non-melanoma type; +sun exposure  . Kidney failure Son 5       +EtOH abuse resulting in liver, kidney, and pancreas failure  . Diabetes Maternal Uncle        d. later age  . Heart disease Neg Hx        family  . Esophageal cancer Neg Hx   . Rectal cancer Neg Hx   . Stomach cancer Neg Hx     Social History Social History   Tobacco Use  . Smoking status: Former Smoker    Packs/day: 1.00    Years: 27.00    Pack years: 27.00    Types: Cigarettes    Last attempt to quit: 06/04/1978    Years since quitting: 39.9  . Smokeless tobacco: Never Used  Substance Use Topics  . Alcohol use: Yes    Alcohol/week: 7.0 standard drinks    Types: 7 Glasses of wine per week    Comment: 1-2 glasses wine after dinner, but not necessarily each day  . Drug  use: No    Review of Systems  Constitutional: No fever/chills Eyes: No visual changes. ENT: No sore throat. Cardiovascular: Denies chest pain. Respiratory: Denies shortness of breath. Gastrointestinal: No abdominal pain.  No nausea, no vomiting.  No diarrhea.  No constipation. Genitourinary: Negative for dysuria. Musculoskeletal: Positive severe left leg pain.  Skin: Negative for rash. Neurological: Negative for headaches, focal weakness or numbness.  10-point ROS otherwise negative.  ____________________________________________   PHYSICAL EXAM:  VITAL SIGNS: ED Triage Vitals  Enc Vitals Group     BP 04/19/18 1813 (!) 133/57     Pulse Rate 04/19/18 1813 65     Resp 04/19/18 1813 (!) 9     Temp 04/19/18 1813 98.1 F (36.7 C)     Temp Source 04/19/18 1813 Oral     SpO2 04/19/18 1813 92 %     Weight 04/19/18 1829 161 lb (73 kg)     Height 04/19/18 1829 5\' 3"  (1.6 m)     Pain Score 04/19/18 1827 5   Constitutional: Alert and oriented. Well appearing and in no acute distress. Eyes: Conjunctivae are normal. PERRL.  Head: Atraumatic. Nose: No congestion/rhinnorhea. Mouth/Throat: Mucous membranes are moist.  Neck: No stridor. C-collar in place.  Cardiovascular: Normal rate, regular rhythm. Good peripheral circulation. Grossly normal heart sounds. Palpable DP/PT on the left/right.  Respiratory: Normal respiratory effort.  No retractions. Lungs CTAB. Gastrointestinal: Soft and nontender. No distention.  Musculoskeletal: Mid-thigh tenderness on the left. Swelling but compartments are soft. No evidence of open fracture. No knee or ankle tenderness on exam. No tenderness to palpation over the pelvis. Pulse intact distally. Normal ROM of bilaterally upper extremities with no bony tenderness.  Neurologic:  Normal speech and language. No gross focal neurologic deficits are appreciated.  Skin:  Skin is warm, dry and intact. No rash  noted.  ____________________________________________   LABS (all labs ordered are listed, but only abnormal results are displayed)  Labs Reviewed  COMPREHENSIVE METABOLIC PANEL - Abnormal; Notable for the following components:      Result Value   Glucose, Bld 180 (*)    Alkaline Phosphatase 149 (*)    All other components within normal limits  CBC WITH DIFFERENTIAL/PLATELET - Abnormal; Notable for the following components:   WBC 18.9 (*)    Neutro Abs 15.4 (*)    Monocytes Absolute 1.1 (*)    Abs Immature Granulocytes 0.12 (*)    All other components within normal limits  PROTIME-INR - Abnormal; Notable for the following components:   Prothrombin Time 25.1 (*)    All other components within normal limits   ____________________________________________  RADIOLOGY  Dg Pelvis Portable  Result Date: 04/19/2018 CLINICAL DATA:  Fall with left leg pain EXAM: PORTABLE PELVIS 1-2 VIEWS COMPARISON:  03/04/2017 FINDINGS: Coarse calcification in the pelvis may reflect fibroid. Status post bilateral hip replacement with normal alignment. Pubic symphysis and rami appear intact. IMPRESSION: Status post bilateral hip replacement. No definite acute osseous abnormality. Electronically Signed   By: Donavan Foil M.D.   On: 04/19/2018 19:28   Dg Chest Portable 1 View  Result Date: 04/19/2018 CLINICAL DATA:  Elevated WBC, shortness of breath EXAM: PORTABLE CHEST 1 VIEW COMPARISON:  06/28/2017 FINDINGS: There is no focal consolidation. There is chronic bilateral interstitial thickening. The lungs are hyperinflated likely secondary to COPD. There is no pleural effusion or pneumothorax. There is stable cardiomegaly. The osseous structures are unremarkable. IMPRESSION: No acute cardiopulmonary disease. Electronically Signed   By: Kathreen Devoid   On: 04/19/2018 21:22   Dg Femur Port 1v Left  Result Date: 04/19/2018 CLINICAL DATA:  Fall with leg pain EXAM: LEFT FEMUR PORTABLE 1 VIEW COMPARISON:  None.  FINDINGS: Status post left hip replacement. Acute mildly comminuted fracture at the mid to distal shaft of the femur, just caudal to the tip of the femoral prosthesis. Close to 1 bone with of anterior and lateral displacement of dominant fracture fragment. IMPRESSION: Acute, mildly comminuted and displaced fracture involving the mid to distal shaft of the left femur, just caudal to the tip of the femoral prosthesis. Electronically Signed   By: Donavan Foil M.D.   On: 04/19/2018 19:30    ____________________________________________   PROCEDURES  Procedure(s) performed:   Procedures  None ____________________________________________   INITIAL IMPRESSION / ASSESSMENT AND PLAN / ED COURSE  Pertinent labs & imaging results that were available during my care of the patient were reviewed by me and considered in my medical decision making (see chart for details).  She presents to the emergency department for evaluation after mechanical fall.  I suspect that she has a femur fracture on the left.  She has intact pulses is currently seated in a position of comfort.  Close were removed and the patient was not found to have evidence of compartment syndrome clinically or open fracture.  Plan for portable plain films of the left femur, hip, chest.  I cannot clear the patient's cervical spine clinically at this time as she may have a distracting injury but low suspicion for this with ground-level fall.   07:00 PM Assisted radiology tech with portable plain films. Will discuss femur fx with ortho. Pulses and sensation remain intact in the left foot after imaging.   07:35 PM Spoke with Dr. Erlinda Hong regarding the fracture. Plan for 10 lbs bucks traction and he will review the plain film in more detail. Patient without pain on my re-evaluation. I palpated the cervical spine with no discomfort. She has full ROM of the neck without pain. She is not experiencing any pain in the leg at this time. Plan for clinical  clearance of the c-spine at this time.   08:00 PM Dr. Erlinda Hong plans for OR on Monday.   Discussed patient's case with Hospitalist, Dr. Alcario Drought to request admission. Patient and family (if present) updated with plan. Care transferred to Hospitalist service.  I reviewed all nursing notes, vitals, pertinent old records, EKGs, labs, imaging (as available).  ____________________________________________  FINAL  CLINICAL IMPRESSION(S) / ED DIAGNOSES  Final diagnoses:  Closed fracture of left femur, unspecified fracture morphology, unspecified portion of femur, initial encounter ([place])  Injury of head, initial encounter  Strain of neck muscle, initial encounter  Fall from standing, initial encounter     MEDICATIONS GIVEN DURING THIS VISIT:  Medications  fentaNYL (SUBLIMAZE) injection 100 mcg (100 mcg Intravenous Given 04/19/18 1925)  HYDROcodone-acetaminophen (NORCO/VICODIN) 5-325 MG per tablet 1-2 tablet (has no administration in time range)  morphine 2 MG/ML injection 0.5 mg (0.5 mg Intravenous Given 04/19/18 2309)  fentaNYL (SUBLIMAZE) injection 100 mcg (100 mcg Intravenous Given 04/19/18 1825)    Note:  This document was prepared using Dragon voice recognition software and may include unintentional dictation errors.  Nanda Quinton, MD Emergency Medicine   , [facility], MD 04/19/18 [phone]

## 2018-04-19 NOTE — ED Notes (Signed)
Ortho tech paged  

## 2018-04-19 NOTE — H&P (Signed)
History and Physical    Teresa Frey SHF:[id number] DOB: [birth year] DOA: 04/19/2018  PCP: Teresa, [name], MD  Patient coming from: Home  I have personally briefly reviewed patient's old medical records in [place]  Chief Complaint: Fall L hip pain  HPI: Teresa Frey is a 84 y.o. female with medical history significant of A.Fib on coumadin, HTN.  Patient presents to the ED with c/o L hip pain following mechanical fall at home.  Tripped.  L femur deformity and inability to bear weight following fall.   ED Course: L femur fx distal to hip replacement prosthesis.   Review of Systems: As per HPI otherwise 10 point review of systems negative.   Past Medical History:  Diagnosis Date  . ANXIETY 04/07/2007  . Breast cancer of upper-outer quadrant of right female breast ([place]) 01/11/2016  . COLONIC POLYPS, HX OF 04/02/2007   adenomatous  . DIVERTICULITIS, HX OF 04/02/2007  . Diverticulosis   . DJD (degenerative joint disease)   . Hemorrhoids   . HYPERTENSION 04/02/2007  . OSTEOARTHRITIS, SEVERE 04/07/2007  . 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 WEIGHT GAIN 11/13/2007    Past Surgical History:  Procedure Laterality Date  . APPENDECTOMY    . BREAST LUMPECTOMY WITH RADIOACTIVE SEED LOCALIZATION Right 02/08/2016   Procedure: RIGHT BREAST LUMPECTOMY WITH RADIOACTIVE SEED LOCALIZATION;  Surgeon: Teresa Skates, MD;  Location: [facility];  Service: General;  Laterality: Right;  . TONSILLECTOMY    . TOTAL HIP ARTHROPLASTY  1999, left hip    2006 right hip  . TUBAL LIGATION       reports that she quit smoking about 39 years ago. Her smoking use included cigarettes. She has a 27.00 pack-year smoking history. She has never used smokeless tobacco. She reports that she drinks about 7.0 standard drinks of alcohol per week. She reports that she does not use drugs.  Allergies  Allergen Reactions  . Tramadol     Loss of control    Family History  Problem  Relation Age of Onset  . Pancreatic cancer Mother 76       d. 55  . Cancer Father 23       hodgkin's lymphoma and leukemia; d. 58  . Colon cancer Brother        dx. late 32s; smoker  . Pancreatic cancer Brother 80       d. 59; smoker  . Lung cancer Brother 40       smoker  . Heart attack Brother        d. late 37s  . Colon cancer Paternal Aunt   . Breast cancer Daughter 78       negative genetic testing in 2016  . Skin cancer Daughter        non-melanoma type; +sun exposure  . Kidney failure Son 91       +EtOH abuse resulting in liver, kidney, and pancreas failure  . Diabetes Maternal Uncle        d. later age  . Heart disease Neg Hx        family  . Esophageal cancer Neg Hx   . Rectal cancer Neg Hx   . Stomach cancer Neg Hx      Prior to Admission medications   Medication Sig Start Date End Date Taking? Authorizing Provider  albuterol (PROVENTIL HFA;VENTOLIN HFA) 108 (90 Base) MCG/ACT inhaler Inhale 2 puffs into the lungs every 6 (six) hours  as needed for wheezing or shortness of breath. 02/24/18  Yes Teresa, [name], MD  anastrozole (ARIMIDEX) 1 MG tablet Take 1 tablet (1 mg total) by mouth daily. 04/18/18  Yes Teresa Merle, MD  Ascorbic Acid (VITAMIN C) 1000 MG tablet Take 1,000 mg by mouth daily.   Yes [provider]  BIOTIN 5000 PO Take 1 tablet by mouth daily.   Yes [provider]  Calcium Carb-Cholecalciferol (CALCIUM 1000 + D PO) Take 1 tablet by mouth daily.   Yes [provider]  hydrochlorothiazide (MICROZIDE) 12.5 MG capsule TAKE 1 CAPSULE BY MOUTH DAILY. Patient taking differently: Take 12.5 mg by mouth daily.  04/03/18  Yes Teresa, [name], MD  metoprolol tartrate (LOPRESSOR) 25 MG tablet Take 1 tablet (25 mg total) by mouth daily. TAKE 1 TABLET BY MOUTH DAILY 09/27/17  Yes Hochrein, Jeneen Rinks, MD  Multiple Vitamins-Minerals (CENTRUM SILVER ADULT 50+) TABS Take 1 tablet by mouth daily.   Yes [provider]  omega-3 acid ethyl esters  (LOVAZA) 1 Frey capsule Take 1 Frey by mouth daily.    Yes [provider]  sertraline (ZOLOFT) 50 MG tablet Take 1 tablet (50 mg total) by mouth daily. 02/25/18  Yes Teresa, [name], MD  vitamin B-12 (CYANOCOBALAMIN) 1000 MCG tablet Take 1,000 mcg by mouth daily.   Yes [provider]  warfarin (COUMADIN) 5 MG tablet Take 38m (3 tablets) on Tuesdays and Thursdays, take 10 mg (2 tablets) all other days of the week or as directed by coumadin clinic Patient taking differently: Take 10-15 mg by mouth See admin instructions. Take 125m(3 tablets) on Tuesdays and Thursdays, take 10 mg (2 tablets) all other days of the week 10/01/17  Yes HoMinus BreedingMD    Physical Exam: Vitals:   04/19/18 1845 04/19/18 1915 04/19/18 2015 04/19/18 2130  BP: 112/67 (!) 130/57 132/63 122/63  Pulse: (!) 59 (!) 56 60 64  Resp: _0 Temp:      TempSrc:      SpO2: 92% 96% 93% 90%  Weight:      Height:        Constitutional: NAD, calm, comfortable Eyes: PERRL, lids and conjunctivae normal ENMT: Mucous membranes are moist. Posterior pharynx clear of any exudate or lesions.Normal dentition.  Neck: normal, supple, no masses, no thyromegaly Respiratory: clear to auscultation bilaterally, no wheezing, no crackles. Normal respiratory effort. No accessory muscle use.  Cardiovascular: Regular rate and rhythm, no murmurs / rubs / gallops. No extremity edema. 2+ pedal pulses. No carotid bruits.  Abdomen: no tenderness, no masses palpated. No hepatosplenomegaly. Bowel sounds positive.  Musculoskeletal: L femur tenderness, swelling, deformity Skin: no rashes, lesions, ulcers. No induration Neurologic: CN 2-12 grossly intact. Sensation intact, DTR normal. Strength 5/5 in all 4.  Psychiatric: Normal judgment and insight. Alert and oriented x 3. Normal mood.    Labs on Admission: I have personally reviewed following labs and imaging studies  CBC: Recent Labs  Lab 04/18/18 1027 04/19/18 1832  WBC  8.9 18.9*  NEUTROABS 5.1 15.4*  HGB 14.6 14.0  HCT 45.7 43.2  MCV 92.9 92.1  PLT 169 21307 Basic Metabolic Panel: Recent Labs  Lab 04/18/18 1027 04/19/18 1832  NA 142 139  K 4.0 3.7  CL 106 106  CO2 29 25  GLUCOSE 86 180*  BUN 15 17  CREATININE 0.74 0.75  CALCIUM 10.0 9.3   GFR: Estimated Creatinine Clearance: 48.3 mL/min (by C-Frey formula based on  SCr of 0.75 mg/dL). Liver Function Tests: Recent Labs  Lab 04/18/18 1027 04/19/18 1832  AST 16 19  ALT 14 16  ALKPHOS 186* 149*  BILITOT 0.4 0.7  PROT 6.8 6.6  ALBUMIN 3.8 3.8   No results for input(s): LIPASE, AMYLASE in the last 168 hours. No results for input(s): AMMONIA in the last 168 hours. Coagulation Profile: Recent Labs  Lab 04/19/18 1832  INR 2.31   Cardiac Enzymes: No results for input(s): CKTOTAL, CKMB, CKMBINDEX, TROPONINI in the last 168 hours. BNP (last 3 results) No results for input(s): PROBNP in the last 8760 hours. HbA1C: No results for input(s): HGBA1C in the last 72 hours. CBG: No results for input(s): GLUCAP in the last 168 hours. Lipid Profile: No results for input(s): CHOL, HDL, LDLCALC, TRIG, CHOLHDL, LDLDIRECT in the last 72 hours. Thyroid Function Tests: No results for input(s): TSH, T4TOTAL, FREET4, T3FREE, THYROIDAB in the last 72 hours. Anemia Panel: No results for input(s): VITAMINB12, FOLATE, FERRITIN, TIBC, IRON, RETICCTPCT in the last 72 hours. Urine analysis:    Component Value Date/Time   BILIRUBINUR n 10/06/2015 1034   PROTEINUR 1+ 10/06/2015 1034   UROBILINOGEN 1.0 10/06/2015 1034   NITRITE n 10/06/2015 1034   LEUKOCYTESUR Negative 10/06/2015 1034    Radiological Exams on Admission: Dg Pelvis Portable  Result Date: 04/19/2018 CLINICAL DATA:  Fall with left leg pain EXAM: PORTABLE PELVIS 1-2 VIEWS COMPARISON:  03/04/2017 FINDINGS: Coarse calcification in the pelvis may reflect fibroid. Status post bilateral hip replacement with normal alignment. Pubic symphysis and  rami appear intact. IMPRESSION: Status post bilateral hip replacement. No definite acute osseous abnormality. Electronically Signed   By: Donavan Foil M.D.   On: 04/19/2018 19:28   Dg Chest Portable 1 View  Result Date: 04/19/2018 CLINICAL DATA:  Elevated WBC, shortness of breath EXAM: PORTABLE CHEST 1 VIEW COMPARISON:  06/28/2017 FINDINGS: There is no focal consolidation. There is chronic bilateral interstitial thickening. The lungs are hyperinflated likely secondary to COPD. There is no pleural effusion or pneumothorax. There is stable cardiomegaly. The osseous structures are unremarkable. IMPRESSION: No acute cardiopulmonary disease. Electronically Signed   By: Kathreen Devoid   On: 04/19/2018 21:22   Dg Femur Port 1v Left  Result Date: 04/19/2018 CLINICAL DATA:  Fall with leg pain EXAM: LEFT FEMUR PORTABLE 1 VIEW COMPARISON:  None. FINDINGS: Status post left hip replacement. Acute mildly comminuted fracture at the mid to distal shaft of the femur, just caudal to the tip of the femoral prosthesis. Close to 1 bone with of anterior and lateral displacement of dominant fracture fragment. IMPRESSION: Acute, mildly comminuted and displaced fracture involving the mid to distal shaft of the left femur, just caudal to the tip of the femoral prosthesis. Electronically Signed   By: Donavan Foil M.D.   On: 04/19/2018 19:30    EKG: Independently reviewed.  Assessment/Plan Principal Problem:   Periprosthetic fracture around internal prosthetic left hip joint (HCC) Active Problems:   Essential hypertension   Breast cancer of upper-outer quadrant of right female breast ([place])   A-fib ([place])    1. Periprosthetic fx of L femur - 1. Hip fx pathway 2. In traction 3. Surgery planned for 1500 on Monday 11/18 4. EKG pending 5. Holding coumadin 2. A.Fib - 1. Continue metoprolol 2. Holding coumadin 3. HTN - continue home BP meds 4. H/o BRCA - remission anastrozole x5 years  DVT prophylaxis: INR 2.3  currently, SCDs ordered, holding coumadin Code Status: Full Family Communication: No family  in room Disposition Plan: SNF vs CIR after admit Consults called: ortho Surgery Admission status: Admit to inpatient  Severity of Illness: The appropriate patient status for this patient is INPATIENT. Inpatient status is judged to be reasonable and necessary in order to provide the required intensity of service to ensure the patient's safety. The patient's presenting symptoms, physical exam findings, and initial radiographic and laboratory data in the context of their chronic comorbidities is felt to place them at high risk for further clinical deterioration. Furthermore, it is not anticipated that the patient will be medically stable for discharge from the hospital within 2 midnights of admission. The following factors support the patient status of inpatient.   " The patient's presenting symptoms include Fall, L hip pain. " The worrisome physical exam findings include L Hip deformity. " The initial radiographic and laboratory data are worrisome because of L femur fx distal to prosthetic. " The chronic co-morbidities include A.fib on coumadin, prior L hip replacement.   * I certify that at the point of admission it is my clinical judgment that the patient will require inpatient hospital care spanning beyond 2 midnights from the point of admission due to high intensity of service, high risk for further deterioration and high frequency of surveillance required.Etta Quill DO [facility] Pager [phone] Only works nights!  If 7AM-7PM, please contact the primary day team physician taking care of patient  [email or web address] Password [email or web address]  04/19/2018, 10:00 PM

## 2018-04-19 NOTE — ED Notes (Signed)
Pt given fentanyl.  O2 sat dropped to 84% on RA.  2L O2 applied via [place] w/ improvement in O2 sat to 92%.

## 2018-04-20 ENCOUNTER — Inpatient Hospital Stay (HOSPITAL_COMMUNITY): Payer: Medicare Other

## 2018-04-20 DIAGNOSIS — I361 Nonrheumatic tricuspid (valve) insufficiency: Secondary | ICD-10-CM

## 2018-04-20 LAB — CBC
HCT: 38.2 % (ref 36.0–46.0)
Hemoglobin: 11.9 g/dL — ABNORMAL LOW (ref 12.0–15.0)
MCH: 28.8 pg (ref 26.0–34.0)
MCHC: 31.2 g/dL (ref 30.0–36.0)
MCV: 92.5 fL (ref 80.0–100.0)
PLATELETS: 208 10*3/uL (ref 150–400)
RBC: 4.13 MIL/uL (ref 3.87–5.11)
RDW: 13.1 % (ref 11.5–15.5)
WBC: 13.8 10*3/uL — AB (ref 4.0–10.5)
nRBC: 0.1 % (ref 0.0–0.2)

## 2018-04-20 LAB — BASIC METABOLIC PANEL
Anion gap: 11 (ref 5–15)
BUN: 21 mg/dL (ref 8–23)
CALCIUM: 9.1 mg/dL (ref 8.9–10.3)
CO2: 26 mmol/L (ref 22–32)
CREATININE: 0.95 mg/dL (ref 0.44–1.00)
Chloride: 102 mmol/L (ref 98–111)
GFR calc Af Amer: >60 mL/min (ref >60)
GFR, EST NON AFRICAN AMERICAN: 53 mL/min — AB (ref >60)
Glucose, Bld: 185 mg/dL — ABNORMAL HIGH (ref 70–99)
Potassium: 3.8 mmol/L (ref 3.5–5.1)
SODIUM: 139 mmol/L (ref 135–145)

## 2018-04-20 LAB — ECHOCARDIOGRAM COMPLETE
Height: 63 in
Weight: 2576 [oz_av]

## 2018-04-20 LAB — TROPONIN I
TROPONIN I: 0.03 ng/mL — AB (ref <0.03)
Troponin I: <0.03 ng/mL (ref <0.03)
Troponin I: <0.03 ng/mL (ref <0.03)
Troponin I: <0.03 ng/mL (ref <0.03)

## 2018-04-20 LAB — MRSA PCR SCREENING: MRSA BY PCR: NEGATIVE

## 2018-04-20 LAB — ABO/RH: ABO/RH(D): O POS

## 2018-04-20 MED ORDER — SODIUM CHLORIDE 0.9% IV SOLUTION
Freq: Once | INTRAVENOUS | Status: AC
  Administered 2018-04-21: 10 mL/h via INTRAVENOUS

## 2018-04-20 MED ORDER — CEFAZOLIN SODIUM-DEXTROSE 2-4 GM/100ML-% IV SOLN
2.0000 g | Freq: Once | INTRAVENOUS | Status: AC
  Administered 2018-04-21: 2 g via INTRAVENOUS
  Filled 2018-04-20: qty 100

## 2018-04-20 MED ORDER — VITAMIN K1 10 MG/ML IJ SOLN
10.0000 mg | Freq: Once | INTRAVENOUS | Status: AC
  Administered 2018-04-21: 10 mg via INTRAVENOUS
  Filled 2018-04-20: qty 1

## 2018-04-20 MED ORDER — OMEGA-3-ACID ETHYL ESTERS 1 G PO CAPS
1.0000 g | ORAL_CAPSULE | Freq: Every day | ORAL | Status: DC
  Administered 2018-04-20 – 2018-04-24 (×4): 1 g via ORAL
  Filled 2018-04-20 (×4): qty 1

## 2018-04-20 MED ORDER — SERTRALINE HCL 50 MG PO TABS
50.0000 mg | ORAL_TABLET | Freq: Every day | ORAL | Status: DC
  Administered 2018-04-20 – 2018-04-24 (×5): 50 mg via ORAL
  Filled 2018-04-20 (×5): qty 1

## 2018-04-20 MED ORDER — VITAMIN B-12 1000 MCG PO TABS
1000.0000 ug | ORAL_TABLET | Freq: Every day | ORAL | Status: DC
  Administered 2018-04-20 – 2018-04-24 (×4): 1000 ug via ORAL
  Filled 2018-04-20 (×4): qty 1

## 2018-04-20 MED ORDER — ADULT MULTIVITAMIN W/MINERALS CH
1.0000 | ORAL_TABLET | Freq: Every day | ORAL | Status: DC
  Administered 2018-04-20 – 2018-04-24 (×4): 1 via ORAL
  Filled 2018-04-20 (×4): qty 1

## 2018-04-20 MED ORDER — PERFLUTREN LIPID MICROSPHERE
1.0000 mL | INTRAVENOUS | Status: AC | PRN
  Administered 2018-04-20: 3 mL via INTRAVENOUS
  Filled 2018-04-20: qty 10

## 2018-04-20 MED ORDER — WARFARIN - PHARMACIST DOSING INPATIENT: Freq: Every day | Status: DC

## 2018-04-20 MED ORDER — HYDROCHLOROTHIAZIDE 12.5 MG PO CAPS
12.5000 mg | ORAL_CAPSULE | Freq: Every day | ORAL | Status: DC
  Administered 2018-04-20 – 2018-04-24 (×5): 12.5 mg via ORAL
  Filled 2018-04-20 (×5): qty 1

## 2018-04-20 MED ORDER — BIOTIN 5 MG PO CAPS: ORAL_CAPSULE | Freq: Every day | ORAL | Status: DC

## 2018-04-20 MED ORDER — VITAMIN C 500 MG PO TABS
1000.0000 mg | ORAL_TABLET | Freq: Every day | ORAL | Status: DC
  Administered 2018-04-20 – 2018-04-24 (×4): 1000 mg via ORAL
  Filled 2018-04-20 (×4): qty 2

## 2018-04-20 MED ORDER — NITROGLYCERIN 0.4 MG SL SUBL
0.4000 mg | SUBLINGUAL_TABLET | SUBLINGUAL | Status: DC | PRN
  Administered 2018-04-20: 0.4 mg via SUBLINGUAL
  Filled 2018-04-20: qty 1

## 2018-04-20 MED ORDER — ANASTROZOLE 1 MG PO TABS
1.0000 mg | ORAL_TABLET | Freq: Every day | ORAL | Status: DC
  Administered 2018-04-20 – 2018-04-24 (×5): 1 mg via ORAL
  Filled 2018-04-20 (×5): qty 1

## 2018-04-20 MED ORDER — CENTRUM SILVER ADULT 50+ PO TABS: 1.0000 | ORAL_TABLET | Freq: Every day | ORAL | Status: DC

## 2018-04-20 MED ORDER — CALCIUM CARBONATE-VITAMIN D 500-200 MG-UNIT PO TABS
2.0000 | ORAL_TABLET | Freq: Every day | ORAL | Status: DC
  Administered 2018-04-20 – 2018-04-24 (×4): 2 via ORAL
  Filled 2018-04-20 (×7): qty 2

## 2018-04-20 MED ORDER — METOPROLOL TARTRATE 25 MG PO TABS
25.0000 mg | ORAL_TABLET | Freq: Every day | ORAL | Status: DC
  Administered 2018-04-20 – 2018-04-24 (×4): 25 mg via ORAL
  Filled 2018-04-20 (×5): qty 1

## 2018-04-20 NOTE — Consult Note (Signed)
ORTHOPAEDIC CONSULTATION  REQUESTING PHYSICIAN: Purohit, Konrad Dolores, MD  Chief Complaint: left hip pain  HPI: Teresa Frey is a 84 y.o. female who presents with left hip pain following a mechanical fall she sustained yesterday afternoon.  Patient tripped over [facility] unit in her bedroom landing on her left lateral hip.  She was taken to [facility] via ambulance.  xrays obtained showed a left peri-prosthetic mid to distal shaft femur fracture.  She was placed on bed rest and is currently in traction.  Today, she is in moderate pain, although somewhat better with narcotic pain medication.  Pain is the the entire LLE.  No calf pain.  Pain is worse with movement of the LLE.  No numbness/tingling.  She does report 3 previous sx to the LLE to include primary left total hip replacement, revision left hip replacement and the left hip ORIF peri-prosthetic fracture with plate and cable fixation. Doing well until this recent fall.  Of note, she is also s/p right THA by Dr. Marlou Sa in 2012.  pmhx includes A-fibb tx with coumadin as well as breast cancer.   Past Medical History:  Diagnosis Date  . ANXIETY 04/07/2007  . Breast cancer of upper-outer quadrant of right female breast ([place]) 01/11/2016  . COLONIC POLYPS, HX OF 04/02/2007   adenomatous  . DIVERTICULITIS, HX OF 04/02/2007  . Diverticulosis   . DJD (degenerative joint disease)   . Hemorrhoids   . HYPERTENSION 04/02/2007  . OSTEOARTHRITIS, SEVERE 04/07/2007  . 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 WEIGHT GAIN 11/13/2007   Past Surgical History:  Procedure Laterality Date  . APPENDECTOMY    . BREAST LUMPECTOMY WITH RADIOACTIVE SEED LOCALIZATION Right 02/08/2016   Procedure: RIGHT BREAST LUMPECTOMY WITH RADIOACTIVE SEED LOCALIZATION;  Surgeon: Fanny Skates, MD;  Location: [facility];  Service: General;  Laterality: Right;  . TONSILLECTOMY    . TOTAL HIP ARTHROPLASTY  1999, left hip    2006 right hip  . TUBAL LIGATION     Social  History   Socioeconomic History  . Marital status: Married    Spouse name: Not on file  . Number of children: 2  . Years of education: Not on file  . Highest education level: Not on file  Occupational History  . Not on file  Social Needs  . Financial resource strain: Not on file  . Food insecurity:    Worry: Not on file    Inability: Not on file  . Transportation needs:    Medical: Not on file    Non-medical: Not on file  Tobacco Use  . Smoking status: Former Smoker    Packs/day: 1.00    Years: 27.00    Pack years: 27.00    Types: Cigarettes    Last attempt to quit: 06/04/1978    Years since quitting: 39.9  . Smokeless tobacco: Never Used  Substance and Sexual Activity  . Alcohol use: Yes    Alcohol/week: 7.0 standard drinks    Types: 7 Glasses of wine per week    Comment: 1-2 glasses wine after dinner, but not necessarily each day  . Drug use: No  . Sexual activity: Not on file  Lifestyle  . Physical activity:    Days per week: Not on file    Minutes per session: Not on file  . Stress: Not on file  Relationships  . Social connections:    Talks on phone: Not on file  Gets together: Not on file    Attends religious service: Not on file    Active member of club or organization: Not on file    Attends meetings of clubs or organizations: Not on file    Relationship status: Not on file  Other Topics Concern  . Not on file  Social History Narrative  . Not on file   Family History  Problem Relation Age of Onset  . Pancreatic cancer Mother 20       d. 86  . Cancer Father 11       hodgkin's lymphoma and leukemia; d. 68  . Colon cancer Brother        dx. late 68s; smoker  . Pancreatic cancer Brother 80       d. 43; smoker  . Lung cancer Brother 40       smoker  . Heart attack Brother        d. late 47s  . Colon cancer Paternal Aunt   . Breast cancer Daughter 80       negative genetic testing in 2016  . Skin cancer Daughter        non-melanoma type; +sun  exposure  . Kidney failure Son 79       +EtOH abuse resulting in liver, kidney, and pancreas failure  . Diabetes Maternal Uncle        d. later age  . Heart disease Neg Hx        family  . Esophageal cancer Neg Hx   . Rectal cancer Neg Hx   . Stomach cancer Neg Hx    - negative except otherwise stated in the family history section Allergies  Allergen Reactions  . Tramadol     Loss of control   Prior to Admission medications   Medication Sig Start Date End Date Taking? Authorizing Provider  albuterol (PROVENTIL HFA;VENTOLIN HFA) 108 (90 Base) MCG/ACT inhaler Inhale 2 puffs into the lungs every 6 (six) hours as needed for wheezing or shortness of breath. 02/24/18  Yes [name], [name], MD  anastrozole (ARIMIDEX) 1 MG tablet Take 1 tablet (1 mg total) by mouth daily. 04/18/18  Yes Truitt Merle, MD  Ascorbic Acid (VITAMIN C) 1000 MG tablet Take 1,000 mg by mouth daily.   Yes [provider]  BIOTIN 5000 PO Take 1 tablet by mouth daily.   Yes [provider]  Calcium Carb-Cholecalciferol (CALCIUM 1000 + D PO) Take 1 tablet by mouth daily.   Yes [provider]  hydrochlorothiazide (MICROZIDE) 12.5 MG capsule TAKE 1 CAPSULE BY MOUTH DAILY. Patient taking differently: Take 12.5 mg by mouth daily.  04/03/18  Yes [name], [name], MD  metoprolol tartrate (LOPRESSOR) 25 MG tablet Take 1 tablet (25 mg total) by mouth daily. TAKE 1 TABLET BY MOUTH DAILY 09/27/17  Yes Hochrein, Jeneen Rinks, MD  Multiple Vitamins-Minerals (CENTRUM SILVER ADULT 50+) TABS Take 1 tablet by mouth daily.   Yes [provider]  omega-3 acid ethyl esters (LOVAZA) 1 g capsule Take 1 g by mouth daily.    Yes [provider]  sertraline (ZOLOFT) 50 MG tablet Take 1 tablet (50 mg total) by mouth daily. 02/25/18  Yes [name], [name], MD  vitamin B-12 (CYANOCOBALAMIN) 1000 MCG tablet Take 1,000 mcg by mouth daily.   Yes [provider]  warfarin (COUMADIN) 5 MG tablet Take 15mg  (3 tablets)  on Tuesdays and Thursdays, take 10 mg (2 tablets) all other days of the week or as directed  by coumadin clinic Patient taking differently: Take 10-15 mg by mouth See admin instructions. Take 15mg  (3 tablets) on Tuesdays and Thursdays, take 10 mg (2 tablets) all other days of the week 10/01/17  Yes [name], MD   Dg Pelvis Portable  Result Date: 04/19/2018 CLINICAL DATA:  Fall with left leg pain EXAM: PORTABLE PELVIS 1-2 VIEWS COMPARISON:  03/04/2017 FINDINGS: Coarse calcification in the pelvis may reflect fibroid. Status post bilateral hip replacement with normal alignment. Pubic symphysis and rami appear intact. IMPRESSION: Status post bilateral hip replacement. No definite acute osseous abnormality. Electronically Signed   By: Donavan Foil M.D.   On: 04/19/2018 19:28   Dg Chest Portable 1 View  Result Date: 04/19/2018 CLINICAL DATA:  Elevated WBC, shortness of breath EXAM: PORTABLE CHEST 1 VIEW COMPARISON:  06/28/2017 FINDINGS: There is no focal consolidation. There is chronic bilateral interstitial thickening. The lungs are hyperinflated likely secondary to COPD. There is no pleural effusion or pneumothorax. There is stable cardiomegaly. The osseous structures are unremarkable. IMPRESSION: No acute cardiopulmonary disease. Electronically Signed   By: Kathreen Devoid   On: 04/19/2018 21:22   Dg Femur Port 1v Left  Result Date: 04/19/2018 CLINICAL DATA:  Fall with leg pain EXAM: LEFT FEMUR PORTABLE 1 VIEW COMPARISON:  None. FINDINGS: Status post left hip replacement. Acute mildly comminuted fracture at the mid to distal shaft of the femur, just caudal to the tip of the femoral prosthesis. Close to 1 bone with of anterior and lateral displacement of dominant fracture fragment. IMPRESSION: Acute, mildly comminuted and displaced fracture involving the mid to distal shaft of the left femur, just caudal to the tip of the femoral prosthesis. Electronically Signed   By: Donavan Foil M.D.   On:  04/19/2018 19:30   - pertinent xrays, CT, MRI studies were reviewed and independently interpreted  Positive ROS: All other systems have been reviewed and were otherwise negative with the exception of those mentioned in the HPI and as above.  Physical Exam: General: Alert, no acute distress Cardiovascular: No pedal edema Respiratory: No cyanosis, no use of accessory musculature GI: No organomegaly, abdomen is soft and non-tender Skin: No lesions in the area of chief complaint Neurologic: Sensation intact distally Psychiatric: Patient is competent for consent with normal mood and affect Lymphatic: No axillary or cervical lymphadenopathy  MUSCULOSKELETAL:  LLE- shortened and externally rotated.  Pain with any movement of the left leg.  No ecchymosis or skin tenting. Toes are wwp.  Calf soft and NT.  Assessment: Left periprosthetic hip fracture  Plan: NWB LLE Continue traction  Will plan for ORIF left periprosthetic hip fracture tomorrow afternoon.  NPO after midnight tonight Please hold coumadin today and tomorrow  Thank you for the consult and the opportunity to see Ms. Shavers  N. Eduard Roux, MD [place] 10:07 AM

## 2018-04-20 NOTE — Progress Notes (Signed)
Pt admitted to rm 5N12 from the ED s/p mechanical fall at home resulting in L femur fx. Pt arrived in 10 pounds buck's traction. Pt alert and oriented. Pt oriented to room and call light. Bed locked and in lowest positioning for traction.

## 2018-04-20 NOTE — Progress Notes (Signed)
  Echocardiogram 2D Echocardiogram has been performed.  Teresa Frey 04/20/2018, 3:52 PM

## 2018-04-20 NOTE — Progress Notes (Signed)
ANTICOAGULATION CONSULT NOTE - Initial Consult  Pharmacy Consult for coumadin Indication: atrial fibrillation  Allergies  Allergen Reactions  . Tramadol     Loss of control    Patient Measurements: Height: 5\' 3"  (160 cm) Weight: 161 lb (73 kg) IBW/kg (Calculated) : 52.4  Vital Signs: Temp: 98.1 F (36.7 C) (11/17 0848) Temp Source: Oral (11/17 0848) BP: 117/48 (11/17 0848) Pulse Rate: 69 (11/17 0848)  Labs: Recent Labs    04/18/18 1027 04/19/18 1832 04/20/18 0348  HGB 14.6 14.0 11.9*  HCT 45.7 43.2 38.2  PLT 169 214 208  LABPROT  --  25.1*  --   INR  --  2.31  --   CREATININE 0.74 0.75 0.95  TROPONINI  --   --  0.03*    Estimated Creatinine Clearance: 40.7 mL/min (by C-G formula based on SCr of 0.95 mg/dL).   Medical History: Past Medical History:  Diagnosis Date  . ANXIETY 04/07/2007  . Breast cancer of upper-outer quadrant of right female breast ([place]) 01/11/2016  . COLONIC POLYPS, HX OF 04/02/2007   adenomatous  . DIVERTICULITIS, HX OF 04/02/2007  . Diverticulosis   . DJD (degenerative joint disease)   . Hemorrhoids   . HYPERTENSION 04/02/2007  . OSTEOARTHRITIS, SEVERE 04/07/2007  . 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 WEIGHT GAIN 11/13/2007    Assessment: Teresa Frey is an 84yo female presenting s/p fall with periprosthetic hip fracture now pending OR on 04/21/2018. Pharmacy consulted to manage coumadin, but holding for today and tomorrow in light of pending surgery.   Goal of Therapy:  INR 2-3 Monitor platelets by anticoagulation protocol: Yes   Plan:  Hold warfarin tonight and tomorrow Monitor daily INR, CBC, s/sx of bleeding  Thank you for involving pharmacy in this patient's care.  Janae Bridgeman, PharmD PGY1 Pharmacy Resident Phone: [phone] 04/20/2018 10:30 AM

## 2018-04-20 NOTE — Progress Notes (Signed)
PROGRESS NOTE    Teresa Frey  NUU:[id number] DOB: Mar 04, 1930 DOA: 04/19/2018 PCP: [name], [name], MD    Brief Narrative:   84 year old with past medical history relevant for breast cancer in remission on anastrozole, depression/anxiety, chronic atrial fibrillation on warfarin, hypertension, hyperlipidemia admitted with fall and found to have periprosthetic left femur fracture.  Her course has been complicated by an episode of chest pain.   Assessment & Plan:   Principal Problem:   Periprosthetic fracture around internal prosthetic left hip joint (HCC) Active Problems:   Essential hypertension   Breast cancer of upper-outer quadrant of right female breast ([place])   A-fib ([place])   #) chest pain: Last night patient began to complain of substernal chest pain.  Pain was while she was lying in bed.  It did not radiate anywhere.  It was dull and primarily in the center of her chest. -Troponin elevated to 0.03, pending cycling troponins -EKG pending -Echo ordered  #) Left periprosthetic hip fracture: Pending OR on 04/21/2018 with [facility] if patient is cleared medically -Holding a VTE prophylaxis his INR is likely elevated  #) Chronic atrial fibrillation: -Hold warfarin -Pharmacy consult - Continue metoprolol tartrate 25 mg daily  #) Hypertension/hyperlipidemia: -Continue HCTZ 12.5 mg daily -Continue metoprolol tartrate 25 mg daily -Continue omega-3 fatty acids  #) Pain/psych: -Continue sertraline 50 mg daily  #) Sees breast cancer in remission: -Continue anastrozole  Fluids: Tolerating p.o. Elect lites: Monitor and supplement Nutrition: Heart healthy diet, n.p.o. at midnight  Prophylaxis: Elevated INR  Disposition: Pending PT evaluation  Full code  Consultants:   Orthopedic surgery, [place]  Procedures:   Echo 04/20/2018: Pending  Antimicrobials:   None   Subjective: This morning patient reports that her chest pain is completely resolved.   She denies any nausea, vomiting, diarrhea, cough, congestion, rhinorrhea.  She denies any prior exertional chest pain.  She reports she only has pain in her left leg.  Objective: Vitals:   04/20/18 0310 04/20/18 0317 04/20/18 0322 04/20/18 0848  BP: (!) 94/51 103/63 (!) 110/50 (!) 117/48  Pulse: 72 85 72 69  Resp:    17  Temp:    98.1 F (36.7 C)  TempSrc:    Oral  SpO2:    99%  Weight:      Height:       No intake or output data in the 24 hours ending 04/20/18 [place]   04/19/18 1829  Weight: 73 kg    Examination:  General exam: Appears calm and comfortable  Respiratory system: Clear to auscultation. Respiratory effort normal. Cardiovascular system: Irregularly irregular, no murmurs Gastrointestinal system: Abdomen is nondistended, soft and nontender. No organomegaly or masses felt. Normal bowel sounds heard. Central nervous system: Alert and oriented grossly intact moving all extremities except left lower extremity due to pain Extremities: Trace lower extremity edema, left lower extremity with intact distal pulses. Skin: No rashes over visible skin Psychiatry: Judgement and insight appear normal. Mood & affect appropriate.     Data Reviewed: I have personally reviewed following labs and imaging studies  CBC: Recent Labs  Lab 04/18/18 1027 04/19/18 1832 04/20/18 0348  WBC 8.9 18.9* 13.8*  NEUTROABS 5.1 15.4*  --   HGB 14.6 14.0 11.9*  HCT 45.7 43.2 38.2  MCV 92.9 92.1 92.5  PLT 169 214 347   Basic Metabolic Panel: Recent Labs  Lab 04/18/18 1027 04/19/18 1832 04/20/18 0348  NA 142 139 139  K 4.0 3.7 3.8  CL  106 106 102  CO2 29 25 26   GLUCOSE 86 180* 185*  BUN 15 17 21   CREATININE 0.74 0.75 0.95  CALCIUM 10.0 9.3 9.1   GFR: Estimated Creatinine Clearance: 40.7 mL/min (by C-G formula based on SCr of 0.95 mg/dL). Liver Function Tests: Recent Labs  Lab 04/18/18 1027 04/19/18 1832  AST 16 19  ALT 14 16  ALKPHOS 186* 149*  BILITOT 0.4 0.7   PROT 6.8 6.6  ALBUMIN 3.8 3.8   No results for input(s): LIPASE, AMYLASE in the last 168 hours. No results for input(s): AMMONIA in the last 168 hours. Coagulation Profile: Recent Labs  Lab 04/19/18 1832  INR 2.31   Cardiac Enzymes: Recent Labs  Lab 04/20/18 0348  TROPONINI 0.03*   BNP (last 3 results) No results for input(s): PROBNP in the last 8760 hours. HbA1C: No results for input(s): HGBA1C in the last 72 hours. CBG: No results for input(s): GLUCAP in the last 168 hours. Lipid Profile: No results for input(s): CHOL, HDL, LDLCALC, TRIG, CHOLHDL, LDLDIRECT in the last 72 hours. Thyroid Function Tests: No results for input(s): TSH, T4TOTAL, FREET4, T3FREE, THYROIDAB in the last 72 hours. Anemia Panel: No results for input(s): VITAMINB12, FOLATE, FERRITIN, TIBC, IRON, RETICCTPCT in the last 72 hours. Sepsis Labs: No results for input(s): PROCALCITON, LATICACIDVEN in the last 168 hours.  Recent Results (from the past 240 hour(s))  MRSA PCR Screening     Status: None   Collection Time: 04/19/18 11:57 PM  Result Value Ref Range Status   MRSA by PCR NEGATIVE NEGATIVE Final    Comment:        The GeneXpert MRSA Assay (FDA approved for NASAL specimens only), is one component of a comprehensive MRSA colonization surveillance program. It is not intended to diagnose MRSA infection nor to guide or monitor treatment for MRSA infections. Performed at [facility], [place] [address] [place], [place] [address]          Radiology Studies: Dg Pelvis Portable  Result Date: 04/19/2018 CLINICAL DATA:  Fall with left leg pain EXAM: PORTABLE PELVIS 1-2 VIEWS COMPARISON:  03/04/2017 FINDINGS: Coarse calcification in the pelvis may reflect fibroid. Status post bilateral hip replacement with normal alignment. Pubic symphysis and rami appear intact. IMPRESSION: Status post bilateral hip replacement. No definite acute osseous abnormality. Electronically Signed   By: Donavan Foil M.D.   On: 04/19/2018 19:28   Dg Chest Portable 1 View  Result Date: 04/19/2018 CLINICAL DATA:  Elevated WBC, shortness of breath EXAM: PORTABLE CHEST 1 VIEW COMPARISON:  06/28/2017 FINDINGS: There is no focal consolidation. There is chronic bilateral interstitial thickening. The lungs are hyperinflated likely secondary to COPD. There is no pleural effusion or pneumothorax. There is stable cardiomegaly. The osseous structures are unremarkable. IMPRESSION: No acute cardiopulmonary disease. Electronically Signed   By: Kathreen Devoid   On: 04/19/2018 21:22   Dg Femur Port 1v Left  Result Date: 04/19/2018 CLINICAL DATA:  Fall with leg pain EXAM: LEFT FEMUR PORTABLE 1 VIEW COMPARISON:  None. FINDINGS: Status post left hip replacement. Acute mildly comminuted fracture at the mid to distal shaft of the femur, just caudal to the tip of the femoral prosthesis. Close to 1 bone with of anterior and lateral displacement of dominant fracture fragment. IMPRESSION: Acute, mildly comminuted and displaced fracture involving the mid to distal shaft of the left femur, just caudal to the tip of the femoral prosthesis. Electronically Signed   By: Madie Reno.D.  On: 04/19/2018 19:30        Scheduled Meds: Continuous Infusions:   LOS: 1 day    Time spent: [place], MD [facility]  If 7PM-7AM, please contact night-coverage [email or web address] Password [email or web address] 04/20/2018, 10:05 AM

## 2018-04-20 NOTE — Plan of Care (Signed)

## 2018-04-20 NOTE — Progress Notes (Signed)
Pt complaining of chest pain 8/10. Hospitalist paged. EKG completed. New order placed.

## 2018-04-21 ENCOUNTER — Encounter (HOSPITAL_COMMUNITY)
Admission: EM | Disposition: A | Discharge status: Skilled Nursing Facility | Payer: Self-pay | Source: Home / Self Care | Attending: Internal Medicine

## 2018-04-21 ENCOUNTER — Encounter (HOSPITAL_COMMUNITY): Payer: Self-pay | Admitting: Anesthesiology

## 2018-04-21 ENCOUNTER — Inpatient Hospital Stay (HOSPITAL_COMMUNITY): Payer: Medicare Other

## 2018-04-21 ENCOUNTER — Inpatient Hospital Stay (HOSPITAL_COMMUNITY): Payer: Medicare Other | Admitting: Anesthesiology

## 2018-04-21 DIAGNOSIS — S7292XA Unspecified fracture of left femur, initial encounter for closed fracture: Secondary | ICD-10-CM

## 2018-04-21 HISTORY — PX: ORIF FEMUR FRACTURE: SHX2119

## 2018-04-21 LAB — BASIC METABOLIC PANEL
Anion gap: 5 (ref 5–15)
Calcium: 9.2 mg/dL (ref 8.9–10.3)
GFR calc Af Amer: >60 mL/min (ref >60)
GFR calc non Af Amer: 54 mL/min — ABNORMAL LOW (ref >60)
Glucose, Bld: 155 mg/dL — ABNORMAL HIGH (ref 70–99)
Potassium: 4.3 mmol/L (ref 3.5–5.1)

## 2018-04-21 LAB — CBC
HCT: 34.4 % — ABNORMAL LOW (ref 36.0–46.0)
HEMATOCRIT: 31.8 % — AB (ref 36.0–46.0)
Hemoglobin: 10.7 g/dL — ABNORMAL LOW (ref 12.0–15.0)
Hemoglobin: 10.3 g/dL — ABNORMAL LOW (ref 12.0–15.0)
MCH: 29.2 pg (ref 26.0–34.0)
MCH: 30.3 pg (ref 26.0–34.0)
MCHC: 31.1 g/dL (ref 30.0–36.0)
MCHC: 32.4 g/dL (ref 30.0–36.0)
MCV: 94 fL (ref 80.0–100.0)
MCV: 93.5 fL (ref 80.0–100.0)
PLATELETS: 190 10*3/uL (ref 150–400)
Platelets: 188 10*3/uL (ref 150–400)
RBC: 3.66 MIL/uL — ABNORMAL LOW (ref 3.87–5.11)
RBC: 3.4 MIL/uL — AB (ref 3.87–5.11)
RDW: 13.2 % (ref 11.5–15.5)
RDW: 13.8 % (ref 11.5–15.5)
WBC: 12.4 K/uL — ABNORMAL HIGH (ref 4.0–10.5)
WBC: 23.2 10*3/uL — AB (ref 4.0–10.5)
nRBC: 0 % (ref 0.0–0.2)
nRBC: 0 % (ref 0.0–0.2)

## 2018-04-21 LAB — PREPARE FRESH FROZEN PLASMA: UNIT DIVISION: 0

## 2018-04-21 LAB — BPAM FFP
Blood Product Expiration Date: 201911202359
ISSUE DATE / TIME: 201911172214
Unit Type and Rh: 5100

## 2018-04-21 LAB — PROTIME-INR
INR: 2.07
Prothrombin Time: 23.1 s — ABNORMAL HIGH (ref 11.4–15.2)

## 2018-04-21 LAB — POCT I-STAT 4, (NA,K, GLUC, HGB,HCT)
GLUCOSE: 125 mg/dL — AB (ref 70–99)
HEMATOCRIT: 29 % — AB (ref 36.0–46.0)
Hemoglobin: 9.9 g/dL — ABNORMAL LOW (ref 12.0–15.0)
POTASSIUM: 4 mmol/L (ref 3.5–5.1)
Sodium: 140 mmol/L (ref 135–145)

## 2018-04-21 LAB — PREPARE RBC (CROSSMATCH)

## 2018-04-21 LAB — BASIC METABOLIC PANEL WITH GFR
BUN: 18 mg/dL (ref 8–23)
CO2: 34 mmol/L — ABNORMAL HIGH (ref 22–32)
Chloride: 100 mmol/L (ref 98–111)
Creatinine, Ser: 0.93 mg/dL (ref 0.44–1.00)
Sodium: 139 mmol/L (ref 135–145)

## 2018-04-21 SURGERY — OPEN REDUCTION INTERNAL FIXATION (ORIF) DISTAL FEMUR FRACTURE
Anesthesia: General | Site: Leg Upper | Laterality: Left

## 2018-04-21 MED ORDER — CEFAZOLIN SODIUM-DEXTROSE 2-3 GM-%(50ML) IV SOLR
INTRAVENOUS | Status: DC | PRN
  Administered 2018-04-21: 2 g via INTRAVENOUS

## 2018-04-21 MED ORDER — SUGAMMADEX SODIUM 200 MG/2ML IV SOLN
INTRAVENOUS | Status: DC | PRN
  Administered 2018-04-21: 200 mg via INTRAVENOUS

## 2018-04-21 MED ORDER — METHOCARBAMOL 500 MG PO TABS: 500.0000 mg | ORAL_TABLET | Freq: Four times a day (QID) | ORAL | Status: DC | PRN

## 2018-04-21 MED ORDER — ZINC SULFATE 220 (50 ZN) MG PO CAPS: 220.0000 mg | ORAL_CAPSULE | Freq: Every day | ORAL | 0 refills | Status: DC

## 2018-04-21 MED ORDER — SODIUM CHLORIDE 0.9 % IR SOLN
Status: DC | PRN
  Administered 2018-04-21: 1000 mL

## 2018-04-21 MED ORDER — ENOXAPARIN SODIUM 40 MG/0.4ML ~~LOC~~ SOLN
40.0000 mg | SUBCUTANEOUS | Status: DC
  Administered 2018-04-22 – 2018-04-24 (×3): 40 mg via SUBCUTANEOUS
  Filled 2018-04-21 (×3): qty 0.4

## 2018-04-21 MED ORDER — LACTATED RINGERS IV SOLN: INTRAVENOUS | Status: DC | PRN

## 2018-04-21 MED ORDER — HYDROCODONE-ACETAMINOPHEN 7.5-325 MG PO TABS
1.0000 | ORAL_TABLET | ORAL | Status: DC | PRN
  Administered 2018-04-21 – 2018-04-22 (×2): 1 via ORAL
  Filled 2018-04-21 (×2): qty 1

## 2018-04-21 MED ORDER — SODIUM CHLORIDE 0.9% IV SOLUTION: Freq: Once | INTRAVENOUS | Status: DC

## 2018-04-21 MED ORDER — TRANEXAMIC ACID-NACL 1000-0.7 MG/100ML-% IV SOLN
INTRAVENOUS | Status: AC | PRN
  Administered 2018-04-21: 2000 mg via INTRAVENOUS

## 2018-04-21 MED ORDER — ROCURONIUM BROMIDE 50 MG/5ML IV SOSY
PREFILLED_SYRINGE | INTRAVENOUS | Status: AC
  Filled 2018-04-21: qty 5

## 2018-04-21 MED ORDER — VITAMIN K1 10 MG/ML IJ SOLN
10.0000 mg | INTRAVENOUS | Status: AC
  Administered 2018-04-21: 10 mg via INTRAVENOUS
  Filled 2018-04-21: qty 1

## 2018-04-21 MED ORDER — MENTHOL 3 MG MT LOZG: 1.0000 | LOZENGE | OROMUCOSAL | Status: DC | PRN

## 2018-04-21 MED ORDER — PROPOFOL 10 MG/ML IV BOLUS
INTRAVENOUS | Status: AC
  Filled 2018-04-21: qty 20

## 2018-04-21 MED ORDER — FENTANYL CITRATE (PF) 100 MCG/2ML IJ SOLN: 25.0000 ug | INTRAMUSCULAR | Status: DC | PRN

## 2018-04-21 MED ORDER — ONDANSETRON HCL 4 MG/2ML IJ SOLN
INTRAMUSCULAR | Status: DC | PRN
  Administered 2018-04-21: 4 mg via INTRAVENOUS

## 2018-04-21 MED ORDER — SODIUM CHLORIDE 0.9 % IV SOLN
INTRAVENOUS | Status: DC | PRN
  Administered 2018-04-21: 40 ug/min via INTRAVENOUS

## 2018-04-21 MED ORDER — OXYCODONE-ACETAMINOPHEN 5-325 MG PO TABS: 1.0000 | ORAL_TABLET | ORAL | 0 refills | Status: DC | PRN

## 2018-04-21 MED ORDER — LACTATED RINGERS IV SOLN
INTRAVENOUS | Status: DC | PRN
  Administered 2018-04-21: 15:00:00 via INTRAVENOUS

## 2018-04-21 MED ORDER — ACETAMINOPHEN 500 MG PO TABS
500.0000 mg | ORAL_TABLET | Freq: Four times a day (QID) | ORAL | Status: AC
  Administered 2018-04-21 – 2018-04-22 (×3): 500 mg via ORAL
  Filled 2018-04-21 (×3): qty 1

## 2018-04-21 MED ORDER — LACTATED RINGERS IV SOLN: INTRAVENOUS | Status: DC

## 2018-04-21 MED ORDER — EPHEDRINE SULFATE-NACL 50-0.9 MG/10ML-% IV SOSY
PREFILLED_SYRINGE | INTRAVENOUS | Status: DC | PRN
  Administered 2018-04-21 (×2): 5 mg via INTRAVENOUS

## 2018-04-21 MED ORDER — LACTATED RINGERS IV SOLN
INTRAVENOUS | Status: DC
  Administered 2018-04-21: 13:00:00 via INTRAVENOUS

## 2018-04-21 MED ORDER — METHOCARBAMOL 1000 MG/10ML IJ SOLN
500.0000 mg | Freq: Four times a day (QID) | INTRAVENOUS | Status: DC | PRN
  Filled 2018-04-21: qty 5

## 2018-04-21 MED ORDER — VANCOMYCIN HCL 1000 MG IV SOLR
INTRAVENOUS | Status: AC
  Filled 2018-04-21: qty 1000

## 2018-04-21 MED ORDER — SORBITOL 70 % SOLN: 30.0000 mL | Freq: Every day | Status: DC | PRN

## 2018-04-21 MED ORDER — SUGAMMADEX SODIUM 200 MG/2ML IV SOLN
INTRAVENOUS | Status: AC
  Filled 2018-04-21: qty 2

## 2018-04-21 MED ORDER — CEFAZOLIN SODIUM-DEXTROSE 2-4 GM/100ML-% IV SOLN
2.0000 g | Freq: Four times a day (QID) | INTRAVENOUS | Status: AC
  Administered 2018-04-21 – 2018-04-22 (×3): 2 g via INTRAVENOUS
  Filled 2018-04-21 (×3): qty 100

## 2018-04-21 MED ORDER — PROMETHAZINE HCL 25 MG/ML IJ SOLN: 6.2500 mg | INTRAMUSCULAR | Status: DC | PRN

## 2018-04-21 MED ORDER — TRANEXAMIC ACID 1000 MG/10ML IV SOLN
2000.0000 mg | Freq: Once | INTRAVENOUS | Status: DC
  Filled 2018-04-21: qty 20

## 2018-04-21 MED ORDER — 0.9 % SODIUM CHLORIDE (POUR BTL) OPTIME
TOPICAL | Status: DC | PRN
  Administered 2018-04-21: 1000 mL

## 2018-04-21 MED ORDER — ONDANSETRON HCL 4 MG/2ML IJ SOLN: 4.0000 mg | Freq: Four times a day (QID) | INTRAMUSCULAR | Status: DC | PRN

## 2018-04-21 MED ORDER — HYDROCODONE-ACETAMINOPHEN 5-325 MG PO TABS
1.0000 | ORAL_TABLET | ORAL | Status: DC | PRN
  Administered 2018-04-22 – 2018-04-23 (×4): 1 via ORAL
  Administered 2018-04-23: 2 via ORAL
  Administered 2018-04-23 – 2018-04-24 (×2): 1 via ORAL
  Filled 2018-04-21: qty 2
  Filled 2018-04-21 (×6): qty 1

## 2018-04-21 MED ORDER — MAGNESIUM CITRATE PO SOLN
1.0000 | Freq: Once | ORAL | Status: AC | PRN
  Administered 2018-04-24: 1 via ORAL
  Filled 2018-04-21: qty 296

## 2018-04-21 MED ORDER — VANCOMYCIN HCL 1000 MG IV SOLR
INTRAVENOUS | Status: DC | PRN
  Administered 2018-04-21: 1000 mg via TOPICAL

## 2018-04-21 MED ORDER — SODIUM CHLORIDE 0.9 % IV SOLN
INTRAVENOUS | Status: DC
  Administered 2018-04-21: 21:00:00 via INTRAVENOUS

## 2018-04-21 MED ORDER — PHENOL 1.4 % MT LIQD: 1.0000 | OROMUCOSAL | Status: DC | PRN

## 2018-04-21 MED ORDER — CALCIUM CARBONATE-VITAMIN D 500-200 MG-UNIT PO TABS: 1.0000 | ORAL_TABLET | Freq: Three times a day (TID) | ORAL | 12 refills | Status: DC

## 2018-04-21 MED ORDER — MORPHINE SULFATE (PF) 2 MG/ML IV SOLN: 1.0000 mg | INTRAVENOUS | Status: DC | PRN

## 2018-04-21 MED ORDER — SODIUM CHLORIDE 0.9 % IV SOLN: INTRAVENOUS | Status: DC | PRN

## 2018-04-21 MED ORDER — PHENYLEPHRINE 40 MCG/ML (10ML) SYRINGE FOR IV PUSH (FOR BLOOD PRESSURE SUPPORT)
PREFILLED_SYRINGE | INTRAVENOUS | Status: DC | PRN
  Administered 2018-04-21: 80 ug via INTRAVENOUS

## 2018-04-21 MED ORDER — ONDANSETRON HCL 4 MG PO TABS: 4.0000 mg | ORAL_TABLET | Freq: Four times a day (QID) | ORAL | Status: DC | PRN

## 2018-04-21 MED ORDER — FENTANYL CITRATE (PF) 250 MCG/5ML IJ SOLN
INTRAMUSCULAR | Status: AC
  Filled 2018-04-21: qty 5

## 2018-04-21 MED ORDER — DOCUSATE SODIUM 100 MG PO CAPS
100.0000 mg | ORAL_CAPSULE | Freq: Two times a day (BID) | ORAL | Status: DC
  Administered 2018-04-21 – 2018-04-24 (×6): 100 mg via ORAL
  Filled 2018-04-21 (×6): qty 1

## 2018-04-21 MED ORDER — LIDOCAINE 2% (20 MG/ML) 5 ML SYRINGE
INTRAMUSCULAR | Status: AC
  Filled 2018-04-21: qty 5

## 2018-04-21 MED ORDER — FENTANYL CITRATE (PF) 100 MCG/2ML IJ SOLN
INTRAMUSCULAR | Status: DC | PRN
  Administered 2018-04-21 (×2): 50 ug via INTRAVENOUS
  Administered 2018-04-21: 25 ug via INTRAVENOUS
  Administered 2018-04-21: 75 ug via INTRAVENOUS
  Administered 2018-04-21 (×2): 50 ug via INTRAVENOUS

## 2018-04-21 MED ORDER — PROPOFOL 10 MG/ML IV BOLUS
INTRAVENOUS | Status: DC | PRN
  Administered 2018-04-21: 60 mg via INTRAVENOUS
  Administered 2018-04-21: 100 mg via INTRAVENOUS
  Administered 2018-04-21: 40 mg via INTRAVENOUS

## 2018-04-21 MED ORDER — ACETAMINOPHEN 325 MG PO TABS: 325.0000 mg | ORAL_TABLET | Freq: Four times a day (QID) | ORAL | Status: DC | PRN

## 2018-04-21 MED ORDER — LIDOCAINE 2% (20 MG/ML) 5 ML SYRINGE
INTRAMUSCULAR | Status: DC | PRN
  Administered 2018-04-21: 60 mg via INTRAVENOUS

## 2018-04-21 MED ORDER — ALUM & MAG HYDROXIDE-SIMETH 200-200-20 MG/5ML PO SUSP: 30.0000 mL | ORAL | Status: DC | PRN

## 2018-04-21 MED ORDER — POLYETHYLENE GLYCOL 3350 17 G PO PACK
17.0000 g | PACK | Freq: Every day | ORAL | Status: DC | PRN
  Administered 2018-04-23: 17 g via ORAL
  Filled 2018-04-21 (×2): qty 1

## 2018-04-21 MED ORDER — CEFAZOLIN SODIUM-DEXTROSE 2-4 GM/100ML-% IV SOLN
INTRAVENOUS | Status: AC
  Filled 2018-04-21: qty 100

## 2018-04-21 MED ORDER — SODIUM CHLORIDE 0.9 % IV SOLN: 10.0000 mL/h | Freq: Once | INTRAVENOUS | Status: DC

## 2018-04-21 MED ORDER — VITAMIN K1 10 MG/ML IJ SOLN
10.0000 mg | INTRAVENOUS | Status: AC
  Filled 2018-04-21: qty 1

## 2018-04-21 MED ORDER — SODIUM CHLORIDE 0.9 % IV SOLN
INTRAVENOUS | Status: DC | PRN
  Administered 2018-04-21 (×2): via INTRAVENOUS

## 2018-04-21 MED ORDER — ROCURONIUM BROMIDE 10 MG/ML (PF) SYRINGE
PREFILLED_SYRINGE | INTRAVENOUS | Status: DC | PRN
  Administered 2018-04-21: 50 mg via INTRAVENOUS
  Administered 2018-04-21: 20 mg via INTRAVENOUS
  Administered 2018-04-21 (×2): 10 mg via INTRAVENOUS

## 2018-04-21 SURGICAL SUPPLY — 65 items
BAG DECANTER FOR FLEXI CONT (MISCELLANEOUS) ×2 IMPLANT
BIT DRILL 4.3 (BIT) IMPLANT
BIT DRILL QC 3.3X195 (BIT) ×1 IMPLANT
BLADE CLIPPER SURG (BLADE) IMPLANT
BNDG CMPR MED 15X6 ELC VLCR LF (GAUZE/BANDAGES/DRESSINGS) ×1
BNDG COHESIVE 4X5 TAN STRL (GAUZE/BANDAGES/DRESSINGS) ×1 IMPLANT
BNDG ELASTIC 6X15 VLCR STRL LF (GAUZE/BANDAGES/DRESSINGS) ×1 IMPLANT
CABLE CERLAGE W/CRIMP 1.8 (Cable) ×4 IMPLANT
CAP LOCK NCB (Cap) ×9 IMPLANT
COVER MAYO STAND STRL (DRAPES) ×1 IMPLANT
COVER WAND RF STERILE (DRAPES) ×2 IMPLANT
DRAPE C-ARM 42X72 X-RAY (DRAPES) ×2 IMPLANT
DRAPE C-ARMOR (DRAPES) ×2 IMPLANT
DRAPE IMP U-DRAPE 54X76 (DRAPES) ×6 IMPLANT
DRAPE INCISE IOBAN 66X45 STRL (DRAPES) ×2 IMPLANT
DRAPE ORTHO SPLIT 77X108 STRL (DRAPES) ×4
DRAPE SURG ORHT 6 SPLT 77X108 (DRAPES) ×2 IMPLANT
DRAPE U-SHAPE 47X51 STRL (DRAPES) ×2 IMPLANT
DRESSING ALLEVYN LIFE SACRUM (GAUZE/BANDAGES/DRESSINGS) ×1 IMPLANT
DRILL BIT 4.3 (BIT) ×2
DRSG AQUACEL AG ADV 3.5X10 (GAUZE/BANDAGES/DRESSINGS) ×1 IMPLANT
DRSG AQUACEL AG ADV 3.5X14 (GAUZE/BANDAGES/DRESSINGS) ×1 IMPLANT
ELECT REM PT RETURN 9FT ADLT (ELECTROSURGICAL) ×2
ELECTRODE REM PT RTRN 9FT ADLT (ELECTROSURGICAL) ×1 IMPLANT
EVACUATOR 1/8 PVC DRAIN (DRAIN) ×1 IMPLANT
GAUZE SPONGE 4X4 12PLY STRL (GAUZE/BANDAGES/DRESSINGS) ×1 IMPLANT
GAUZE XEROFORM 5X9 LF (GAUZE/BANDAGES/DRESSINGS) ×1 IMPLANT
GLOVE BIOGEL PI IND STRL 7.0 (GLOVE) ×1 IMPLANT
GLOVE BIOGEL PI INDICATOR 7.0 (GLOVE) ×1
GLOVE ECLIPSE 7.0 STRL STRAW (GLOVE) ×2 IMPLANT
GLOVE SKINSENSE NS SZ7.5 (GLOVE) ×2
GLOVE SKINSENSE STRL SZ7.5 (GLOVE) ×2 IMPLANT
GOWN STRL REIN XL XLG (GOWN DISPOSABLE) ×6 IMPLANT
K-WIRE 2.0 (WIRE) ×4
K-WIRE FXSTD 280X2XNS SS (WIRE) ×2
KIT BASIN OR (CUSTOM PROCEDURE TRAY) ×2 IMPLANT
KIT TURNOVER KIT B (KITS) ×2 IMPLANT
KWIRE FXSTD 280X2XNS SS (WIRE) IMPLANT
LOCKPLATE CABLE BUTTON NCP HIP (Orthopedic Implant) ×2 IMPLANT
MANIFOLD NEPTUNE II (INSTRUMENTS) ×2 IMPLANT
NS IRRIG 1000ML POUR BTL (IV SOLUTION) ×2 IMPLANT
PACK GENERAL/GYN (CUSTOM PROCEDURE TRAY) ×2 IMPLANT
PACK UNIVERSAL I (CUSTOM PROCEDURE TRAY) ×2 IMPLANT
PAD ARMBOARD 7.5X6 YLW CONV (MISCELLANEOUS) ×4 IMPLANT
PLATE NCB 15H HIP (Plate) ×1 IMPLANT
SCREW 5.0 60MM (Screw) ×1 IMPLANT
SCREW CORTICAL NCB 5.0X40 (Screw) ×1 IMPLANT
SCREW CORTICAL NCB 5.0X65 (Screw) ×2 IMPLANT
SCREW NCB 4.0 32MM (Screw) ×1 IMPLANT
SCREW NCB 4.0MX48M (Screw) ×1 IMPLANT
SCREW UNI CORTICAL 5.0X14MM (Screw) ×4 IMPLANT
SCREW UNICORTICAL 5.0X14 (Screw) IMPLANT
SEALER BIPOLAR AQUA 6.0 (INSTRUMENTS) ×1 IMPLANT
SLEEVE SURGEON STRL (DRAPES) ×1 IMPLANT
SPONGE LAP 18X18 RF (DISPOSABLE) ×2 IMPLANT
STAPLER VISISTAT 35W (STAPLE) ×2 IMPLANT
STOCKINETTE IMPERVIOUS LG (DRAPES) ×1 IMPLANT
SUT ETHILON 2 0 FS 18 (SUTURE) IMPLANT
SUT VIC AB 0 CT1 27 (SUTURE) ×10
SUT VIC AB 0 CT1 27XBRD ANBCTR (SUTURE) IMPLANT
SUT VIC AB 1 CTX 27 (SUTURE) ×8 IMPLANT
SUT VIC AB 2-0 CT1 36 (SUTURE) ×9 IMPLANT
TOWEL OR 17X24 6PK STRL BLUE (TOWEL DISPOSABLE) ×2 IMPLANT
TOWEL OR 17X26 10 PK STRL BLUE (TOWEL DISPOSABLE) ×4 IMPLANT
WATER STERILE IRR 1000ML POUR (IV SOLUTION) ×2 IMPLANT

## 2018-04-21 NOTE — Op Note (Addendum)
Date of Surgery: 04/21/2018  INDICATIONS: Ms. Vannatter is a 84 y.o.-year-old female with a left periprosthetic femur fracture;  The patient did consent to the procedure after discussion of the risks and benefits.  PREOPERATIVE DIAGNOSIS: Left periprosthetic femur fracture with 3 retained cables from prior surgery  POSTOPERATIVE DIAGNOSIS: Same.  PROCEDURE:  1. Open reduction internal fixation of left periprosthetic femur fracture 2. Removal of 3 retained cables from previous surgery  SURGEON: N. Eduard Roux, M.D.  ASSIST: Ciro Backer [place], [place]; necessary for the timely completion of procedure and due to complexity of procedure.  ANESTHESIA:  general  IV FLUIDS AND URINE: See anesthesia.  ESTIMATED BLOOD LOSS: 1000 mL.  IMPLANTS: Zimmer NCB  DRAINS: HVAC  COMPLICATIONS: None.  DESCRIPTION OF PROCEDURE: The patient was brought to the operating room and placed lateral on the operating table.  The patient had been signed prior to the procedure and this was documented. The patient had the anesthesia placed by the anesthesiologist.  A time-out was performed to confirm that this was the correct patient, site, side and location. The patient did receive antibiotics prior to the incision and was re-dosed during the procedure as needed at indicated intervals. The patient had the operative extremity prepped and draped in the standard surgical fashion.    An incision was made on the lateral aspect of the thigh that extended from the knee up to the hip.  Dissection was carried through the subcutaneous tissue.  Full-thickness flaps were elevated.  The IT band was then sharply incised in line with the incision.  A sub-vastus approach was then performed.  The fracture was then exposed and demonstrated significant comminution posteriorly.  The fracture did extend up to the level of the femoral stem but this did not compromise the stability of the femoral component.  We first found the 3 retained  cables near the proximal portion of the femoral shaft.  Using a rongeur and osteotome the cables were freed up from the cortical bone.  The cables were then cut and removed.  We then turned our attention to the fracture.  The fracture was then brought out to length and alignment and overall reduction and provisional cables were used to achieve the reduction.  We then placed a 15 hole periprosthetic plate on the lateral aspect of the femur which was then confirmed under fluoroscopic guidance.  We provisionally fix this plate to the bone using K wires.  We then placed a couple of nonlocking screws distally through the plate into the metaphyseal region of the distal femoral shaft each with excellent purchase.  We then placed a bicortical nonlocking screw proximally anterior to the femoral stem in order to contour the plate to the bone.  I then placed 2 more locking unicortical screws proximally.  After this I then turned my attention to the distal cluster again.  3 more nonlocking screws were placed parallel to the joint bicortically each with excellent purchase.  I then placed 2 more cables around the proximal portion of the fracture just distal to the femoral stem to provide extra fixation.  Locking caps were then placed over each screw and tightened down with a torque limiter.  Final x-rays were taken.  The wound was then thoroughly irrigated with 3 L normal saline.  1 g of vancomycin powder was placed in the sub-vastus layer.  A medium Hemovac drain was also placed.  The IT band was closed with interrupted #1 Vicryl.  Another gram of vancomycin powder was  placed in subcutaneous fat.  The fat was closed with 0 Vicryl.  Subcuticular layer closed with 2-0 Vicryl.  Skin was closed with staples.  Sterile dressings were applied.  Ace wrap was placed for compression.  Patient tolerated procedure well had no immediate complications.  POSTOPERATIVE PLAN: Nonweightbearing to the left lower extremity.  She will likely need  placement in a SNF.  Azucena Cecil, MD [place] 5:07 PM

## 2018-04-21 NOTE — Anesthesia Procedure Notes (Signed)
Procedure Name: Intubation Date/Time: 04/21/2018 2:53 PM Performed by: Jenne Campus, CRNA Pre-anesthesia Checklist: Patient identified, Emergency Drugs available, Suction available and Patient being monitored Patient Re-evaluated:Patient Re-evaluated prior to induction Oxygen Delivery Method: Circle System Utilized Preoxygenation: Pre-oxygenation with 100% oxygen Induction Type: IV induction and Cricoid Pressure applied Ventilation: Mask ventilation without difficulty Laryngoscope Size: Miller and 2 Grade View: Grade I Tube type: Oral Tube size: 7.5 mm Number of attempts: 1 Airway Equipment and Method: Stylet Placement Confirmation: ETT inserted through vocal cords under direct vision,  positive ETCO2 and breath sounds checked- equal and bilateral Secured at: 23 cm Tube secured with: Tape Dental Injury: Teeth and Oropharynx as per pre-operative assessment  Comments: Performed without difficulty by Oswaldo Milian, SRNA

## 2018-04-21 NOTE — Plan of Care (Signed)
  Problem: Safety: Goal: Ability to remain free from injury will improve Outcome: Progressing   

## 2018-04-21 NOTE — Progress Notes (Signed)
PROGRESS NOTE    Teresa Frey  PZW:[id number] DOB: 1929/07/12 DOA: 04/19/2018 PCP: [name], [name], MD   Brief Narrative:  84 year old with past medical history relevant for breast cancer in remission on anastrozole, depression/anxiety, chronic atrial fibrillation on warfarin, hypertension, hyperlipidemia admitted with fall and found to have periprosthetic left femur fracture.  Her course has been complicated by an episode of chest pain.  She was evaluated yesterday for chest pain her troponins were stable and echocardiogram was unremarkable EKG was unchanged.  It was felt that she had some strain related abnormalities.  She is stable to proceed with surgery   Assessment & Plan:   Principal Problem:   Periprosthetic fracture around internal prosthetic left hip joint ([place]) Active Problems:   Essential hypertension   Breast cancer of upper-outer quadrant of right female breast ([place])   A-fib ([place])  #) chest pain: 2 nights ago the patient complained of substernal chest pain.  Pain was while she was lying in bed.  It did not radiate anywhere.  It was dull and primarily in the center of her chest. -Troponin elevated to 0.03, troponins were cycled and stable with no peak -EKG evaluated and unchanged compared to prior -Echo obtained and showed no wall motion abnormalities or concerning findings  #) Left periprosthetic hip fracture: Pending OR 04/21/2018 (today) with [place] a VTE prophylaxis his INR is likely elevated --Discussed at length with the patient regarding proper footwear to prevent falls in the future.  #) Chronic atrial fibrillation: -Hold warfarin -Pharmacy consult - Continue metoprolol tartrate 25 mg daily  #) Hypertension/hyperlipidemia: -Continue HCTZ 12.5 mg daily -Continue metoprolol tartrate 25 mg daily -Continue omega-3 fatty acids  #) Pain/psych: -Continue sertraline 50 mg daily  #) breast cancer in remission: -Continue  anastrozole  Fluids: Tolerating p.o. Elect lites: Monitor and supplement Nutrition: Heart healthy diet, n.p.o. at midnight  Prophylaxis: Elevated INR  Disposition: Pending PT evaluation  Full code  Consultants:   Orthopedic surgery, [facility]  Procedures:   Echo 04/20/2018: Pending  Antimicrobials:   None  Subjective: No new complaints.  She states that she tripped and fractured her hip causing a periprosthetic hip fracture over her flip-flops.  Long discussion with the patient regarding proper foot wear and discussed with her to absolutely avoid flip-flops.  I also suggested she wear shoes that have a safe strap or covered her heel.  I recommend she not use any slip on slippers type shoes but rather a full foot slipper.  Objective: Vitals:   04/20/18 2245 04/21/18 0138 04/21/18 0505 04/21/18 0938  BP: (!) 150/59 (!) 124/56 (!) 131/53 (!) 126/57  Pulse: 63 69 67 70  Resp: 17 17  16   Temp: 99 F (37.2 C) 99 F (37.2 C) 98.4 F (36.9 C) 98.2 F (36.8 C)  TempSrc: Oral Oral Oral Oral  SpO2: 98% 99% 99% 99%  Weight:      Height:        Intake/Output Summary (Last 24 hours) at 04/21/2018 1140 Last data filed at 04/21/2018 0513 Gross per 24 hour  Intake 392.17 ml  Output 750 ml  Net -357.83 ml   Filed Weights   04/19/18 1829  Weight: 73 kg    Examination:  General exam: Appears calm and comfortable  Respiratory system: Clear to auscultation. Respiratory effort normal. Cardiovascular system: S1 & S2 heard, RRR. No JVD, murmurs, rubs, gallops or clicks. No pedal edema. Gastrointestinal system: Abdomen is nondistended, soft and nontender. No organomegaly or  masses felt. Normal bowel sounds heard. Central nervous system: Alert and oriented. No focal neurological deficits. Extremities: Symmetric 5 x 5 power. Skin: No rashes, lesions or ulcers Psychiatry: Judgement and insight appear normal. Mood & affect appropriate.     Data Reviewed: I have personally  reviewed following labs and imaging studies  CBC: Recent Labs  Lab 04/18/18 1027 04/19/18 1832 04/20/18 0348 04/21/18 0344  WBC 8.9 18.9* 13.8* 12.4*  NEUTROABS 5.1 15.4*  --   --   HGB 14.6 14.0 11.9* 10.7*  HCT 45.7 43.2 38.2 34.4*  MCV 92.9 92.1 92.5 94.0  PLT 169 214 208 854   Basic Metabolic Panel: Recent Labs  Lab 04/18/18 1027 04/19/18 1832 04/20/18 0348 04/21/18 0344  NA 142 139 139 139  K 4.0 3.7 3.8 4.3  CL 106 106 102 100  CO2 29 25 26  34*  GLUCOSE 86 180* 185* 155*  BUN 15 17 21 18   CREATININE 0.74 0.75 0.95 0.93  CALCIUM 10.0 9.3 9.1 9.2   GFR: Estimated Creatinine Clearance: 41.5 mL/min (by C-G formula based on SCr of 0.93 mg/dL). Liver Function Tests: Recent Labs  Lab 04/18/18 1027 04/19/18 1832  AST 16 19  ALT 14 16  ALKPHOS 186* 149*  BILITOT 0.4 0.7  PROT 6.8 6.6  ALBUMIN 3.8 3.8   No results for input(s): LIPASE, AMYLASE in the last 168 hours. No results for input(s): AMMONIA in the last 168 hours. Coagulation Profile: Recent Labs  Lab 04/19/18 1832 04/21/18 0344  INR 2.31 2.07   Cardiac Enzymes: Recent Labs  Lab 04/20/18 0348 04/20/18 1002 04/20/18 1617 04/20/18 2228  TROPONINI 0.03* <0.03 <0.03 <0.03   Recent Results (from the past 240 hour(s))  MRSA PCR Screening     Status: None   Collection Time: 04/19/18 11:57 PM  Result Value Ref Range Status   MRSA by PCR NEGATIVE NEGATIVE Final    Comment:        The GeneXpert MRSA Assay (FDA approved for NASAL specimens only), is one component of a comprehensive MRSA colonization surveillance program. It is not intended to diagnose MRSA infection nor to guide or monitor treatment for MRSA infections. Performed at [facility], [place] [address] [place], [place] [address]          Radiology Studies: Dg Pelvis Portable  Result Date: 04/19/2018 CLINICAL DATA:  Fall with left leg pain EXAM: PORTABLE PELVIS 1-2 VIEWS COMPARISON:  03/04/2017 FINDINGS: Coarse  calcification in the pelvis may reflect fibroid. Status post bilateral hip replacement with normal alignment. Pubic symphysis and rami appear intact. IMPRESSION: Status post bilateral hip replacement. No definite acute osseous abnormality. Electronically Signed   By: Donavan Foil M.D.   On: 04/19/2018 19:28   Dg Chest Portable 1 View  Result Date: 04/19/2018 CLINICAL DATA:  Elevated WBC, shortness of breath EXAM: PORTABLE CHEST 1 VIEW COMPARISON:  06/28/2017 FINDINGS: There is no focal consolidation. There is chronic bilateral interstitial thickening. The lungs are hyperinflated likely secondary to COPD. There is no pleural effusion or pneumothorax. There is stable cardiomegaly. The osseous structures are unremarkable. IMPRESSION: No acute cardiopulmonary disease. Electronically Signed   By: Kathreen Devoid   On: 04/19/2018 21:22   Dg Femur Port 1v Left  Result Date: 04/19/2018 CLINICAL DATA:  Fall with leg pain EXAM: LEFT FEMUR PORTABLE 1 VIEW COMPARISON:  None. FINDINGS: Status post left hip replacement. Acute mildly comminuted fracture at the mid to distal shaft of the femur, just caudal to the tip  of the femoral prosthesis. Close to 1 bone with of anterior and lateral displacement of dominant fracture fragment. IMPRESSION: Acute, mildly comminuted and displaced fracture involving the mid to distal shaft of the left femur, just caudal to the tip of the femoral prosthesis. Electronically Signed   By: Donavan Foil M.D.   On: 04/19/2018 19:30        Scheduled Meds: . anastrozole  1 mg Oral Daily  . calcium-vitamin D  2 tablet Oral Daily  . hydrochlorothiazide  12.5 mg Oral Daily  . metoprolol tartrate  25 mg Oral Daily  . multivitamin with minerals  1 tablet Oral Daily  . omega-3 acid ethyl esters  1 g Oral Daily  . sertraline  50 mg Oral Daily  . vitamin B-12  1,000 mcg Oral Daily  . vitamin C  1,000 mg Oral Daily  . Warfarin - Pharmacist Dosing Inpatient   Does not apply q1800    Continuous Infusions:   LOS: 2 days    Time spent: 35 minutes    Lady Deutscher, MD FACP [facility] Pager [phone]  If 7PM-7AM, please contact night-coverage [email or web address] Password [email or web address] 04/21/2018, 11:40 AM

## 2018-04-21 NOTE — Progress Notes (Signed)
ANTICOAGULATION CONSULT NOTE - Follow Up Consult  Pharmacy Consult for coumadin Indication: atrial fibrillation  Allergies  Allergen Reactions  . Tramadol     Loss of control    Patient Measurements: Height: 5\' 3"  (160 cm) Weight: 161 lb (73 kg) IBW/kg (Calculated) : 52.4  Vital Signs: Temp: 98.2 F (36.8 C) (11/18 0938) Temp Source: Oral (11/18 0938) BP: 126/57 (11/18 0938) Pulse Rate: 70 (11/18 0938)  Labs: Recent Labs    04/19/18 1832  04/20/18 0348 04/20/18 1002 04/20/18 1617 04/20/18 2228 04/21/18 0344  HGB 14.0  --  11.9*  --   --   --  10.7*  HCT 43.2  --  38.2  --   --   --  34.4*  PLT 214  --  208  --   --   --  188  LABPROT 25.1*  --   --   --   --   --  23.1*  INR 2.31  --   --   --   --   --  2.07  CREATININE 0.75  --  0.95  --   --   --  0.93  TROPONINI  --    < > 0.03* <0.03 <0.03 <0.03  --    < > = values in this interval not displayed.    Estimated Creatinine Clearance: 41.5 mL/min (by C-G formula based on SCr of 0.93 mg/dL).   Medical History: Past Medical History:  Diagnosis Date  . ANXIETY 04/07/2007  . Breast cancer of upper-outer quadrant of right female breast ([place]) 01/11/2016  . COLONIC POLYPS, HX OF 04/02/2007   adenomatous  . DIVERTICULITIS, HX OF 04/02/2007  . Diverticulosis   . DJD (degenerative joint disease)   . Hemorrhoids   . HYPERTENSION 04/02/2007  . OSTEOARTHRITIS, SEVERE 04/07/2007  . 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 WEIGHT GAIN 11/13/2007    Assessment: Teresa Frey is an 84yo female presenting s/p fall with periprosthetic hip fracture now pending OR on 04/21/2018. Pharmacy consulted to manage coumadin but holding dose yesterday and today in preparation for surgery this afternoon. Of note, patient received a dose of Vit K 10 mg IV overnight. INR today down to 2.07.    Goal of Therapy:  INR 2-3 Monitor platelets by anticoagulation protocol: Yes   Plan:  Hold warfarin tonight. Will plan to resume  tomorrow after surgery is completed  Monitor daily INR, CBC, s/sx of bleeding  Thank you for involving pharmacy in this patient's care.  Albertina Parr, PharmD., BCPS Clinical Pharmacist Clinical phone for 04/21/18 until 3:30pm: [phone] If after 3:30pm, please refer to [facility] for unit-specific pharmacist

## 2018-04-21 NOTE — Transfer of Care (Signed)
Immediate Anesthesia Transfer of Care Note  Patient: Teresa Frey  Procedure(s) Performed: OPEN REDUCTION INTERNAL FIXATION (ORIF) PERI PROSTHETIC FEMUR FRACTURE (Left Leg Upper)  Patient Location: PACU  Anesthesia Type:General  Level of Consciousness: drowsy and patient cooperative  Airway & Oxygen Therapy: Patient Spontanous Breathing and Patient connected to face mask oxygen  Post-op Assessment: Report given to RN and Post -op Vital signs reviewed and stable  Post vital signs: Reviewed and stable  Last Vitals:  Vitals Value Taken Time  BP 173/83 04/21/2018  5:47 PM  Temp 36.3 C 04/21/2018  5:48 PM  Pulse 81 04/21/2018  5:52 PM  Resp 16 04/21/2018  5:52 PM  SpO2 100 % 04/21/2018  5:52 PM  Vitals shown include unvalidated device data.  Last Pain:  Vitals:   04/21/18 1748  TempSrc:   PainSc: 0-No pain      Patients Stated Pain Goal: 4 (75/79/72 8206)  Complications: No apparent anesthesia complications

## 2018-04-21 NOTE — Anesthesia Postprocedure Evaluation (Signed)
Anesthesia Post Note  Patient: Jaysha E Donath  Procedure(s) Performed: OPEN REDUCTION INTERNAL FIXATION (ORIF) PERI PROSTHETIC FEMUR FRACTURE (Left Leg Upper)     Patient location during evaluation: PACU Anesthesia Type: General Level of consciousness: awake and alert Pain management: pain level controlled Vital Signs Assessment: post-procedure vital signs reviewed and stable Respiratory status: spontaneous breathing, nonlabored ventilation, respiratory function stable and patient connected to nasal cannula oxygen Cardiovascular status: blood pressure returned to baseline and stable Postop Assessment: no apparent nausea or vomiting Anesthetic complications: no    Last Vitals:  Vitals:   04/21/18 1901 04/21/18 1916  BP: 135/60 (!) 130/53  Pulse: 76 81  Resp: 14 11  Temp:    SpO2: 98% 98%    Last Pain:  Vitals:   04/21/18 1915  TempSrc:   PainSc: 0-No pain                 Catalaya Garr,W. EDMOND

## 2018-04-21 NOTE — Anesthesia Preprocedure Evaluation (Addendum)
Anesthesia Evaluation  Patient identified by MRN, date of birth, ID band Patient awake    Reviewed: Allergy & Precautions, NPO status , Patient's Chart, lab work & pertinent test results  Airway Mallampati: II  TM Distance: >3 FB Neck ROM: Full    Dental  (+) Teeth Intact, Dental Advisory Given   Pulmonary sleep apnea , former smoker,    breath sounds clear to auscultation       Cardiovascular hypertension, Pt. on home beta blockers and Pt. on medications + dysrhythmias Atrial Fibrillation  Rhythm:Regular Rate:Normal     Neuro/Psych Anxiety    GI/Hepatic negative GI ROS, Neg liver ROS,   Endo/Other  negative endocrine ROS  Renal/GU negative Renal ROS     Musculoskeletal  (+) Arthritis ,   Abdominal Normal abdominal exam  (+)   Peds  Hematology negative hematology ROS (+)   Anesthesia Other Findings   Reproductive/Obstetrics                            Lab Results  Component Value Date   WBC 12.4 (H) 04/21/2018   HGB 10.7 (L) 04/21/2018   HCT 34.4 (L) 04/21/2018   MCV 94.0 04/21/2018   PLT 188 04/21/2018   Lab Results  Component Value Date   CREATININE 0.93 04/21/2018   BUN 18 04/21/2018   NA 139 04/21/2018   K 4.3 04/21/2018   CL 100 04/21/2018   CO2 34 (H) 04/21/2018   Lab Results  Component Value Date   INR 2.07 04/21/2018   INR 2.31 04/19/2018   INR 2.3 04/09/2018   EKG: SB, incomplete RBBB.  Echo: - Left ventricle: The cavity size was normal. Wall thickness was   increased in a pattern of mild LVH. Systolic function was normal.   The estimated ejection fraction was in the range of 55% to 60%.   Wall motion was normal; there were no regional wall motion   abnormalities. Doppler parameters are consistent with abnormal   left ventricular relaxation (grade 1 diastolic dysfunction). - Aortic valve: Small systolic gradient across valve but no   significant stenosis  Sclerosis without stenosis. Valve area   (VTI): 2.45 cm^2. Valve area (Vmax): 2.16 cm^2. Valve area   (Vmean): 2.09 cm^2. - Mitral valve: Calcified annulus. Mildly thickened leaflets . Anesthesia Physical Anesthesia Plan  ASA: III  Anesthesia Plan: General   Post-op Pain Management:    Induction: Intravenous  PONV Risk Score and Plan: 4 or greater and Ondansetron, Dexamethasone and Treatment may vary due to age or medical condition  Airway Management Planned: Oral ETT  Additional Equipment: None  Intra-op Plan:   Post-operative Plan: Extubation in OR  Informed Consent: I have reviewed the patients History and Physical, chart, labs and discussed the procedure including the risks, benefits and alternatives for the proposed anesthesia with the patient or authorized representative who has indicated his/her understanding and acceptance.   Dental advisory given  Plan Discussed with: CRNA  Anesthesia Plan Comments:        Anesthesia Quick Evaluation

## 2018-04-21 NOTE — H&P (Signed)

## 2018-04-21 NOTE — Progress Notes (Signed)
Nutrition Brief Note  Received MD Consult for assessment of nutrition status from the Hip Fracture Protocol. Patient reports great intake PTA and yesterday. Currently NPO for hip repair surgery today.   Nutrition focused physical exam completed.  No muscle or subcutaneous fat depletion noticed.  No significant weight changes reported.  Wt Readings from Last 15 Encounters:  04/19/18 73 kg  04/18/18 74 kg  02/24/18 74.8 kg  11/20/17 73 kg  10/18/17 72.3 kg  09/05/17 72.1 kg  08/08/17 71.7 kg  07/04/17 71.6 kg  06/28/17 70.3 kg  04/19/17 73 kg  03/06/17 72 kg  01/04/17 72.8 kg  12/21/16 72.9 kg  11/20/16 72.7 kg  08/01/16 71.2 kg    Body mass index is 28.52 kg/m. Patient meets criteria for overweight based on current BMI.   Current diet order is NPO, on heart healthy diet yesterday and consumed 100% of meals. Labs and medications reviewed.   No nutrition interventions warranted at this time. If nutrition issues arise, please consult RD.   Molli Barrows, RD, LDN, [place] Pager [phone] After Hours Pager [phone]

## 2018-04-21 NOTE — Plan of Care (Signed)

## 2018-04-21 NOTE — Progress Notes (Signed)
Full note to follow. EKG and ECHO reviewed.  Troponin elevation likely due to strain from metabolic effects of pain and periprosthetic hip fx.  May proceed to surgery.  Cardiac risk from non-cardiac surgery for this pt is NOT increased compared to pts with similar medical conditions.

## 2018-04-22 ENCOUNTER — Encounter (HOSPITAL_COMMUNITY): Payer: Self-pay | Admitting: Orthopaedic Surgery

## 2018-04-22 LAB — CBC
HEMATOCRIT: 26.8 % — AB (ref 36.0–46.0)
Hemoglobin: 8.4 g/dL — ABNORMAL LOW (ref 12.0–15.0)
MCH: 29 pg (ref 26.0–34.0)
MCHC: 31.3 g/dL (ref 30.0–36.0)
MCV: 92.4 fL (ref 80.0–100.0)
PLATELETS: 139 10*3/uL — AB (ref 150–400)
RBC: 2.9 MIL/uL — AB (ref 3.87–5.11)
RDW: 13.7 % (ref 11.5–15.5)
WBC: 11.6 10*3/uL — ABNORMAL HIGH (ref 4.0–10.5)
nRBC: 0 % (ref 0.0–0.2)

## 2018-04-22 LAB — BASIC METABOLIC PANEL
Anion gap: 6 (ref 5–15)
BUN: 12 mg/dL (ref 8–23)
CO2: 30 mmol/L (ref 22–32)
Calcium: 8 mg/dL — ABNORMAL LOW (ref 8.9–10.3)
Chloride: 101 mmol/L (ref 98–111)
Creatinine, Ser: 0.64 mg/dL (ref 0.44–1.00)
GFR calc Af Amer: >60 mL/min (ref >60)
GLUCOSE: 148 mg/dL — AB (ref 70–99)
Potassium: 4.1 mmol/L (ref 3.5–5.1)
Sodium: 137 mmol/L (ref 135–145)

## 2018-04-22 LAB — PREPARE RBC (CROSSMATCH)

## 2018-04-22 LAB — PREPARE FRESH FROZEN PLASMA
Unit division: 0
Unit division: 0

## 2018-04-22 LAB — BPAM FFP
BLOOD PRODUCT EXPIRATION DATE: 201911232359
Blood Product Expiration Date: 201911202359
ISSUE DATE / TIME: 201911181345
ISSUE DATE / TIME: 201911181546
UNIT TYPE AND RH: 5100
UNIT TYPE AND RH: 5100

## 2018-04-22 MED ORDER — WARFARIN SODIUM 7.5 MG PO TABS
15.0000 mg | ORAL_TABLET | Freq: Once | ORAL | Status: AC
  Administered 2018-04-22: 15 mg via ORAL
  Filled 2018-04-22: qty 2

## 2018-04-22 NOTE — Progress Notes (Signed)
PROGRESS NOTE    Teresa Frey  AVW:[id number] DOB: 1929-07-23 DOA: 04/19/2018 PCP: [name], [name], MD   Brief Narrative:   84 year old with past medical history relevant for breast cancer in remission on anastrozole, depression/anxiety, chronic atrial fibrillation on warfarin, hypertension, hyperlipidemia admitted with fall and found to have periprosthetic left femur fracture. Her course has been complicated by an episode of chest pain.  She was evaluated yesterday for chest pain her troponins were stable and echocardiogram was unremarkable EKG was unchanged.  It was felt that she had some strain related abnormalities.  pt tolerated surgery well.  Assessment & Plan:   Principal Problem:   Periprosthetic fracture around internal prosthetic left hip joint (HCC) Active Problems:   Essential hypertension   Breast cancer of upper-outer quadrant of right female breast ([place])   A-fib ([place])   #) chest pain (resolved): 3 nights ago the patient complained of substernal chest pain. Pain was while she was lying in bed. It did not radiate anywhere. It was dull and primarily in the center of her chest. -Troponin elevated to 0.03, troponins were cycled and stable with no peak -EKG evaluated and unchanged compared to prior -Echo obtained and showed no wall motion abnormalities or concerning findings  #) Left periprosthetic hip fracture: Post operative day #1 status post open reduction internal fixation of periprosthetic femur fracture.   -Warfarin restarted yesterday.  Patient to get first dose tonight. --Discussed at length with the patient regarding proper footwear to prevent falls in the future.  #) Chronic atrial fibrillation: -Warfarin restarted -Pharmacy consult - Continue metoprolol tartrate 25 mg daily  #) Hypertension/hyperlipidemia: -Continue HCTZ 12.5 mg daily -Continue metoprolol tartrate 25 mg daily -Continue omega-3 fatty acids  #) Pain/psych: -Continue sertraline 50  mg daily  #) breast cancer in remission: -Continue anastrozole  Fluids: Tolerating p.o. Elect lites: Monitor and supplement Nutrition: Heart healthy diet, n.p.o. at midnight  Prophylaxis: Elevated INR  Disposition: Patient lives alone, her daughter lives nearby, she hopes to go back home but she might require skilled facility placement.  Case management and social work consulted.  Full code  Consultants:  Orthopedic surgery, [facility]  Procedures:  Echo 04/20/2018:  Left ventricle: The cavity size was normal. Wall thickness was   increased in a pattern of mild LVH. Systolic function was normal.   The estimated ejection fraction was in the range of 55% to 60%.   Wall motion was normal; there were no regional wall motion   abnormalities. Doppler parameters are consistent with abnormal   left ventricular relaxation (grade 1 diastolic dysfunction). - Aortic valve: Small systolic gradient across valve but no   significant stenosis Sclerosis without stenosis. Valve area   (VTI): 2.45 cm^2. Valve area (Vmax): 2.16 cm^2. Valve area   (Vmean): 2.09 cm^2. - Mitral valve: Calcified annulus. Mildly thickened leaflets .  Antimicrobials:  None    Subjective: Patient with markedly less pain today.  Feeling better.  Has not yet gotten out of bed when I saw her this morning  Objective: Vitals:   04/22/18 0044 04/22/18 0502 04/22/18 1036 04/22/18 1122  BP: (!) 107/47 (!) 132/43 (!) 115/48 (!) 109/49  Pulse: 82 82 76   Resp:   14   Temp: 98.5 F (36.9 C) 98.4 F (36.9 C) 99.5 F (37.5 C)   TempSrc: Oral Oral Oral   SpO2: 100% 99% 92%   Weight:      Height:        Intake/Output Summary (Last 24  hours) at 04/22/2018 1241 Last data filed at 04/21/2018 1752 Gross per 24 hour  Intake 2941 ml  Output 1140 ml  Net 1801 ml   Filed Weights   04/19/18 1829 04/21/18 1258  Weight: 73 kg 73 kg    Examination:  General exam: Appears calm and comfortable    Respiratory system: Clear to auscultation. Respiratory effort normal. Cardiovascular system: S1 & S2 heard, RRR. No JVD, murmurs, rubs, gallops or clicks. No pedal edema. Gastrointestinal system: Abdomen is nondistended, soft and nontender. No organomegaly or masses felt. Normal bowel sounds heard. Central nervous system: Alert and oriented. No focal neurological deficits. Extremities: Symmetric 5 x 5 power. Skin: No rashes, lesions or ulcers Psychiatry: Judgement and insight appear normal. Mood & affect appropriate.     Data Reviewed: I have personally reviewed following labs and imaging studies  CBC: Recent Labs  Lab 04/18/18 1027  04/19/18 1832 04/20/18 0348 04/21/18 0344 04/21/18 1718 04/21/18 1815 04/22/18 0211  WBC 8.9  --  18.9* 13.8* 12.4*  --  23.2* 11.6*  NEUTROABS 5.1  --  15.4*  --   --   --   --   --   HGB 14.6   < > 14.0 11.9* 10.7* 9.9* 10.3* 8.4*  HCT 45.7  --  43.2 38.2 34.4* 29.0* 31.8* 26.8*  MCV 92.9  --  92.1 92.5 94.0  --  93.5 92.4  PLT 169  --  214 208 188  --  190 139*   < > = values in this interval not displayed.   Basic Metabolic Panel: Recent Labs  Lab 04/18/18 1027 04/19/18 1832 04/20/18 0348 04/21/18 0344 04/21/18 1718 04/22/18 0211  NA 142 139 139 139 140 137  K 4.0 3.7 3.8 4.3 4.0 4.1  CL 106 106 102 100  --  101  CO2 29 25 26  34*  --  30  GLUCOSE 86 180* 185* 155* 125* 148*  BUN 15 17 21 18   --  12  CREATININE 0.74 0.75 0.95 0.93  --  0.64  CALCIUM 10.0 9.3 9.1 9.2  --  8.0*   GFR: Estimated Creatinine Clearance: 48.3 mL/min (by C-G formula based on SCr of 0.64 mg/dL). Liver Function Tests: Recent Labs  Lab 04/18/18 1027 04/19/18 1832  AST 16 19  ALT 14 16  ALKPHOS 186* 149*  BILITOT 0.4 0.7  PROT 6.8 6.6  ALBUMIN 3.8 3.8   No results for input(s): LIPASE, AMYLASE in the last 168 hours. No results for input(s): AMMONIA in the last 168 hours. Coagulation Profile: Recent Labs  Lab 04/19/18 1832 04/21/18 0344  INR  2.31 2.07   Cardiac Enzymes: Recent Labs  Lab 04/20/18 0348 04/20/18 1002 04/20/18 1617 04/20/18 2228  TROPONINI 0.03* <0.03 <0.03 <0.03     Recent Results (from the past 240 hour(s))  MRSA PCR Screening     Status: None   Collection Time: 04/19/18 11:57 PM  Result Value Ref Range Status   MRSA by PCR NEGATIVE NEGATIVE Final    Comment:        The GeneXpert MRSA Assay (FDA approved for NASAL specimens only), is one component of a comprehensive MRSA colonization surveillance program. It is not intended to diagnose MRSA infection nor to guide or monitor treatment for MRSA infections. Performed at [facility], [place] [address] [place], [place] [address]          Radiology Studies: Dg C-arm 1-60 Min  Result Date: 04/21/2018 CLINICAL DATA:  Open reduction  and internal fixation of left femur fracture. EXAM: DG C-ARM 61-120 MIN; LEFT FEMUR 2 VIEWS FLUOROSCOPY TIME:  2 minutes 7 seconds. COMPARISON:  Radiographs of April 19, 2018. FINDINGS: Five intraoperative fluoroscopic images of the left femur demonstrate the patient be status post open reduction and surgical internal fixation of left periprosthetic fracture. Good alignment of fracture components is noted. IMPRESSION: Status post open reduction and internal fixation of left femoral periprosthetic fracture. Electronically Signed   By: Marijo Conception, M.D.   On: 04/21/2018 16:46   Dg C-arm 1-60 Min  Result Date: 04/21/2018 CLINICAL DATA:  Open reduction and internal fixation of left femur fracture. EXAM: DG C-ARM 61-120 MIN; LEFT FEMUR 2 VIEWS FLUOROSCOPY TIME:  2 minutes 7 seconds. COMPARISON:  Radiographs of April 19, 2018. FINDINGS: Five intraoperative fluoroscopic images of the left femur demonstrate the patient be status post open reduction and surgical internal fixation of left periprosthetic fracture. Good alignment of fracture components is noted. IMPRESSION: Status post open reduction and internal fixation  of left femoral periprosthetic fracture. Electronically Signed   By: Marijo Conception, M.D.   On: 04/21/2018 16:46   Dg Femur Min 2 Views Left  Result Date: 04/21/2018 CLINICAL DATA:  Open reduction and internal fixation of left femur fracture. EXAM: DG C-ARM 61-120 MIN; LEFT FEMUR 2 VIEWS FLUOROSCOPY TIME:  2 minutes 7 seconds. COMPARISON:  Radiographs of April 19, 2018. FINDINGS: Five intraoperative fluoroscopic images of the left femur demonstrate the patient be status post open reduction and surgical internal fixation of left periprosthetic fracture. Good alignment of fracture components is noted. IMPRESSION: Status post open reduction and internal fixation of left femoral periprosthetic fracture. Electronically Signed   By: Marijo Conception, M.D.   On: 04/21/2018 16:46   Dg Femur Port Min 2 Views Left  Result Date: 04/21/2018 CLINICAL DATA:  ORIF of left femur. EXAM: LEFT FEMUR PORTABLE 2 VIEWS COMPARISON:  Intraoperative films of earlier today. FINDINGS: 1821 hours. Left hip arthroplasty. Lateral plate and screw/cerclage wire fixation of the mid to distal femoral shaft. Comminuted fracture of the mid femoral shaft again identified. Alignment is nearly anatomic. Gas within the suprapatellar bursa of the knee. IMPRESSION: Postop for placement of an internal fixation device across the previously described comminuted femoral fracture. Electronically Signed   By: Abigail Miyamoto M.D.   On: 04/21/2018 20:49        Scheduled Meds: . sodium chloride   Intravenous Once  . sodium chloride   Intravenous Once  . acetaminophen  500 mg Oral Q6H  . anastrozole  1 mg Oral Daily  . calcium-vitamin D  2 tablet Oral Daily  . docusate sodium  100 mg Oral BID  . enoxaparin (LOVENOX) injection  40 mg Subcutaneous Q24H  . hydrochlorothiazide  12.5 mg Oral Daily  . metoprolol tartrate  25 mg Oral Daily  . multivitamin with minerals  1 tablet Oral Daily  . omega-3 acid ethyl esters  1 g Oral Daily  .  sertraline  50 mg Oral Daily  . tranexamic acid (CYKLOKAPRON) topical -INTRAOP  2,000 mg Topical Once  . tranexamic acid (CYKLOKAPRON) topical -INTRAOP  2,000 mg Topical Once  . vitamin B-12  1,000 mcg Oral Daily  . vitamin C  1,000 mg Oral Daily  . Warfarin - Pharmacist Dosing Inpatient   Does not apply q1800   Continuous Infusions: . sodium chloride    . sodium chloride 75 mL/hr at 04/21/18 2120  . methocarbamol (ROBAXIN) IV    .  phytonadione (VITAMIN K) IV       LOS: 3 days    Time spent: 30 minutes    Lady Deutscher, MD [facility] Pager [phone]  If 7PM-7AM, please contact night-coverage [email or web address] Password [email or web address] 04/22/2018, 12:41 PM

## 2018-04-22 NOTE — Progress Notes (Signed)
CCMD called.  Patient's heart rate has been going up to 170's non sustaining, and then coming down.  Hx of afib.  Patient is alert and oriented x 4, denies pain.  Sent Amion message to on-call as an [place].

## 2018-04-22 NOTE — Plan of Care (Signed)
  Problem: Education: Goal: Knowledge of General Education information will improve Description: Including pain rating scale, medication(s)/side effects and non-pharmacologic comfort measures Outcome: Progressing   Problem: Clinical Measurements: Goal: Respiratory complications will improve Outcome: Progressing   Problem: Nutrition: Goal: Adequate nutrition will be maintained Outcome: Progressing   Problem: Coping: Goal: Level of anxiety will decrease Outcome: Progressing   Problem: Safety: Goal: Ability to remain free from injury will improve Outcome: Progressing   Problem: Skin Integrity: Goal: Risk for impaired skin integrity will decrease Outcome: Progressing   

## 2018-04-22 NOTE — Evaluation (Signed)
Occupational Therapy Evaluation Patient Details Name: Teresa Frey MRN: [id number] DOB: 1930-02-21 Today's Date: 04/22/2018    History of Present Illness Pt is a 84 y/o female s/p ORIF L Periprosthetic femur fracture after a trip and fall at home. PMH includes but is not limited to A-fib, HTN, Breast Ca (remission x5 yrs). Pt lives alone and works full time.   Clinical Impression   Pt admitted as above currently demonstrating deficits in her ability to perform ADL and functional transfers, lives alone (please see OT problem list below). She is currently +2 Mod A SPT 2* NWB LLE and should benefit from acute OT to assist in maximizing independence with ADL's and functional transfers prior to d/c to SNF for short term rehab. Will follow.    Follow Up Recommendations  SNF;Supervision/Assistance - 24 hour    Equipment Recommendations  Other (comment)(To be determined at next venue)    Recommendations for Other Services       Precautions / Restrictions Precautions Precautions: Fall;Other (comment) Precaution Comments: NWB LLE following ORIF Periprosthetic femur fracture Restrictions Weight Bearing Restrictions: Yes LLE Weight Bearing: Non weight bearing      Mobility Bed Mobility Overal bed mobility: Needs Assistance Bed Mobility: Supine to Sit     Supine to sit: Mod assist;+2 for physical assistance;+2 for safety/equipment     General bed mobility comments: Assist with LLE, pad and assist to elevate trunk using rails   Transfers Overall transfer level: Needs assistance Equipment used: Rolling walker (2 wheeled) Transfers: Sit to/from [employer] Sit to Stand: Mod assist;+2 physical assistance;+2 safety/equipment;From elevated surface Stand pivot transfers: Mod assist;+2 physical assistance;+2 safety/equipment       General transfer comment: VC's for NWB LLE, pt able to maintain with +2 assist and vc's. Very motivated to return to indpendence.     Balance Overall balance assessment: Needs assistance Sitting-balance support: Single extremity supported;Bilateral upper extremity supported;Feet supported Sitting balance-Leahy Scale: Fair     Standing balance support: Bilateral upper extremity supported Standing balance-Leahy Scale: Poor Standing balance comment: Heavily reliant on bilateral UE's secondary to NWB LLE, +2 Mod A                           ADL either performed or assessed with clinical judgement   ADL Overall ADL's : Needs assistance/impaired Eating/Feeding: Set up;Bed level;Sitting   Grooming: Wash/dry hands;Wash/dry face;Oral care;Set up;Sitting   Upper Body Bathing: Set up;Sitting   Lower Body Bathing: Sit to/from stand;Moderate assistance;+2 for physical assistance;+2 for safety/equipment Lower Body Bathing Details (indicate cue type and reason): +2 seconadry to L LLE NWB Upper Body Dressing : Set up;Sitting   Lower Body Dressing: Moderate assistance;+2 for physical assistance;+2 for safety/equipment;Sit to/from stand   Toilet Transfer: Moderate assistance;+2 for physical assistance;+2 for safety/equipment;Stand-pivot;RW;BSC Toilet Transfer Details (indicate cue type and reason): SPT to 3:1 simulated from EOB to chair this date. VC's for NWB LLE, pt able to maintain after cues. Toileting- Clothing Manipulation and Hygiene: +2 for physical assistance;+2 for safety/equipment;Sit to/from stand;Sitting/lateral lean;Cueing for safety;Maximal assistance       Functional mobility during ADLs: Moderate assistance;+2 for physical assistance;+2 for safety/equipment;Cueing for safety;Cueing for sequencing;Rolling walker(VC's for NWB LLE) General ADL Comments: Pt was seen for assessment followed by functional mobility/transfers and ADL retraining session for grooming when sitting up in recliner chair. Pt was educated in role of OT and current recommendations of SNF Rehab as she lives alone and requires  independence.  She is currently +2 Mod A for SPT and VC's for NWB LLE. Will follow acutely for OT.     Vision Baseline Vision/History: No visual deficits;Wears glasses Wears Glasses: At all times Patient Visual Report: No change from baseline Vision Assessment?: No apparent visual deficits     Perception     Praxis      Pertinent Vitals/Pain Pain Assessment: Faces Faces Pain Scale: Hurts even more Pain Descriptors / Indicators: Aching;Operative site guarding;Grimacing Pain Intervention(s): Limited activity within patient's tolerance;Monitored during session;Premedicated before session     Hand Dominance Right   Extremity/Trunk Assessment Upper Extremity Assessment Upper Extremity Assessment: Overall WFL for tasks assessed   Lower Extremity Assessment Lower Extremity Assessment: Defer to PT evaluation   Cervical / Trunk Assessment Cervical / Trunk Assessment: Normal   Communication Communication Communication: No difficulties   Cognition Arousal/Alertness: Awake/alert Behavior During Therapy: WFL for tasks assessed/performed Overall Cognitive Status: Within Functional Limits for tasks assessed                                 General Comments: Pt is very motivated, lives alone, works full time   [employer] Comments  Pt highly motivated to return to independent level of function. Agreeable to ST Rehab. Works full Scientific laboratory technician      Home Living Family/patient expects to be discharged to:: Private residence Living Arrangements: Alone Available Help at Discharge: Family;Available PRN/intermittently Type of Home: House Home Access: Stairs to enter Entrance Stairs-Number of Steps: 1 STE (Back door is ground level and has 1 STE living porch) Entrance Stairs-Rails: None Home Layout: Two level;Able to live on main level with bedroom/bathroom     Bathroom Shower/Tub: Tub/shower unit;Walk-in shower;Curtain(Uses tub)          Home Equipment: Shower seat - built in;Walker - 2 wheels;Cane - single point(Shower seat in walk in shower - pt does not use, prefers tub)          Prior Functioning/Environment Level of Independence: Independent        Comments: 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        OT Problem List: Decreased activity tolerance;Decreased knowledge of use of DME or AE;Impaired balance (sitting and/or standing);Pain;Decreased knowledge of precautions      OT Treatment/Interventions: Self-care/ADL training;DME and/or AE instruction;Therapeutic activities;Patient/family education(Balance to be addressed in functional context)    OT Goals(Current goals can be found in the care plan section) Acute Rehab OT Goals Patient Stated Goal: Get better and get back home OT Goal Formulation: With patient Time For Goal Achievement: 05/06/18 Potential to Achieve Goals: Good  OT Frequency: Min 2X/week   Barriers to D/C: Other (comment)  Lives alone       Co-evaluation PT/OT/SLP Co-Evaluation/Treatment: Yes Reason for Co-Treatment: For patient/therapist safety;To address functional/ADL transfers;Other (comment)(Activity tolerance) PT goals addressed during session: Mobility/safety with mobility;Balance;Proper use of DME;Strengthening/ROM OT goals addressed during session: ADL's and self-care;Proper use of Adaptive equipment and DME      AM-PAC PT "6 Clicks" Daily Activity     Outcome Measure Help from another person eating meals?: None Help from another person taking care of personal grooming?: A Little Help from another person toileting, which includes using toliet, bedpan, or urinal?: A Lot Help from another person bathing (including washing, rinsing, drying)?: Total Help from another person to put on and taking off  regular upper body clothing?: None Help from another person to put on and taking off regular lower body clothing?: Total 6 Click Score:  15   End of Session Equipment Utilized During Treatment: Gait belt;Rolling walker Nurse Communication: Mobility status  Activity Tolerance: Patient tolerated treatment well Patient left: in chair;with call bell/phone within reach  OT Visit Diagnosis: Unsteadiness on feet (R26.81);Pain Pain - Right/Left: Left Pain - part of body: Hip;Leg                Time: 1610-9604 OT Time Calculation (min): 34 min Charges:  OT General Charges $OT Visit: 1 Visit OT Evaluation $OT Eval Low Complexity: 1 Low OT Treatments $Self Care/Home Management : 8-22 mins   Jennilyn Esteve Beth Dixon, OTR/L 04/22/2018, 10:24 AM

## 2018-04-22 NOTE — Progress Notes (Signed)
Subjective: 1 Day Post-Op Procedure(s) (LRB): OPEN REDUCTION INTERNAL FIXATION (ORIF) PERI PROSTHETIC FEMUR FRACTURE (Left) Patient reports pain as mild.  Feeling ok this am.   Objective: Vital signs in last 24 hours: Temp:  [97.3 F (36.3 C)-98.5 F (36.9 C)] 98.4 F (36.9 C) (11/19 0502) Pulse Rate:  [70-92] 82 (11/19 0502) Resp:  [11-16] 15 (11/18 1931) BP: (107-173)/(43-83) 132/43 (11/19 0502) SpO2:  [91 %-100 %] 99 % (11/19 0502) Weight:  [73 kg] 73 kg (11/18 1258)  Intake/Output from previous day: 11/18 0701 - 11/19 0700 In: 2941 [I.V.:2100; Blood:841] Out: 1140 [Urine:140; Blood:1000] Intake/Output this shift: No intake/output data recorded.  Recent Labs    04/20/18 0348 04/21/18 0344 04/21/18 1718 04/21/18 1815 04/22/18 0211  HGB 11.9* 10.7* 9.9* 10.3* 8.4*   Recent Labs    04/21/18 1815 04/22/18 0211  WBC 23.2* 11.6*  RBC 3.40* 2.90*  HCT 31.8* 26.8*  PLT 190 139*   Recent Labs    04/21/18 0344 04/21/18 1718 04/22/18 0211  NA 139 140 137  K 4.3 4.0 4.1  CL 100  --  101  CO2 34*  --  30  BUN 18  --  12  CREATININE 0.93  --  0.64  GLUCOSE 155* 125* 148*  CALCIUM 9.2  --  8.0*   Recent Labs    04/19/18 1832 04/21/18 0344  INR 2.31 2.07    Neurologically intact Neurovascular intact Sensation intact distally Intact pulses distally Dorsiflexion/Plantar flexion intact Incision: dressing C/D/I No cellulitis present Compartment soft    Assessment/Plan: 1 Day Post-Op Procedure(s) (LRB): OPEN REDUCTION INTERNAL FIXATION (ORIF) PERI PROSTHETIC FEMUR FRACTURE (Left) Up with therapy  NWB LLE hemovac drain in place ABLA- patient asymptomatic but will defer to medicine service    Aundra Dubin 04/22/2018, 8:13 AM

## 2018-04-22 NOTE — [place] FL2 (Signed)
[place] MEDICAID FL2 LEVEL OF CARE SCREENING TOOL     IDENTIFICATION  Patient Name: Teresa Frey Birthdate: 06/30/1929 Sex: female Admission Date (Current Location): 04/19/2018  [facility] and [place] Number:  Herbalist and Address:  [facility], [place] [address] [place], [place] [address]      Provider Number: [id number]  Attending Physician Name and Address:  Lady Deutscher, MD  Relative Name and Phone Number:   Jeannene Patella (daughter) [phone]    Current Level of Care: Hospital Recommended Level of Care: [place] Prior Approval Number:    Date Approved/Denied:   PASRR Number: [id number]  Discharge Plan: SNF    Current Diagnoses: Patient Active Problem List   Diagnosis Date Noted  . Periprosthetic fracture around internal prosthetic left hip joint ([place]) 04/19/2018  . Long term (current) use of anticoagulants [Z79.01] 08/14/2017  . Genetic testing 04/01/2016  . possible sleep apnea by history 02/12/2016  . A-fib ([place]) 02/11/2016  . Surgical wound infection 02/11/2016  . Breast cancer of upper-outer quadrant of right female breast ([place]) 01/11/2016  . Impaired glucose tolerance 07/28/2014  . Anxiety disorder, unspecified 04/07/2007  . Osteoarthritis 04/07/2007  . Essential hypertension 04/02/2007  . History of colonic polyps 04/02/2007  . DIVERTICULITIS, HX OF 04/02/2007    Orientation RESPIRATION BLADDER Height & Weight     Self, Time, Situation, Place  O2(1L/min) Continent, External catheter Weight: 161 lb (73 kg) Height:  5' 2.99" (160 cm)  BEHAVIORAL SYMPTOMS/MOOD NEUROLOGICAL BOWEL NUTRITION STATUS      Continent Diet(see discharge summary)  AMBULATORY STATUS COMMUNICATION OF NEEDS Skin   Extensive Assist Verbally Skin abrasions, Surgical wounds(abrasion left elbow, closed surgical incisions left elbow and left leg)                       Personal Care Assistance Level of Assistance  Bathing,  Feeding, Dressing, Total care Bathing Assistance: Limited assistance Feeding assistance: Independent Dressing Assistance: Limited assistance Total Care Assistance: Maximum assistance   Functional Limitations Info  Sight, Hearing, Speech Sight Info: Adequate Hearing Info: Adequate Speech Info: Adequate    SPECIAL CARE FACTORS FREQUENCY  PT (By licensed PT), OT (By licensed OT)     PT Frequency: min 5x weekly OT Frequency: min 3x weekly            Contractures Contractures Info: Not present    Additional Factors Info  Code Status, Allergies Code Status Info: full Allergies Info: Allergies:  Tramadol           Current Medications (04/22/2018):  This is the current hospital active medication list Current Facility-Administered Medications  Medication Dose Route Frequency Provider Last Rate Last Dose  . 0.9 %  sodium chloride infusion (Manually program via Guardrails IV Fluids)   Intravenous Once Leandrew Koyanagi, MD      . 0.9 %  sodium chloride infusion (Manually program via Guardrails IV Fluids)   Intravenous Once Leandrew Koyanagi, MD      . 0.9 %  sodium chloride infusion  10 mL/hr Intravenous Once Leandrew Koyanagi, MD      . 0.9 %  sodium chloride infusion   Intravenous Continuous Leandrew Koyanagi, MD 75 mL/hr at 04/21/18 2120    . acetaminophen (TYLENOL) tablet 325-650 mg  325-650 mg Oral Q6H PRN Leandrew Koyanagi, MD      . acetaminophen (TYLENOL) tablet 500 mg  500 mg Oral Q6H Xu, Naiping M,  MD   500 mg at 04/22/18 1150  . alum & mag hydroxide-simeth (MAALOX/MYLANTA) 200-200-20 MG/5ML suspension 30 mL  30 mL Oral Q4H PRN Leandrew Koyanagi, MD      . anastrozole (ARIMIDEX) tablet 1 mg  1 mg Oral Daily Leandrew Koyanagi, MD   1 mg at 04/22/18 1035  . calcium-vitamin D (OSCAL WITH D) 500-200 MG-UNIT per tablet 2 tablet  2 tablet Oral Daily Leandrew Koyanagi, MD   2 tablet at 04/22/18 1036  . docusate sodium (COLACE) capsule 100 mg  100 mg Oral BID Leandrew Koyanagi, MD   100 mg at 04/22/18 1035  .  enoxaparin (LOVENOX) injection 40 mg  40 mg Subcutaneous Q24H Leandrew Koyanagi, MD   40 mg at 04/22/18 1035  . fentaNYL (SUBLIMAZE) injection 100 mcg  100 mcg Intravenous Q1H PRN Leandrew Koyanagi, MD   100 mcg at 04/19/18 1925  . hydrochlorothiazide (MICROZIDE) capsule 12.5 mg  12.5 mg Oral Daily Leandrew Koyanagi, MD   12.5 mg at 04/22/18 1035  . HYDROcodone-acetaminophen (NORCO) 7.5-325 MG per tablet 1-2 tablet  1-2 tablet Oral Q4H PRN Leandrew Koyanagi, MD   1 tablet at 04/22/18 0530  . HYDROcodone-acetaminophen (NORCO/VICODIN) 5-325 MG per tablet 1-2 tablet  1-2 tablet Oral Q4H PRN Leandrew Koyanagi, MD   1 tablet at 04/22/18 1036  . magnesium citrate solution 1 Bottle  1 Bottle Oral Once PRN Leandrew Koyanagi, MD      . menthol-cetylpyridinium (CEPACOL) lozenge 3 mg  1 lozenge Oral PRN Leandrew Koyanagi, MD       Or  . phenol (CHLORASEPTIC) mouth spray 1 spray  1 spray Mouth/Throat PRN Leandrew Koyanagi, MD      . methocarbamol (ROBAXIN) tablet 500 mg  500 mg Oral Q6H PRN Leandrew Koyanagi, MD       Or  . methocarbamol (ROBAXIN) 500 mg in dextrose 5 % 50 mL IVPB  500 mg Intravenous Q6H PRN Leandrew Koyanagi, MD      . metoprolol tartrate (LOPRESSOR) tablet 25 mg  25 mg Oral Daily Leandrew Koyanagi, MD   25 mg at 04/21/18 0939  . morphine 2 MG/ML injection 1 mg  1 mg Intravenous Q2H PRN Leandrew Koyanagi, MD      . multivitamin with minerals tablet 1 tablet  1 tablet Oral Daily Leandrew Koyanagi, MD   1 tablet at 04/22/18 1035  . nitroGLYCERIN (NITROSTAT) SL tablet 0.4 mg  0.4 mg Sublingual Q5 min PRN Leandrew Koyanagi, MD   0.4 mg at 04/20/18 0306  . omega-3 acid ethyl esters (LOVAZA) capsule 1 g  1 g Oral Daily Leandrew Koyanagi, MD   1 g at 04/22/18 1036  . ondansetron (ZOFRAN) tablet 4 mg  4 mg Oral Q6H PRN Leandrew Koyanagi, MD       Or  . ondansetron [facility]) injection 4 mg  4 mg Intravenous Q6H PRN Leandrew Koyanagi, MD      . polyethylene glycol (MIRALAX / GLYCOLAX) packet 17 g  17 g Oral Daily PRN Leandrew Koyanagi, MD      . sertraline (ZOLOFT)  tablet 50 mg  50 mg Oral Daily Leandrew Koyanagi, MD   50 mg at 04/22/18 1036  . sorbitol 70 % solution 30 mL  30 mL Oral Daily PRN Leandrew Koyanagi, MD      . tranexamic acid (CYKLOKAPRON) 2,000 mg in sodium chloride 0.9 %  50 mL Topical Application  3,557 mg Topical Once Lady Deutscher, MD      . tranexamic acid (CYKLOKAPRON) 2,000 mg in sodium chloride 0.9 % 50 mL Topical Application  3,220 mg Topical Once Leandrew Koyanagi, MD      . vitamin B-12 (CYANOCOBALAMIN) tablet 1,000 mcg  1,000 mcg Oral Daily Leandrew Koyanagi, MD   1,000 mcg at 04/22/18 1036  . vitamin C (ASCORBIC ACID) tablet 1,000 mg  1,000 mg Oral Daily Leandrew Koyanagi, MD   1,000 mg at 04/22/18 1035  . warfarin (COUMADIN) tablet 15 mg  15 mg Oral ONCE-1800 Mancheril, Darnell Level, RPH      . Warfarin - Pharmacist Dosing Inpatient   Does not apply [id number] Leandrew Koyanagi, MD   Stopped at 04/20/18 1800     Discharge Medications: Please see discharge summary for a list of discharge medications.  Relevant Imaging Results:  Relevant Lab Results:   Additional Information SSN: [id number]  Alberteen Sam, LCSW

## 2018-04-22 NOTE — Care Management Important Message (Signed)
Important Message  Patient Details  Name: Teresa Frey MRN: [id number] Date of Birth: 06-Oct-1929   Medicare Important Message Given:  Yes    Ladarien Beeks 04/22/2018, 2:02 PM

## 2018-04-22 NOTE — Progress Notes (Signed)
ANTICOAGULATION CONSULT NOTE - Follow Up Consult  Pharmacy Consult for coumadin Indication: atrial fibrillation  Allergies  Allergen Reactions  . Tramadol     Loss of control    Patient Measurements: Height: 5' 2.99" (160 cm) Weight: 161 lb (73 kg) IBW/kg (Calculated) : 52.38  Vital Signs: Temp: 99.5 F (37.5 C) (11/19 1036) Temp Source: Oral (11/19 1036) BP: 109/49 (11/19 1122) Pulse Rate: 76 (11/19 1036)  Labs: Recent Labs    04/19/18 1832  04/20/18 0348 04/20/18 1002 04/20/18 1617 04/20/18 2228 04/21/18 0344 04/21/18 1718 04/21/18 1815 04/22/18 0211  HGB 14.0  --  11.9*  --   --   --  10.7* 9.9* 10.3* 8.4*  HCT 43.2  --  38.2  --   --   --  34.4* 29.0* 31.8* 26.8*  PLT 214  --  208  --   --   --  188  --  190 139*  LABPROT 25.1*  --   --   --   --   --  23.1*  --   --   --   INR 2.31  --   --   --   --   --  2.07  --   --   --   CREATININE 0.75  --  0.95  --   --   --  0.93  --   --  0.64  TROPONINI  --    < > 0.03* <0.03 <0.03 <0.03  --   --   --   --    < > = values in this interval not displayed.    Estimated Creatinine Clearance: 48.3 mL/min (by C-G formula based on SCr of 0.64 mg/dL).   Medical History: Past Medical History:  Diagnosis Date  . ANXIETY 04/07/2007  . Breast cancer of upper-outer quadrant of right female breast ([place]) 01/11/2016  . COLONIC POLYPS, HX OF 04/02/2007   adenomatous  . DIVERTICULITIS, HX OF 04/02/2007  . Diverticulosis   . DJD (degenerative joint disease)   . Hemorrhoids   . HYPERTENSION 04/02/2007  . OSTEOARTHRITIS, SEVERE 04/07/2007  . 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 WEIGHT GAIN 11/13/2007    Assessment: Teresa Frey is an 84yo female presenting s/p fall with periprosthetic hip fracture s/p ORIF on 04/21/2018. Pharmacy consulted to resume coumadin for h/o Afib. Of note, patient received a dose of Vit K 10 mg IV x 2 doses on 11/18.  Home warfarin regimen: 15 mg on Tue/Thu; 10 mg on all other days     Goal of Therapy:  INR 2-3 Monitor platelets by anticoagulation protocol: Yes   Plan:  Warfarin 15 mg x 1 dose today  Monitor daily INR, CBC, s/sx of bleeding Stop Lovenox when INR > 2   Thank you for involving pharmacy in this patient's care.  Albertina Parr, PharmD., BCPS Clinical Pharmacist Clinical phone for 04/21/18 until 3:30pm: [phone] If after 3:30pm, please refer to [facility] for unit-specific pharmacist

## 2018-04-22 NOTE — Evaluation (Signed)
Physical Therapy Evaluation Patient Details Name: Teresa Frey MRN: [id number] DOB: 12/26/1929 Today's Date: 04/22/2018   History of Present Illness  Pt is a 84 y/o female s/p ORIF L Periprosthetic femur fracture after a trip and fall at home. PMH includes but is not limited to A-fib, HTN, Breast Ca (remission x5 yrs). Pt lives alone and works full time.  Clinical Impression   Patient is s/p above surgery resulting in functional limitations due to the deficits listed below (see PT Problem List). Independent, working, driving prior to this admission; Presents with pain, decr functional mobility; Recommending SNF fo rpost-acute rehab to maximize independence and safety with mobility prior to going home;  Patient will benefit from skilled PT to increase their independence and safety with mobility to allow discharge to the venue listed below.       Follow Up Recommendations SNF    Equipment Recommendations  Rolling walker with 5" wheels;3in1 (PT)    Recommendations for Other Services       Precautions / Restrictions Precautions Precautions: Fall;Other (comment) Precaution Comments: NWB LLE following ORIF Periprosthetic femur fracture Restrictions Weight Bearing Restrictions: Yes LLE Weight Bearing: Non weight bearing      Mobility  Bed Mobility Overal bed mobility: Needs Assistance Bed Mobility: Supine to Sit     Supine to sit: Mod assist;+2 for physical assistance;+2 for safety/equipment     General bed mobility comments: Assist with LLE, pad and assist to elevate trunk using rails   Transfers Overall transfer level: Needs assistance Equipment used: Rolling walker (2 wheeled) Transfers: Sit to/from [facility] Sit to Stand: Mod assist;+2 physical assistance;+2 safety/equipment;From elevated surface Stand pivot transfers: Mod assist;+2 physical assistance;+2 safety/equipment       General transfer comment: VC's for NWB LLE, pt able to maintain with +2  assist and vc's. Very motivated to return to indpendence.  Ambulation/Gait                Stairs            Wheelchair Mobility    Modified Rankin (Stroke Patients Only)       Balance Overall balance assessment: Needs assistance Sitting-balance support: Single extremity supported;Bilateral upper extremity supported;Feet supported Sitting balance-Leahy Scale: Fair     Standing balance support: Bilateral upper extremity supported Standing balance-Leahy Scale: Poor Standing balance comment: Heavily reliant on bilateral UE's secondary to NWB LLE, +2 Mod A                             Pertinent Vitals/Pain Pain Assessment: Faces Faces Pain Scale: Hurts even more Pain Location: L ankle Pain Descriptors / Indicators: Aching;Operative site guarding;Grimacing Pain Intervention(s): Monitored during session;Premedicated before session;Repositioned    Home Living Family/patient expects to be discharged to:: Private residence Living Arrangements: Alone Available Help at Discharge: Family;Available PRN/intermittently Type of Home: House Home Access: Stairs to enter Entrance Stairs-Rails: None Entrance Stairs-Number of Steps: 1 STE (Back door is ground level and has 1 STE living porch) Home Layout: Two level;Able to live on main level with bedroom/bathroom Home Equipment: Shower seat - built in;Walker - 2 wheels;Cane - single point(Shower seat in walk in shower - pt does not use, prefers tub)      Prior Function Level of Independence: Independent         Comments: 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     Hand Dominance   Dominant Hand: Right  Extremity/Trunk Assessment   Upper Extremity Assessment Upper Extremity Assessment: Defer to OT evaluation    Lower Extremity Assessment Lower Extremity Assessment: LLE deficits/detail LLE Deficits / Details: Grossly decr AROM and strength hip and knee postop;  very hesitant to move with anticipation of pain LLE: Unable to fully assess due to pain    Cervical / Trunk Assessment Cervical / Trunk Assessment: Normal  Communication   Communication: No difficulties  Cognition Arousal/Alertness: Awake/alert Behavior During Therapy: WFL for tasks assessed/performed Overall Cognitive Status: Within Functional Limits for tasks assessed                                 General Comments: Pt is very motivated, lives alone, works full time      Pension scheme manager comments (skin integrity, edema, etc.): Pt highly motivated to return to independent level of function. Agreeable to ST Rehab. Works full time    Exercises     Assessment/Plan    PT Assessment Patient needs continued PT services  PT Problem List Decreased strength;Decreased range of motion;Decreased activity tolerance;Decreased balance;Decreased mobility;Decreased coordination;Decreased knowledge of use of DME;Decreased knowledge of precautions;Pain       PT Treatment Interventions DME instruction;Gait training;Functional mobility training;Therapeutic activities;Therapeutic exercise;Balance training;Patient/family education    PT Goals (Current goals can be found in the Care Plan section)  Acute Rehab PT Goals Patient Stated Goal: Get better and get back home PT Goal Formulation: With patient Time For Goal Achievement: 05/06/18 Potential to Achieve Goals: Good    Frequency Min 3X/week   Barriers to discharge        Co-evaluation PT/OT/SLP Co-Evaluation/Treatment: Yes Reason for Co-Treatment: For patient/therapist safety(activity tolerance) PT goals addressed during session: Mobility/safety with mobility OT goals addressed during session: ADL's and self-care       AM-PAC PT "6 Clicks" Daily Activity  Outcome Measure Difficulty turning over in bed (including adjusting bedclothes, sheets and blankets)?: Unable Difficulty moving from lying on back to sitting  on the side of the bed? : Unable Difficulty sitting down on and standing up from a chair with arms (e.g., wheelchair, bedside commode, etc,.)?: Unable Help needed moving to and from a bed to chair (including a wheelchair)?: A Lot Help needed walking in hospital room?: A Lot Help needed climbing 3-5 steps with a railing? : Total 6 Click Score: 8    End of Session Equipment Utilized During Treatment: Gait belt Activity Tolerance: Patient tolerated treatment well Patient left: in chair;with call bell/phone within reach Nurse Communication: Mobility status PT Visit Diagnosis: Unsteadiness on feet (R26.81);Other abnormalities of gait and mobility (R26.89);Pain Pain - Right/Left: Left Pain - part of body: Hip    Time: 0076-2263 PT Time Calculation (min) (ACUTE ONLY): 31 min   Charges:   PT Evaluation $PT Eval Moderate Complexity: 1 Mod          Roney Marion, PT  Acute Rehabilitation Services Pager [phone] Office [phone]   Colletta [place] 04/22/2018, 1:54 PM

## 2018-04-22 NOTE — Clinical Social Work Note (Signed)
Clinical Social Work Assessment  Patient Details  Name: Teresa Frey MRN: [id number] Date of Birth: [birth year]  Date of referral:  04/22/18               Reason for consult:  Discharge Planning                Permission sought to share information with:  Case Manager, Facility Sport and exercise psychologist, Family Supports Permission granted to share information::  Yes, Verbal Permission Granted  Name::     Pam  Agency::  SNFs  Relationship::  daughter  Contact Information:  [phone]  Housing/Transportation Living arrangements for the past 2 months:  [place] of Information:  Patient Patient Interpreter Needed:  None Criminal Activity/Legal Involvement Pertinent to Current Situation/Hospitalization:  No - Comment as needed Significant Relationships:  Adult Children Lives with:  Self Do you feel safe going back to the place where you live?  No Need for family participation in patient care:  No (Coment)  Care giving concerns:  CSW received referral for possible SNF placement at time of discharge. Spoke with patient regarding possibility of SNF placement . Patient's daughter   is currently unable to care for her at their home given patient's current needs and fall risk.  Patient expressed understanding of PT recommendation and are agreeable to SNF placement at time of discharge. CSW to continue to follow and assist with discharge planning needs.     Social Worker assessment / plan:  Spoke with patient concerning possibility of rehab at SNF before returning home.    Employment status:  Retired Forensic scientist:  Medicare PT Recommendations:  [place] / Referral to community resources:  [place]  Patient/Family's Response to care:  Patient recognizes need for rehab before returning home and are agreeable to a SNF in [place]. They report preference for [facility] as this is close to her daughter   . CSW  explained insurance authorization process. Patient's family reported that they want patient to get stronger to be able to come back home.    Patient/Family's Understanding of and Emotional Response to Diagnosis, Current Treatment, and Prognosis:  Patient/family is realistic regarding therapy needs and expressed being hopeful for SNF placement. Patient expressed understanding of CSW role and discharge process as well as medical condition. No questions/concerns about plan or treatment.    Emotional Assessment Appearance:  Appears stated age Attitude/Demeanor/Rapport:  Self-Confident Affect (typically observed):  Accepting, Adaptable Orientation:  Oriented to Self, Oriented to Place, Oriented to  Time, Oriented to Situation Alcohol / Substance use:  Not Applicable Psych involvement (Current and /or in the community):  No (Comment)  Discharge Needs  Concerns to be addressed:  Discharge Planning Concerns Readmission within the last 30 days:  No Current discharge risk:  Dependent with Mobility Barriers to Discharge:  Continued Medical Work up   [employer], LCSW 04/22/2018, 3:29 PM

## 2018-04-23 DIAGNOSIS — M9702XD Periprosthetic fracture around internal prosthetic left hip joint, subsequent encounter: Secondary | ICD-10-CM

## 2018-04-23 DIAGNOSIS — Z419 Encounter for procedure for purposes other than remedying health state, unspecified: Secondary | ICD-10-CM

## 2018-04-23 LAB — BASIC METABOLIC PANEL
Anion gap: 7 (ref 5–15)
BUN: 13 mg/dL (ref 8–23)
CALCIUM: 8.5 mg/dL — AB (ref 8.9–10.3)
CO2: 29 mmol/L (ref 22–32)
CREATININE: 0.72 mg/dL (ref 0.44–1.00)
Chloride: 99 mmol/L (ref 98–111)
GFR calc Af Amer: >60 mL/min (ref >60)
GFR calc non Af Amer: >60 mL/min (ref >60)
GLUCOSE: 148 mg/dL — AB (ref 70–99)
Potassium: 3.7 mmol/L (ref 3.5–5.1)
Sodium: 135 mmol/L (ref 135–145)

## 2018-04-23 LAB — CBC
HCT: 23 % — ABNORMAL LOW (ref 36.0–46.0)
Hemoglobin: 7.5 g/dL — ABNORMAL LOW (ref 12.0–15.0)
MCH: 30 pg (ref 26.0–34.0)
MCHC: 32.6 g/dL (ref 30.0–36.0)
MCV: 92 fL (ref 80.0–100.0)
Platelets: 152 10*3/uL (ref 150–400)
RBC: 2.5 MIL/uL — ABNORMAL LOW (ref 3.87–5.11)
RDW: 13.5 % (ref 11.5–15.5)
WBC: 13.1 10*3/uL — ABNORMAL HIGH (ref 4.0–10.5)
nRBC: 0 % (ref 0.0–0.2)

## 2018-04-23 LAB — PREPARE RBC (CROSSMATCH)

## 2018-04-23 LAB — PROTIME-INR
INR: 1.34
Prothrombin Time: 16.4 seconds — ABNORMAL HIGH (ref 11.4–15.2)

## 2018-04-23 MED ORDER — WARFARIN SODIUM 7.5 MG PO TABS
15.0000 mg | ORAL_TABLET | Freq: Once | ORAL | Status: AC
  Administered 2018-04-23: 15 mg via ORAL
  Filled 2018-04-23: qty 2

## 2018-04-23 MED ORDER — SODIUM CHLORIDE 0.9% IV SOLUTION
Freq: Once | INTRAVENOUS | Status: AC
  Administered 2018-04-23: 12:00:00 via INTRAVENOUS

## 2018-04-23 NOTE — Progress Notes (Signed)
1 unit PRBC transfusion completed, vital signs taken and recorded, no adverse reactions noted, Pt sitting comfortably on the chair, denies chest pain, no SOB. Will continue to monitor.

## 2018-04-23 NOTE — Progress Notes (Signed)
PROGRESS NOTE    Teresa Frey  KXF:[id number] DOB: August 20, 1929 DOA: 04/19/2018 PCP: [name], [name], MD    Brief Narrative:  84 year old with past medical history relevant for breast cancer in remission on anastrozole, depression/anxiety, chronic atrial fibrillation on warfarin, hypertension, hyperlipidemia admitted with fall and found to have periprosthetic left femur fracture. Her course has been complicated by an episode of chest pain.She was evaluated yesterday for chest pain her troponins were stable and echocardiogram was unremarkable EKG was unchanged.   Assessment & Plan:   Principal Problem:   Periprosthetic fracture around internal prosthetic left hip joint (HCC) Active Problems:   Essential hypertension   Breast cancer of upper-outer quadrant of right female breast (HCC)   A-fib (HCC)   Chest pain resolved Atypical chest pain probably related to stress versus GERD.  Flat troponins EKG unchanged compared to prior EKG. Echocardiogram did not show any wall motion abnormalities.   Left periprosthetic hip fracture Status post open reduction and internal fixation. We started the warfarin. Continue with physical therapy and SNF on discharge.   Chronic atrial fibrillation Rate controlled with metoprolol and anticoagulation restarted.   Anemia of blood loss probably from the surgery Hemoglobin dropped from 10-7.5. Transfuse 1 unit of PRBC today and repeat hemoglobin tomorrow.    Hypertension Well controlled resume with metoprolol and hydrochlorothiazide.   Hyperlipidemia Resume omega fatty acids.   Anxiety/depression Resume sertraline.   DVT prophylaxis: Coumadin Code Status: Full code Family Communication: None at bedside Disposition Plan: To SNF tomorrow if hemoglobin stays stable  Consultants:   Orthopedic surgery with [place]  Procedures: Echocardiogram  ORIF  Subjective: Patient reports feeling exhausted and unable to get out of bed pain  is slightly controlled.  No chest pain or shortness of breath no nausea or vomiting.  Objective: Vitals:   04/23/18 0522 04/23/18 0846 04/23/18 1213 04/23/18 1243  BP: 108/60 112/60 (!) 129/47 (!) 110/49  Pulse: 79 84 88 84  Resp: 18 16 17 18   Temp: (!) 97.5 F (36.4 C) 98.5 F (36.9 C) 98.6 F (37 C) 97.9 F (36.6 C)  TempSrc: Oral Oral Oral Oral  SpO2: 96% 97% 94% 95%  Weight:      Height:        Intake/Output Summary (Last 24 hours) at 04/23/2018 1306 Last data filed at 04/23/2018 0644 Gross per 24 hour  Intake 240 ml  Output 1775 ml  Net -1535 ml   Filed Weights   04/19/18 1829 04/21/18 1258  Weight: 73 kg 73 kg    Examination:  General exam: Appears calm and comfortable  Respiratory system: Clear to auscultation. Respiratory effort normal. Cardiovascular system: S1 & S2 heard, irregular  Gastrointestinal system: Abdomen is nondistended, soft and nontender.Normal bowel sounds heard. Central nervous system: Alert and oriented. No focal neurological deficits. Extremities: Symmetric 5 x 5 power. Skin: No rashes, lesions or ulcers Psychiatry:Mood & affect appropriate.     Data Reviewed: I have personally reviewed following labs and imaging studies  CBC: Recent Labs  Lab 04/18/18 1027  04/19/18 1832 04/20/18 0348 04/21/18 0344 04/21/18 1718 04/21/18 1815 04/22/18 0211 04/23/18 0326  WBC 8.9   < > 18.9* 13.8* 12.4*  --  23.2* 11.6* 13.1*  NEUTROABS 5.1  --  15.4*  --   --   --   --   --   --   HGB 14.6   < > 14.0 11.9* 10.7* 9.9* 10.3* 8.4* 7.5*  HCT 45.7  --  43.2 38.2 34.4*  29.0* 31.8* 26.8* 23.0*  MCV 92.9  --  92.1 92.5 94.0  --  93.5 92.4 92.0  PLT 169   < > 214 208 188  --  190 139* 152   < > = values in this interval not displayed.   Basic Metabolic Panel: Recent Labs  Lab 04/19/18 1832 04/20/18 0348 04/21/18 0344 04/21/18 1718 04/22/18 0211 04/23/18 0326  NA 139 139 139 140 137 135  K 3.7 3.8 4.3 4.0 4.1 3.7  CL 106 102 100  --  101 99   CO2 25 26 34*  --  30 29  GLUCOSE 180* 185* 155* 125* 148* 148*  BUN 17 21 18   --  12 13  CREATININE 0.75 0.95 0.93  --  0.64 0.72  CALCIUM 9.3 9.1 9.2  --  8.0* 8.5*   GFR: Estimated Creatinine Clearance: 48.3 mL/min (by C-G formula based on SCr of 0.72 mg/dL). Liver Function Tests: Recent Labs  Lab 04/18/18 1027 04/19/18 1832  AST 16 19  ALT 14 16  ALKPHOS 186* 149*  BILITOT 0.4 0.7  PROT 6.8 6.6  ALBUMIN 3.8 3.8   No results for input(s): LIPASE, AMYLASE in the last 168 hours. No results for input(s): AMMONIA in the last 168 hours. Coagulation Profile: Recent Labs  Lab 04/19/18 1832 04/21/18 0344 04/23/18 0326  INR 2.31 2.07 1.34   Cardiac Enzymes: Recent Labs  Lab 04/20/18 0348 04/20/18 1002 04/20/18 1617 04/20/18 2228  TROPONINI 0.03* <0.03 <0.03 <0.03   BNP (last 3 results) No results for input(s): PROBNP in the last 8760 hours. HbA1C: No results for input(s): HGBA1C in the last 72 hours. CBG: No results for input(s): GLUCAP in the last 168 hours. Lipid Profile: No results for input(s): CHOL, HDL, LDLCALC, TRIG, CHOLHDL, LDLDIRECT in the last 72 hours. Thyroid Function Tests: No results for input(s): TSH, T4TOTAL, FREET4, T3FREE, THYROIDAB in the last 72 hours. Anemia Panel: No results for input(s): VITAMINB12, FOLATE, FERRITIN, TIBC, IRON, RETICCTPCT in the last 72 hours. Sepsis Labs: No results for input(s): PROCALCITON, LATICACIDVEN in the last 168 hours.  Recent Results (from the past 240 hour(s))  MRSA PCR Screening     Status: None   Collection Time: 04/19/18 11:57 PM  Result Value Ref Range Status   MRSA by PCR NEGATIVE NEGATIVE Final    Comment:        The GeneXpert MRSA Assay (FDA approved for NASAL specimens only), is one component of a comprehensive MRSA colonization surveillance program. It is not intended to diagnose MRSA infection nor to guide or monitor treatment for MRSA infections. Performed at [facility], [place] [address] [place], [place] [address]          Radiology Studies: Dg C-arm 1-60 Min  Result Date: 04/21/2018 CLINICAL DATA:  Open reduction and internal fixation of left femur fracture. EXAM: DG C-ARM 61-120 MIN; LEFT FEMUR 2 VIEWS FLUOROSCOPY TIME:  2 minutes 7 seconds. COMPARISON:  Radiographs of April 19, 2018. FINDINGS: Five intraoperative fluoroscopic images of the left femur demonstrate the patient be status post open reduction and surgical internal fixation of left periprosthetic fracture. Good alignment of fracture components is noted. IMPRESSION: Status post open reduction and internal fixation of left femoral periprosthetic fracture. Electronically Signed   By: Marijo Conception, M.D.   On: 04/21/2018 16:46   Dg C-arm 1-60 Min  Result Date: 04/21/2018 CLINICAL DATA:  Open reduction and internal fixation of left femur fracture. EXAM: DG C-ARM 61-120 MIN;  LEFT FEMUR 2 VIEWS FLUOROSCOPY TIME:  2 minutes 7 seconds. COMPARISON:  Radiographs of April 19, 2018. FINDINGS: Five intraoperative fluoroscopic images of the left femur demonstrate the patient be status post open reduction and surgical internal fixation of left periprosthetic fracture. Good alignment of fracture components is noted. IMPRESSION: Status post open reduction and internal fixation of left femoral periprosthetic fracture. Electronically Signed   By: Marijo Conception, M.D.   On: 04/21/2018 16:46   Dg Femur Min 2 Views Left  Result Date: 04/21/2018 CLINICAL DATA:  Open reduction and internal fixation of left femur fracture. EXAM: DG C-ARM 61-120 MIN; LEFT FEMUR 2 VIEWS FLUOROSCOPY TIME:  2 minutes 7 seconds. COMPARISON:  Radiographs of April 19, 2018. FINDINGS: Five intraoperative fluoroscopic images of the left femur demonstrate the patient be status post open reduction and surgical internal fixation of left periprosthetic fracture. Good alignment of fracture components is noted. IMPRESSION: Status post open reduction and  internal fixation of left femoral periprosthetic fracture. Electronically Signed   By: Marijo Conception, M.D.   On: 04/21/2018 16:46   Dg Femur Port Min 2 Views Left  Result Date: 04/21/2018 CLINICAL DATA:  ORIF of left femur. EXAM: LEFT FEMUR PORTABLE 2 VIEWS COMPARISON:  Intraoperative films of earlier today. FINDINGS: 1821 hours. Left hip arthroplasty. Lateral plate and screw/cerclage wire fixation of the mid to distal femoral shaft. Comminuted fracture of the mid femoral shaft again identified. Alignment is nearly anatomic. Gas within the suprapatellar bursa of the knee. IMPRESSION: Postop for placement of an internal fixation device across the previously described comminuted femoral fracture. Electronically Signed   By: Abigail Miyamoto M.D.   On: 04/21/2018 20:49        Scheduled Meds: . sodium chloride   Intravenous Once  . sodium chloride   Intravenous Once  . anastrozole  1 mg Oral Daily  . calcium-vitamin D  2 tablet Oral Daily  . docusate sodium  100 mg Oral BID  . enoxaparin (LOVENOX) injection  40 mg Subcutaneous Q24H  . hydrochlorothiazide  12.5 mg Oral Daily  . metoprolol tartrate  25 mg Oral Daily  . multivitamin with minerals  1 tablet Oral Daily  . omega-3 acid ethyl esters  1 g Oral Daily  . sertraline  50 mg Oral Daily  . tranexamic acid (CYKLOKAPRON) topical -INTRAOP  2,000 mg Topical Once  . tranexamic acid (CYKLOKAPRON) topical -INTRAOP  2,000 mg Topical Once  . vitamin B-12  1,000 mcg Oral Daily  . vitamin C  1,000 mg Oral Daily  . warfarin  15 mg Oral ONCE-1800  . Warfarin - Pharmacist Dosing Inpatient   Does not apply q1800   Continuous Infusions: . sodium chloride    . sodium chloride 75 mL/hr at 04/21/18 2120  . methocarbamol (ROBAXIN) IV       LOS: 4 days    Time spent: 35 minutes.     Hosie Poisson, MD [facility] Pager [phone] If 7PM-7AM, please contact night-coverage [email or web address] Password [email or web address] 04/23/2018, 1:06 PM

## 2018-04-23 NOTE — Progress Notes (Signed)
Started 1 unit PRBC transfusion, vital signs taken and recorded. Pt denies chest pain, no SOB, no adverse reactions noted, will continue to monitor.

## 2018-04-23 NOTE — Progress Notes (Signed)
ANTICOAGULATION CONSULT NOTE - Follow Up Consult  Pharmacy Consult for coumadin Indication: atrial fibrillation  Allergies  Allergen Reactions  . Tramadol     Loss of control    Patient Measurements: Height: 5' 2.99" (160 cm) Weight: 161 lb (73 kg) IBW/kg (Calculated) : 52.38  Vital Signs: Temp: 98.5 F (36.9 C) (11/20 0846) Temp Source: Oral (11/20 0846) BP: 112/60 (11/20 0846) Pulse Rate: 84 (11/20 0846)  Labs: Recent Labs    04/20/18 1002 04/20/18 1617 04/20/18 2228  04/21/18 0344  04/21/18 1815 04/22/18 0211 04/23/18 0326  HGB  --   --   --   --  10.7*   < > 10.3* 8.4* 7.5*  HCT  --   --   --   --  34.4*   < > 31.8* 26.8* 23.0*  PLT  --   --   --    < > 188  --  190 139* 152  LABPROT  --   --   --   --  23.1*  --   --   --  16.4*  INR  --   --   --   --  2.07  --   --   --  1.34  CREATININE  --   --   --   --  0.93  --   --  0.64 0.72  TROPONINI <0.03 <0.03 <0.03  --   --   --   --   --   --    < > = values in this interval not displayed.    Estimated Creatinine Clearance: 48.3 mL/min (by C-G formula based on SCr of 0.72 mg/dL).   Medical History: Past Medical History:  Diagnosis Date  . ANXIETY 04/07/2007  . Breast cancer of upper-outer quadrant of right female breast ([place]) 01/11/2016  . COLONIC POLYPS, HX OF 04/02/2007   adenomatous  . DIVERTICULITIS, HX OF 04/02/2007  . Diverticulosis   . DJD (degenerative joint disease)   . Hemorrhoids   . HYPERTENSION 04/02/2007  . OSTEOARTHRITIS, SEVERE 04/07/2007  . 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 WEIGHT GAIN 11/13/2007    Assessment: Teresa Frey is an 84yo female presenting s/p fall with periprosthetic hip fracture s/p ORIF on 04/21/2018. Pharmacy consulted to resume coumadin for h/o Afib. Of note, patient received a dose of Vit K 10 mg IV x 2 doses on 11/18.  INR today is 1.34 after 1 dose of warfarin. H/H has dropped significantly. Hemovac drain was pulled. No overt s/s of bleeding per RN.  Ortho deferring decision to transfuse to medicine team  Home warfarin regimen: 15 mg on Tue/Thu; 10 mg on all other days    Goal of Therapy:  INR 2-3 Monitor platelets by anticoagulation protocol: Yes   Plan:  Repeat Warfarin 15 mg x 1 dose today  Monitor daily INR, CBC, s/sx of bleeding Stop Lovenox when INR > 2   Thank you for involving pharmacy in this patient's care.  Albertina Parr, PharmD., BCPS Clinical Pharmacist Clinical phone for 04/23/18 until 3:30pm: [phone] If after 3:30pm, please refer to [facility] for unit-specific pharmacist

## 2018-04-23 NOTE — Progress Notes (Signed)
Physical Therapy Treatment Patient Details Name: Teresa Frey MRN: [id number] DOB: 05/31/1930 Today's Date: 04/23/2018    History of Present Illness Pt is a 84 y/o female s/p ORIF L Periprosthetic femur fracture after a trip and fall at home. PMH includes but is not limited to A-fib, HTN, Breast Ca (remission x5 yrs). Pt lives alone and works full time.    PT Comments    Continuing work on functional mobility and activity tolerance;  Noting improved ability to move LLE for therex; Decr activity tolerance with noted decr Hgb, about to receive blood at end of our session; continue to recommend post-acute rehab to maximize independence and safety with mobility prior to dc home  Follow Up Recommendations  SNF     Equipment Recommendations  Rolling walker with 5" wheels;3in1 (PT)    Recommendations for Other Services       Precautions / Restrictions Precautions Precautions: Fall;Other (comment) Precaution Comments: NWB LLE following ORIF Periprosthetic femur fracture Restrictions LLE Weight Bearing: Non weight bearing    Mobility  Bed Mobility Overal bed mobility: Needs Assistance Bed Mobility: Supine to Sit     Supine to sit: Min assist     General bed mobility comments: Assist with LLE, pad and assist to elevate trunk using rails   Transfers Overall transfer level: Needs assistance Equipment used: Rolling walker (2 wheeled) Transfers: Sit to/from [facility] Sit to Stand: Max assist;+2 physical assistance Stand pivot transfers: Max assist;+2 physical assistance       General transfer comment: Max assist of 2 to stand and pivot OOB to recliner towards pt's R side; more tired today and needing more assist than yesterday with transfer  Ambulation/Gait                 Stairs             Wheelchair Mobility    Modified Rankin (Stroke Patients Only)       Balance Overall balance assessment: Needs assistance   Sitting  balance-Leahy Scale: Fair       Standing balance-Leahy Scale: Poor Standing balance comment: Heavily reliant on bilateral UE's secondary to NWB LLE, +2 Mod A                            Cognition Arousal/Alertness: Awake/alert Behavior During Therapy: WFL for tasks assessed/performed Overall Cognitive Status: Within Functional Limits for tasks assessed                                 General Comments: Pt is very motivated, lives alone, works full time      Therapist, nutritional Sets: AROM;Left;10 reps(weak, but quad activation present) Towel Squeeze: AROM;Right;Left;10 reps Heel Slides: AROM;Left;10 reps(range limited by pain) Hip ABduction/ADduction: AROM;Left;10 reps    General Comments        Pertinent Vitals/Pain Pain Assessment: Faces Faces Pain Scale: Hurts even more Pain Location: L ankle Pain Descriptors / Indicators: Aching;Operative site guarding;Grimacing Pain Intervention(s): Monitored during session;Repositioned    Home Living                      Prior Function            PT Goals (current goals can now be found in the care plan section) Acute Rehab PT Goals Patient Stated Goal: Get better and get back home PT  Goal Formulation: With patient Time For Goal Achievement: 05/06/18 Potential to Achieve Goals: Good Progress towards PT goals: Progressing toward goals(Slowly)    Frequency    Min 3X/week      PT Plan Current plan remains appropriate    Co-evaluation              AM-PAC PT "6 Clicks" Daily Activity  Outcome Measure  Difficulty turning over in bed (including adjusting bedclothes, sheets and blankets)?: A Lot Difficulty moving from lying on back to sitting on the side of the bed? : A Little Difficulty sitting down on and standing up from a chair with arms (e.g., wheelchair, bedside commode, etc,.)?: Unable Help needed moving to and from a bed to chair (including a wheelchair)?: A  Lot Help needed walking in hospital room?: Total Help needed climbing 3-5 steps with a railing? : Total 6 Click Score: 10    End of Session Equipment Utilized During Treatment: Gait belt Activity Tolerance: Patient limited by fatigue Patient left: in chair;with call bell/phone within reach Nurse Communication: Mobility status PT Visit Diagnosis: Unsteadiness on feet (R26.81);Other abnormalities of gait and mobility (R26.89);Pain Pain - Right/Left: Left Pain - part of body: Hip     Time: 1152-1212 PT Time Calculation (min) (ACUTE ONLY): 20 min  Charges:  $Therapeutic Exercise: 8-22 mins                     Roney Marion, PT  Acute Rehabilitation Services Pager [phone] Office [place] 04/23/2018, 3:41 PM

## 2018-04-23 NOTE — Progress Notes (Signed)
Subjective: 2 Days Post-Op Procedure(s) (LRB): OPEN REDUCTION INTERNAL FIXATION (ORIF) PERI PROSTHETIC FEMUR FRACTURE (Left) Patient reports pain as mild.  Patient tired and weak.  Otherwise, feeling ok.   Objective: Vital signs in last 24 hours: Temp:  [97.5 F (36.4 C)-99.5 F (37.5 C)] 97.5 F (36.4 C) (11/20 0522) Pulse Rate:  [74-114] 79 (11/20 0522) Resp:  [14-24] 18 (11/20 0522) BP: (108-123)/(48-96) 108/60 (11/20 0522) SpO2:  [78 %-98 %] 96 % (11/20 0522)  Intake/Output from previous day: 11/19 0701 - 11/20 0700 In: 600 [P.O.:600] Out: 1775 [Urine:1700; Drains:75] Intake/Output this shift: No intake/output data recorded.  Recent Labs    04/21/18 0344 04/21/18 1718 04/21/18 1815 04/22/18 0211 04/23/18 0326  HGB 10.7* 9.9* 10.3* 8.4* 7.5*   Recent Labs    04/22/18 0211 04/23/18 0326  WBC 11.6* 13.1*  RBC 2.90* 2.50*  HCT 26.8* 23.0*  PLT 139* 152   Recent Labs    04/22/18 0211 04/23/18 0326  NA 137 135  K 4.1 3.7  CL 101 99  CO2 30 29  BUN 12 13  CREATININE 0.64 0.72  GLUCOSE 148* 148*  CALCIUM 8.0* 8.5*   Recent Labs    04/21/18 0344 04/23/18 0326  INR 2.07 1.34    Neurologically intact  Bandage c/d/i Calf soft and NT Ehl/fhl intact    Assessment/Plan: 2 Days Post-Op Procedure(s) (LRB): OPEN REDUCTION INTERNAL FIXATION (ORIF) PERI PROSTHETIC FEMUR FRACTURE (Left) Up with therapy NWB LLE hemovac drain pulled by me today ABLA- trending down. Will defer transfusion to medicine team F/u with Dr. Erlinda Hong 2 weeks post-op for suture removal   Aundra Dubin 04/23/2018, 7:51 AM

## 2018-04-23 NOTE — Plan of Care (Signed)

## 2018-04-24 DIAGNOSIS — F339 Major depressive disorder, recurrent, unspecified: Secondary | ICD-10-CM | POA: Diagnosis not present

## 2018-04-24 DIAGNOSIS — M6281 Muscle weakness (generalized): Secondary | ICD-10-CM | POA: Diagnosis not present

## 2018-04-24 DIAGNOSIS — W19XXXA Unspecified fall, initial encounter: Secondary | ICD-10-CM | POA: Diagnosis not present

## 2018-04-24 DIAGNOSIS — M1990 Unspecified osteoarthritis, unspecified site: Secondary | ICD-10-CM | POA: Diagnosis not present

## 2018-04-24 DIAGNOSIS — R0989 Other specified symptoms and signs involving the circulatory and respiratory systems: Secondary | ICD-10-CM | POA: Diagnosis not present

## 2018-04-24 DIAGNOSIS — J690 Pneumonitis due to inhalation of food and vomit: Secondary | ICD-10-CM | POA: Diagnosis not present

## 2018-04-24 DIAGNOSIS — Z7401 Bed confinement status: Secondary | ICD-10-CM | POA: Diagnosis not present

## 2018-04-24 DIAGNOSIS — Z9889 Other specified postprocedural states: Secondary | ICD-10-CM | POA: Diagnosis not present

## 2018-04-24 DIAGNOSIS — I4891 Unspecified atrial fibrillation: Secondary | ICD-10-CM | POA: Diagnosis not present

## 2018-04-24 DIAGNOSIS — Z86010 Personal history of colonic polyps: Secondary | ICD-10-CM | POA: Diagnosis not present

## 2018-04-24 DIAGNOSIS — R262 Difficulty in walking, not elsewhere classified: Secondary | ICD-10-CM | POA: Diagnosis not present

## 2018-04-24 DIAGNOSIS — I482 Chronic atrial fibrillation, unspecified: Secondary | ICD-10-CM | POA: Diagnosis not present

## 2018-04-24 DIAGNOSIS — R52 Pain, unspecified: Secondary | ICD-10-CM | POA: Diagnosis not present

## 2018-04-24 DIAGNOSIS — R509 Fever, unspecified: Secondary | ICD-10-CM | POA: Diagnosis not present

## 2018-04-24 DIAGNOSIS — C50411 Malignant neoplasm of upper-outer quadrant of right female breast: Secondary | ICD-10-CM | POA: Diagnosis not present

## 2018-04-24 DIAGNOSIS — D72829 Elevated white blood cell count, unspecified: Secondary | ICD-10-CM | POA: Diagnosis not present

## 2018-04-24 DIAGNOSIS — R05 Cough: Secondary | ICD-10-CM | POA: Diagnosis not present

## 2018-04-24 DIAGNOSIS — L299 Pruritus, unspecified: Secondary | ICD-10-CM | POA: Diagnosis not present

## 2018-04-24 DIAGNOSIS — R21 Rash and other nonspecific skin eruption: Secondary | ICD-10-CM | POA: Diagnosis not present

## 2018-04-24 DIAGNOSIS — S161XXD Strain of muscle, fascia and tendon at neck level, subsequent encounter: Secondary | ICD-10-CM | POA: Diagnosis not present

## 2018-04-24 DIAGNOSIS — R5381 Other malaise: Secondary | ICD-10-CM | POA: Diagnosis not present

## 2018-04-24 DIAGNOSIS — Z7901 Long term (current) use of anticoagulants: Secondary | ICD-10-CM | POA: Diagnosis not present

## 2018-04-24 DIAGNOSIS — Z471 Aftercare following joint replacement surgery: Secondary | ICD-10-CM | POA: Diagnosis not present

## 2018-04-24 DIAGNOSIS — R062 Wheezing: Secondary | ICD-10-CM | POA: Diagnosis not present

## 2018-04-24 DIAGNOSIS — M2550 Pain in unspecified joint: Secondary | ICD-10-CM | POA: Diagnosis not present

## 2018-04-24 DIAGNOSIS — S0990XD Unspecified injury of head, subsequent encounter: Secondary | ICD-10-CM | POA: Diagnosis not present

## 2018-04-24 DIAGNOSIS — M9702XD Periprosthetic fracture around internal prosthetic left hip joint, subsequent encounter: Secondary | ICD-10-CM | POA: Diagnosis not present

## 2018-04-24 DIAGNOSIS — M79605 Pain in left leg: Secondary | ICD-10-CM | POA: Diagnosis not present

## 2018-04-24 DIAGNOSIS — E785 Hyperlipidemia, unspecified: Secondary | ICD-10-CM | POA: Diagnosis not present

## 2018-04-24 DIAGNOSIS — R7302 Impaired glucose tolerance (oral): Secondary | ICD-10-CM | POA: Diagnosis not present

## 2018-04-24 DIAGNOSIS — I959 Hypotension, unspecified: Secondary | ICD-10-CM | POA: Diagnosis not present

## 2018-04-24 DIAGNOSIS — F419 Anxiety disorder, unspecified: Secondary | ICD-10-CM | POA: Diagnosis not present

## 2018-04-24 DIAGNOSIS — R531 Weakness: Secondary | ICD-10-CM | POA: Diagnosis not present

## 2018-04-24 DIAGNOSIS — R0902 Hypoxemia: Secondary | ICD-10-CM | POA: Diagnosis not present

## 2018-04-24 DIAGNOSIS — I10 Essential (primary) hypertension: Secondary | ICD-10-CM | POA: Diagnosis not present

## 2018-04-24 DIAGNOSIS — J189 Pneumonia, unspecified organism: Secondary | ICD-10-CM | POA: Diagnosis not present

## 2018-04-24 DIAGNOSIS — Z419 Encounter for procedure for purposes other than remedying health state, unspecified: Secondary | ICD-10-CM | POA: Diagnosis not present

## 2018-04-24 DIAGNOSIS — W19XXXD Unspecified fall, subsequent encounter: Secondary | ICD-10-CM | POA: Diagnosis not present

## 2018-04-24 DIAGNOSIS — D500 Iron deficiency anemia secondary to blood loss (chronic): Secondary | ICD-10-CM | POA: Diagnosis not present

## 2018-04-24 DIAGNOSIS — I480 Paroxysmal atrial fibrillation: Secondary | ICD-10-CM | POA: Diagnosis not present

## 2018-04-24 LAB — PROTIME-INR
INR: 1.2
PROTHROMBIN TIME: 15.1 s (ref 11.4–15.2)

## 2018-04-24 LAB — CBC
HCT: 27.1 % — ABNORMAL LOW (ref 36.0–46.0)
HEMOGLOBIN: 8.6 g/dL — AB (ref 12.0–15.0)
MCH: 29.8 pg (ref 26.0–34.0)
MCHC: 31.7 g/dL (ref 30.0–36.0)
MCV: 93.8 fL (ref 80.0–100.0)
NRBC: 0.3 % — AB (ref 0.0–0.2)
Platelets: 209 10*3/uL (ref 150–400)
RBC: 2.89 MIL/uL — AB (ref 3.87–5.11)
RDW: 13.2 % (ref 11.5–15.5)
WBC: 14.1 10*3/uL — AB (ref 4.0–10.5)

## 2018-04-24 LAB — TYPE AND SCREEN
ABO/RH(D): O POS
Antibody Screen: NEGATIVE
UNIT DIVISION: 0
Unit division: 0

## 2018-04-24 LAB — BPAM RBC
BLOOD PRODUCT EXPIRATION DATE: 201912162359
Blood Product Expiration Date: 201912162359
ISSUE DATE / TIME: 201911181547
ISSUE DATE / TIME: 201911201205
UNIT TYPE AND RH: 5100
Unit Type and Rh: 5100

## 2018-04-24 MED ORDER — SENNOSIDES-DOCUSATE SODIUM 8.6-50 MG PO TABS: 2.0000 | ORAL_TABLET | Freq: Two times a day (BID) | ORAL | Status: DC

## 2018-04-24 MED ORDER — BISACODYL 10 MG RE SUPP
10.0000 mg | Freq: Every day | RECTAL | Status: DC | PRN
  Administered 2018-04-24: 10 mg via RECTAL
  Filled 2018-04-24: qty 1

## 2018-04-24 MED ORDER — DOCUSATE SODIUM 100 MG PO CAPS: 100.0000 mg | ORAL_CAPSULE | Freq: Two times a day (BID) | ORAL | 0 refills | Status: DC

## 2018-04-24 MED ORDER — POLYETHYLENE GLYCOL 3350 17 G PO PACK: 17.0000 g | PACK | Freq: Every day | ORAL | 0 refills | Status: DC | PRN

## 2018-04-24 MED ORDER — ACETAMINOPHEN 325 MG PO TABS: 325.0000 mg | ORAL_TABLET | Freq: Four times a day (QID) | ORAL | Status: DC | PRN

## 2018-04-24 NOTE — Progress Notes (Signed)
Report was given to [facility] at [facility]. A copy of discharge instructions was given to pt. Pt was discharged to [facility] via [facility].

## 2018-04-24 NOTE — Progress Notes (Signed)
Patient will DC to: [facility] Anticipated DC date: 04/24/18 Family notified: [name] Transport by: Corey Harold  Per MD patient ready for DC to [facility] . RN, patient, patient's family, and facility notified of DC. Discharge Summary sent to facility. RN given number for report [phone]. DC packet on chart. Ambulance transport requested for patient.  CSW signing off.  [place], [place]

## 2018-04-24 NOTE — Discharge Summary (Signed)
Physician Discharge Summary  Teresa Frey NLG:[id number] DOB: 14-Jul-1929 DOA: 04/19/2018  PCP: [name], [name], MD  Admit date: 04/19/2018 Discharge date: 04/24/2018  Admitted From: Home.  Disposition:  SNF  Recommendations for Outpatient Follow-up:  1. Follow up with PCP in 1-2 weeks 2. Please obtain BMP/CBC in one week Please follow up with orthopedics as recommended.   Discharge Condition:STABLE.  CODE STATUS:full code.  Diet recommendation: Heart Healthy   Brief/Interim Summary: 84 year old with past medical history relevant for breast cancer in remission on anastrozole, depression/anxiety, chronic atrial fibrillation on warfarin, hypertension, hyperlipidemia admitted with fall and found to have periprosthetic left femur fracture. Her course has been complicated by an episode of chest pain.She was evaluated yesterday for chest pain her troponins were stable and echocardiogram was unremarkable EKG was unchanged.  Discharge Diagnoses:  Principal Problem:   Periprosthetic fracture around internal prosthetic left hip joint (HCC) Active Problems:   Essential hypertension   Breast cancer of upper-outer quadrant of right female breast (HCC)   A-fib (HCC)  Chest pain resolved Atypical chest pain probably related to stress versus GERD.  Flat troponins EKG unchanged compared to prior EKG. Echocardiogram did not show any wall motion abnormalities.   Left periprosthetic hip fracture Status post open reduction and internal fixation. We started the warfarin. Continue with physical therapy and SNF on discharge.   Chronic atrial fibrillation Rate controlled with metoprolol and anticoagulation restarted. Coumadin restarted.    Anemia of blood loss probably from the surgery Hemoglobin dropped from 10-7.5. Underwent 1 unit of prbc transfusion and repeat H&H is greater than 8.5.     Hypertension Well controlled resume with metoprolol and  hydrochlorothiazide.   Hyperlipidemia Resume omega fatty acids.   Anxiety/depression Resume sertraline.   Discharge Instructions  Discharge Instructions    Diet - low sodium heart healthy   Complete by:  As directed    Discharge instructions   Complete by:  As directed    Please follow up with orthopedics as recommended.   Non weight bearing   Complete by:  As directed      Allergies as of 04/24/2018      Reactions   Tramadol    Loss of control      Medication List    TAKE these medications   acetaminophen 325 MG tablet Commonly known as:  TYLENOL Take 1-2 tablets (325-650 mg total) by mouth every 6 (six) hours as needed for mild pain (pain score 1-3 or temp > 100.5).   albuterol 108 (90 Base) MCG/ACT inhaler Commonly known as:  PROVENTIL HFA;VENTOLIN HFA Inhale 2 puffs into the lungs every 6 (six) hours as needed for wheezing or shortness of breath.   anastrozole 1 MG tablet Commonly known as:  ARIMIDEX Take 1 tablet (1 mg total) by mouth daily.   BIOTIN 5000 PO Take 1 tablet by mouth daily.   CALCIUM 1000 + D PO Take 1 tablet by mouth daily.   calcium-vitamin D 500-200 MG-UNIT tablet Commonly known as:  OSCAL WITH D Take 1 tablet by mouth 3 (three) times daily.   CENTRUM SILVER ADULT 50+ Tabs Take 1 tablet by mouth daily.   docusate sodium 100 MG capsule Commonly known as:  COLACE Take 1 capsule (100 mg total) by mouth 2 (two) times daily.   hydrochlorothiazide 12.5 MG capsule Commonly known as:  MICROZIDE TAKE 1 CAPSULE BY MOUTH DAILY.   metoprolol tartrate 25 MG tablet Commonly known as:  LOPRESSOR Take 1 tablet (25 mg total)  by mouth daily. TAKE 1 TABLET BY MOUTH DAILY   omega-3 acid ethyl esters 1 g capsule Commonly known as:  LOVAZA Take 1 g by mouth daily.   oxyCODONE-acetaminophen 5-325 MG tablet Commonly known as:  PERCOCET/ROXICET Take 1-2 tablets by mouth every 4 (four) hours as needed for severe pain.   polyethylene glycol  packet Commonly known as:  MIRALAX / GLYCOLAX Take 17 g by mouth daily as needed for mild constipation.   sertraline 50 MG tablet Commonly known as:  ZOLOFT Take 1 tablet (50 mg total) by mouth daily.   vitamin B-12 1000 MCG tablet Commonly known as:  CYANOCOBALAMIN Take 1,000 mcg by mouth daily.   vitamin C 1000 MG tablet Take 1,000 mg by mouth daily.   warfarin 5 MG tablet Commonly known as:  COUMADIN Take as directed. If you are unsure how to take this medication, talk to your nurse or doctor. Original instructions:  Take 15mg  (3 tablets) on Tuesdays and Thursdays, take 10 mg (2 tablets) all other days of the week or as directed by coumadin clinic What changed:    how much to take  how to take this  when to take this  additional instructions   zinc sulfate 220 (50 Zn) MG capsule Take 1 capsule (220 mg total) by mouth daily.            Discharge Care Instructions  (From admission, onward)         Start     Ordered   04/21/18 0000  Non weight bearing     04/21/18 1715          Contact information for follow-up providers    Leandrew Koyanagi, MD In 2 weeks.   Specialty:  Orthopedic Surgery Why:  For suture removal, For wound re-check Contact information: [place] [address] [phone]        [name], [name], MD. Schedule an appointment as soon as possible for a visit in 1 week(s).   Specialty:  Family Medicine Contact information: [place] [address] [phone]            Contact information for after-discharge care    Destination    [facility] Preferred SNF .   Service:  Skilled Nursing Contact information: [address] [address] [phone]                 Allergies  Allergen Reactions  . Tramadol     Loss of control    Consultations: Orthopedics   Procedures/Studies: Dg Pelvis Portable  Result Date:  04/19/2018 CLINICAL DATA:  Fall with left leg pain EXAM: PORTABLE PELVIS 1-2 VIEWS COMPARISON:  03/04/2017 FINDINGS: Coarse calcification in the pelvis may reflect fibroid. Status post bilateral hip replacement with normal alignment. Pubic symphysis and rami appear intact. IMPRESSION: Status post bilateral hip replacement. No definite acute osseous abnormality. Electronically Signed   By: Donavan Foil M.D.   On: 04/19/2018 19:28   Dg Chest Portable 1 View  Result Date: 04/19/2018 CLINICAL DATA:  Elevated WBC, shortness of breath EXAM: PORTABLE CHEST 1 VIEW COMPARISON:  06/28/2017 FINDINGS: There is no focal consolidation. There is chronic bilateral interstitial thickening. The lungs are hyperinflated likely secondary to COPD. There is no pleural effusion or pneumothorax. There is stable cardiomegaly. The osseous structures are unremarkable. IMPRESSION: No acute cardiopulmonary disease. Electronically Signed   By: Kathreen Devoid   On: 04/19/2018 21:22   Dg C-arm  1-60 Min  Result Date: 04/21/2018 CLINICAL DATA:  Open reduction and internal fixation of left femur fracture. EXAM: DG C-ARM 61-120 MIN; LEFT FEMUR 2 VIEWS FLUOROSCOPY TIME:  2 minutes 7 seconds. COMPARISON:  Radiographs of April 19, 2018. FINDINGS: Five intraoperative fluoroscopic images of the left femur demonstrate the patient be status post open reduction and surgical internal fixation of left periprosthetic fracture. Good alignment of fracture components is noted. IMPRESSION: Status post open reduction and internal fixation of left femoral periprosthetic fracture. Electronically Signed   By: Marijo Conception, M.D.   On: 04/21/2018 16:46   Dg C-arm 1-60 Min  Result Date: 04/21/2018 CLINICAL DATA:  Open reduction and internal fixation of left femur fracture. EXAM: DG C-ARM 61-120 MIN; LEFT FEMUR 2 VIEWS FLUOROSCOPY TIME:  2 minutes 7 seconds. COMPARISON:  Radiographs of April 19, 2018. FINDINGS: Five intraoperative fluoroscopic images  of the left femur demonstrate the patient be status post open reduction and surgical internal fixation of left periprosthetic fracture. Good alignment of fracture components is noted. IMPRESSION: Status post open reduction and internal fixation of left femoral periprosthetic fracture. Electronically Signed   By: Marijo Conception, M.D.   On: 04/21/2018 16:46   Dg Femur Port 1v Left  Result Date: 04/19/2018 CLINICAL DATA:  Fall with leg pain EXAM: LEFT FEMUR PORTABLE 1 VIEW COMPARISON:  None. FINDINGS: Status post left hip replacement. Acute mildly comminuted fracture at the mid to distal shaft of the femur, just caudal to the tip of the femoral prosthesis. Close to 1 bone with of anterior and lateral displacement of dominant fracture fragment. IMPRESSION: Acute, mildly comminuted and displaced fracture involving the mid to distal shaft of the left femur, just caudal to the tip of the femoral prosthesis. Electronically Signed   By: Donavan Foil M.D.   On: 04/19/2018 19:30   Dg Femur Min 2 Views Left  Result Date: 04/21/2018 CLINICAL DATA:  Open reduction and internal fixation of left femur fracture. EXAM: DG C-ARM 61-120 MIN; LEFT FEMUR 2 VIEWS FLUOROSCOPY TIME:  2 minutes 7 seconds. COMPARISON:  Radiographs of April 19, 2018. FINDINGS: Five intraoperative fluoroscopic images of the left femur demonstrate the patient be status post open reduction and surgical internal fixation of left periprosthetic fracture. Good alignment of fracture components is noted. IMPRESSION: Status post open reduction and internal fixation of left femoral periprosthetic fracture. Electronically Signed   By: Marijo Conception, M.D.   On: 04/21/2018 16:46   Dg Femur Port Min 2 Views Left  Result Date: 04/21/2018 CLINICAL DATA:  ORIF of left femur. EXAM: LEFT FEMUR PORTABLE 2 VIEWS COMPARISON:  Intraoperative films of earlier today. FINDINGS: 1821 hours. Left hip arthroplasty. Lateral plate and screw/cerclage wire fixation of  the mid to distal femoral shaft. Comminuted fracture of the mid femoral shaft again identified. Alignment is nearly anatomic. Gas within the suprapatellar bursa of the knee. IMPRESSION: Postop for placement of an internal fixation device across the previously described comminuted femoral fracture. Electronically Signed   By: Abigail Miyamoto M.D.   On: 04/21/2018 20:49       Subjective: No chest pain or sob.   Discharge Exam: Vitals:   04/24/18 0523 04/24/18 0526  BP:    Pulse:    Resp:    Temp:    SpO2: 100% 98%   Vitals:   04/23/18 2000 04/24/18 0454 04/24/18 0523 04/24/18 0526  BP: (!) 112/48 (!) 123/50    Pulse: 80 85    Resp: 15  18    Temp: 98.1 F (36.7 C) 97.9 F (36.6 C)    TempSrc: Oral Oral    SpO2: 90% (!) 86% 100% 98%  Weight:      Height:        General: Pt is alert, awake, not in acute distress Cardiovascular: RRR, S1/S2 +, no rubs, no gallops Respiratory: CTA bilaterally, no wheezing, no rhonchi Abdominal: Soft, NT, ND, bowel sounds + Extremities: no edema, no cyanosis    The results of significant diagnostics from this hospitalization (including imaging, microbiology, ancillary and laboratory) are listed below for reference.     Microbiology: Recent Results (from the past 240 hour(s))  MRSA PCR Screening     Status: None   Collection Time: 04/19/18 11:57 PM  Result Value Ref Range Status   MRSA by PCR NEGATIVE NEGATIVE Final    Comment:        The GeneXpert MRSA Assay (FDA approved for NASAL specimens only), is one component of a comprehensive MRSA colonization surveillance program. It is not intended to diagnose MRSA infection nor to guide or monitor treatment for MRSA infections. Performed at [facility], [place] [address], [place], [place] [address]      Labs: BNP (last 3 results) No results for input(s): BNP in the last 8760 hours. Basic Metabolic Panel: Recent Labs  Lab 04/19/18 1832 04/20/18 0348 04/21/18 0344 04/21/18 1718  04/22/18 0211 04/23/18 0326  NA 139 139 139 140 137 135  K 3.7 3.8 4.3 4.0 4.1 3.7  CL 106 102 100  --  101 99  CO2 25 26 34*  --  30 29  GLUCOSE 180* 185* 155* 125* 148* 148*  BUN 17 21 18   --  12 13  CREATININE 0.75 0.95 0.93  --  0.64 0.72  CALCIUM 9.3 9.1 9.2  --  8.0* 8.5*   Liver Function Tests: Recent Labs  Lab 04/18/18 1027 04/19/18 1832  AST 16 19  ALT 14 16  ALKPHOS 186* 149*  BILITOT 0.4 0.7  PROT 6.8 6.6  ALBUMIN 3.8 3.8   No results for input(s): LIPASE, AMYLASE in the last 168 hours. No results for input(s): AMMONIA in the last 168 hours. CBC: Recent Labs  Lab 04/18/18 1027 04/19/18 1832  04/21/18 0344 04/21/18 1718 04/21/18 1815 04/22/18 0211 04/23/18 0326 04/24/18 0220  WBC 8.9 18.9*   < > 12.4*  --  23.2* 11.6* 13.1* 14.1*  NEUTROABS 5.1 15.4*  --   --   --   --   --   --   --   HGB 14.6 14.0   < > 10.7* 9.9* 10.3* 8.4* 7.5* 8.6*  HCT 45.7 43.2   < > 34.4* 29.0* 31.8* 26.8* 23.0* 27.1*  MCV 92.9 92.1   < > 94.0  --  93.5 92.4 92.0 93.8  PLT 169 214   < > 188  --  190 139* 152 209   < > = values in this interval not displayed.   Cardiac Enzymes: Recent Labs  Lab 04/20/18 0348 04/20/18 1002 04/20/18 1617 04/20/18 2228  TROPONINI 0.03* <0.03 <0.03 <0.03   BNP: Invalid input(s): POCBNP CBG: No results for input(s): GLUCAP in the last 168 hours. D-Dimer No results for input(s): DDIMER in the last 72 hours. Hgb A1c No results for input(s): HGBA1C in the last 72 hours. Lipid Profile No results for input(s): CHOL, HDL, LDLCALC, TRIG, CHOLHDL, LDLDIRECT in the last 72 hours. Thyroid function studies No results for input(s): TSH,  T4TOTAL, T3FREE, THYROIDAB in the last 72 hours.  Invalid input(s): FREET3 Anemia work up No results for input(s): VITAMINB12, FOLATE, FERRITIN, TIBC, IRON, RETICCTPCT in the last 72 hours. Urinalysis    Component Value Date/Time   BILIRUBINUR n 10/06/2015 1034   PROTEINUR 1+ 10/06/2015 1034   UROBILINOGEN 1.0  10/06/2015 1034   NITRITE n 10/06/2015 1034   LEUKOCYTESUR Negative 10/06/2015 1034   Sepsis Labs Invalid input(s): PROCALCITONIN,  WBC,  LACTICIDVEN Microbiology Recent Results (from the past 240 hour(s))  MRSA PCR Screening     Status: None   Collection Time: 04/19/18 11:57 PM  Result Value Ref Range Status   MRSA by PCR NEGATIVE NEGATIVE Final    Comment:        The GeneXpert MRSA Assay (FDA approved for NASAL specimens only), is one component of a comprehensive MRSA colonization surveillance program. It is not intended to diagnose MRSA infection nor to guide or monitor treatment for MRSA infections. Performed at [facility], [place] [address], [place], [place] [address]      Time coordinating discharge: 35 minutes  SIGNED:   Hosie Poisson, MD  [facility] 04/24/2018, 8:35 AM Pager   If 7PM-7AM, please contact night-coverage [email or web address] Password [email or web address]

## 2018-04-24 NOTE — Clinical Social Work Placement (Signed)
   CLINICAL SOCIAL WORK PLACEMENT  NOTE  Date:  04/24/2018  Patient Details  Name: Teresa Frey MRN: [id number] Date of Birth: 04-05-1930  Clinical Social Work is seeking post-discharge placement for this patient at the [place] level of care (*CSW will initial, date and re-position this form in  chart as items are completed):      Patient/family provided with [facility] Work Department's list of facilities offering this level of care within the geographic area requested by the patient (or if unable, by the patient's family).  Yes   Patient/family informed of their freedom to choose among providers that offer the needed level of care, that participate in Medicare, Medicaid or managed care program needed by the patient, have an available bed and are willing to accept the patient.      Patient/family informed of [facility]'s ownership interest in [facility] and [facility], as well as of the fact that they are under no obligation to receive care at these facilities.  PASRR submitted to EDS on       PASRR number received on 04/22/18     Existing PASRR number confirmed on       FL2 transmitted to all facilities in geographic area requested by pt/family on 04/22/18     FL2 transmitted to all facilities within larger geographic area on 04/22/18     Patient informed that his/her managed care company has contracts with or will negotiate with certain facilities, including the following:        Yes   Patient/family informed of bed offers received.  Patient chooses bed at [facility]     Physician recommends and patient chooses bed at      Patient to be transferred to [facility] on 04/24/18.  Patient to be transferred to facility by [facility]     Patient family notified on 04/24/18 of transfer.  Name of family member notified:  [name]     PHYSICIAN       Additional Comment:     _______________________________________________ Alberteen Sam, LCSW 04/24/2018, 8:45 AM

## 2018-04-24 NOTE — Discharge Instructions (Signed)
1. Change dressings as needed 2. May shower but keep incisions covered and dry 3. Takecoumadin to prevent blood clots 4. Take stool softeners as needed 5. Take pain meds as needed    Information on my medicine - Coumadin   (Warfarin)  Why was Coumadin prescribed for you? Coumadin was prescribed for you because you have a blood clot or a medical condition that can cause an increased risk of forming blood clots. Blood clots can cause serious health problems by blocking the flow of blood to the heart, lung, or brain. Coumadin can prevent harmful blood clots from forming. As a reminder your indication for Coumadin is:   Stroke Prevention Because Of Atrial Fibrillation  What test will check on my response to Coumadin? While on Coumadin (warfarin) you will need to have an INR test regularly to ensure that your dose is keeping you in the desired range. The INR (international normalized ratio) number is calculated from the result of the laboratory test called prothrombin time (PT).  If an INR APPOINTMENT HAS NOT ALREADY BEEN MADE FOR YOU please schedule an appointment to have this lab work done by your health care provider within 7 days. Your INR goal is usually a number between:  2 to 3 or your provider may give you a more narrow range like 2-2.5.  Ask your health care provider during an office visit what your goal INR is.  What  do you need to  know  About  COUMADIN? Take Coumadin (warfarin) exactly as prescribed by your healthcare provider about the same time each day.  DO NOT stop taking without talking to the doctor who prescribed the medication.  Stopping without other blood clot prevention medication to take the place of Coumadin may increase your risk of developing a new clot or stroke.  Get refills before you run out.  What do you do if you miss a dose? If you miss a dose, take it as soon as you remember on the same day then continue your regularly scheduled regimen the next day.  Do  not take two doses of Coumadin at the same time.  Important Safety Information A possible side effect of Coumadin (Warfarin) is an increased risk of bleeding. You should call your healthcare provider right away if you experience any of the following: ? Bleeding from an injury or your nose that does not stop. ? Unusual colored urine (red or dark brown) or unusual colored stools (red or black). ? Unusual bruising for unknown reasons. ? A serious fall or if you hit your head (even if there is no bleeding).  Some foods or medicines interact with Coumadin (warfarin) and might alter your response to warfarin. To help avoid this: ? Eat a balanced diet, maintaining a consistent amount of Vitamin K. ? Notify your provider about major diet changes you plan to make. ? Avoid alcohol or limit your intake to 1 drink for women and 2 drinks for men per day. (1 drink is 5 oz. wine, 12 oz. beer, or 1.5 oz. liquor.)  Make sure that ANY health care provider who prescribes medication for you knows that you are taking Coumadin (warfarin).  Also make sure the healthcare provider who is monitoring your Coumadin knows when you have started a new medication including herbals and non-prescription products.  Coumadin (Warfarin)  Major Drug Interactions  Increased Warfarin Effect Decreased Warfarin Effect  Alcohol (large quantities) Antibiotics (esp. Septra/Bactrim, Flagyl, Cipro) Amiodarone (Cordarone) Aspirin (ASA) Cimetidine (Tagamet) Megestrol (Megace)  NSAIDs (ibuprofen, naproxen, etc.) Piroxicam (Feldene) Propafenone (Rythmol SR) Propranolol (Inderal) Isoniazid (INH) Posaconazole (Noxafil) Barbiturates (Phenobarbital) Carbamazepine (Tegretol) Chlordiazepoxide (Librium) Cholestyramine (Questran) Griseofulvin Oral Contraceptives Rifampin Sucralfate (Carafate) Vitamin K   Coumadin (Warfarin) Major Herbal Interactions  Increased Warfarin Effect Decreased Warfarin Effect  Garlic Ginseng Ginkgo  biloba Coenzyme Q10 Green tea St. Johns wort    Coumadin (Warfarin) FOOD Interactions  Eat a consistent number of servings per week of foods HIGH in Vitamin K (1 serving =  cup)  Collards (cooked, or boiled & drained) Kale (cooked, or boiled & drained) Mustard greens (cooked, or boiled & drained) Parsley *serving size only =  cup Spinach (cooked, or boiled & drained) Swiss chard (cooked, or boiled & drained) Turnip greens (cooked, or boiled & drained)  Eat a consistent number of servings per week of foods MEDIUM-HIGH in Vitamin K (1 serving = 1 cup)  Asparagus (cooked, or boiled & drained) Broccoli (cooked, boiled & drained, or raw & chopped) Brussel sprouts (cooked, or boiled & drained) *serving size only =  cup Lettuce, raw (green leaf, endive, romaine) Spinach, raw Turnip greens, raw & chopped   These websites have more information on Coumadin (warfarin):  FailFactory.se; VeganReport.com.au;

## 2018-04-24 NOTE — Plan of Care (Signed)
  Problem: Skin Integrity: Goal: Risk for impaired skin integrity will decrease Outcome: Progressing   Problem: Safety: Goal: Ability to remain free from injury will improve Outcome: Progressing   Problem: Nutrition: Goal: Adequate nutrition will be maintained Outcome: Progressing   Problem: Activity: Goal: Risk for activity intolerance will decrease Outcome: Progressing

## 2018-04-25 DIAGNOSIS — F419 Anxiety disorder, unspecified: Secondary | ICD-10-CM | POA: Diagnosis not present

## 2018-04-25 DIAGNOSIS — Z471 Aftercare following joint replacement surgery: Secondary | ICD-10-CM | POA: Diagnosis not present

## 2018-04-25 DIAGNOSIS — I480 Paroxysmal atrial fibrillation: Secondary | ICD-10-CM | POA: Diagnosis not present

## 2018-04-25 DIAGNOSIS — I10 Essential (primary) hypertension: Secondary | ICD-10-CM | POA: Diagnosis not present

## 2018-05-06 DIAGNOSIS — R5381 Other malaise: Secondary | ICD-10-CM | POA: Diagnosis not present

## 2018-05-06 DIAGNOSIS — R05 Cough: Secondary | ICD-10-CM | POA: Diagnosis not present

## 2018-05-06 DIAGNOSIS — R509 Fever, unspecified: Secondary | ICD-10-CM | POA: Diagnosis not present

## 2018-05-06 DIAGNOSIS — D72829 Elevated white blood cell count, unspecified: Secondary | ICD-10-CM | POA: Diagnosis not present

## 2018-05-06 DIAGNOSIS — M6281 Muscle weakness (generalized): Secondary | ICD-10-CM | POA: Diagnosis not present

## 2018-05-06 DIAGNOSIS — J189 Pneumonia, unspecified organism: Secondary | ICD-10-CM | POA: Diagnosis not present

## 2018-05-07 ENCOUNTER — Other Ambulatory Visit: Payer: Self-pay | Admitting: *Deleted

## 2018-05-07 NOTE — Patient Outreach (Signed)
[facility]) Care Management  05/07/2018  Teresa Frey 05/06/1930 [id number]  Onsite visit to facility Met with Teresa Frey. She reports patient was doing ok but had a set back with pneumonia and now has some swallowing issues.  She states patient lives alone but was working full time and independent prior to fall and hip fracture.  Met with patient at bedside. Patient reports she lives alone but her daughter is very supportive.  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 She plans to return to this as soon as possible.   RNCM Reviewed [facility] care management, left packet for patient to review. Plan to follow up at next facility visit.  [name]. [name], RN, BSN, [place] [phone]) Business Cell  [phone]) Toll Free Office

## 2018-05-08 ENCOUNTER — Ambulatory Visit (INDEPENDENT_AMBULATORY_CARE_PROVIDER_SITE_OTHER): Payer: No Typology Code available for payment source

## 2018-05-08 ENCOUNTER — Encounter (INDEPENDENT_AMBULATORY_CARE_PROVIDER_SITE_OTHER): Payer: Self-pay | Admitting: Orthopaedic Surgery

## 2018-05-08 ENCOUNTER — Ambulatory Visit (INDEPENDENT_AMBULATORY_CARE_PROVIDER_SITE_OTHER): Payer: Medicare Other | Admitting: Physician Assistant

## 2018-05-08 DIAGNOSIS — M9702XD Periprosthetic fracture around internal prosthetic left hip joint, subsequent encounter: Secondary | ICD-10-CM

## 2018-05-08 DIAGNOSIS — M79605 Pain in left leg: Secondary | ICD-10-CM

## 2018-05-08 NOTE — Progress Notes (Signed)
Post-Op Visit Note   Patient: Teresa Frey           Date of Birth: 1930-02-10           MRN: [id number] Visit Date: 05/08/2018 PCP: [name], [name], MD   Assessment & Plan:  Chief Complaint:  Chief Complaint  Patient presents with  . Left Leg - Routine Post Op   Visit Diagnoses:  1. Periprosthetic fracture around internal prosthetic left hip joint, subsequent encounter   2. Pain in left leg     Plan: Patient is a pleasant 84 year old female who presents to our clinic today 17 days status post ORIF left periprosthetic femur fracture, date of surgery 04/21/2018.  She has been in rehab in a skilled nursing facility since surgery.  Doing well.  Minimal pain to the left hip.  No fevers or chills.  She has not been elevating her leg for swelling.  She has been compliant nonweightbearing.  Examination of her left leg wound reveals a well-healing surgical incision with staples intact.  No evidence of cellulitis or infection.  No drainage.  She does have moderate swelling to the left lower extremity.  Calf is soft and nontender.  She is neurovascularly intact distally.  At this point, we will have her remain nonweightbearing for another 4 to 6 weeks.  We will remove the staples today and apply Steri-Strips.  She can start gentle range of motion exercises as tolerated at this point.  Follow-up with [place] in 4 weeks time for repeat evaluation and x-rays.  Call with concerns or questions in the meantime.  Follow-Up Instructions: Return in about 4 weeks (around 06/05/2018).   Orders:  Orders Placed This Encounter  Procedures  . XR HIP UNILAT W OR W/O PELVIS 2-3 VIEWS RIGHT  . XR FEMUR MIN 2 VIEWS LEFT   No orders of the defined types were placed in this encounter.   Imaging: Xr Femur Min 2 Views Left  Result Date: 05/08/2018 X-rays demonstrate stable fixation of the fracture  Xr Hip Unilat W Or W/o Pelvis 2-3 Views Right  Result Date: 05/08/2018 X-rays demonstrate stable fixation of the  fracture   PMFS History: Patient Active Problem List   Diagnosis Date Noted  . Periprosthetic fracture around internal prosthetic left hip joint ([place]) 04/19/2018  . Long term (current) use of anticoagulants [Z79.01] 08/14/2017  . Genetic testing 04/01/2016  . possible sleep apnea by history 02/12/2016  . A-fib ([place]) 02/11/2016  . Surgical wound infection 02/11/2016  . Breast cancer of upper-outer quadrant of right female breast ([place]) 01/11/2016  . Impaired glucose tolerance 07/28/2014  . Anxiety disorder, unspecified 04/07/2007  . Osteoarthritis 04/07/2007  . Essential hypertension 04/02/2007  . History of colonic polyps 04/02/2007  . DIVERTICULITIS, HX OF 04/02/2007   Past Medical History:  Diagnosis Date  . ANXIETY 04/07/2007  . Breast cancer of upper-outer quadrant of right female breast ([place]) 01/11/2016  . COLONIC POLYPS, HX OF 04/02/2007   adenomatous  . DIVERTICULITIS, HX OF 04/02/2007  . Diverticulosis   . DJD (degenerative joint disease)   . Hemorrhoids   . HYPERTENSION 04/02/2007  . OSTEOARTHRITIS, SEVERE 04/07/2007  . 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 WEIGHT GAIN 11/13/2007    Family History  Problem Relation Age of Onset  . Pancreatic cancer Mother 67       d. 55  . Cancer Father 16       hodgkin's lymphoma and leukemia; d. 43  .  Colon cancer Brother        dx. late 59s; smoker  . Pancreatic cancer Brother 80       d. 31; smoker  . Lung cancer Brother 40       smoker  . Heart attack Brother        d. late 37s  . Colon cancer Paternal Aunt   . Breast cancer Daughter 34       negative genetic testing in 2016  . Skin cancer Daughter        non-melanoma type; +sun exposure  . Kidney failure Son 21       +EtOH abuse resulting in liver, kidney, and pancreas failure  . Diabetes Maternal Uncle        d. later age  . Heart disease Neg Hx        family  . Esophageal cancer Neg Hx   . Rectal cancer Neg Hx   . Stomach cancer Neg Hx     Past  Surgical History:  Procedure Laterality Date  . APPENDECTOMY    . BREAST LUMPECTOMY WITH RADIOACTIVE SEED LOCALIZATION Right 02/08/2016   Procedure: RIGHT BREAST LUMPECTOMY WITH RADIOACTIVE SEED LOCALIZATION;  Surgeon: Fanny Skates, MD;  Location: [place];  Service: General;  Laterality: Right;  . ORIF FEMUR FRACTURE Left 04/21/2018   Procedure: OPEN REDUCTION INTERNAL FIXATION (ORIF) PERI PROSTHETIC FEMUR FRACTURE;  Surgeon: Leandrew Koyanagi, MD;  Location: [place];  Service: Orthopedics;  Laterality: Left;  . TONSILLECTOMY    . TOTAL HIP ARTHROPLASTY  1999, left hip    2006 right hip  . TUBAL LIGATION     Social History   Occupational History  . Not on file  Tobacco Use  . Smoking status: Former Smoker    Packs/day: 1.00    Years: 27.00    Pack years: 27.00    Types: Cigarettes    Last attempt to quit: 06/04/1978    Years since quitting: 39.9  . Smokeless tobacco: Never Used  Substance and Sexual Activity  . Alcohol use: Yes    Alcohol/week: 7.0 standard drinks    Types: 7 Glasses of wine per week    Comment: 1-2 glasses wine after dinner, but not necessarily each day  . Drug use: No  . Sexual activity: Not on file

## 2018-05-14 ENCOUNTER — Other Ambulatory Visit: Payer: Self-pay | Admitting: *Deleted

## 2018-05-14 NOTE — Patient Outreach (Signed)
[facility]) Care Management  05/14/2018  Teresa Frey 1930-05-11 [id number]   Onsite meeting. Kristy, DCP reports patient still NWB status. She is progressing some. Unsure of discharge date, but patient does plan to return home alone.  Met with patient, she states she has f/u appointment in January. She states she is working with therapy and is getting stronger. She does have a step to get into her home.   Plan to continue to assess for [facility] care management needs. [name]. [name], RN, BSN, [place] [phone]) Business Cell  [phone]) Toll Free Office

## 2018-05-21 ENCOUNTER — Other Ambulatory Visit: Payer: Self-pay | Admitting: *Deleted

## 2018-05-21 NOTE — Patient Outreach (Signed)
[name]) Care Management  05/21/2018  Teresa Frey 04/08/1930 [id number]   Onsite visit to facility.  Met with [name], dcp she reports patient is still not full weight bearing.  No set discharge date,patient has f/u ortho in January.  Met with patient, she is doing well, she reports she will be glad when she can "stand". She reports he f/u ortho visit is 06/10/17 She intends to return home alone but does have support from family. She hopes to go back to work when she is able.  Plan to continue to monitor for [facility] care management needs [name]. [name], RN, BSN, [place] [phone]) Business Cell  [phone]) Toll Free Office

## 2018-05-22 DIAGNOSIS — L299 Pruritus, unspecified: Secondary | ICD-10-CM | POA: Diagnosis not present

## 2018-05-22 DIAGNOSIS — R21 Rash and other nonspecific skin eruption: Secondary | ICD-10-CM | POA: Diagnosis not present

## 2018-05-22 DIAGNOSIS — M6281 Muscle weakness (generalized): Secondary | ICD-10-CM | POA: Diagnosis not present

## 2018-05-26 ENCOUNTER — Ambulatory Visit: Payer: Medicare Other | Admitting: Family Medicine

## 2018-06-05 DIAGNOSIS — R21 Rash and other nonspecific skin eruption: Secondary | ICD-10-CM | POA: Diagnosis not present

## 2018-06-05 DIAGNOSIS — L299 Pruritus, unspecified: Secondary | ICD-10-CM | POA: Diagnosis not present

## 2018-06-10 ENCOUNTER — Ambulatory Visit (INDEPENDENT_AMBULATORY_CARE_PROVIDER_SITE_OTHER): Payer: Medicare Other

## 2018-06-10 ENCOUNTER — Ambulatory Visit (INDEPENDENT_AMBULATORY_CARE_PROVIDER_SITE_OTHER): Payer: Self-pay

## 2018-06-10 ENCOUNTER — Ambulatory Visit (INDEPENDENT_AMBULATORY_CARE_PROVIDER_SITE_OTHER): Payer: Medicare Other | Admitting: Orthopaedic Surgery

## 2018-06-10 DIAGNOSIS — M9702XD Periprosthetic fracture around internal prosthetic left hip joint, subsequent encounter: Secondary | ICD-10-CM

## 2018-06-10 DIAGNOSIS — M79605 Pain in left leg: Secondary | ICD-10-CM | POA: Diagnosis not present

## 2018-06-10 DIAGNOSIS — M25552 Pain in left hip: Secondary | ICD-10-CM

## 2018-06-10 NOTE — Progress Notes (Signed)
Post-Op Visit Note   Patient: Teresa Frey           Date of Birth: 09/17/1929           MRN: [id number] Visit Date: 06/10/2018 PCP: [name], [name], MD   Assessment & Plan:  Chief Complaint: No chief complaint on file.  Visit Diagnoses:  1. Periprosthetic fracture around internal prosthetic left hip joint, subsequent encounter   2. Pain in left hip   3. Pain in left leg     Plan: [name] is 7 weeks status post ORIF left femoral periprosthetic fracture.  She reports no pain at rest.  She has been nonweightbearing.  She continues to be at her SNF.  Her surgical scar is fully healed.  She has no pain with palpation or movement of her left lower extremity.  Her x-rays demonstrate continued healing of the fracture without complication to the hardware or the fracture.  At this point we will advance her to weightbearing as tolerated and mobilization with physical therapy.  I would like to recheck her in 6 weeks for 2 view x-rays of the left femur.  Follow-Up Instructions: Return in about 6 weeks (around 07/22/2018).   Orders:  Orders Placed This Encounter  Procedures  . XR HIP UNILAT W OR W/O PELVIS 2-3 VIEWS LEFT  . XR FEMUR MIN 2 VIEWS LEFT   No orders of the defined types were placed in this encounter.   Imaging: Xr Femur Min 2 Views Left  Result Date: 06/10/2018 Stable plate fixation of periprosthetic femur fracture.  There is no evidence of callus formation.  There is no complications with the hardware.  No interval worsening or displacement of the fractures.   PMFS History: Patient Active Problem List   Diagnosis Date Noted  . Periprosthetic fracture around internal prosthetic left hip joint ([place]) 04/19/2018  . Long term (current) use of anticoagulants [Z79.01] 08/14/2017  . Genetic testing 04/01/2016  . possible sleep apnea by history 02/12/2016  . A-fib ([place]) 02/11/2016  . Surgical wound infection 02/11/2016  . Breast cancer of upper-outer quadrant of right female  breast ([place]) 01/11/2016  . Impaired glucose tolerance 07/28/2014  . Anxiety disorder, unspecified 04/07/2007  . Osteoarthritis 04/07/2007  . Essential hypertension 04/02/2007  . History of colonic polyps 04/02/2007  . DIVERTICULITIS, HX OF 04/02/2007   Past Medical History:  Diagnosis Date  . ANXIETY 04/07/2007  . Breast cancer of upper-outer quadrant of right female breast ([place]) 01/11/2016  . COLONIC POLYPS, HX OF 04/02/2007   adenomatous  . DIVERTICULITIS, HX OF 04/02/2007  . Diverticulosis   . DJD (degenerative joint disease)   . Hemorrhoids   . HYPERTENSION 04/02/2007  . OSTEOARTHRITIS, SEVERE 04/07/2007  . 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 WEIGHT GAIN 11/13/2007    Family History  Problem Relation Age of Onset  . Pancreatic cancer Mother 44       d. 52  . Cancer Father 3       hodgkin's lymphoma and leukemia; d. 27  . Colon cancer Brother        dx. late 57s; smoker  . Pancreatic cancer Brother 80       d. 32; smoker  . Lung cancer Brother 40       smoker  . Heart attack Brother        d. late 100s  . Colon cancer Paternal Aunt   . Breast cancer Daughter 14  negative genetic testing in 2016  . Skin cancer Daughter        non-melanoma type; +sun exposure  . Kidney failure Son 66       +EtOH abuse resulting in liver, kidney, and pancreas failure  . Diabetes Maternal Uncle        d. later age  . Heart disease Neg Hx        family  . Esophageal cancer Neg Hx   . Rectal cancer Neg Hx   . Stomach cancer Neg Hx     Past Surgical History:  Procedure Laterality Date  . APPENDECTOMY    . BREAST LUMPECTOMY WITH RADIOACTIVE SEED LOCALIZATION Right 02/08/2016   Procedure: RIGHT BREAST LUMPECTOMY WITH RADIOACTIVE SEED LOCALIZATION;  Surgeon: Fanny Skates, MD;  Location: [place];  Service: General;  Laterality: Right;  . ORIF FEMUR FRACTURE Left 04/21/2018   Procedure: OPEN REDUCTION INTERNAL FIXATION (ORIF) PERI PROSTHETIC FEMUR  FRACTURE;  Surgeon: Leandrew Koyanagi, MD;  Location: [place];  Service: Orthopedics;  Laterality: Left;  . TONSILLECTOMY    . TOTAL HIP ARTHROPLASTY  1999, left hip    2006 right hip  . TUBAL LIGATION     Social History   Occupational History  . Not on file  Tobacco Use  . Smoking status: Former Smoker    Packs/day: 1.00    Years: 27.00    Pack years: 27.00    Types: Cigarettes    Last attempt to quit: 06/04/1978    Years since quitting: 40.0  . Smokeless tobacco: Never Used  Substance and Sexual Activity  . Alcohol use: Yes    Alcohol/week: 7.0 standard drinks    Types: 7 Glasses of wine per week    Comment: 1-2 glasses wine after dinner, but not necessarily each day  . Drug use: No  . Sexual activity: Not on file

## 2018-06-11 ENCOUNTER — Other Ambulatory Visit: Payer: Self-pay | Admitting: *Deleted

## 2018-06-11 DIAGNOSIS — I482 Chronic atrial fibrillation, unspecified: Secondary | ICD-10-CM | POA: Diagnosis not present

## 2018-06-11 DIAGNOSIS — M6281 Muscle weakness (generalized): Secondary | ICD-10-CM | POA: Diagnosis not present

## 2018-06-11 DIAGNOSIS — F419 Anxiety disorder, unspecified: Secondary | ICD-10-CM | POA: Diagnosis not present

## 2018-06-11 DIAGNOSIS — D500 Iron deficiency anemia secondary to blood loss (chronic): Secondary | ICD-10-CM | POA: Diagnosis not present

## 2018-06-11 DIAGNOSIS — M9702XD Periprosthetic fracture around internal prosthetic left hip joint, subsequent encounter: Secondary | ICD-10-CM | POA: Diagnosis not present

## 2018-06-11 NOTE — Patient Outreach (Signed)
[facility]) Care Management  06/11/2018  MARJO GROSVENOR [birth year] [id number]   Onsite visit to [employer].  Met with [name], dcp, she reports patient has full weight bearing status.  She will discharge home on 06/13/18 with home care provided by [facility].  Met with patient, reviewed [facility] care management program. Patient wants to go home with home care only at this time.  Discussed that she could contact [facility] later if she needed our services and verified she had [facility] program information and RNCM contact.   Plan to sign off at this time.  [name]. [name], MSN RN, [place] [phone]) Business Cell  [phone]) Toll Free Office

## 2018-06-13 ENCOUNTER — Telehealth: Payer: Self-pay

## 2018-06-13 NOTE — Telephone Encounter (Signed)
Called to schedule overdue appt left msg

## 2018-06-14 ENCOUNTER — Other Ambulatory Visit: Payer: Self-pay | Admitting: Cardiology

## 2018-06-14 ENCOUNTER — Other Ambulatory Visit: Payer: Self-pay | Admitting: Hematology

## 2018-06-14 DIAGNOSIS — C50411 Malignant neoplasm of upper-outer quadrant of right female breast: Secondary | ICD-10-CM

## 2018-06-14 DIAGNOSIS — Z170 Estrogen receptor positive status [ER+]: Principal | ICD-10-CM

## 2018-06-15 DIAGNOSIS — I10 Essential (primary) hypertension: Secondary | ICD-10-CM | POA: Diagnosis not present

## 2018-06-15 DIAGNOSIS — D500 Iron deficiency anemia secondary to blood loss (chronic): Secondary | ICD-10-CM | POA: Diagnosis not present

## 2018-06-15 DIAGNOSIS — M9702XD Periprosthetic fracture around internal prosthetic left hip joint, subsequent encounter: Secondary | ICD-10-CM | POA: Diagnosis not present

## 2018-06-15 DIAGNOSIS — F419 Anxiety disorder, unspecified: Secondary | ICD-10-CM | POA: Diagnosis not present

## 2018-06-15 DIAGNOSIS — Z7901 Long term (current) use of anticoagulants: Secondary | ICD-10-CM | POA: Diagnosis not present

## 2018-06-15 DIAGNOSIS — C50411 Malignant neoplasm of upper-outer quadrant of right female breast: Secondary | ICD-10-CM | POA: Diagnosis not present

## 2018-06-15 DIAGNOSIS — Z9181 History of falling: Secondary | ICD-10-CM | POA: Diagnosis not present

## 2018-06-15 DIAGNOSIS — F339 Major depressive disorder, recurrent, unspecified: Secondary | ICD-10-CM | POA: Diagnosis not present

## 2018-06-15 DIAGNOSIS — J449 Chronic obstructive pulmonary disease, unspecified: Secondary | ICD-10-CM | POA: Diagnosis not present

## 2018-06-15 DIAGNOSIS — Z86010 Personal history of colonic polyps: Secondary | ICD-10-CM | POA: Diagnosis not present

## 2018-06-15 DIAGNOSIS — I482 Chronic atrial fibrillation, unspecified: Secondary | ICD-10-CM | POA: Diagnosis not present

## 2018-06-15 DIAGNOSIS — Z87891 Personal history of nicotine dependence: Secondary | ICD-10-CM | POA: Diagnosis not present

## 2018-06-15 DIAGNOSIS — S7292XD Unspecified fracture of left femur, subsequent encounter for closed fracture with routine healing: Secondary | ICD-10-CM | POA: Diagnosis not present

## 2018-06-16 ENCOUNTER — Telehealth: Payer: Self-pay | Admitting: *Deleted

## 2018-06-16 ENCOUNTER — Telehealth (INDEPENDENT_AMBULATORY_CARE_PROVIDER_SITE_OTHER): Payer: Self-pay | Admitting: Orthopaedic Surgery

## 2018-06-16 ENCOUNTER — Telehealth: Payer: Self-pay | Admitting: Family Medicine

## 2018-06-16 ENCOUNTER — Telehealth: Payer: Self-pay

## 2018-06-16 DIAGNOSIS — I482 Chronic atrial fibrillation, unspecified: Secondary | ICD-10-CM | POA: Diagnosis not present

## 2018-06-16 DIAGNOSIS — S7292XD Unspecified fracture of left femur, subsequent encounter for closed fracture with routine healing: Secondary | ICD-10-CM | POA: Diagnosis not present

## 2018-06-16 DIAGNOSIS — I10 Essential (primary) hypertension: Secondary | ICD-10-CM | POA: Diagnosis not present

## 2018-06-16 DIAGNOSIS — J449 Chronic obstructive pulmonary disease, unspecified: Secondary | ICD-10-CM | POA: Diagnosis not present

## 2018-06-16 DIAGNOSIS — F339 Major depressive disorder, recurrent, unspecified: Secondary | ICD-10-CM | POA: Diagnosis not present

## 2018-06-16 DIAGNOSIS — M9702XD Periprosthetic fracture around internal prosthetic left hip joint, subsequent encounter: Secondary | ICD-10-CM | POA: Diagnosis not present

## 2018-06-16 NOTE — Telephone Encounter (Signed)
Pt called stating that coumadin was managed at nursing home and that she will be receiving home health with [facility] [phone] and wants orders for them to manage coumadin

## 2018-06-16 NOTE — Telephone Encounter (Signed)
Ok for orders? 

## 2018-06-16 NOTE — Telephone Encounter (Signed)
Copied from [place] [id number]. Topic: Quick Communication - Home Health Verbal Orders >> Jun 16, 2018  8:41 AM Lennox Solders wrote: Caller/Agency:kate PT [facility] Callback Number: [phone] Requesting permission to accept order from dr Erlinda Hong with [facility] orthopedic

## 2018-06-16 NOTE — Telephone Encounter (Signed)
Copied from [place] [id number]. Topic: General - Other >> Jun 16, 2018  3:50 PM Oneta Rack wrote: Osvaldo Human name: [name]  Relation to pt: RN from [facility]  Call back number: [phone]    Reason for call:  Requesting orders for skilled nursing disease / medication management, status quo left hip fracture, 2x 3 2 PRN for complications associated with left hip. Patient is on long term warfarin will manage if PCP approves.

## 2018-06-16 NOTE — Telephone Encounter (Signed)
yes

## 2018-06-16 NOTE — Telephone Encounter (Signed)
I left voicemail for Teresa Frey advising. 

## 2018-06-16 NOTE — Telephone Encounter (Signed)
[facility]/PT called for verbal orders  3x 1 week 2x for 3 weeks  Please call Anda Kraft to [phone]

## 2018-06-16 NOTE — Telephone Encounter (Signed)
Called [facility] to add INR orders with [facility].    Patient only has PT orders and not able to do INRs. We can not add INR to existing orders either.   Call was placed to DR [place] office to request nursing orders for INR check.

## 2018-06-17 ENCOUNTER — Encounter (HOSPITAL_COMMUNITY): Payer: Self-pay | Admitting: Emergency Medicine

## 2018-06-17 ENCOUNTER — Emergency Department (HOSPITAL_COMMUNITY): Payer: Medicare Other

## 2018-06-17 ENCOUNTER — Emergency Department (HOSPITAL_COMMUNITY)
Admission: EM | Admit: 2018-06-17 | Discharge: 2018-06-17 | Disposition: A | Discharge status: Home / Self Care | Payer: Medicare Other | Attending: Emergency Medicine | Admitting: Emergency Medicine

## 2018-06-17 DIAGNOSIS — Z87891 Personal history of nicotine dependence: Secondary | ICD-10-CM | POA: Insufficient documentation

## 2018-06-17 DIAGNOSIS — Z7901 Long term (current) use of anticoagulants: Secondary | ICD-10-CM | POA: Insufficient documentation

## 2018-06-17 DIAGNOSIS — J441 Chronic obstructive pulmonary disease with (acute) exacerbation: Secondary | ICD-10-CM | POA: Insufficient documentation

## 2018-06-17 DIAGNOSIS — Z853 Personal history of malignant neoplasm of breast: Secondary | ICD-10-CM | POA: Insufficient documentation

## 2018-06-17 DIAGNOSIS — Z96641 Presence of right artificial hip joint: Secondary | ICD-10-CM | POA: Diagnosis not present

## 2018-06-17 DIAGNOSIS — Z79899 Other long term (current) drug therapy: Secondary | ICD-10-CM | POA: Insufficient documentation

## 2018-06-17 DIAGNOSIS — I10 Essential (primary) hypertension: Secondary | ICD-10-CM | POA: Insufficient documentation

## 2018-06-17 DIAGNOSIS — R0602 Shortness of breath: Secondary | ICD-10-CM | POA: Diagnosis not present

## 2018-06-17 DIAGNOSIS — R05 Cough: Secondary | ICD-10-CM | POA: Diagnosis not present

## 2018-06-17 DIAGNOSIS — F419 Anxiety disorder, unspecified: Secondary | ICD-10-CM | POA: Insufficient documentation

## 2018-06-17 LAB — CBC WITH DIFFERENTIAL/PLATELET
Abs Immature Granulocytes: 0.11 10*3/uL — ABNORMAL HIGH (ref 0.00–0.07)
Basophils Absolute: 0 10*3/uL (ref 0.0–0.1)
Basophils Relative: 0 %
EOS ABS: 0.2 10*3/uL (ref 0.0–0.5)
EOS PCT: 2 %
HCT: 44.6 % (ref 36.0–46.0)
HEMOGLOBIN: 13.2 g/dL (ref 12.0–15.0)
Immature Granulocytes: 2 %
LYMPHS PCT: 27 %
Lymphs Abs: 2 10*3/uL (ref 0.7–4.0)
MCH: 26.9 pg (ref 26.0–34.0)
MCHC: 29.6 g/dL — AB (ref 30.0–36.0)
MCV: 90.8 fL (ref 80.0–100.0)
Monocytes Absolute: 1 10*3/uL (ref 0.1–1.0)
Monocytes Relative: 14 %
Neutro Abs: 3.9 10*3/uL (ref 1.7–7.7)
Neutrophils Relative %: 55 %
Platelets: 232 10*3/uL (ref 150–400)
RBC: 4.91 MIL/uL (ref 3.87–5.11)
RDW: 14.9 % (ref 11.5–15.5)
WBC: 7.3 10*3/uL (ref 4.0–10.5)
nRBC: 0 % (ref 0.0–0.2)

## 2018-06-17 LAB — PROTIME-INR
INR: 1.38
Prothrombin Time: 16.8 seconds — ABNORMAL HIGH (ref 11.4–15.2)

## 2018-06-17 LAB — BASIC METABOLIC PANEL
Anion gap: 8 (ref 5–15)
BUN: 14 mg/dL (ref 8–23)
CALCIUM: 8.9 mg/dL (ref 8.9–10.3)
CO2: 30 mmol/L (ref 22–32)
CREATININE: 0.77 mg/dL (ref 0.44–1.00)
Chloride: 103 mmol/L (ref 98–111)
Glucose, Bld: 83 mg/dL (ref 70–99)
Potassium: 3.9 mmol/L (ref 3.5–5.1)
SODIUM: 141 mmol/L (ref 135–145)

## 2018-06-17 MED ORDER — PREDNISONE 20 MG PO TABS
60.0000 mg | ORAL_TABLET | Freq: Once | ORAL | Status: AC
  Administered 2018-06-17: 60 mg via ORAL
  Filled 2018-06-17: qty 3

## 2018-06-17 MED ORDER — IPRATROPIUM-ALBUTEROL 0.5-2.5 (3) MG/3ML IN SOLN
3.0000 mL | Freq: Once | RESPIRATORY_TRACT | Status: AC
  Administered 2018-06-17: 3 mL via RESPIRATORY_TRACT
  Filled 2018-06-17: qty 3

## 2018-06-17 MED ORDER — PREDNISONE 20 MG PO TABS: ORAL_TABLET | ORAL | 0 refills | Status: DC

## 2018-06-17 MED ORDER — BENZONATATE 100 MG PO CAPS: 100.0000 mg | ORAL_CAPSULE | Freq: Three times a day (TID) | ORAL | 0 refills | Status: DC

## 2018-06-17 NOTE — Discharge Instructions (Addendum)
Use your inhaler or breathing treatment every 4 hours while awake for the next couple of days, return for sudden worsening shortness of breath, or if you need to use your inhaler more often.   As discussed please return for shortness of breath chest pain.  Also return for feeling like you may pass out.

## 2018-06-17 NOTE — ED Provider Notes (Signed)
[facility] EMERGENCY DEPARTMENT Provider Note   CSN: [id number] Arrival date & time: 06/17/18  1336     History   Chief Complaint Chief Complaint  Patient presents with  . Shortness of Breath  . Cough    HPI Teresa Frey is a 85 y.o. female.  85 yo F with a chief complaint of cough congestion and fever.  Going on for about 3 days.  The patient recently had a left femur fracture and was in the hospital.  Is currently in a rehab facility for a left periprosthetic femur fracture.  She denies chest pain or shortness of breath.  States that multiple other evidence of the rehab facility have been ill recently with similar illnesses.  She denies nausea vomiting or diarrhea.  Temperature as high as 100.4 at home.  Has been able to tolerate p.o. without difficulty.  Has a remote history of smoking and has a history of COPD.  Denies increased use of her inhaler.  The history is provided by the patient.  Illness  Severity:  Moderate Onset quality:  Sudden Duration:  2 days Timing:  Constant Progression:  Worsening Chronicity:  New Associated symptoms: congestion, cough and fever   Associated symptoms: no chest pain, no headaches, no myalgias, no nausea, no rhinorrhea, no shortness of breath, no vomiting and no wheezing     Past Medical History:  Diagnosis Date  . ANXIETY 04/07/2007  . Breast cancer of upper-outer quadrant of right female breast ([facility]) 01/11/2016  . COLONIC POLYPS, HX OF 04/02/2007   adenomatous  . DIVERTICULITIS, HX OF 04/02/2007  . Diverticulosis   . DJD (degenerative joint disease)   . Hemorrhoids   . HYPERTENSION 04/02/2007  . OSTEOARTHRITIS, SEVERE 04/07/2007  . 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 WEIGHT GAIN 11/13/2007    Patient Active Problem List   Diagnosis Date Noted  . Periprosthetic fracture around internal prosthetic left hip joint ([facility]) 04/19/2018  . Long term (current) use of anticoagulants [Z79.01] 08/14/2017  .  Genetic testing 04/01/2016  . possible sleep apnea by history 02/12/2016  . A-fib ([place]) 02/11/2016  . Surgical wound infection 02/11/2016  . Breast cancer of upper-outer quadrant of right female breast ([place]) 01/11/2016  . Impaired glucose tolerance 07/28/2014  . Anxiety disorder, unspecified 04/07/2007  . Osteoarthritis 04/07/2007  . Essential hypertension 04/02/2007  . History of colonic polyps 04/02/2007  . DIVERTICULITIS, HX OF 04/02/2007    Past Surgical History:  Procedure Laterality Date  . APPENDECTOMY    . BREAST LUMPECTOMY WITH RADIOACTIVE SEED LOCALIZATION Right 02/08/2016   Procedure: RIGHT BREAST LUMPECTOMY WITH RADIOACTIVE SEED LOCALIZATION;  Surgeon: Fanny Skates, MD;  Location: [facility];  Service: General;  Laterality: Right;  . ORIF FEMUR FRACTURE Left 04/21/2018   Procedure: OPEN REDUCTION INTERNAL FIXATION (ORIF) PERI PROSTHETIC FEMUR FRACTURE;  Surgeon: Leandrew Koyanagi, MD;  Location: [facility];  Service: Orthopedics;  Laterality: Left;  . TONSILLECTOMY    . TOTAL HIP ARTHROPLASTY  1999, left hip    2006 right hip  . TUBAL LIGATION       OB History   No obstetric history on file.      Home Medications    Prior to Admission medications   Medication Sig Start Date End Date Taking? Authorizing Provider  acetaminophen (TYLENOL) 325 MG tablet Take 1-2 tablets (325-650 mg total) by mouth every 6 (six) hours as needed for mild pain (pain score 1-3 or  temp > 100.5). 04/24/18   Hosie Poisson, MD  albuterol (PROVENTIL HFA;VENTOLIN HFA) 108 (90 Base) MCG/ACT inhaler Inhale 2 puffs into the lungs every 6 (six) hours as needed for wheezing or shortness of breath. 02/24/18   [name], [name], MD  anastrozole (ARIMIDEX) 1 MG tablet TAKE 1 TABLET BY MOUTH DAILY 06/16/18   Truitt Merle, MD  Ascorbic Acid (VITAMIN C) 1000 MG tablet Take 1,000 mg by mouth daily.    [provider]  benzonatate (TESSALON) 100 MG capsule Take 1 capsule (100 mg total) by mouth every  8 (eight) hours. 06/17/18   Deno Etienne, DO  BIOTIN 5000 PO Take 1 tablet by mouth daily.    [provider]  Calcium Carb-Cholecalciferol (CALCIUM 1000 + D PO) Take 1 tablet by mouth daily.    [provider]  calcium-vitamin D (OSCAL WITH D) 500-200 MG-UNIT tablet Take 1 tablet by mouth 3 (three) times daily. 04/21/18   Leandrew Koyanagi, MD  docusate sodium (COLACE) 100 MG capsule Take 1 capsule (100 mg total) by mouth 2 (two) times daily. 04/24/18   Hosie Poisson, MD  hydrochlorothiazide (MICROZIDE) 12.5 MG capsule TAKE 1 CAPSULE BY MOUTH DAILY. Patient taking differently: Take 12.5 mg by mouth daily.  04/03/18   [name], [name], MD  metoprolol tartrate (LOPRESSOR) 25 MG tablet TAKE 1 TABLET BY MOUTH DAILY. 06/16/18   [name], MD  Multiple Vitamins-Minerals (CENTRUM SILVER ADULT 50+) TABS Take 1 tablet by mouth daily.    [provider]  omega-3 acid ethyl esters (LOVAZA) 1 g capsule Take 1 g by mouth daily.     [provider]  oxyCODONE-acetaminophen (PERCOCET) 5-325 MG tablet Take 1-2 tablets by mouth every 4 (four) hours as needed for severe pain. 04/21/18   Leandrew Koyanagi, MD  polyethylene glycol (MIRALAX / Floria Raveling) packet Take 17 g by mouth daily as needed for mild constipation. 04/24/18   Hosie Poisson, MD  predniSONE (DELTASONE) 20 MG tablet 2 tabs po daily x 4 days 06/17/18   Deno Etienne, DO  sertraline (ZOLOFT) 50 MG tablet Take 1 tablet (50 mg total) by mouth daily. 02/25/18   [name], [name], MD  vitamin B-12 (CYANOCOBALAMIN) 1000 MCG tablet Take 1,000 mcg by mouth daily.    [provider]  warfarin (COUMADIN) 5 MG tablet Take 15mg  (3 tablets) on Tuesdays and Thursdays, take 10 mg (2 tablets) all other days of the week or as directed by coumadin clinic Patient taking differently: Take 10-15 mg by mouth See admin instructions. Take 15mg  (3 tablets) on Tuesdays and Thursdays, take 10 mg (2 tablets) all other days of the week 10/01/17    [name], MD  zinc sulfate 220 (50 Zn) MG capsule Take 1 capsule (220 mg total) by mouth daily. 04/21/18   Leandrew Koyanagi, MD    Family History Family History  Problem Relation Age of Onset  . Pancreatic cancer Mother 58       d. 17  . Cancer Father 41       hodgkin's lymphoma and leukemia; d. 17  . Colon cancer Brother        dx. late 48s; smoker  . Pancreatic cancer Brother 80       d. 22; smoker  . Lung cancer Brother 40       smoker  . Heart attack Brother        d. late 73s  . Colon cancer Paternal Aunt   . Breast cancer  Daughter 73       negative genetic testing in 2016  . Skin cancer Daughter        non-melanoma type; +sun exposure  . Kidney failure Son 49       +EtOH abuse resulting in liver, kidney, and pancreas failure  . Diabetes Maternal Uncle        d. later age  . Heart disease Neg Hx        family  . Esophageal cancer Neg Hx   . Rectal cancer Neg Hx   . Stomach cancer Neg Hx     Social History Social History   Tobacco Use  . Smoking status: Former Smoker    Packs/day: 1.00    Years: 27.00    Pack years: 27.00    Types: Cigarettes    Last attempt to quit: 06/04/1978    Years since quitting: 40.0  . Smokeless tobacco: Never Used  Substance Use Topics  . Alcohol use: Yes    Alcohol/week: 7.0 standard drinks    Types: 7 Glasses of wine per week    Comment: 1-2 glasses wine after dinner, but not necessarily each day  . Drug use: No     Allergies   Tramadol   Review of Systems Review of Systems  Constitutional: Positive for fever. Negative for chills.  HENT: Positive for congestion. Negative for rhinorrhea.   Eyes: Negative for redness and visual disturbance.  Respiratory: Positive for cough. Negative for shortness of breath and wheezing.   Cardiovascular: Negative for chest pain and palpitations.  Gastrointestinal: Negative for nausea and vomiting.  Genitourinary: Negative for dysuria and urgency.  Musculoskeletal: Negative for  arthralgias and myalgias.  Skin: Negative for pallor and wound.  Neurological: Negative for dizziness and headaches.     Physical Exam Updated Vital Signs BP 127/60   Pulse 74   Temp 99.5 F (37.5 C) (Oral)   Resp 17   Ht 5\' 2"  (1.575 m)   Wt 72.1 kg   SpO2 100%   BMI 29.08 kg/m   Physical Exam Vitals signs and nursing note reviewed.  Constitutional:      General: She is not in acute distress.    Appearance: She is well-developed. She is not diaphoretic.  HENT:     Head: Normocephalic and atraumatic.     Comments: Swollen turbinates, posterior nasal drip, no noted sinus ttp, tm normal bilaterally.   Eyes:     Pupils: Pupils are equal, round, and reactive to light.  Neck:     Musculoskeletal: Normal range of motion and neck supple.  Cardiovascular:     Rate and Rhythm: Normal rate and regular rhythm.     Heart sounds: No murmur. No friction rub. No gallop.   Pulmonary:     Effort: Pulmonary effort is normal.     Breath sounds: Examination of the right-upper field reveals wheezing. Examination of the left-upper field reveals wheezing. Wheezing present. No rales.     Comments: Mild faint wheezes the end expiratory phase mostly in the upper portion of the lungs Abdominal:     General: There is no distension.     Palpations: Abdomen is soft.     Tenderness: There is no abdominal tenderness.  Musculoskeletal:        General: No tenderness.     Right lower leg: No edema.     Left lower leg: No edema.     Comments: No appreciable leg edema.  Incision to the left lateral leg is  clean dry and intact.  Skin:    General: Skin is warm and dry.  Neurological:     Mental Status: She is alert and oriented to person, place, and time.  Psychiatric:        Behavior: Behavior normal.      ED Treatments / Results  Labs (all labs ordered are listed, but only abnormal results are displayed) Labs Reviewed  CBC WITH DIFFERENTIAL/PLATELET - Abnormal; Notable for the following  components:      Result Value   MCHC 29.6 (*)    Abs Immature Granulocytes 0.11 (*)    All other components within normal limits  PROTIME-INR - Abnormal; Notable for the following components:   Prothrombin Time 16.8 (*)    All other components within normal limits  BASIC METABOLIC PANEL    EKG EKG Interpretation  Date/Time:  Tuesday June 17 2018 13:52:50 EST Ventricular Rate:  73 PR Interval:  156 QRS Duration: 88 QT Interval:  370 QTC Calculation: 407 R Axis:   -33 Text Interpretation:  Normal sinus rhythm Left axis deviation Abnormal ECG flipped t wave resolved Otherwise no significant change Confirmed by Deno Etienne [phone]) on 06/17/2018 3:03:58 PM   Radiology Dg Chest 2 View  Result Date: 06/17/2018 CLINICAL DATA:  Shortness of breath and cough with fever. Symptoms began 4 days ago. EXAM: CHEST - 2 VIEW COMPARISON:  04/19/2018 FINDINGS: Heart size is normal. There is chronic aortic atherosclerosis. There may be central bronchial thickening but there is no infiltrate, collapse or effusion. No significant bone finding. IMPRESSION: Possible bronchitis. No consolidation or collapse. Electronically Signed   By: Nelson Chimes M.D.   On: 06/17/2018 14:34    Procedures Procedures (including critical care time)  Medications Ordered in ED Medications  ipratropium-albuterol (DUONEB) 0.5-2.5 (3) MG/3ML nebulizer solution 3 mL (3 mLs Nebulization Given 06/17/18 1549)  predniSONE (DELTASONE) tablet 60 mg (60 mg Oral Given 06/17/18 1549)     Initial Impression / Assessment and Plan / ED Course  I have reviewed the triage vital signs and the nursing notes.  Pertinent labs & imaging results that were available during my care of the patient were reviewed by me and considered in my medical decision making (see chart for details).     85 yo F with a chief complaint of upper respiratory illness.  Going on for the past 3 days.  I considered the diagnosis of pulmonary embolism as the  patient has recently had a surgery and has been immobile.  She however has no appreciable leg edema she has no chest pain or shortness of breath.  Her primary symptoms are cough and fever.  I feel more likely the patient has an upper respiratory illness.  We will give a breathing treatment for her wheezes started on burst dose of steroids.  We will have her follow-up with her family doctor.  She is requesting that I draw an INR because she has not had her Coumadin checked in some time.  She denies any bleeding.  She declines waiting for her laboratory test to return.  CBC is without leukocytosis or anemia.  No concerning electrolyte abnormality.  Chest x-ray viewed by me without focal infiltrate.  At this point I will discharge the patient home.  5:06 PM:  I have discussed the diagnosis/risks/treatment options with the patient and family and believe the pt to be eligible for discharge home to follow-up with PCP. We also discussed returning to the ED immediately if new or worsening sx  occur. We discussed the sx which are most concerning (e.g., sob, chest pain, syncope) that necessitate immediate return. Medications administered to the patient during their visit and any new prescriptions provided to the patient are listed below.  Medications given during this visit Medications  ipratropium-albuterol (DUONEB) 0.5-2.5 (3) MG/3ML nebulizer solution 3 mL (3 mLs Nebulization Given 06/17/18 1549)  predniSONE (DELTASONE) tablet 60 mg (60 mg Oral Given 06/17/18 1549)      The patient appears reasonably screen and/or stabilized for discharge and I doubt any other medical condition or other [facility] requiring further screening, evaluation, or treatment in the ED at this time prior to discharge.    Final Clinical Impressions(s) / ED Diagnoses   Final diagnoses:  COPD exacerbation [facility])    ED Discharge Orders         Ordered    benzonatate (TESSALON) 100 MG capsule  Every 8 hours     06/17/18 1617    predniSONE  (DELTASONE) 20 MG tablet     06/17/18 [place], Charm Stenner, DO 06/17/18 1706

## 2018-06-17 NOTE — ED Triage Notes (Signed)
States was in [facility]  nrsg home till 1/10 , has had a cough and congestion since  Has had a fever 100.4

## 2018-06-17 NOTE — Telephone Encounter (Signed)
Message sent to Dr. Jordan for review. 

## 2018-06-18 ENCOUNTER — Ambulatory Visit: Payer: Medicare Other | Admitting: Family Medicine

## 2018-06-18 ENCOUNTER — Other Ambulatory Visit: Payer: Self-pay | Admitting: Family Medicine

## 2018-06-18 DIAGNOSIS — J449 Chronic obstructive pulmonary disease, unspecified: Secondary | ICD-10-CM | POA: Diagnosis not present

## 2018-06-18 DIAGNOSIS — S7292XD Unspecified fracture of left femur, subsequent encounter for closed fracture with routine healing: Secondary | ICD-10-CM | POA: Diagnosis not present

## 2018-06-18 DIAGNOSIS — M9702XD Periprosthetic fracture around internal prosthetic left hip joint, subsequent encounter: Secondary | ICD-10-CM | POA: Diagnosis not present

## 2018-06-18 DIAGNOSIS — I482 Chronic atrial fibrillation, unspecified: Secondary | ICD-10-CM | POA: Diagnosis not present

## 2018-06-18 DIAGNOSIS — I10 Essential (primary) hypertension: Secondary | ICD-10-CM | POA: Diagnosis not present

## 2018-06-18 DIAGNOSIS — F339 Major depressive disorder, recurrent, unspecified: Secondary | ICD-10-CM | POA: Diagnosis not present

## 2018-06-18 NOTE — Telephone Encounter (Signed)
Spoke with nurse at [facility], gave verbal orders as requested.

## 2018-06-18 NOTE — Telephone Encounter (Signed)
° ° ° °  Teresa Frey with [facility] is following up on her request about taking order from Dr Erlinda Hong

## 2018-06-18 NOTE — Telephone Encounter (Signed)
Referral to orthopedic was placed, so she can continue following with Dr. Erlinda Hong Thanks, BJ

## 2018-06-18 NOTE — Telephone Encounter (Signed)
It is okay to give verbal authorization for requested services. Thanks, BJ 

## 2018-06-18 NOTE — Telephone Encounter (Signed)
Spoke with the nurse at [facility], informed her that it was okay for patient to see Dr. Erlinda Hong, referral for ortho was placed.

## 2018-06-19 ENCOUNTER — Telehealth: Payer: Self-pay | Admitting: Family Medicine

## 2018-06-19 DIAGNOSIS — F339 Major depressive disorder, recurrent, unspecified: Secondary | ICD-10-CM | POA: Diagnosis not present

## 2018-06-19 DIAGNOSIS — I482 Chronic atrial fibrillation, unspecified: Secondary | ICD-10-CM | POA: Diagnosis not present

## 2018-06-19 DIAGNOSIS — M9702XD Periprosthetic fracture around internal prosthetic left hip joint, subsequent encounter: Secondary | ICD-10-CM | POA: Diagnosis not present

## 2018-06-19 DIAGNOSIS — I10 Essential (primary) hypertension: Secondary | ICD-10-CM | POA: Diagnosis not present

## 2018-06-19 DIAGNOSIS — S7292XD Unspecified fracture of left femur, subsequent encounter for closed fracture with routine healing: Secondary | ICD-10-CM | POA: Diagnosis not present

## 2018-06-19 DIAGNOSIS — J449 Chronic obstructive pulmonary disease, unspecified: Secondary | ICD-10-CM | POA: Diagnosis not present

## 2018-06-19 NOTE — Telephone Encounter (Signed)
Copied from [facility] [id number]. Topic: Quick Communication - Home Health Verbal Orders >> Jun 19, 2018 10:25 AM Bea Graff, NT wrote: Caller/Agency: Nancy/[facility] Number: [phone] Requesting OT/PT/Skilled Nursing/Social Work: OT Frequency: 1 additional visit for shower training

## 2018-06-20 DIAGNOSIS — J449 Chronic obstructive pulmonary disease, unspecified: Secondary | ICD-10-CM | POA: Diagnosis not present

## 2018-06-20 DIAGNOSIS — S7292XD Unspecified fracture of left femur, subsequent encounter for closed fracture with routine healing: Secondary | ICD-10-CM | POA: Diagnosis not present

## 2018-06-20 DIAGNOSIS — I10 Essential (primary) hypertension: Secondary | ICD-10-CM | POA: Diagnosis not present

## 2018-06-20 DIAGNOSIS — M9702XD Periprosthetic fracture around internal prosthetic left hip joint, subsequent encounter: Secondary | ICD-10-CM | POA: Diagnosis not present

## 2018-06-20 DIAGNOSIS — I482 Chronic atrial fibrillation, unspecified: Secondary | ICD-10-CM | POA: Diagnosis not present

## 2018-06-20 DIAGNOSIS — F339 Major depressive disorder, recurrent, unspecified: Secondary | ICD-10-CM | POA: Diagnosis not present

## 2018-06-20 NOTE — Telephone Encounter (Signed)
Left message to give clinic a call back for verbal orders.

## 2018-06-23 ENCOUNTER — Ambulatory Visit (INDEPENDENT_AMBULATORY_CARE_PROVIDER_SITE_OTHER): Payer: Medicare Other | Admitting: Pharmacist

## 2018-06-23 ENCOUNTER — Inpatient Hospital Stay: Payer: Medicare Other | Admitting: Family Medicine

## 2018-06-23 DIAGNOSIS — F339 Major depressive disorder, recurrent, unspecified: Secondary | ICD-10-CM | POA: Diagnosis not present

## 2018-06-23 DIAGNOSIS — I4891 Unspecified atrial fibrillation: Secondary | ICD-10-CM

## 2018-06-23 DIAGNOSIS — Z7901 Long term (current) use of anticoagulants: Secondary | ICD-10-CM | POA: Diagnosis not present

## 2018-06-23 DIAGNOSIS — M9702XD Periprosthetic fracture around internal prosthetic left hip joint, subsequent encounter: Secondary | ICD-10-CM | POA: Diagnosis not present

## 2018-06-23 DIAGNOSIS — S7292XD Unspecified fracture of left femur, subsequent encounter for closed fracture with routine healing: Secondary | ICD-10-CM | POA: Diagnosis not present

## 2018-06-23 DIAGNOSIS — J449 Chronic obstructive pulmonary disease, unspecified: Secondary | ICD-10-CM | POA: Diagnosis not present

## 2018-06-23 DIAGNOSIS — I10 Essential (primary) hypertension: Secondary | ICD-10-CM | POA: Diagnosis not present

## 2018-06-23 DIAGNOSIS — I482 Chronic atrial fibrillation, unspecified: Secondary | ICD-10-CM | POA: Diagnosis not present

## 2018-06-23 LAB — POCT INR: INR: 1.2 — AB (ref 2.0–3.0)

## 2018-06-23 NOTE — Telephone Encounter (Signed)
Verbal orders given to Teresa Frey at [facility] at home as requested.

## 2018-06-25 DIAGNOSIS — I10 Essential (primary) hypertension: Secondary | ICD-10-CM | POA: Diagnosis not present

## 2018-06-25 DIAGNOSIS — J449 Chronic obstructive pulmonary disease, unspecified: Secondary | ICD-10-CM | POA: Diagnosis not present

## 2018-06-25 DIAGNOSIS — M9702XD Periprosthetic fracture around internal prosthetic left hip joint, subsequent encounter: Secondary | ICD-10-CM | POA: Diagnosis not present

## 2018-06-25 DIAGNOSIS — I482 Chronic atrial fibrillation, unspecified: Secondary | ICD-10-CM | POA: Diagnosis not present

## 2018-06-25 DIAGNOSIS — S7292XD Unspecified fracture of left femur, subsequent encounter for closed fracture with routine healing: Secondary | ICD-10-CM | POA: Diagnosis not present

## 2018-06-25 DIAGNOSIS — F339 Major depressive disorder, recurrent, unspecified: Secondary | ICD-10-CM | POA: Diagnosis not present

## 2018-06-26 DIAGNOSIS — S7292XD Unspecified fracture of left femur, subsequent encounter for closed fracture with routine healing: Secondary | ICD-10-CM | POA: Diagnosis not present

## 2018-06-26 DIAGNOSIS — I482 Chronic atrial fibrillation, unspecified: Secondary | ICD-10-CM | POA: Diagnosis not present

## 2018-06-26 DIAGNOSIS — F339 Major depressive disorder, recurrent, unspecified: Secondary | ICD-10-CM | POA: Diagnosis not present

## 2018-06-26 DIAGNOSIS — J449 Chronic obstructive pulmonary disease, unspecified: Secondary | ICD-10-CM | POA: Diagnosis not present

## 2018-06-26 DIAGNOSIS — M9702XD Periprosthetic fracture around internal prosthetic left hip joint, subsequent encounter: Secondary | ICD-10-CM | POA: Diagnosis not present

## 2018-06-26 DIAGNOSIS — I10 Essential (primary) hypertension: Secondary | ICD-10-CM | POA: Diagnosis not present

## 2018-06-27 ENCOUNTER — Ambulatory Visit (INDEPENDENT_AMBULATORY_CARE_PROVIDER_SITE_OTHER): Payer: Medicare Other | Admitting: Cardiovascular Disease

## 2018-06-27 DIAGNOSIS — I4891 Unspecified atrial fibrillation: Secondary | ICD-10-CM | POA: Diagnosis not present

## 2018-06-27 DIAGNOSIS — S7292XD Unspecified fracture of left femur, subsequent encounter for closed fracture with routine healing: Secondary | ICD-10-CM | POA: Diagnosis not present

## 2018-06-27 DIAGNOSIS — J449 Chronic obstructive pulmonary disease, unspecified: Secondary | ICD-10-CM | POA: Diagnosis not present

## 2018-06-27 DIAGNOSIS — Z7901 Long term (current) use of anticoagulants: Secondary | ICD-10-CM | POA: Diagnosis not present

## 2018-06-27 DIAGNOSIS — I482 Chronic atrial fibrillation, unspecified: Secondary | ICD-10-CM | POA: Diagnosis not present

## 2018-06-27 DIAGNOSIS — I10 Essential (primary) hypertension: Secondary | ICD-10-CM | POA: Diagnosis not present

## 2018-06-27 DIAGNOSIS — M9702XD Periprosthetic fracture around internal prosthetic left hip joint, subsequent encounter: Secondary | ICD-10-CM | POA: Diagnosis not present

## 2018-06-27 DIAGNOSIS — F339 Major depressive disorder, recurrent, unspecified: Secondary | ICD-10-CM | POA: Diagnosis not present

## 2018-06-27 LAB — POCT INR: INR: 2.3 (ref 2.0–3.0)

## 2018-06-30 DIAGNOSIS — F339 Major depressive disorder, recurrent, unspecified: Secondary | ICD-10-CM | POA: Diagnosis not present

## 2018-06-30 DIAGNOSIS — I10 Essential (primary) hypertension: Secondary | ICD-10-CM | POA: Diagnosis not present

## 2018-06-30 DIAGNOSIS — I482 Chronic atrial fibrillation, unspecified: Secondary | ICD-10-CM | POA: Diagnosis not present

## 2018-06-30 DIAGNOSIS — S7292XD Unspecified fracture of left femur, subsequent encounter for closed fracture with routine healing: Secondary | ICD-10-CM | POA: Diagnosis not present

## 2018-06-30 DIAGNOSIS — M9702XD Periprosthetic fracture around internal prosthetic left hip joint, subsequent encounter: Secondary | ICD-10-CM | POA: Diagnosis not present

## 2018-06-30 DIAGNOSIS — J449 Chronic obstructive pulmonary disease, unspecified: Secondary | ICD-10-CM | POA: Diagnosis not present

## 2018-07-01 ENCOUNTER — Ambulatory Visit (INDEPENDENT_AMBULATORY_CARE_PROVIDER_SITE_OTHER): Payer: Medicare Other | Admitting: Internal Medicine

## 2018-07-01 DIAGNOSIS — I482 Chronic atrial fibrillation, unspecified: Secondary | ICD-10-CM | POA: Diagnosis not present

## 2018-07-01 DIAGNOSIS — F339 Major depressive disorder, recurrent, unspecified: Secondary | ICD-10-CM | POA: Diagnosis not present

## 2018-07-01 DIAGNOSIS — I10 Essential (primary) hypertension: Secondary | ICD-10-CM | POA: Diagnosis not present

## 2018-07-01 DIAGNOSIS — S7292XD Unspecified fracture of left femur, subsequent encounter for closed fracture with routine healing: Secondary | ICD-10-CM | POA: Diagnosis not present

## 2018-07-01 DIAGNOSIS — Z7901 Long term (current) use of anticoagulants: Secondary | ICD-10-CM

## 2018-07-01 DIAGNOSIS — J449 Chronic obstructive pulmonary disease, unspecified: Secondary | ICD-10-CM | POA: Diagnosis not present

## 2018-07-01 DIAGNOSIS — I4891 Unspecified atrial fibrillation: Secondary | ICD-10-CM

## 2018-07-01 DIAGNOSIS — M9702XD Periprosthetic fracture around internal prosthetic left hip joint, subsequent encounter: Secondary | ICD-10-CM | POA: Diagnosis not present

## 2018-07-01 LAB — POCT INR: INR: 2.4 (ref 2.0–3.0)

## 2018-07-02 DIAGNOSIS — I482 Chronic atrial fibrillation, unspecified: Secondary | ICD-10-CM | POA: Diagnosis not present

## 2018-07-02 DIAGNOSIS — S7292XD Unspecified fracture of left femur, subsequent encounter for closed fracture with routine healing: Secondary | ICD-10-CM | POA: Diagnosis not present

## 2018-07-02 DIAGNOSIS — I10 Essential (primary) hypertension: Secondary | ICD-10-CM | POA: Diagnosis not present

## 2018-07-02 DIAGNOSIS — J449 Chronic obstructive pulmonary disease, unspecified: Secondary | ICD-10-CM | POA: Diagnosis not present

## 2018-07-02 DIAGNOSIS — M9702XD Periprosthetic fracture around internal prosthetic left hip joint, subsequent encounter: Secondary | ICD-10-CM | POA: Diagnosis not present

## 2018-07-02 DIAGNOSIS — F339 Major depressive disorder, recurrent, unspecified: Secondary | ICD-10-CM | POA: Diagnosis not present

## 2018-07-07 DIAGNOSIS — S7292XD Unspecified fracture of left femur, subsequent encounter for closed fracture with routine healing: Secondary | ICD-10-CM | POA: Diagnosis not present

## 2018-07-07 DIAGNOSIS — F339 Major depressive disorder, recurrent, unspecified: Secondary | ICD-10-CM | POA: Diagnosis not present

## 2018-07-07 DIAGNOSIS — I10 Essential (primary) hypertension: Secondary | ICD-10-CM | POA: Diagnosis not present

## 2018-07-07 DIAGNOSIS — I482 Chronic atrial fibrillation, unspecified: Secondary | ICD-10-CM | POA: Diagnosis not present

## 2018-07-07 DIAGNOSIS — J449 Chronic obstructive pulmonary disease, unspecified: Secondary | ICD-10-CM | POA: Diagnosis not present

## 2018-07-07 DIAGNOSIS — M9702XD Periprosthetic fracture around internal prosthetic left hip joint, subsequent encounter: Secondary | ICD-10-CM | POA: Diagnosis not present

## 2018-07-08 ENCOUNTER — Ambulatory Visit (INDEPENDENT_AMBULATORY_CARE_PROVIDER_SITE_OTHER): Payer: Medicare Other | Admitting: Cardiology

## 2018-07-08 DIAGNOSIS — I4891 Unspecified atrial fibrillation: Secondary | ICD-10-CM

## 2018-07-08 DIAGNOSIS — S7292XD Unspecified fracture of left femur, subsequent encounter for closed fracture with routine healing: Secondary | ICD-10-CM | POA: Diagnosis not present

## 2018-07-08 DIAGNOSIS — F339 Major depressive disorder, recurrent, unspecified: Secondary | ICD-10-CM | POA: Diagnosis not present

## 2018-07-08 DIAGNOSIS — I10 Essential (primary) hypertension: Secondary | ICD-10-CM | POA: Diagnosis not present

## 2018-07-08 DIAGNOSIS — M9702XD Periprosthetic fracture around internal prosthetic left hip joint, subsequent encounter: Secondary | ICD-10-CM | POA: Diagnosis not present

## 2018-07-08 DIAGNOSIS — Z7901 Long term (current) use of anticoagulants: Secondary | ICD-10-CM | POA: Diagnosis not present

## 2018-07-08 DIAGNOSIS — I482 Chronic atrial fibrillation, unspecified: Secondary | ICD-10-CM | POA: Diagnosis not present

## 2018-07-08 DIAGNOSIS — J449 Chronic obstructive pulmonary disease, unspecified: Secondary | ICD-10-CM | POA: Diagnosis not present

## 2018-07-08 LAB — POCT INR: INR: 1.6 — AB (ref 2.0–3.0)

## 2018-07-10 ENCOUNTER — Telehealth (INDEPENDENT_AMBULATORY_CARE_PROVIDER_SITE_OTHER): Payer: Self-pay | Admitting: Orthopaedic Surgery

## 2018-07-10 DIAGNOSIS — F339 Major depressive disorder, recurrent, unspecified: Secondary | ICD-10-CM | POA: Diagnosis not present

## 2018-07-10 DIAGNOSIS — J449 Chronic obstructive pulmonary disease, unspecified: Secondary | ICD-10-CM | POA: Diagnosis not present

## 2018-07-10 DIAGNOSIS — I10 Essential (primary) hypertension: Secondary | ICD-10-CM | POA: Diagnosis not present

## 2018-07-10 DIAGNOSIS — S7292XD Unspecified fracture of left femur, subsequent encounter for closed fracture with routine healing: Secondary | ICD-10-CM | POA: Diagnosis not present

## 2018-07-10 DIAGNOSIS — M9702XD Periprosthetic fracture around internal prosthetic left hip joint, subsequent encounter: Secondary | ICD-10-CM | POA: Diagnosis not present

## 2018-07-10 DIAGNOSIS — I482 Chronic atrial fibrillation, unspecified: Secondary | ICD-10-CM | POA: Diagnosis not present

## 2018-07-10 NOTE — Telephone Encounter (Signed)
Called left voicemail - okay on orders.

## 2018-07-10 NOTE — Telephone Encounter (Signed)
Teresa Frey  [phone] [facility] orders  Twice a week for three weeks  Once a week for one week

## 2018-07-14 DIAGNOSIS — J449 Chronic obstructive pulmonary disease, unspecified: Secondary | ICD-10-CM | POA: Diagnosis not present

## 2018-07-14 DIAGNOSIS — F339 Major depressive disorder, recurrent, unspecified: Secondary | ICD-10-CM | POA: Diagnosis not present

## 2018-07-14 DIAGNOSIS — S7292XD Unspecified fracture of left femur, subsequent encounter for closed fracture with routine healing: Secondary | ICD-10-CM | POA: Diagnosis not present

## 2018-07-14 DIAGNOSIS — I482 Chronic atrial fibrillation, unspecified: Secondary | ICD-10-CM | POA: Diagnosis not present

## 2018-07-14 DIAGNOSIS — I10 Essential (primary) hypertension: Secondary | ICD-10-CM | POA: Diagnosis not present

## 2018-07-14 DIAGNOSIS — M9702XD Periprosthetic fracture around internal prosthetic left hip joint, subsequent encounter: Secondary | ICD-10-CM | POA: Diagnosis not present

## 2018-07-15 DIAGNOSIS — Z7901 Long term (current) use of anticoagulants: Secondary | ICD-10-CM | POA: Diagnosis not present

## 2018-07-15 DIAGNOSIS — I10 Essential (primary) hypertension: Secondary | ICD-10-CM | POA: Diagnosis not present

## 2018-07-15 DIAGNOSIS — I482 Chronic atrial fibrillation, unspecified: Secondary | ICD-10-CM | POA: Diagnosis not present

## 2018-07-15 DIAGNOSIS — Z87891 Personal history of nicotine dependence: Secondary | ICD-10-CM | POA: Diagnosis not present

## 2018-07-15 DIAGNOSIS — C50411 Malignant neoplasm of upper-outer quadrant of right female breast: Secondary | ICD-10-CM | POA: Diagnosis not present

## 2018-07-15 DIAGNOSIS — F339 Major depressive disorder, recurrent, unspecified: Secondary | ICD-10-CM | POA: Diagnosis not present

## 2018-07-15 DIAGNOSIS — M9702XD Periprosthetic fracture around internal prosthetic left hip joint, subsequent encounter: Secondary | ICD-10-CM | POA: Diagnosis not present

## 2018-07-15 DIAGNOSIS — J449 Chronic obstructive pulmonary disease, unspecified: Secondary | ICD-10-CM | POA: Diagnosis not present

## 2018-07-15 DIAGNOSIS — Z86010 Personal history of colonic polyps: Secondary | ICD-10-CM | POA: Diagnosis not present

## 2018-07-15 DIAGNOSIS — S7292XD Unspecified fracture of left femur, subsequent encounter for closed fracture with routine healing: Secondary | ICD-10-CM | POA: Diagnosis not present

## 2018-07-15 DIAGNOSIS — Z9181 History of falling: Secondary | ICD-10-CM | POA: Diagnosis not present

## 2018-07-15 DIAGNOSIS — F419 Anxiety disorder, unspecified: Secondary | ICD-10-CM | POA: Diagnosis not present

## 2018-07-15 DIAGNOSIS — D500 Iron deficiency anemia secondary to blood loss (chronic): Secondary | ICD-10-CM | POA: Diagnosis not present

## 2018-07-18 ENCOUNTER — Ambulatory Visit (INDEPENDENT_AMBULATORY_CARE_PROVIDER_SITE_OTHER): Payer: Medicare Other | Admitting: *Deleted

## 2018-07-18 DIAGNOSIS — S7292XD Unspecified fracture of left femur, subsequent encounter for closed fracture with routine healing: Secondary | ICD-10-CM | POA: Diagnosis not present

## 2018-07-18 DIAGNOSIS — Z7901 Long term (current) use of anticoagulants: Secondary | ICD-10-CM | POA: Diagnosis not present

## 2018-07-18 DIAGNOSIS — F339 Major depressive disorder, recurrent, unspecified: Secondary | ICD-10-CM | POA: Diagnosis not present

## 2018-07-18 DIAGNOSIS — I482 Chronic atrial fibrillation, unspecified: Secondary | ICD-10-CM | POA: Diagnosis not present

## 2018-07-18 DIAGNOSIS — I10 Essential (primary) hypertension: Secondary | ICD-10-CM | POA: Diagnosis not present

## 2018-07-18 DIAGNOSIS — J449 Chronic obstructive pulmonary disease, unspecified: Secondary | ICD-10-CM | POA: Diagnosis not present

## 2018-07-18 DIAGNOSIS — M9702XD Periprosthetic fracture around internal prosthetic left hip joint, subsequent encounter: Secondary | ICD-10-CM | POA: Diagnosis not present

## 2018-07-18 DIAGNOSIS — I4891 Unspecified atrial fibrillation: Secondary | ICD-10-CM | POA: Diagnosis not present

## 2018-07-18 LAB — POCT INR: INR: 3.1 — AB (ref 2.0–3.0)

## 2018-07-18 NOTE — Patient Instructions (Signed)
Description   Today take only 5mg , then resume taking 10mg  daily except for 15mg  every Tuesdays and Thursdays.. Repeat INR in 10 days.

## 2018-07-21 DIAGNOSIS — I482 Chronic atrial fibrillation, unspecified: Secondary | ICD-10-CM | POA: Diagnosis not present

## 2018-07-21 DIAGNOSIS — I10 Essential (primary) hypertension: Secondary | ICD-10-CM | POA: Diagnosis not present

## 2018-07-21 DIAGNOSIS — F339 Major depressive disorder, recurrent, unspecified: Secondary | ICD-10-CM | POA: Diagnosis not present

## 2018-07-21 DIAGNOSIS — J449 Chronic obstructive pulmonary disease, unspecified: Secondary | ICD-10-CM | POA: Diagnosis not present

## 2018-07-21 DIAGNOSIS — M9702XD Periprosthetic fracture around internal prosthetic left hip joint, subsequent encounter: Secondary | ICD-10-CM | POA: Diagnosis not present

## 2018-07-21 DIAGNOSIS — S7292XD Unspecified fracture of left femur, subsequent encounter for closed fracture with routine healing: Secondary | ICD-10-CM | POA: Diagnosis not present

## 2018-07-22 ENCOUNTER — Ambulatory Visit (INDEPENDENT_AMBULATORY_CARE_PROVIDER_SITE_OTHER): Payer: Medicare Other | Admitting: Orthopaedic Surgery

## 2018-07-22 ENCOUNTER — Ambulatory Visit (INDEPENDENT_AMBULATORY_CARE_PROVIDER_SITE_OTHER): Payer: Medicare Other

## 2018-07-22 DIAGNOSIS — M9702XD Periprosthetic fracture around internal prosthetic left hip joint, subsequent encounter: Secondary | ICD-10-CM

## 2018-07-22 DIAGNOSIS — M1712 Unilateral primary osteoarthritis, left knee: Secondary | ICD-10-CM | POA: Diagnosis not present

## 2018-07-22 MED ORDER — METHYLPREDNISOLONE ACETATE 40 MG/ML IJ SUSP
40.0000 mg | INTRAMUSCULAR | Status: AC | PRN
  Administered 2018-07-22: 40 mg via INTRA_ARTICULAR

## 2018-07-22 MED ORDER — BUPIVACAINE HCL 0.5 % IJ SOLN
2.0000 mL | INTRAMUSCULAR | Status: AC | PRN
  Administered 2018-07-22: 2 mL via INTRA_ARTICULAR

## 2018-07-22 MED ORDER — LIDOCAINE HCL 1 % IJ SOLN
2.0000 mL | INTRAMUSCULAR | Status: AC | PRN
  Administered 2018-07-22: 2 mL

## 2018-07-22 NOTE — Progress Notes (Signed)
Office Visit Note   Patient: Teresa Frey           Date of Birth: [birth year]           MRN: [id number] Visit Date: 07/22/2018              Requested by: [name], [name], MD [address] [place], [place] [address] PCP: [name], [name], MD   Assessment & Plan: Visit Diagnoses:  1. Periprosthetic fracture around internal prosthetic left hip joint, subsequent encounter   2. Primary osteoarthritis of left knee     Plan: Overall Teresa Frey is coming along well.  I would like to obtain a bone stimulator for her at this point since there is still persistent fracture lucency of her femur fracture.  There is abundant callus formation also so I do think that she is slowly healing this fracture.  Clinically she is doing well.  She is complaining more about her left knee pain which I think is related to degenerative joint disease.  We performed a cortisone injection today for the left knee.  I would like to see her back in 6 weeks with repeat 2 view x-rays of the left femur.  She needs to be out of work for at least another 6 weeks.  Follow-Up Instructions: Return in about 6 weeks (around 09/02/2018).   Orders:  Orders Placed This Encounter  Procedures  . XR FEMUR MIN 2 VIEWS LEFT   No orders of the defined types were placed in this encounter.     Procedures: Large Joint Inj: L knee on 07/22/2018 9:27 AM Details: 22 G needle Medications: 2 mL bupivacaine 0.5 %; 2 mL lidocaine 1 %; 40 mg methylPREDNISolone acetate 40 MG/ML Outcome: tolerated well, no immediate complications Patient was prepped and draped in the usual sterile fashion.       Clinical Data: No additional findings.   Subjective: Chief Complaint  Patient presents with  . Left Hip - Pain    LEFT FEMUR    Teresa Frey is a 85 year old female who is 92 days status post ORIF left periprosthetic femur fracture.  She is receiving in-home physical therapy twice a week.  She continues with home exercises.  She walks with a cane  at home and with a walker in public.  She is mainly reporting left knee pain which is a unrelated issue.  She endorses start up stiffness and pain mainly on the medial side.   Review of Systems   Objective: Vital Signs: There were no vitals taken for this visit.  Physical Exam  Ortho Exam Left thigh exam shows a fully healed surgical scar.  There is mild swelling.  No significant joint effusion.  Mild pain with knee range of motion. Specialty Comments:  No specialty comments available.  Imaging: Xr Femur Min 2 Views Left  Result Date: 07/22/2018 Stable alignment of periprosthetic femur fracture.  There is still persistent lucency of some of the fracture lines.  There is abundant callus formation.  There is no hardware complication.  Advanced medial compartment degenerative joint disease left knee    PMFS History: Patient Active Problem List   Diagnosis Date Noted  . Periprosthetic fracture around internal prosthetic left hip joint ([place]) 04/19/2018  . Long term (current) use of anticoagulants [Z79.01] 08/14/2017  . Genetic testing 04/01/2016  . possible sleep apnea by history 02/12/2016  . A-fib ([place]) 02/11/2016  . Surgical wound infection 02/11/2016  . Breast cancer of upper-outer quadrant of right female breast (  [place]) 01/11/2016  . Impaired glucose tolerance 07/28/2014  . Anxiety disorder, unspecified 04/07/2007  . Osteoarthritis 04/07/2007  . Essential hypertension 04/02/2007  . History of colonic polyps 04/02/2007  . DIVERTICULITIS, HX OF 04/02/2007   Past Medical History:  Diagnosis Date  . ANXIETY 04/07/2007  . Breast cancer of upper-outer quadrant of right female breast ([place] [facility]) 01/11/2016  . COLONIC POLYPS, HX OF 04/02/2007   adenomatous  . DIVERTICULITIS, HX OF 04/02/2007  . Diverticulosis   . DJD (degenerative joint disease)   . Hemorrhoids   . HYPERTENSION 04/02/2007  . OSTEOARTHRITIS, SEVERE 04/07/2007  . 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 WEIGHT  GAIN 11/13/2007    Family History  Problem Relation Age of Onset  . Pancreatic cancer Mother 86       d. 29  . Cancer Father 109       hodgkin's lymphoma and leukemia; d. 16  . Colon cancer Brother        dx. late 64s; smoker  . Pancreatic cancer Brother 80       d. 65; smoker  . Lung cancer Brother 40       smoker  . Heart attack Brother        d. late 65s  . Colon cancer Paternal Aunt   . Breast cancer Daughter 42       negative genetic testing in 2016  . Skin cancer Daughter        non-melanoma type; +sun exposure  . Kidney failure Son 88       +EtOH abuse resulting in liver, kidney, and pancreas failure  . Diabetes Maternal Uncle        d. later age  . Heart disease Neg Hx        family  . Esophageal cancer Neg Hx   . Rectal cancer Neg Hx   . Stomach cancer Neg Hx     Past Surgical History:  Procedure Laterality Date  . APPENDECTOMY    . BREAST LUMPECTOMY WITH RADIOACTIVE SEED LOCALIZATION Right 02/08/2016   Procedure: RIGHT BREAST LUMPECTOMY WITH RADIOACTIVE SEED LOCALIZATION;  Surgeon: Fanny Skates, MD;  Location: [facility];  Service: General;  Laterality: Right;  . ORIF FEMUR FRACTURE Left 04/21/2018   Procedure: OPEN REDUCTION INTERNAL FIXATION (ORIF) PERI PROSTHETIC FEMUR FRACTURE;  Surgeon: Leandrew Koyanagi, MD;  Location: [facility];  Service: Orthopedics;  Laterality: Left;  . TONSILLECTOMY    . TOTAL HIP ARTHROPLASTY  1999, left hip    2006 right hip  . TUBAL LIGATION     Social History   Occupational History  . Not on file  Tobacco Use  . Smoking status: Former Smoker    Packs/day: 1.00    Years: 27.00    Pack years: 27.00    Types: Cigarettes    Last attempt to quit: 06/04/1978    Years since quitting: 40.1  . Smokeless tobacco: Never Used  Substance and Sexual Activity  . Alcohol use: Yes    Alcohol/week: 7.0 standard drinks    Types: 7 Glasses of wine per week    Comment: 1-2 glasses wine after dinner, but not necessarily each day  .  Drug use: No  . Sexual activity: Not on file

## 2018-07-25 DIAGNOSIS — M9702XD Periprosthetic fracture around internal prosthetic left hip joint, subsequent encounter: Secondary | ICD-10-CM | POA: Diagnosis not present

## 2018-07-25 DIAGNOSIS — J449 Chronic obstructive pulmonary disease, unspecified: Secondary | ICD-10-CM | POA: Diagnosis not present

## 2018-07-25 DIAGNOSIS — I10 Essential (primary) hypertension: Secondary | ICD-10-CM | POA: Diagnosis not present

## 2018-07-25 DIAGNOSIS — I482 Chronic atrial fibrillation, unspecified: Secondary | ICD-10-CM | POA: Diagnosis not present

## 2018-07-25 DIAGNOSIS — F339 Major depressive disorder, recurrent, unspecified: Secondary | ICD-10-CM | POA: Diagnosis not present

## 2018-07-25 DIAGNOSIS — S7292XD Unspecified fracture of left femur, subsequent encounter for closed fracture with routine healing: Secondary | ICD-10-CM | POA: Diagnosis not present

## 2018-07-28 ENCOUNTER — Ambulatory Visit (INDEPENDENT_AMBULATORY_CARE_PROVIDER_SITE_OTHER): Payer: Medicare Other | Admitting: Pharmacist Clinician (PhC)/ Clinical Pharmacy Specialist

## 2018-07-28 DIAGNOSIS — Z7901 Long term (current) use of anticoagulants: Secondary | ICD-10-CM

## 2018-07-28 DIAGNOSIS — I4891 Unspecified atrial fibrillation: Secondary | ICD-10-CM | POA: Diagnosis not present

## 2018-07-28 LAB — POCT INR: INR: 2.1 (ref 2.0–3.0)

## 2018-07-29 ENCOUNTER — Ambulatory Visit: Payer: Medicare Other | Admitting: Cardiology

## 2018-07-29 DIAGNOSIS — I10 Essential (primary) hypertension: Secondary | ICD-10-CM | POA: Diagnosis not present

## 2018-07-29 DIAGNOSIS — M9702XD Periprosthetic fracture around internal prosthetic left hip joint, subsequent encounter: Secondary | ICD-10-CM | POA: Diagnosis not present

## 2018-07-29 DIAGNOSIS — J449 Chronic obstructive pulmonary disease, unspecified: Secondary | ICD-10-CM | POA: Diagnosis not present

## 2018-07-29 DIAGNOSIS — S7292XD Unspecified fracture of left femur, subsequent encounter for closed fracture with routine healing: Secondary | ICD-10-CM | POA: Diagnosis not present

## 2018-07-29 DIAGNOSIS — F339 Major depressive disorder, recurrent, unspecified: Secondary | ICD-10-CM | POA: Diagnosis not present

## 2018-07-29 DIAGNOSIS — I482 Chronic atrial fibrillation, unspecified: Secondary | ICD-10-CM | POA: Diagnosis not present

## 2018-07-30 DIAGNOSIS — I10 Essential (primary) hypertension: Secondary | ICD-10-CM | POA: Diagnosis not present

## 2018-07-30 DIAGNOSIS — J449 Chronic obstructive pulmonary disease, unspecified: Secondary | ICD-10-CM | POA: Diagnosis not present

## 2018-07-30 DIAGNOSIS — M9702XD Periprosthetic fracture around internal prosthetic left hip joint, subsequent encounter: Secondary | ICD-10-CM | POA: Diagnosis not present

## 2018-07-30 DIAGNOSIS — I482 Chronic atrial fibrillation, unspecified: Secondary | ICD-10-CM | POA: Diagnosis not present

## 2018-07-30 DIAGNOSIS — S7292XD Unspecified fracture of left femur, subsequent encounter for closed fracture with routine healing: Secondary | ICD-10-CM | POA: Diagnosis not present

## 2018-07-30 DIAGNOSIS — F339 Major depressive disorder, recurrent, unspecified: Secondary | ICD-10-CM | POA: Diagnosis not present

## 2018-08-01 DIAGNOSIS — D485 Neoplasm of uncertain behavior of skin: Secondary | ICD-10-CM | POA: Diagnosis not present

## 2018-08-01 DIAGNOSIS — C4441 Basal cell carcinoma of skin of scalp and neck: Secondary | ICD-10-CM | POA: Diagnosis not present

## 2018-08-01 DIAGNOSIS — Z23 Encounter for immunization: Secondary | ICD-10-CM | POA: Diagnosis not present

## 2018-08-01 DIAGNOSIS — C44319 Basal cell carcinoma of skin of other parts of face: Secondary | ICD-10-CM | POA: Diagnosis not present

## 2018-08-01 DIAGNOSIS — L821 Other seborrheic keratosis: Secondary | ICD-10-CM | POA: Diagnosis not present

## 2018-08-04 ENCOUNTER — Telehealth (INDEPENDENT_AMBULATORY_CARE_PROVIDER_SITE_OTHER): Payer: Self-pay | Admitting: Orthopaedic Surgery

## 2018-08-04 DIAGNOSIS — M9702XD Periprosthetic fracture around internal prosthetic left hip joint, subsequent encounter: Secondary | ICD-10-CM | POA: Diagnosis not present

## 2018-08-04 DIAGNOSIS — F339 Major depressive disorder, recurrent, unspecified: Secondary | ICD-10-CM | POA: Diagnosis not present

## 2018-08-04 DIAGNOSIS — J449 Chronic obstructive pulmonary disease, unspecified: Secondary | ICD-10-CM | POA: Diagnosis not present

## 2018-08-04 DIAGNOSIS — I482 Chronic atrial fibrillation, unspecified: Secondary | ICD-10-CM | POA: Diagnosis not present

## 2018-08-04 DIAGNOSIS — S7292XD Unspecified fracture of left femur, subsequent encounter for closed fracture with routine healing: Secondary | ICD-10-CM | POA: Diagnosis not present

## 2018-08-04 DIAGNOSIS — I10 Essential (primary) hypertension: Secondary | ICD-10-CM | POA: Diagnosis not present

## 2018-08-04 NOTE — Telephone Encounter (Signed)
Teresa Frey is requesting verbal orders for  2x a week for 3 weeks  1x a week for 1 week

## 2018-08-04 NOTE — Telephone Encounter (Signed)
Called Anda Kraft no answer LMOM approved orders.

## 2018-08-07 DIAGNOSIS — F339 Major depressive disorder, recurrent, unspecified: Secondary | ICD-10-CM | POA: Diagnosis not present

## 2018-08-07 DIAGNOSIS — S7292XD Unspecified fracture of left femur, subsequent encounter for closed fracture with routine healing: Secondary | ICD-10-CM | POA: Diagnosis not present

## 2018-08-07 DIAGNOSIS — I10 Essential (primary) hypertension: Secondary | ICD-10-CM | POA: Diagnosis not present

## 2018-08-07 DIAGNOSIS — J449 Chronic obstructive pulmonary disease, unspecified: Secondary | ICD-10-CM | POA: Diagnosis not present

## 2018-08-07 DIAGNOSIS — I482 Chronic atrial fibrillation, unspecified: Secondary | ICD-10-CM | POA: Diagnosis not present

## 2018-08-07 DIAGNOSIS — M9702XD Periprosthetic fracture around internal prosthetic left hip joint, subsequent encounter: Secondary | ICD-10-CM | POA: Diagnosis not present

## 2018-08-10 NOTE — Progress Notes (Signed)
Cardiology Office Note   Date:  08/11/2018   ID:  Teresa Frey, DOB 1929/06/06, MRN [id number]  PCP:  [name], [name], MD  Cardiologist:   No primary care provider on file.   Chief Complaint  Patient presents with  . Atrial Fibrillation      History of Present Illness: Teresa Frey is a 85 y.o. female who presents for follow up of atrial fib.  She has been in the [facility] for treatment of paroxysmal atrial fib but not in the last year.  She presents for yearly follow up.  She had a broken hip since I last saw her.  I reviewed these records and there were no arrhythmias noted during this long hospitalization and rehab.  She does not feel any palpitations, presyncope or syncope.  She has no chest pressure, neck or arm discomfort.  She is getting around with a cane with still significant discomfort associated with this periprosthetic femur fracture.   Past Medical History:  Diagnosis Date  . ANXIETY 04/07/2007  . Breast cancer of upper-outer quadrant of right female breast ([place]) 01/11/2016  . COLONIC POLYPS, HX OF 04/02/2007   adenomatous  . DIVERTICULITIS, HX OF 04/02/2007  . Diverticulosis   . DJD (degenerative joint disease)   . Hemorrhoids   . HYPERTENSION 04/02/2007  . OSTEOARTHRITIS, SEVERE 04/07/2007  . 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 WEIGHT GAIN 11/13/2007    Past Surgical History:  Procedure Laterality Date  . APPENDECTOMY    . BREAST LUMPECTOMY WITH RADIOACTIVE SEED LOCALIZATION Right 02/08/2016   Procedure: RIGHT BREAST LUMPECTOMY WITH RADIOACTIVE SEED LOCALIZATION;  Surgeon: Fanny Skates, MD;  Location: [facility];  Service: General;  Laterality: Right;  . ORIF FEMUR FRACTURE Left 04/21/2018   Procedure: OPEN REDUCTION INTERNAL FIXATION (ORIF) PERI PROSTHETIC FEMUR FRACTURE;  Surgeon: Leandrew Koyanagi, MD;  Location: [facility];  Service: Orthopedics;  Laterality: Left;  . TONSILLECTOMY    . TOTAL HIP ARTHROPLASTY  1999, left hip    2006 right hip  . TUBAL LIGATION       Current Outpatient Medications  Medication Sig Dispense Refill  . acetaminophen (TYLENOL) 325 MG tablet Take 1-2 tablets (325-650 mg total) by mouth every 6 (six) hours as needed for mild pain (pain score 1-3 or temp > 100.5).    Marland Kitchen albuterol (PROVENTIL HFA;VENTOLIN HFA) 108 (90 Base) MCG/ACT inhaler Inhale 2 puffs into the lungs every 6 (six) hours as needed for wheezing or shortness of breath. 1 Inhaler 0  . anastrozole (ARIMIDEX) 1 MG tablet TAKE 1 TABLET BY MOUTH DAILY 90 tablet 3  . Ascorbic Acid (VITAMIN C) 1000 MG tablet Take 1,000 mg by mouth daily.    . benzonatate (TESSALON) 100 MG capsule Take 1 capsule (100 mg total) by mouth every 8 (eight) hours. 21 capsule 0  . BIOTIN 5000 PO Take 1 tablet by mouth daily.    . Calcium Carb-Cholecalciferol (CALCIUM 1000 + D PO) Take 1 tablet by mouth daily.    . calcium-vitamin D (OSCAL WITH D) 500-200 MG-UNIT tablet Take 1 tablet by mouth 3 (three) times daily. 90 tablet 12  . docusate sodium (COLACE) 100 MG capsule Take 1 capsule (100 mg total) by mouth 2 (two) times daily. 10 capsule 0  . hydrochlorothiazide (MICROZIDE) 12.5 MG capsule TAKE 1 CAPSULE BY MOUTH DAILY. (Patient taking differently: Take 12.5 mg by mouth daily. ) 30 capsule 3  . metoprolol tartrate (  LOPRESSOR) 25 MG tablet TAKE 1 TABLET BY MOUTH DAILY. 30 tablet 1  . Multiple Vitamins-Minerals (CENTRUM SILVER ADULT 50+) TABS Take 1 tablet by mouth daily.    Marland Kitchen omega-3 acid ethyl esters (LOVAZA) 1 g capsule Take 1 g by mouth daily.     Marland Kitchen oxyCODONE-acetaminophen (PERCOCET) 5-325 MG tablet Take 1-2 tablets by mouth every 4 (four) hours as needed for severe pain. 30 tablet 0  . polyethylene glycol (MIRALAX / GLYCOLAX) packet Take 17 g by mouth daily as needed for mild constipation. 14 each 0  . predniSONE (DELTASONE) 20 MG tablet 2 tabs po daily x 4 days 8 tablet 0  . sertraline (ZOLOFT) 50 MG tablet Take 1 tablet (50 mg total) by mouth daily. 90  tablet 2  . vitamin B-12 (CYANOCOBALAMIN) 1000 MCG tablet Take 1,000 mcg by mouth daily.    Marland Kitchen warfarin (COUMADIN) 5 MG tablet Take 15mg  (3 tablets) on Tuesdays and Thursdays, take 10 mg (2 tablets) all other days of the week or as directed by coumadin clinic (Patient taking differently: Take 10-15 mg by mouth See admin instructions. Take 15mg  (3 tablets) on Tuesdays and Thursdays, take 10 mg (2 tablets) all other days of the week) 90 tablet 4  . zinc sulfate 220 (50 Zn) MG capsule Take 1 capsule (220 mg total) by mouth daily. 42 capsule 0   No current facility-administered medications for this visit.     Allergies:   Tramadol    ROS:  Please see the history of present illness.   Otherwise, review of systems are positive for none.   All other systems are reviewed and negative.    PHYSICAL EXAM: VS:  BP 138/74   Pulse (!) 56   Ht 5\' 2"  (1.575 m)   Wt 161 lb 12.8 oz (73.4 kg)   BMI 29.59 kg/m  , BMI Body mass index is 29.59 kg/m. GENERAL:  Well appearing NECK:  No jugular venous distention, waveform within normal limits, carotid upstroke brisk and symmetric, no bruits, no thyromegaly LUNGS:  Clear to auscultation bilaterally CHEST:  Unremarkable HEART:  PMI not displaced or sustained,S1 and S2 within normal limits, no S3, no S4, no clicks, no rubs, no murmurs ABD:  Flat, positive bowel sounds normal in frequency in pitch, no bruits, no rebound, no guarding, no midline pulsatile mass, no hepatomegaly, no splenomegaly EXT:  2 plus pulses throughout, left thigh edema, no cyanosis no clubbing   EKG:  EKG is not  ordered today. NA   Recent Labs: 04/19/2018: ALT 16 06/17/2018: BUN 14; Creatinine, Ser 0.77; Hemoglobin 13.2; Platelets 232; Potassium 3.9; Sodium 141    Lipid Panel    Component Value Date/Time   CHOL 200 10/06/2015 0846   TRIG 133.0 10/06/2015 0846   TRIG 109 04/02/2006 1019   HDL 53.20 10/06/2015 0846   CHOLHDL 4 10/06/2015 0846   VLDL 26.6 10/06/2015 0846    LDLCALC 121 (H) 10/06/2015 0846   LDLDIRECT 126.1 06/12/2013 0949      Wt Readings from Last 3 Encounters:  08/11/18 161 lb 12.8 oz (73.4 kg)  06/17/18 159 lb (72.1 kg)  04/21/18 161 lb (73 kg)      Other studies Reviewed: Additional studies/ records that were reviewed today include: Hospital records Review of the above records demonstrates:     ASSESSMENT AND PLAN:   Atrial Fib:  Teresa Frey has a CHA2DS2 - VASc score of 4.   She could not afford DOAC and is on  warfarin.   She tolerates this.  No change in therapy.    Current medicines are reviewed at length with the patient today.  The patient has concerns regarding medicines.  The following changes have been made:  none  Labs/ tests ordered today include: None No orders of the defined types were placed in this encounter.    Disposition:   FU with me in 12 months.     Signed, [name], MD  08/11/2018 1:07 PM    [place]

## 2018-08-11 ENCOUNTER — Ambulatory Visit (INDEPENDENT_AMBULATORY_CARE_PROVIDER_SITE_OTHER): Payer: Medicare Other | Admitting: *Deleted

## 2018-08-11 ENCOUNTER — Ambulatory Visit (INDEPENDENT_AMBULATORY_CARE_PROVIDER_SITE_OTHER): Payer: Medicare Other | Admitting: Cardiology

## 2018-08-11 ENCOUNTER — Encounter: Payer: Self-pay | Admitting: Cardiology

## 2018-08-11 VITALS — BP 138/74 | HR 56 | Ht 62.0 in | Wt 161.8 lb

## 2018-08-11 DIAGNOSIS — S7292XD Unspecified fracture of left femur, subsequent encounter for closed fracture with routine healing: Secondary | ICD-10-CM | POA: Diagnosis not present

## 2018-08-11 DIAGNOSIS — I4891 Unspecified atrial fibrillation: Secondary | ICD-10-CM | POA: Diagnosis not present

## 2018-08-11 DIAGNOSIS — Z7901 Long term (current) use of anticoagulants: Secondary | ICD-10-CM | POA: Diagnosis not present

## 2018-08-11 DIAGNOSIS — J449 Chronic obstructive pulmonary disease, unspecified: Secondary | ICD-10-CM | POA: Diagnosis not present

## 2018-08-11 DIAGNOSIS — I482 Chronic atrial fibrillation, unspecified: Secondary | ICD-10-CM | POA: Diagnosis not present

## 2018-08-11 DIAGNOSIS — I10 Essential (primary) hypertension: Secondary | ICD-10-CM | POA: Diagnosis not present

## 2018-08-11 DIAGNOSIS — M9702XD Periprosthetic fracture around internal prosthetic left hip joint, subsequent encounter: Secondary | ICD-10-CM | POA: Diagnosis not present

## 2018-08-11 DIAGNOSIS — I480 Paroxysmal atrial fibrillation: Secondary | ICD-10-CM

## 2018-08-11 DIAGNOSIS — F339 Major depressive disorder, recurrent, unspecified: Secondary | ICD-10-CM | POA: Diagnosis not present

## 2018-08-11 LAB — POCT INR: INR: 3.4 — AB (ref 2.0–3.0)

## 2018-08-11 NOTE — Patient Instructions (Signed)
Description   Skip today's dose then Continue 10mg  daily except for 15mg  every Tuesdays and Thursdays.. Repeat INR in 2 week.

## 2018-08-11 NOTE — Patient Instructions (Signed)
Medication Instructions:  Continue current medications  If you need a refill on your cardiac medications before your next appointment, please call your pharmacy.  Labwork: None Ordered   Testing/Procedures: None Ordered  Follow-Up: You will need a follow up appointment in 1 Year.  Please call our office 2 months in advance to schedule this appointment.  You may see Dr Hochrein or one of the following Advanced Practice Providers on your designated Care Team:   Rhonda Barrett, PA-C . Kathryn Lawrence, DNP, ANP   At [facility], you and your health needs are our priority.  As part of our continuing mission to provide you with exceptional heart care, we have created designated Provider Care Teams.  These Care Teams include your primary Cardiologist (physician) and Advanced Practice Providers (APPs -  Physician Assistants and Nurse Practitioners) who all work together to provide you with the care you need, when you need it.  Thank you for choosing [facility] at [facility]!!     

## 2018-08-14 DIAGNOSIS — I10 Essential (primary) hypertension: Secondary | ICD-10-CM | POA: Diagnosis not present

## 2018-08-14 DIAGNOSIS — Z86010 Personal history of colonic polyps: Secondary | ICD-10-CM | POA: Diagnosis not present

## 2018-08-14 DIAGNOSIS — Z9181 History of falling: Secondary | ICD-10-CM | POA: Diagnosis not present

## 2018-08-14 DIAGNOSIS — J449 Chronic obstructive pulmonary disease, unspecified: Secondary | ICD-10-CM | POA: Diagnosis not present

## 2018-08-14 DIAGNOSIS — M9702XD Periprosthetic fracture around internal prosthetic left hip joint, subsequent encounter: Secondary | ICD-10-CM | POA: Diagnosis not present

## 2018-08-14 DIAGNOSIS — S7292XD Unspecified fracture of left femur, subsequent encounter for closed fracture with routine healing: Secondary | ICD-10-CM | POA: Diagnosis not present

## 2018-08-14 DIAGNOSIS — Z7901 Long term (current) use of anticoagulants: Secondary | ICD-10-CM | POA: Diagnosis not present

## 2018-08-14 DIAGNOSIS — D500 Iron deficiency anemia secondary to blood loss (chronic): Secondary | ICD-10-CM | POA: Diagnosis not present

## 2018-08-14 DIAGNOSIS — F339 Major depressive disorder, recurrent, unspecified: Secondary | ICD-10-CM | POA: Diagnosis not present

## 2018-08-14 DIAGNOSIS — Z853 Personal history of malignant neoplasm of breast: Secondary | ICD-10-CM | POA: Diagnosis not present

## 2018-08-14 DIAGNOSIS — F419 Anxiety disorder, unspecified: Secondary | ICD-10-CM | POA: Diagnosis not present

## 2018-08-14 DIAGNOSIS — I482 Chronic atrial fibrillation, unspecified: Secondary | ICD-10-CM | POA: Diagnosis not present

## 2018-08-14 DIAGNOSIS — Z87891 Personal history of nicotine dependence: Secondary | ICD-10-CM | POA: Diagnosis not present

## 2018-08-15 DIAGNOSIS — S7292XD Unspecified fracture of left femur, subsequent encounter for closed fracture with routine healing: Secondary | ICD-10-CM | POA: Diagnosis not present

## 2018-08-15 DIAGNOSIS — F339 Major depressive disorder, recurrent, unspecified: Secondary | ICD-10-CM | POA: Diagnosis not present

## 2018-08-15 DIAGNOSIS — J449 Chronic obstructive pulmonary disease, unspecified: Secondary | ICD-10-CM | POA: Diagnosis not present

## 2018-08-15 DIAGNOSIS — M9702XD Periprosthetic fracture around internal prosthetic left hip joint, subsequent encounter: Secondary | ICD-10-CM | POA: Diagnosis not present

## 2018-08-15 DIAGNOSIS — I482 Chronic atrial fibrillation, unspecified: Secondary | ICD-10-CM | POA: Diagnosis not present

## 2018-08-15 DIAGNOSIS — I10 Essential (primary) hypertension: Secondary | ICD-10-CM | POA: Diagnosis not present

## 2018-08-19 ENCOUNTER — Other Ambulatory Visit: Payer: Self-pay | Admitting: Cardiology

## 2018-08-19 DIAGNOSIS — F339 Major depressive disorder, recurrent, unspecified: Secondary | ICD-10-CM | POA: Diagnosis not present

## 2018-08-19 DIAGNOSIS — M9702XD Periprosthetic fracture around internal prosthetic left hip joint, subsequent encounter: Secondary | ICD-10-CM | POA: Diagnosis not present

## 2018-08-19 DIAGNOSIS — I10 Essential (primary) hypertension: Secondary | ICD-10-CM | POA: Diagnosis not present

## 2018-08-19 DIAGNOSIS — S7292XD Unspecified fracture of left femur, subsequent encounter for closed fracture with routine healing: Secondary | ICD-10-CM | POA: Diagnosis not present

## 2018-08-19 DIAGNOSIS — I482 Chronic atrial fibrillation, unspecified: Secondary | ICD-10-CM | POA: Diagnosis not present

## 2018-08-19 DIAGNOSIS — J449 Chronic obstructive pulmonary disease, unspecified: Secondary | ICD-10-CM | POA: Diagnosis not present

## 2018-08-21 DIAGNOSIS — S7292XD Unspecified fracture of left femur, subsequent encounter for closed fracture with routine healing: Secondary | ICD-10-CM | POA: Diagnosis not present

## 2018-08-21 DIAGNOSIS — M9702XD Periprosthetic fracture around internal prosthetic left hip joint, subsequent encounter: Secondary | ICD-10-CM | POA: Diagnosis not present

## 2018-08-21 DIAGNOSIS — I10 Essential (primary) hypertension: Secondary | ICD-10-CM | POA: Diagnosis not present

## 2018-08-21 DIAGNOSIS — J449 Chronic obstructive pulmonary disease, unspecified: Secondary | ICD-10-CM | POA: Diagnosis not present

## 2018-08-21 DIAGNOSIS — F339 Major depressive disorder, recurrent, unspecified: Secondary | ICD-10-CM | POA: Diagnosis not present

## 2018-08-21 DIAGNOSIS — I482 Chronic atrial fibrillation, unspecified: Secondary | ICD-10-CM | POA: Diagnosis not present

## 2018-08-22 ENCOUNTER — Telehealth: Payer: Self-pay | Admitting: *Deleted

## 2018-08-22 NOTE — Telephone Encounter (Signed)

## 2018-08-25 ENCOUNTER — Other Ambulatory Visit: Payer: Self-pay

## 2018-08-25 ENCOUNTER — Ambulatory Visit (INDEPENDENT_AMBULATORY_CARE_PROVIDER_SITE_OTHER): Payer: Medicare Other | Admitting: *Deleted

## 2018-08-25 DIAGNOSIS — Z7901 Long term (current) use of anticoagulants: Secondary | ICD-10-CM | POA: Diagnosis not present

## 2018-08-25 DIAGNOSIS — I4891 Unspecified atrial fibrillation: Secondary | ICD-10-CM | POA: Diagnosis not present

## 2018-08-25 LAB — POCT INR: INR: 3.5 — AB (ref 2.0–3.0)

## 2018-08-26 DIAGNOSIS — J449 Chronic obstructive pulmonary disease, unspecified: Secondary | ICD-10-CM | POA: Diagnosis not present

## 2018-08-26 DIAGNOSIS — I10 Essential (primary) hypertension: Secondary | ICD-10-CM | POA: Diagnosis not present

## 2018-08-26 DIAGNOSIS — S7292XD Unspecified fracture of left femur, subsequent encounter for closed fracture with routine healing: Secondary | ICD-10-CM | POA: Diagnosis not present

## 2018-08-26 DIAGNOSIS — M9702XD Periprosthetic fracture around internal prosthetic left hip joint, subsequent encounter: Secondary | ICD-10-CM | POA: Diagnosis not present

## 2018-08-26 DIAGNOSIS — F339 Major depressive disorder, recurrent, unspecified: Secondary | ICD-10-CM | POA: Diagnosis not present

## 2018-08-26 DIAGNOSIS — I482 Chronic atrial fibrillation, unspecified: Secondary | ICD-10-CM | POA: Diagnosis not present

## 2018-08-28 DIAGNOSIS — M9702XD Periprosthetic fracture around internal prosthetic left hip joint, subsequent encounter: Secondary | ICD-10-CM | POA: Diagnosis not present

## 2018-08-28 DIAGNOSIS — S7292XD Unspecified fracture of left femur, subsequent encounter for closed fracture with routine healing: Secondary | ICD-10-CM | POA: Diagnosis not present

## 2018-08-28 DIAGNOSIS — J449 Chronic obstructive pulmonary disease, unspecified: Secondary | ICD-10-CM | POA: Diagnosis not present

## 2018-08-28 DIAGNOSIS — I10 Essential (primary) hypertension: Secondary | ICD-10-CM | POA: Diagnosis not present

## 2018-08-28 DIAGNOSIS — F339 Major depressive disorder, recurrent, unspecified: Secondary | ICD-10-CM | POA: Diagnosis not present

## 2018-08-28 DIAGNOSIS — I482 Chronic atrial fibrillation, unspecified: Secondary | ICD-10-CM | POA: Diagnosis not present

## 2018-09-01 ENCOUNTER — Telehealth (INDEPENDENT_AMBULATORY_CARE_PROVIDER_SITE_OTHER): Payer: Self-pay

## 2018-09-01 NOTE — Telephone Encounter (Signed)
Called patient and asked the screening questions.  Do you have now or have you had in the past 7 days a fever and/or chills? NO  Do you have now or have you had in the past 7 days a cough? NO  Do you have now or have you had in the last 7 days nausea, vomiting or abdominal pain? NO  Have you been exposed to anyone who has tested positive for COVID-19? NO  Have you or anyone who lives with you traveled within the last month? NO 

## 2018-09-02 ENCOUNTER — Ambulatory Visit (INDEPENDENT_AMBULATORY_CARE_PROVIDER_SITE_OTHER): Payer: Medicare Other

## 2018-09-02 ENCOUNTER — Ambulatory Visit (INDEPENDENT_AMBULATORY_CARE_PROVIDER_SITE_OTHER): Payer: Medicare Other | Admitting: Orthopaedic Surgery

## 2018-09-02 ENCOUNTER — Other Ambulatory Visit: Payer: Self-pay

## 2018-09-02 ENCOUNTER — Encounter (INDEPENDENT_AMBULATORY_CARE_PROVIDER_SITE_OTHER): Payer: Self-pay | Admitting: Orthopaedic Surgery

## 2018-09-02 ENCOUNTER — Telehealth (INDEPENDENT_AMBULATORY_CARE_PROVIDER_SITE_OTHER): Payer: Self-pay

## 2018-09-02 DIAGNOSIS — M9702XD Periprosthetic fracture around internal prosthetic left hip joint, subsequent encounter: Secondary | ICD-10-CM

## 2018-09-02 DIAGNOSIS — M1712 Unilateral primary osteoarthritis, left knee: Secondary | ICD-10-CM | POA: Diagnosis not present

## 2018-09-02 DIAGNOSIS — M545 Low back pain, unspecified: Secondary | ICD-10-CM

## 2018-09-02 MED ORDER — METHYLPREDNISOLONE 4 MG PO TBPK: ORAL_TABLET | ORAL | 0 refills | Status: DC

## 2018-09-02 NOTE — Telephone Encounter (Signed)
Please submit for gel inj left knee- Monovisc- Dr Erlinda Hong.

## 2018-09-02 NOTE — Progress Notes (Signed)
Office Visit Note   Patient: Teresa Frey           Date of Birth: Dec 14, 1929           MRN: [id number] Visit Date: 09/02/2018              Requested by: [name], [name], MD [address] [place], [place] [address] PCP: [name], [name], MD   Assessment & Plan: Visit Diagnoses:  1. Periprosthetic fracture around internal prosthetic left hip joint, subsequent encounter   2. Acute right-sided low back pain without sciatica   3. Primary osteoarthritis of left knee     Plan: Sharronda is doing well 31-month status post ORIF left periprosthetic femur fracture with evidence of increasing callus formation and healing.  For her knee we will submit preauthorization for Monovisc injection since she has had minimal relief from cortisone injection.  In terms of her back and right hip pain I think this is a response to increased activity and ambulation especially with a cane at this point.  I have written a prescription for physical therapy to address these concerns.  Questions encouraged and answered.  Recheck in 2 months with 2 view x-rays of the left femur.  Prescription for Medrol Dosepak was prescribed today.  Follow-Up Instructions: Return in about 2 months (around 11/02/2018).   Orders:  Orders Placed This Encounter  Procedures  . XR FEMUR MIN 2 VIEWS LEFT  . XR Lumbar Spine 2-3 Views  . XR KNEE 3 VIEW LEFT   Meds ordered this encounter  Medications  . methylPREDNISolone (MEDROL DOSEPAK) 4 MG TBPK tablet    Sig: Use as directed    Dispense:  21 tablet    Refill:  0      Procedures: No procedures performed   Clinical Data: No additional findings.   Subjective: Chief Complaint  Patient presents with  . Left Leg - Pain  . Lower Back - Pain    Ms. Hershkowitz is following up for her left femoral periprosthetic fracture as well as right lower back pain and left knee pain.  In terms of the femur she has now transition to a cane for ambulation and she is doing well but she is  having some increased discomfort in her right hip and low back.  No radicular symptoms.  No new injuries.  She denies any numbness and tingling.  In terms of her left knee the cortisone injection gave her minimal relief.  She is interested in viscosupplementation injections.  She is also complaining of left leg being slightly shorter than the right leg.   Review of Systems  Constitutional: Negative.   HENT: Negative.   Eyes: Negative.   Respiratory: Negative.   Cardiovascular: Negative.   Endocrine: Negative.   Musculoskeletal: Negative.   Neurological: Negative.   Hematological: Negative.   Psychiatric/Behavioral: Negative.   All other systems reviewed and are negative.    Objective: Vital Signs: There were no vitals taken for this visit.  Physical Exam Vitals signs and nursing note reviewed.  Constitutional:      Appearance: She is well-developed.  Pulmonary:     Effort: Pulmonary effort is normal.  Skin:    General: Skin is warm.     Capillary Refill: Capillary refill takes less than 2 seconds.  Neurological:     Mental Status: She is alert and oriented to person, place, and time.  Psychiatric:        Behavior: Behavior normal.  Thought Content: Thought content normal.        Judgment: Judgment normal.     Ortho Exam Left lower extremity exam shows fully healed surgical scar.  Left knee shows no joint effusion. Right hip exam shows painless range of motion of the right hip.  She has moderate tenderness to the trochanteric bursa as well as some mild tenderness to the lumbar spine. Specialty Comments:  No specialty comments available.  Imaging: Xr Femur Min 2 Views Left  Result Date: 09/02/2018 Continued healing of the periprosthetic femur fracture with callus formation.  No hardware complications.  Xr Knee 3 View Left  Result Date: 09/02/2018 Moderate to severe degenerative joint disease with joint space narrowing and generalized osteopenia.  Xr Lumbar  Spine 2-3 Views  Result Date: 09/02/2018 Multilevel degenerative disc disease and lumbar spondylosis.  No acute abnormalities.    PMFS History: Patient Active Problem List   Diagnosis Date Noted  . Periprosthetic fracture around internal prosthetic left hip joint ([place]) 04/19/2018  . Long term (current) use of anticoagulants [Z79.01] 08/14/2017  . Genetic testing 04/01/2016  . possible sleep apnea by history 02/12/2016  . A-fib ([place]) 02/11/2016  . Surgical wound infection 02/11/2016  . Breast cancer of upper-outer quadrant of right female breast ([place]) 01/11/2016  . Impaired glucose tolerance 07/28/2014  . Anxiety disorder, unspecified 04/07/2007  . Osteoarthritis 04/07/2007  . Essential hypertension 04/02/2007  . History of colonic polyps 04/02/2007  . DIVERTICULITIS, HX OF 04/02/2007   Past Medical History:  Diagnosis Date  . ANXIETY 04/07/2007  . Breast cancer of upper-outer quadrant of right female breast ([place]) 01/11/2016  . COLONIC POLYPS, HX OF 04/02/2007   adenomatous  . DIVERTICULITIS, HX OF 04/02/2007  . Diverticulosis   . DJD (degenerative joint disease)   . Hemorrhoids   . HYPERTENSION 04/02/2007  . OSTEOARTHRITIS, SEVERE 04/07/2007  . 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 WEIGHT GAIN 11/13/2007    Family History  Problem Relation Age of Onset  . Pancreatic cancer Mother 18       d. 11  . Cancer Father 10       hodgkin's lymphoma and leukemia; d. 75  . Colon cancer Brother        dx. late 70s; smoker  . Pancreatic cancer Brother 80       d. 57; smoker  . Lung cancer Brother 40       smoker  . Heart attack Brother        d. late 37s  . Colon cancer Paternal Aunt   . Breast cancer Daughter 61       negative genetic testing in 2016  . Skin cancer Daughter        non-melanoma type; +sun exposure  . Kidney failure Son 20       +EtOH abuse resulting in liver, kidney, and pancreas failure  . Diabetes Maternal Uncle        d. later age  . Heart  disease Neg Hx        family  . Esophageal cancer Neg Hx   . Rectal cancer Neg Hx   . Stomach cancer Neg Hx     Past Surgical History:  Procedure Laterality Date  . APPENDECTOMY    . BREAST LUMPECTOMY WITH RADIOACTIVE SEED LOCALIZATION Right 02/08/2016   Procedure: RIGHT BREAST LUMPECTOMY WITH RADIOACTIVE SEED LOCALIZATION;  Surgeon: Fanny Skates, MD;  Location: [facility];  Service: General;  Laterality: Right;  .  ORIF FEMUR FRACTURE Left 04/21/2018   Procedure: OPEN REDUCTION INTERNAL FIXATION (ORIF) PERI PROSTHETIC FEMUR FRACTURE;  Surgeon: Leandrew Koyanagi, MD;  Location: [facility];  Service: Orthopedics;  Laterality: Left;  . TONSILLECTOMY    . TOTAL HIP ARTHROPLASTY  1999, left hip    2006 right hip  . TUBAL LIGATION     Social History   Occupational History  . Not on file  Tobacco Use  . Smoking status: Former Smoker    Packs/day: 1.00    Years: 27.00    Pack years: 27.00    Types: Cigarettes    Last attempt to quit: 06/04/1978    Years since quitting: 40.2  . Smokeless tobacco: Never Used  Substance and Sexual Activity  . Alcohol use: Yes    Alcohol/week: 7.0 standard drinks    Types: 7 Glasses of wine per week    Comment: 1-2 glasses wine after dinner, but not necessarily each day  . Drug use: No  . Sexual activity: Not on file

## 2018-09-03 ENCOUNTER — Telehealth: Payer: Self-pay

## 2018-09-03 DIAGNOSIS — F339 Major depressive disorder, recurrent, unspecified: Secondary | ICD-10-CM | POA: Diagnosis not present

## 2018-09-03 DIAGNOSIS — I10 Essential (primary) hypertension: Secondary | ICD-10-CM | POA: Diagnosis not present

## 2018-09-03 DIAGNOSIS — I482 Chronic atrial fibrillation, unspecified: Secondary | ICD-10-CM | POA: Diagnosis not present

## 2018-09-03 DIAGNOSIS — S7292XD Unspecified fracture of left femur, subsequent encounter for closed fracture with routine healing: Secondary | ICD-10-CM | POA: Diagnosis not present

## 2018-09-03 DIAGNOSIS — M9702XD Periprosthetic fracture around internal prosthetic left hip joint, subsequent encounter: Secondary | ICD-10-CM | POA: Diagnosis not present

## 2018-09-03 DIAGNOSIS — J449 Chronic obstructive pulmonary disease, unspecified: Secondary | ICD-10-CM | POA: Diagnosis not present

## 2018-09-03 NOTE — Telephone Encounter (Signed)
Author phoned pt. to assess interest in scheduling virtual awv. No answer, author left detailed VM asking for return call to schedule with PCP.

## 2018-09-04 ENCOUNTER — Telehealth: Payer: Self-pay

## 2018-09-04 DIAGNOSIS — S7292XD Unspecified fracture of left femur, subsequent encounter for closed fracture with routine healing: Secondary | ICD-10-CM | POA: Diagnosis not present

## 2018-09-04 DIAGNOSIS — M9702XD Periprosthetic fracture around internal prosthetic left hip joint, subsequent encounter: Secondary | ICD-10-CM | POA: Diagnosis not present

## 2018-09-04 DIAGNOSIS — J449 Chronic obstructive pulmonary disease, unspecified: Secondary | ICD-10-CM | POA: Diagnosis not present

## 2018-09-04 DIAGNOSIS — F339 Major depressive disorder, recurrent, unspecified: Secondary | ICD-10-CM | POA: Diagnosis not present

## 2018-09-04 DIAGNOSIS — I482 Chronic atrial fibrillation, unspecified: Secondary | ICD-10-CM | POA: Diagnosis not present

## 2018-09-04 DIAGNOSIS — I10 Essential (primary) hypertension: Secondary | ICD-10-CM | POA: Diagnosis not present

## 2018-09-04 NOTE — Telephone Encounter (Signed)

## 2018-09-08 ENCOUNTER — Ambulatory Visit (INDEPENDENT_AMBULATORY_CARE_PROVIDER_SITE_OTHER): Payer: Medicare Other | Admitting: Pharmacist

## 2018-09-08 ENCOUNTER — Other Ambulatory Visit: Payer: Self-pay

## 2018-09-08 DIAGNOSIS — I4891 Unspecified atrial fibrillation: Secondary | ICD-10-CM

## 2018-09-08 DIAGNOSIS — Z7901 Long term (current) use of anticoagulants: Secondary | ICD-10-CM

## 2018-09-08 LAB — POCT INR: INR: 2.4 (ref 2.0–3.0)

## 2018-09-08 NOTE — Patient Instructions (Signed)
Description   Start taking 2 tablets daily except for 3 tablets on Thursdays like you were supposed to start doing after your last check. Repeat INR in 4 weeks.

## 2018-09-09 DIAGNOSIS — S7292XD Unspecified fracture of left femur, subsequent encounter for closed fracture with routine healing: Secondary | ICD-10-CM | POA: Diagnosis not present

## 2018-09-09 DIAGNOSIS — M9702XD Periprosthetic fracture around internal prosthetic left hip joint, subsequent encounter: Secondary | ICD-10-CM | POA: Diagnosis not present

## 2018-09-09 DIAGNOSIS — F339 Major depressive disorder, recurrent, unspecified: Secondary | ICD-10-CM | POA: Diagnosis not present

## 2018-09-09 DIAGNOSIS — I482 Chronic atrial fibrillation, unspecified: Secondary | ICD-10-CM | POA: Diagnosis not present

## 2018-09-09 DIAGNOSIS — J449 Chronic obstructive pulmonary disease, unspecified: Secondary | ICD-10-CM | POA: Diagnosis not present

## 2018-09-09 DIAGNOSIS — I10 Essential (primary) hypertension: Secondary | ICD-10-CM | POA: Diagnosis not present

## 2018-09-10 ENCOUNTER — Other Ambulatory Visit: Payer: Self-pay | Admitting: Cardiology

## 2018-09-10 DIAGNOSIS — M9702XD Periprosthetic fracture around internal prosthetic left hip joint, subsequent encounter: Secondary | ICD-10-CM | POA: Diagnosis not present

## 2018-09-10 DIAGNOSIS — I10 Essential (primary) hypertension: Secondary | ICD-10-CM | POA: Diagnosis not present

## 2018-09-10 DIAGNOSIS — F339 Major depressive disorder, recurrent, unspecified: Secondary | ICD-10-CM | POA: Diagnosis not present

## 2018-09-10 DIAGNOSIS — I482 Chronic atrial fibrillation, unspecified: Secondary | ICD-10-CM | POA: Diagnosis not present

## 2018-09-10 DIAGNOSIS — J449 Chronic obstructive pulmonary disease, unspecified: Secondary | ICD-10-CM | POA: Diagnosis not present

## 2018-09-10 DIAGNOSIS — S7292XD Unspecified fracture of left femur, subsequent encounter for closed fracture with routine healing: Secondary | ICD-10-CM | POA: Diagnosis not present

## 2018-09-10 NOTE — Telephone Encounter (Signed)
Noted  

## 2018-09-13 DIAGNOSIS — M9702XD Periprosthetic fracture around internal prosthetic left hip joint, subsequent encounter: Secondary | ICD-10-CM | POA: Diagnosis not present

## 2018-09-13 DIAGNOSIS — S7292XD Unspecified fracture of left femur, subsequent encounter for closed fracture with routine healing: Secondary | ICD-10-CM | POA: Diagnosis not present

## 2018-09-13 DIAGNOSIS — Z7901 Long term (current) use of anticoagulants: Secondary | ICD-10-CM | POA: Diagnosis not present

## 2018-09-13 DIAGNOSIS — F419 Anxiety disorder, unspecified: Secondary | ICD-10-CM | POA: Diagnosis not present

## 2018-09-13 DIAGNOSIS — Z87891 Personal history of nicotine dependence: Secondary | ICD-10-CM | POA: Diagnosis not present

## 2018-09-13 DIAGNOSIS — Z853 Personal history of malignant neoplasm of breast: Secondary | ICD-10-CM | POA: Diagnosis not present

## 2018-09-13 DIAGNOSIS — Z9181 History of falling: Secondary | ICD-10-CM | POA: Diagnosis not present

## 2018-09-13 DIAGNOSIS — I482 Chronic atrial fibrillation, unspecified: Secondary | ICD-10-CM | POA: Diagnosis not present

## 2018-09-13 DIAGNOSIS — J449 Chronic obstructive pulmonary disease, unspecified: Secondary | ICD-10-CM | POA: Diagnosis not present

## 2018-09-13 DIAGNOSIS — I10 Essential (primary) hypertension: Secondary | ICD-10-CM | POA: Diagnosis not present

## 2018-09-13 DIAGNOSIS — F339 Major depressive disorder, recurrent, unspecified: Secondary | ICD-10-CM | POA: Diagnosis not present

## 2018-09-13 DIAGNOSIS — Z86010 Personal history of colonic polyps: Secondary | ICD-10-CM | POA: Diagnosis not present

## 2018-09-13 DIAGNOSIS — D500 Iron deficiency anemia secondary to blood loss (chronic): Secondary | ICD-10-CM | POA: Diagnosis not present

## 2018-09-16 DIAGNOSIS — I482 Chronic atrial fibrillation, unspecified: Secondary | ICD-10-CM | POA: Diagnosis not present

## 2018-09-16 DIAGNOSIS — I10 Essential (primary) hypertension: Secondary | ICD-10-CM | POA: Diagnosis not present

## 2018-09-16 DIAGNOSIS — M9702XD Periprosthetic fracture around internal prosthetic left hip joint, subsequent encounter: Secondary | ICD-10-CM | POA: Diagnosis not present

## 2018-09-16 DIAGNOSIS — J449 Chronic obstructive pulmonary disease, unspecified: Secondary | ICD-10-CM | POA: Diagnosis not present

## 2018-09-16 DIAGNOSIS — F339 Major depressive disorder, recurrent, unspecified: Secondary | ICD-10-CM | POA: Diagnosis not present

## 2018-09-16 DIAGNOSIS — S7292XD Unspecified fracture of left femur, subsequent encounter for closed fracture with routine healing: Secondary | ICD-10-CM | POA: Diagnosis not present

## 2018-09-18 ENCOUNTER — Telehealth (INDEPENDENT_AMBULATORY_CARE_PROVIDER_SITE_OTHER): Payer: Self-pay

## 2018-09-18 DIAGNOSIS — S7292XD Unspecified fracture of left femur, subsequent encounter for closed fracture with routine healing: Secondary | ICD-10-CM | POA: Diagnosis not present

## 2018-09-18 DIAGNOSIS — J449 Chronic obstructive pulmonary disease, unspecified: Secondary | ICD-10-CM | POA: Diagnosis not present

## 2018-09-18 DIAGNOSIS — I10 Essential (primary) hypertension: Secondary | ICD-10-CM | POA: Diagnosis not present

## 2018-09-18 DIAGNOSIS — M9702XD Periprosthetic fracture around internal prosthetic left hip joint, subsequent encounter: Secondary | ICD-10-CM | POA: Diagnosis not present

## 2018-09-18 DIAGNOSIS — F339 Major depressive disorder, recurrent, unspecified: Secondary | ICD-10-CM | POA: Diagnosis not present

## 2018-09-18 DIAGNOSIS — I482 Chronic atrial fibrillation, unspecified: Secondary | ICD-10-CM | POA: Diagnosis not present

## 2018-09-18 NOTE — Telephone Encounter (Signed)
Submitted VOB for Monovisc, left knee. 

## 2018-09-22 ENCOUNTER — Other Ambulatory Visit: Payer: Self-pay

## 2018-09-22 ENCOUNTER — Telehealth: Payer: Self-pay | Admitting: Family Medicine

## 2018-09-22 ENCOUNTER — Ambulatory Visit: Payer: Medicare Other | Admitting: Family Medicine

## 2018-09-22 NOTE — Telephone Encounter (Signed)
Left message to return call to clinic to schedule virtual visit with PCP.

## 2018-09-22 NOTE — Telephone Encounter (Signed)
Scheduled for a phone visit today 09/22/18.

## 2018-09-22 NOTE — Telephone Encounter (Signed)
Pt stated she can't do a doxy appt for her medications because she has a flip phone. Pt stated she will need a telephone visit if an appt is needed. Please call pt for appt

## 2018-09-23 ENCOUNTER — Other Ambulatory Visit: Payer: Self-pay

## 2018-09-23 ENCOUNTER — Ambulatory Visit (INDEPENDENT_AMBULATORY_CARE_PROVIDER_SITE_OTHER): Payer: Medicare Other | Admitting: Family Medicine

## 2018-09-23 ENCOUNTER — Telehealth (INDEPENDENT_AMBULATORY_CARE_PROVIDER_SITE_OTHER): Payer: Self-pay

## 2018-09-23 ENCOUNTER — Encounter: Payer: Self-pay | Admitting: Family Medicine

## 2018-09-23 VITALS — BP 124/78

## 2018-09-23 DIAGNOSIS — F419 Anxiety disorder, unspecified: Secondary | ICD-10-CM | POA: Diagnosis not present

## 2018-09-23 DIAGNOSIS — W19XXXS Unspecified fall, sequela: Secondary | ICD-10-CM | POA: Diagnosis not present

## 2018-09-23 DIAGNOSIS — M150 Primary generalized (osteo)arthritis: Secondary | ICD-10-CM | POA: Diagnosis not present

## 2018-09-23 DIAGNOSIS — M8949 Other hypertrophic osteoarthropathy, multiple sites: Secondary | ICD-10-CM

## 2018-09-23 DIAGNOSIS — I10 Essential (primary) hypertension: Secondary | ICD-10-CM | POA: Diagnosis not present

## 2018-09-23 DIAGNOSIS — M9702XD Periprosthetic fracture around internal prosthetic left hip joint, subsequent encounter: Secondary | ICD-10-CM | POA: Diagnosis not present

## 2018-09-23 DIAGNOSIS — S7292XD Unspecified fracture of left femur, subsequent encounter for closed fracture with routine healing: Secondary | ICD-10-CM | POA: Diagnosis not present

## 2018-09-23 DIAGNOSIS — J449 Chronic obstructive pulmonary disease, unspecified: Secondary | ICD-10-CM | POA: Diagnosis not present

## 2018-09-23 DIAGNOSIS — F339 Major depressive disorder, recurrent, unspecified: Secondary | ICD-10-CM | POA: Diagnosis not present

## 2018-09-23 DIAGNOSIS — Y92009 Unspecified place in unspecified non-institutional (private) residence as the place of occurrence of the external cause: Secondary | ICD-10-CM | POA: Diagnosis not present

## 2018-09-23 DIAGNOSIS — I482 Chronic atrial fibrillation, unspecified: Secondary | ICD-10-CM | POA: Diagnosis not present

## 2018-09-23 DIAGNOSIS — M159 Polyosteoarthritis, unspecified: Secondary | ICD-10-CM

## 2018-09-23 NOTE — Telephone Encounter (Signed)
Talked with patient and advised her that she is approved for gel injection.  Approved for Monovisc, left knee. Buy & Bill Covered at 100% through her insurance. No Co-pay No PA required  Appt. 09/25/2018

## 2018-09-23 NOTE — Assessment & Plan Note (Signed)
BP adequately controlled. No changes in current management. Continue monitoring BP regularly. Continue low-salt diet.

## 2018-09-23 NOTE — Telephone Encounter (Signed)
Patient scheduled for a telephone visit for this afternoon. PCP aware that patient can only do telephone visit.

## 2018-09-23 NOTE — Assessment & Plan Note (Signed)
Mainly left knee. Planning on intra-articular steroid injection. Continue following with Dr. Erlinda Hong.

## 2018-09-23 NOTE — Progress Notes (Signed)
Virtual Visit via Telephone Note  I connected with Teresa Frey on 09/23/18 at  3:30 PM EDT by telephone and verified that I am speaking with the correct person using two identifiers.   I discussed the limitations, risks, security and privacy concerns of performing an evaluation and management service by telephone and the availability of in person appointments. I also discussed with the patient that there may be a patient responsible charge related to this service. She expressed understanding and agreed to proceed.  Location patient: home Location provider: home office Participants present for the call: patient, provider Patient did not have a visit in the prior 7 days to address this/these issue(s).   History of Present Illness:  Teresa Frey is a 85 yo female with Hx of HTN, atrial fib on anticoagulation,anxiety, OA, and breast cancer last seen on 02/24/18. Sine her last OV she suffered a fall at home, walking with flip flats,lost balance and fell on air conditioner in her room.She had to crawl to her hall to reach phone,it took her 2 hours. S/P ORIF left periprosthetic femur fracture. She was discharged to a SNF on 04/24/2018. Discharged home on 06/13/2018.  Now she is using a cane. Having PT twice per week. She feels like progressively she is getting much better.  Last DEXA in 01/2017 in normal range.  HTN, she is on metoprolol tartrate 25 mg twice daily on HCTZ 12.5 mg daily. BP has been checked by physical therapist twice per week, 120s/70s. She denies headache, chest pain, dyspnea, palpitations, abdominal pain, nausea, vomiting, gross hematuria, decreased urine output, or edema.  She also mentions left knee pain, getting worse. He is hoping to get a Monovisc knee injection next time she sees Dr. Lillia Mountain has been discussed.pending PA. No erythema or edema. No recent trauma.  She had knee steroid injection 07/2018, did not help a lot.  Anxiety, currently she is on sertraline 50 mg  daily. She is tolerating medication well. She feels like she is dealing well with stressors. She denies depressed mood or suicidal thoughts.  Observations/Objective: Patient sounds cheerful and well on the phone. I do not appreciate any SOB. Speech and thought processing are grossly intact. Patient reported vitals:BP 124/78   Assessment and Plan:  Essential hypertension BP adequately controlled. No changes in current management. Continue monitoring BP regularly. Continue low-salt diet.  Anxiety disorder, unspecified Problem is well controlled. No changes in sertraline 50 mg daily. Follow-up in 6 months, before if needed.  Osteoarthritis Mainly left knee. Planning on intra-articular steroid injection. Continue following with Dr. Erlinda Hong.  Fall at home, sequela Fall precautions discussed. She is not interested in fall alert system at this time but she keeps her phone with her all the time.  Follow Up Instructions: Fall precautions discussed. I will see her back in 03/2019 for her Medicare preventive visit and follow-up. Keep appt with Dr Erlinda Hong. Continue home PT.  I did not refer this patient for an OV in the next 24 hours for this/these issue(s).  I discussed the assessment and treatment plan with the patient. The patient was provided an opportunity to ask questions and all were answered. The patient agreed with the plan and demonstrated an understanding of the instructions.   The patient was advised to call back or seek an in-person evaluation if the symptoms worsen or if the condition fails to improve as anticipated.  Return in about 6 months (around 03/25/2019) for Medicare and f/u.  I provided 21 minutes of non-face-to-face time  during this encounter.   Danzig Macgregor [name], MD

## 2018-09-23 NOTE — Assessment & Plan Note (Signed)
Problem is well controlled. No changes in sertraline 50 mg daily. Follow-up in 6 months, before if needed.

## 2018-09-24 DIAGNOSIS — F339 Major depressive disorder, recurrent, unspecified: Secondary | ICD-10-CM | POA: Diagnosis not present

## 2018-09-24 DIAGNOSIS — S7292XD Unspecified fracture of left femur, subsequent encounter for closed fracture with routine healing: Secondary | ICD-10-CM | POA: Diagnosis not present

## 2018-09-24 DIAGNOSIS — J449 Chronic obstructive pulmonary disease, unspecified: Secondary | ICD-10-CM | POA: Diagnosis not present

## 2018-09-24 DIAGNOSIS — I482 Chronic atrial fibrillation, unspecified: Secondary | ICD-10-CM | POA: Diagnosis not present

## 2018-09-24 DIAGNOSIS — I10 Essential (primary) hypertension: Secondary | ICD-10-CM | POA: Diagnosis not present

## 2018-09-24 DIAGNOSIS — M9702XD Periprosthetic fracture around internal prosthetic left hip joint, subsequent encounter: Secondary | ICD-10-CM | POA: Diagnosis not present

## 2018-09-25 ENCOUNTER — Other Ambulatory Visit: Payer: Self-pay

## 2018-09-25 ENCOUNTER — Ambulatory Visit (INDEPENDENT_AMBULATORY_CARE_PROVIDER_SITE_OTHER): Payer: Medicare Other | Admitting: Orthopaedic Surgery

## 2018-09-25 ENCOUNTER — Encounter (INDEPENDENT_AMBULATORY_CARE_PROVIDER_SITE_OTHER): Payer: Self-pay | Admitting: Orthopaedic Surgery

## 2018-09-25 DIAGNOSIS — M1712 Unilateral primary osteoarthritis, left knee: Secondary | ICD-10-CM | POA: Diagnosis not present

## 2018-09-25 MED ORDER — HYALURONAN 88 MG/4ML IX SOSY
88.0000 mg | PREFILLED_SYRINGE | INTRA_ARTICULAR | Status: AC | PRN
  Administered 2018-09-25: 11:00:00 88 mg via INTRA_ARTICULAR

## 2018-09-25 MED ORDER — LIDOCAINE HCL 1 % IJ SOLN
5.0000 mL | INTRAMUSCULAR | Status: AC | PRN
  Administered 2018-09-25: 11:00:00 5 mL

## 2018-09-25 NOTE — Progress Notes (Signed)
   Procedure Note  Patient: Teresa Frey             Date of Birth: [birth year]           MRN: [id number]             Visit Date: 09/25/2018  Procedures: Visit Diagnoses: Primary osteoarthritis of left knee  Large Joint Inj: L knee on 09/25/2018 11:20 AM Indications: pain Details: 22 G needle  Arthrogram: No  Medications: 88 mg Hyaluronan 88 MG/4ML; 5 mL lidocaine 1 % Outcome: tolerated well, no immediate complications Patient was prepped and draped in the usual sterile fashion.

## 2018-09-30 DIAGNOSIS — S7292XD Unspecified fracture of left femur, subsequent encounter for closed fracture with routine healing: Secondary | ICD-10-CM | POA: Diagnosis not present

## 2018-09-30 DIAGNOSIS — I10 Essential (primary) hypertension: Secondary | ICD-10-CM | POA: Diagnosis not present

## 2018-09-30 DIAGNOSIS — I482 Chronic atrial fibrillation, unspecified: Secondary | ICD-10-CM | POA: Diagnosis not present

## 2018-09-30 DIAGNOSIS — M9702XD Periprosthetic fracture around internal prosthetic left hip joint, subsequent encounter: Secondary | ICD-10-CM | POA: Diagnosis not present

## 2018-09-30 DIAGNOSIS — F339 Major depressive disorder, recurrent, unspecified: Secondary | ICD-10-CM | POA: Diagnosis not present

## 2018-09-30 DIAGNOSIS — J449 Chronic obstructive pulmonary disease, unspecified: Secondary | ICD-10-CM | POA: Diagnosis not present

## 2018-10-01 DIAGNOSIS — F339 Major depressive disorder, recurrent, unspecified: Secondary | ICD-10-CM | POA: Diagnosis not present

## 2018-10-01 DIAGNOSIS — I10 Essential (primary) hypertension: Secondary | ICD-10-CM | POA: Diagnosis not present

## 2018-10-01 DIAGNOSIS — M9702XD Periprosthetic fracture around internal prosthetic left hip joint, subsequent encounter: Secondary | ICD-10-CM | POA: Diagnosis not present

## 2018-10-01 DIAGNOSIS — I482 Chronic atrial fibrillation, unspecified: Secondary | ICD-10-CM | POA: Diagnosis not present

## 2018-10-01 DIAGNOSIS — J449 Chronic obstructive pulmonary disease, unspecified: Secondary | ICD-10-CM | POA: Diagnosis not present

## 2018-10-01 DIAGNOSIS — S7292XD Unspecified fracture of left femur, subsequent encounter for closed fracture with routine healing: Secondary | ICD-10-CM | POA: Diagnosis not present

## 2018-10-03 ENCOUNTER — Telehealth: Payer: Self-pay

## 2018-10-03 NOTE — Telephone Encounter (Signed)

## 2018-10-06 ENCOUNTER — Other Ambulatory Visit: Payer: Self-pay

## 2018-10-06 ENCOUNTER — Ambulatory Visit (INDEPENDENT_AMBULATORY_CARE_PROVIDER_SITE_OTHER): Payer: Medicare Other | Admitting: Pharmacist

## 2018-10-06 DIAGNOSIS — Z7901 Long term (current) use of anticoagulants: Secondary | ICD-10-CM | POA: Diagnosis not present

## 2018-10-06 DIAGNOSIS — I4891 Unspecified atrial fibrillation: Secondary | ICD-10-CM | POA: Diagnosis not present

## 2018-10-06 LAB — POCT INR: INR: 2.1 (ref 2.0–3.0)

## 2018-10-06 MED ORDER — APIXABAN 5 MG PO TABS: 5.0000 mg | ORAL_TABLET | Freq: Two times a day (BID) | ORAL | 5 refills | Status: DC

## 2018-10-07 DIAGNOSIS — M9702XD Periprosthetic fracture around internal prosthetic left hip joint, subsequent encounter: Secondary | ICD-10-CM | POA: Diagnosis not present

## 2018-10-07 DIAGNOSIS — F339 Major depressive disorder, recurrent, unspecified: Secondary | ICD-10-CM | POA: Diagnosis not present

## 2018-10-07 DIAGNOSIS — J449 Chronic obstructive pulmonary disease, unspecified: Secondary | ICD-10-CM | POA: Diagnosis not present

## 2018-10-07 DIAGNOSIS — I10 Essential (primary) hypertension: Secondary | ICD-10-CM | POA: Diagnosis not present

## 2018-10-07 DIAGNOSIS — I482 Chronic atrial fibrillation, unspecified: Secondary | ICD-10-CM | POA: Diagnosis not present

## 2018-10-07 DIAGNOSIS — S7292XD Unspecified fracture of left femur, subsequent encounter for closed fracture with routine healing: Secondary | ICD-10-CM | POA: Diagnosis not present

## 2018-10-09 DIAGNOSIS — I482 Chronic atrial fibrillation, unspecified: Secondary | ICD-10-CM | POA: Diagnosis not present

## 2018-10-09 DIAGNOSIS — I10 Essential (primary) hypertension: Secondary | ICD-10-CM | POA: Diagnosis not present

## 2018-10-09 DIAGNOSIS — M9702XD Periprosthetic fracture around internal prosthetic left hip joint, subsequent encounter: Secondary | ICD-10-CM | POA: Diagnosis not present

## 2018-10-09 DIAGNOSIS — J449 Chronic obstructive pulmonary disease, unspecified: Secondary | ICD-10-CM | POA: Diagnosis not present

## 2018-10-09 DIAGNOSIS — F339 Major depressive disorder, recurrent, unspecified: Secondary | ICD-10-CM | POA: Diagnosis not present

## 2018-10-09 DIAGNOSIS — S7292XD Unspecified fracture of left femur, subsequent encounter for closed fracture with routine healing: Secondary | ICD-10-CM | POA: Diagnosis not present

## 2018-10-16 ENCOUNTER — Telehealth: Payer: Self-pay | Admitting: Hematology

## 2018-10-16 ENCOUNTER — Other Ambulatory Visit: Payer: Self-pay | Admitting: Family Medicine

## 2018-10-16 DIAGNOSIS — R0609 Other forms of dyspnea: Secondary | ICD-10-CM

## 2018-10-16 DIAGNOSIS — R0600 Dyspnea, unspecified: Secondary | ICD-10-CM

## 2018-10-16 NOTE — Progress Notes (Signed)
[place]   [phone] Fax:([phone]) [phone]   Clinic Follow up Note   Patient Care Team: [name], [name], MD as PCP - General (Family Medicine) Fanny Skates, MD as Consulting Physician (General Surgery) Truitt Merle, MD as Consulting Physician (Hematology) Gery Pray, MD as Consulting Physician (Radiation Oncology) Delice Bison Charlestine Massed, NP as Nurse Practitioner (Hematology and Oncology)   I connected with Teresa Frey on 10/18/2018 at  1:00 PM EDT by telephone visit and verified that I am speaking with the correct person using two identifiers.  I discussed the limitations, risks, security and privacy concerns of performing an evaluation and management service by telephone and the availability of in person appointments. I also discussed with the patient that there may be a patient responsible charge related to this service. The patient expressed understanding and agreed to proceed.   Patient's location:  At work  Provider's location:  My office   CHIEF COMPLAINT: Follow up right breast cancer  SUMMARY OF ONCOLOGIC HISTORY: Oncology History   Breast cancer of upper-outer quadrant of right female breast ([place])   Staging form: Breast, AJCC 7th Edition   - Clinical stage from 01/18/2016: Stage IA (T1b, N0, M0) - Unsigned   - Pathologic stage from 02/08/2016: Stage IA (T1c, N0, cM0) - Signed by Truitt Merle, MD on 02/27/2016      Breast cancer of upper-outer quadrant of right female breast ([place])   01/05/2016 Mammogram    Diagnostic mammogram and ultrasound showed a 9 mm mass in the upper outer quadrant of right breast, axillary nodes were negative.    01/09/2016 Initial Diagnosis    Breast cancer of upper-outer quadrant of right female breast ([place])    01/09/2016 Initial Biopsy    R breast 9:00 position biopsy showed invasive ductal carcinoma, G1-2    01/09/2016 Receptors her2    ER 90%+, PR 40%+, HER2-, Ki67 5%    02/08/2016 Surgery    Right breast lumpectomy,  no SLN biopsy      02/08/2016 Pathology Results    Right breast lumpectomy showed invasive and in situ ductal carcinoma, 1.1 cm, margins were negative. Grade 1, no lymphovascular invasion. Atypical lobular hyperplasia.    02/21/2016 Genetic Testing    Genetic testing did not reveal a known deleterious mutation.  Genetic testing did identify a variant of uncertain significance (VUS) called "c.1659G>A (p.Met553Ile)" in one copy of the NBN gene.  Genes tested include: APC, ATM, AXIN2, BARD1, BMPR1A, BRCA1, BRCA2, BRIP1, CDH1, CDK4, CDKN2A, CHEK2, EPCAM, FANCC, MLH1, MSH2, MSH6, MUTYH, NBN, PALB2, PMS2, POLD1, POLE, PTEN, RAD51C, RAD51D, SCG5/GREM1, SMAD4, STK11, TP53, VHL, and XRCC2.    02/28/2016 -  Anti-estrogen oral therapy    Anastrozole 1 mg daily      CURRENT THERAPY:  anastrozole 63m daily started on 02/28/2016   INTERVAL HISTORY:  Teresa Frey is here for a follow up of right breast cancer. She was able to identify herself by birth date. She notes in 04/2018 she had a broken left femur after a fall and has recovered but still healing. She has recently returned to work. She is not in much pain now. She ambulates with a cane. She notes she still has chronic back pain. She notes she is taking anastrozole and tolerating well. I reviewed her medication list.     REVIEW OF SYSTEMS:   Constitutional: Denies fevers, chills or abnormal weight loss Eyes: Denies blurriness of vision Ears, nose, mouth, throat, and face: Denies mucositis or sore  throat Respiratory: Denies cough, dyspnea or wheezes Cardiovascular: Denies palpitation, chest discomfort or lower extremity swelling Gastrointestinal:  Denies nausea, heartburn or change in bowel habits Skin: Denies abnormal skin rashes MSK: (+) Recent left femur fracture, mild pain, ambulates with cane (+) chronic back pain  Lymphatics: Denies new lymphadenopathy or easy bruising Neurological:Denies numbness, tingling or new weaknesses  Behavioral/Psych: Mood is stable, no new changes  All other systems were reviewed with the patient and are negative.  MEDICAL HISTORY:  Past Medical History:  Diagnosis Date  . ANXIETY 04/07/2007  . Breast cancer of upper-outer quadrant of right female breast ([place]) 01/11/2016  . COLONIC POLYPS, HX OF 04/02/2007   adenomatous  . DIVERTICULITIS, HX OF 04/02/2007  . Diverticulosis   . DJD (degenerative joint disease)   . Hemorrhoids   . HYPERTENSION 04/02/2007  . OSTEOARTHRITIS, SEVERE 04/07/2007  . 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 WEIGHT GAIN 11/13/2007    SURGICAL HISTORY: Past Surgical History:  Procedure Laterality Date  . APPENDECTOMY    . BREAST LUMPECTOMY WITH RADIOACTIVE SEED LOCALIZATION Right 02/08/2016   Procedure: RIGHT BREAST LUMPECTOMY WITH RADIOACTIVE SEED LOCALIZATION;  Surgeon: Fanny Skates, MD;  Location: [place];  Service: General;  Laterality: Right;  . ORIF FEMUR FRACTURE Left 04/21/2018   Procedure: OPEN REDUCTION INTERNAL FIXATION (ORIF) PERI PROSTHETIC FEMUR FRACTURE;  Surgeon: Leandrew Koyanagi, MD;  Location: [place];  Service: Orthopedics;  Laterality: Left;  . TONSILLECTOMY    . TOTAL HIP ARTHROPLASTY  1999, left hip    2006 right hip  . TUBAL LIGATION      I have reviewed the social history and family history with the patient and they are unchanged from previous note.  ALLERGIES:  is allergic to tramadol.  MEDICATIONS:  Current Outpatient Medications  Medication Sig Dispense Refill  . acetaminophen (TYLENOL) 325 MG tablet Take 1-2 tablets (325-650 mg total) by mouth every 6 (six) hours as needed for mild pain (pain score 1-3 or temp > 100.5).    Marland Kitchen albuterol (VENTOLIN HFA) 108 (90 Base) MCG/ACT inhaler INHALE 2 PUFFS INTO THE LUNGS EVERY 6 (SIX) HOURS AS NEEDED FOR WHEEZING OR SHORTNESS OF BREATH. 18 g 0  . anastrozole (ARIMIDEX) 1 MG tablet TAKE 1 TABLET BY MOUTH DAILY 90 tablet 3  . apixaban (ELIQUIS) 5 MG TABS tablet Take  1 tablet (5 mg total) by mouth 2 (two) times daily. 60 tablet 5  . Ascorbic Acid (VITAMIN C) 1000 MG tablet Take 1,000 mg by mouth daily.    Marland Kitchen BIOTIN 5000 PO Take 1 tablet by mouth daily.    . Calcium Carb-Cholecalciferol (CALCIUM 1000 + D PO) Take 1 tablet by mouth daily.    . calcium-vitamin D (OSCAL WITH D) 500-200 MG-UNIT tablet Take 1 tablet by mouth 3 (three) times daily. 90 tablet 12  . docusate sodium (COLACE) 100 MG capsule Take 1 capsule (100 mg total) by mouth 2 (two) times daily. 10 capsule 0  . hydrochlorothiazide (MICROZIDE) 12.5 MG capsule TAKE 1 CAPSULE BY MOUTH DAILY. 30 capsule 3  . metoprolol tartrate (LOPRESSOR) 25 MG tablet TAKE 1 TABLET BY MOUTH DAILY. 90 tablet 3  . Multiple Vitamins-Minerals (CENTRUM SILVER ADULT 50+) TABS Take 1 tablet by mouth daily.    Marland Kitchen omega-3 acid ethyl esters (LOVAZA) 1 g capsule Take 1 g by mouth daily.     Marland Kitchen oxyCODONE-acetaminophen (PERCOCET) 5-325 MG tablet Take 1-2 tablets by mouth every 4 (four) hours  as needed for severe pain. 30 tablet 0  . polyethylene glycol (MIRALAX / GLYCOLAX) packet Take 17 g by mouth daily as needed for mild constipation. 14 each 0  . sertraline (ZOLOFT) 50 MG tablet Take 1 tablet (50 mg total) by mouth daily. 90 tablet 2  . vitamin B-12 (CYANOCOBALAMIN) 1000 MCG tablet Take 1,000 mcg by mouth daily.    Marland Kitchen warfarin (COUMADIN) 5 MG tablet Take up to 2 tablets daily or As Directed. 90 tablet 1  . zinc sulfate 220 (50 Zn) MG capsule Take 1 capsule (220 mg total) by mouth daily. 42 capsule 0   No current facility-administered medications for this visit.     PHYSICAL EXAMINATION: ECOG PERFORMANCE STATUS: 2  No vitals taken today, Exam not performed today  LABORATORY DATA:  I have reviewed the data as listed CBC Latest Ref Rng & Units 06/17/2018 04/24/2018 04/23/2018  WBC 4.0 - 10.5 K/uL 7.3 14.1(H) 13.1(H)  Hemoglobin 12.0 - 15.0 g/dL 13.2 8.6(L) 7.5(L)  Hematocrit 36.0 - 46.0 % 44.6 27.1(L) 23.0(L)  Platelets 150 -  400 K/uL 232 209 152     CMP Latest Ref Rng & Units 06/17/2018 04/23/2018 04/22/2018  Glucose 70 - 99 mg/dL 83 148(H) 148(H)  BUN 8 - 23 mg/dL 14 13 12   Creatinine 0.44 - 1.00 mg/dL 0.77 0.72 0.64  Sodium 135 - 145 mmol/L 141 135 137  Potassium 3.5 - 5.1 mmol/L 3.9 3.7 4.1  Chloride 98 - 111 mmol/L 103 99 101  CO2 22 - 32 mmol/L 30 29 30   Calcium 8.9 - 10.3 mg/dL 8.9 8.5(L) 8.0(L)  Total Protein 6.5 - 8.1 g/dL - - -  Total Bilirubin 0.3 - 1.2 mg/dL - - -  Alkaline Phos 38 - 126 U/L - - -  AST 15 - 41 U/L - - -  ALT 0 - 44 U/L - - -      RADIOGRAPHIC STUDIES: I have personally reviewed the radiological images as listed and agreed with the findings in the report. No results found.   ASSESSMENT & PLAN:  Teresa Frey is a 85 y.o. female with   1. Breast cancer of upper-outer quadrant of right breast, invasive ductal carcinoma, pT1CN0M0, stage IA, ER+/PR+/HER2- -She was diagnosed in 01/2016. She is s/p right breast lumpectomy,  -Giving the low Ki-67 and agree 1 disease, I think this is likely a low risk cancer. I do not recommend adjuvant chemotherapy or Oncotype -She started antiestrogen with Anastrozole in 02/2016. Tolerating moderately well with arthralgia. Will continue for 5 years total. -From a breast cancer standpoint she is clinically doing well. Her 12/2017 mammogram was unremarkable. There is no clinical concern for recurrence.  -Given her recent femur fracture, Will check her DEXA before next visit. I discussed anastrozole can weaken her bone. If DEXA shows significant decrease may consider switching to Tamoxifen when can strengthen her bone.  -Continue surveillance. Next mammogram in 12/2018  -She will continue Anastrozole -F/u in 3-4 months   2. Genetic Testing was negative  3. HTN, AFib  -She was on hydrochlorothiazide for hypertension before, which was held when she had a 2 fibrillation. She is on low-dose metoprolol. -Her blood pressure has been high in my office,  I strongly encouraged her to monitor at home, and contact her primary care physician if remains to be high.  -Given her Afib episode on 06/28/17, she was placed on Lopressor 12.59m BID and Eliquis 5 mg BID  4. Arthralgia, Joint Stiffness -Chronic left hip  pain. Follows with orthopedics.  -Joint pain has slightly increased with anastrozole -Taking meloxicam -overall stable lately   6. Bone health -I previously spoke with the patient about the risk for decreased strength in bones with anastrozole -Encouraged her to begin taking calcium and Vitamin D supplements -Had fall in 04/2018 that resulted in left femur fracture, still recovering. She ambulated with cane.  She is taking oral calcium.  -01/22/17 DEXA was normal. Repeat in 01/2019. If significantly worse, may consider switching anastrozole to Tamoxifen.   Plan  -Clinically doing well  -Continue anastrozole -Mammogram and DEXA before next visit.   -Lab and f/u in 3-4 months    No problem-specific Assessment & Plan notes found for this encounter.   Orders Placed This Encounter  Procedures  . MM DIAG BREAST TOMO BILATERAL    Standing Status:   Future    Standing Expiration Date:   10/17/2019    Scheduling Instructions:     [facility]    Order Specific Question:   Reason for Exam (SYMPTOM  OR DIAGNOSIS REQUIRED)    Answer:   screening    Order Specific Question:   Preferred imaging location?    Answer:   External  . DG Bone Density    Standing Status:   Future    Standing Expiration Date:   10/17/2019    Scheduling Instructions:     [facility]    Order Specific Question:   Reason for Exam (SYMPTOM  OR DIAGNOSIS REQUIRED)    Answer:   screening    Order Specific Question:   Preferred imaging location?    Answer:   External   I discussed the assessment and treatment plan with the patient. The patient was provided an opportunity to ask questions and all were answered. The patient agreed with the plan and demonstrated an understanding of  the instructions.  The patient was advised to call back or seek an in-person evaluation if the symptoms worsen or if the condition fails to improve as anticipated.  I provided 15 minutes of non face-to-face telephone visit time during this encounter, and > 50% was spent counseling as documented under my assessment & plan.    Truitt Merle, MD 10/18/2018   I, Joslyn Devon, am acting as scribe for Truitt Merle, MD.   I have reviewed the above documentation for accuracy and completeness, and I agree with the above.

## 2018-10-16 NOTE — Telephone Encounter (Signed)
Called patient regarding upcoming [facility] appointment, left a voicemail. Due to no e-mail being listed, I have changed this to a telephone visit.

## 2018-10-17 ENCOUNTER — Inpatient Hospital Stay: Payer: Medicare Other | Attending: Hematology | Admitting: Hematology

## 2018-10-17 ENCOUNTER — Other Ambulatory Visit: Payer: Medicare Other

## 2018-10-17 ENCOUNTER — Telehealth: Payer: Self-pay | Admitting: Hematology

## 2018-10-17 DIAGNOSIS — E2839 Other primary ovarian failure: Secondary | ICD-10-CM

## 2018-10-17 DIAGNOSIS — C50411 Malignant neoplasm of upper-outer quadrant of right female breast: Secondary | ICD-10-CM

## 2018-10-17 DIAGNOSIS — Z170 Estrogen receptor positive status [ER+]: Secondary | ICD-10-CM | POA: Diagnosis not present

## 2018-10-17 NOTE — Telephone Encounter (Signed)
Scheduled appt per 5/15 los.  A calendar will be mailed out.  Sent a message to get the referral for [facility] faxed over to them.

## 2018-10-18 ENCOUNTER — Encounter: Payer: Self-pay | Admitting: Hematology

## 2018-10-23 ENCOUNTER — Ambulatory Visit (INDEPENDENT_AMBULATORY_CARE_PROVIDER_SITE_OTHER): Payer: Medicare Other

## 2018-10-23 ENCOUNTER — Ambulatory Visit (INDEPENDENT_AMBULATORY_CARE_PROVIDER_SITE_OTHER): Payer: Medicare Other | Admitting: Orthopaedic Surgery

## 2018-10-23 ENCOUNTER — Other Ambulatory Visit: Payer: Self-pay

## 2018-10-23 ENCOUNTER — Encounter: Payer: Self-pay | Admitting: Orthopaedic Surgery

## 2018-10-23 DIAGNOSIS — M9702XD Periprosthetic fracture around internal prosthetic left hip joint, subsequent encounter: Secondary | ICD-10-CM

## 2018-10-23 NOTE — Progress Notes (Signed)
Post-Op Visit Note   Patient: Teresa Frey           Date of Birth: 1929-07-08           MRN: [id number] Visit Date: 10/23/2018 PCP: [name], [name], MD   Assessment & Plan:  Chief Complaint:  Chief Complaint  Patient presents with  . Left Hip - Pain, Routine Post Op   Visit Diagnoses:  1. Periprosthetic fracture around internal prosthetic left hip joint, subsequent encounter     Plan: Patient is a pleasant 85 year old female presents our clinic today 6 months status post left periprosthetic femur fracture, date of surgery 04/21/2018.  She is healing nicely.  She has been doing fairly well.  She has noticed pain to the piriformis since surgery, although this has gradually improved.  She has recently finished home health physical therapy where she has regained great range of motion and strength.  She has continue to use her bone stimulator 3 hours a day.  Examination of her left hip reveals near full and painless range of motion.  Near full strength.  No tenderness to the trochanteric bursa but she does have marked tenderness posterior to this.  At this point, she will continue with her bone stimulator for 3 hours a day.  She will continue with her piriformis stretches.  Follow-up with [place] in 2 months time for repeat evaluation and x-rays.  Follow-Up Instructions: Return in about 2 months (around 12/23/2018).   Orders:  Orders Placed This Encounter  Procedures  . XR FEMUR MIN 2 VIEWS LEFT   No orders of the defined types were placed in this encounter.   Imaging: Xr Femur Min 2 Views Left  Result Date: 10/23/2018 X-rays demonstrate abundant callus formation around the fracture site.  No complications noted.   PMFS History: Patient Active Problem List   Diagnosis Date Noted  . Periprosthetic fracture around internal prosthetic left hip joint ([place]) 04/19/2018  . Long term (current) use of anticoagulants [Z79.01] 08/14/2017  . Genetic testing 04/01/2016  . possible sleep apnea  by history 02/12/2016  . A-fib ([place]) 02/11/2016  . Surgical wound infection 02/11/2016  . Breast cancer of upper-outer quadrant of right female breast ([place]) 01/11/2016  . Impaired glucose tolerance 07/28/2014  . Anxiety disorder, unspecified 04/07/2007  . Osteoarthritis 04/07/2007  . Essential hypertension 04/02/2007  . History of colonic polyps 04/02/2007  . DIVERTICULITIS, HX OF 04/02/2007   Past Medical History:  Diagnosis Date  . ANXIETY 04/07/2007  . Breast cancer of upper-outer quadrant of right female breast ([place]) 01/11/2016  . COLONIC POLYPS, HX OF 04/02/2007   adenomatous  . DIVERTICULITIS, HX OF 04/02/2007  . Diverticulosis   . DJD (degenerative joint disease)   . Hemorrhoids   . HYPERTENSION 04/02/2007  . OSTEOARTHRITIS, SEVERE 04/07/2007  . 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 WEIGHT GAIN 11/13/2007    Family History  Problem Relation Age of Onset  . Pancreatic cancer Mother 30       d. 65  . Cancer Father 24       hodgkin's lymphoma and leukemia; d. 56  . Colon cancer Brother        dx. late 70s; smoker  . Pancreatic cancer Brother 80       d. 33; smoker  . Lung cancer Brother 40       smoker  . Heart attack Brother        d. late 10s  .  Colon cancer Paternal Aunt   . Breast cancer Daughter 65       negative genetic testing in 2016  . Skin cancer Daughter        non-melanoma type; +sun exposure  . Kidney failure Son 60       +EtOH abuse resulting in liver, kidney, and pancreas failure  . Diabetes Maternal Uncle        d. later age  . Heart disease Neg Hx        family  . Esophageal cancer Neg Hx   . Rectal cancer Neg Hx   . Stomach cancer Neg Hx     Past Surgical History:  Procedure Laterality Date  . APPENDECTOMY    . BREAST LUMPECTOMY WITH RADIOACTIVE SEED LOCALIZATION Right 02/08/2016   Procedure: RIGHT BREAST LUMPECTOMY WITH RADIOACTIVE SEED LOCALIZATION;  Surgeon: Fanny Skates, MD;  Location: [place];  Service:  General;  Laterality: Right;  . ORIF FEMUR FRACTURE Left 04/21/2018   Procedure: OPEN REDUCTION INTERNAL FIXATION (ORIF) PERI PROSTHETIC FEMUR FRACTURE;  Surgeon: Leandrew Koyanagi, MD;  Location: [place];  Service: Orthopedics;  Laterality: Left;  . TONSILLECTOMY    . TOTAL HIP ARTHROPLASTY  1999, left hip    2006 right hip  . TUBAL LIGATION     Social History   Occupational History  . Not on file  Tobacco Use  . Smoking status: Former Smoker    Packs/day: 1.00    Years: 27.00    Pack years: 27.00    Types: Cigarettes    Last attempt to quit: 06/04/1978    Years since quitting: 40.4  . Smokeless tobacco: Never Used  Substance and Sexual Activity  . Alcohol use: Yes    Alcohol/week: 7.0 standard drinks    Types: 7 Glasses of wine per week    Comment: 1-2 glasses wine after dinner, but not necessarily each day  . Drug use: No  . Sexual activity: Not on file

## 2018-11-01 ENCOUNTER — Telehealth: Payer: Self-pay | Admitting: *Deleted

## 2018-11-01 DIAGNOSIS — Z20822 Contact with and (suspected) exposure to covid-19: Secondary | ICD-10-CM

## 2018-11-01 NOTE — Telephone Encounter (Signed)
I called pt and let her know she may have been potentially exposed to an employee who later tested positive for COVID-19 at [facility].   I let her know we were offering free COVID-19 testing.   He said she did want to be tested. I scheduled her for Sunday Nov 02, 2018 at 1:00.   I let her know to stay in her car and wear a mask.   They would do the nasal swab with her in the car.  I scheduled her at the [facility] location in [place].  I entered the order and sent this encounter to the agents to be put on the testing schedule.

## 2018-11-02 ENCOUNTER — Other Ambulatory Visit: Payer: Medicare Other

## 2018-11-02 DIAGNOSIS — Z20822 Contact with and (suspected) exposure to covid-19: Secondary | ICD-10-CM

## 2018-11-03 LAB — NOVEL CORONAVIRUS, NAA: SARS-CoV-2, NAA: NOT DETECTED

## 2018-11-04 ENCOUNTER — Telehealth: Payer: Self-pay

## 2018-11-04 NOTE — Telephone Encounter (Signed)
lmom for prescreen  

## 2018-11-07 ENCOUNTER — Ambulatory Visit (INDEPENDENT_AMBULATORY_CARE_PROVIDER_SITE_OTHER): Payer: Medicare Other | Admitting: *Deleted

## 2018-11-07 ENCOUNTER — Other Ambulatory Visit: Payer: Self-pay

## 2018-11-07 DIAGNOSIS — Z7901 Long term (current) use of anticoagulants: Secondary | ICD-10-CM

## 2018-11-07 DIAGNOSIS — I4891 Unspecified atrial fibrillation: Secondary | ICD-10-CM | POA: Diagnosis not present

## 2018-11-07 LAB — POCT INR: INR: 1.8 — AB (ref 2.0–3.0)

## 2018-11-07 NOTE — Patient Instructions (Signed)
Description   Today take 3 tablets, then  Continue taking 2 tablets daily except for 3 tablets on Thursdays. Repeat INR in 3 weeks.

## 2018-11-11 DIAGNOSIS — C44319 Basal cell carcinoma of skin of other parts of face: Secondary | ICD-10-CM | POA: Diagnosis not present

## 2018-11-27 ENCOUNTER — Telehealth: Payer: Self-pay

## 2018-11-27 NOTE — Telephone Encounter (Signed)
lmom for prescreen  

## 2018-12-02 DIAGNOSIS — C4441 Basal cell carcinoma of skin of scalp and neck: Secondary | ICD-10-CM | POA: Diagnosis not present

## 2018-12-03 ENCOUNTER — Other Ambulatory Visit: Payer: Self-pay

## 2018-12-03 ENCOUNTER — Other Ambulatory Visit: Payer: Self-pay | Admitting: Cardiology

## 2018-12-04 ENCOUNTER — Ambulatory Visit (INDEPENDENT_AMBULATORY_CARE_PROVIDER_SITE_OTHER): Payer: Medicare Other

## 2018-12-04 ENCOUNTER — Other Ambulatory Visit: Payer: Self-pay

## 2018-12-04 DIAGNOSIS — Z7901 Long term (current) use of anticoagulants: Secondary | ICD-10-CM

## 2018-12-04 DIAGNOSIS — I4891 Unspecified atrial fibrillation: Secondary | ICD-10-CM | POA: Diagnosis not present

## 2018-12-04 LAB — POCT INR: INR: 2.5 (ref 2.0–3.0)

## 2018-12-04 NOTE — Patient Instructions (Signed)
Description   Continue taking 2 tablets daily except for 3 tablets on Thursdays. Repeat INR in 4 weeks.

## 2018-12-23 ENCOUNTER — Ambulatory Visit (INDEPENDENT_AMBULATORY_CARE_PROVIDER_SITE_OTHER): Payer: Medicare Other

## 2018-12-23 ENCOUNTER — Ambulatory Visit (INDEPENDENT_AMBULATORY_CARE_PROVIDER_SITE_OTHER): Payer: Medicare Other | Admitting: Orthopaedic Surgery

## 2018-12-23 DIAGNOSIS — M9702XD Periprosthetic fracture around internal prosthetic left hip joint, subsequent encounter: Secondary | ICD-10-CM | POA: Diagnosis not present

## 2018-12-23 DIAGNOSIS — M1712 Unilateral primary osteoarthritis, left knee: Secondary | ICD-10-CM | POA: Insufficient documentation

## 2018-12-23 DIAGNOSIS — M25552 Pain in left hip: Secondary | ICD-10-CM | POA: Insufficient documentation

## 2018-12-23 MED ORDER — METHYLPREDNISOLONE ACETATE 40 MG/ML IJ SUSP
40.0000 mg | INTRAMUSCULAR | Status: AC | PRN
  Administered 2018-12-23: 40 mg via INTRA_ARTICULAR

## 2018-12-23 MED ORDER — LIDOCAINE HCL 1 % IJ SOLN
3.0000 mL | INTRAMUSCULAR | Status: AC | PRN
  Administered 2018-12-23: 3 mL

## 2018-12-23 MED ORDER — BUPIVACAINE HCL 0.5 % IJ SOLN
3.0000 mL | INTRAMUSCULAR | Status: AC | PRN
  Administered 2018-12-23: 3 mL via INTRA_ARTICULAR

## 2018-12-23 NOTE — Progress Notes (Signed)
Office Visit Note   Patient: Teresa Frey           Date of Birth: 1929/08/19           MRN: [id number] Visit Date: 12/23/2018              Requested by: [name], [name], MD [address] [place],  [place] [address] PCP: [name], [name], MD   Assessment & Plan: Visit Diagnoses:  1. Periprosthetic fracture around internal prosthetic left hip joint, subsequent encounter   2. Pain of left hip joint   3. Primary osteoarthritis of left knee     Plan: Impression is left hip pain likely related to bursitis versus abductor tendinosis and continued left knee degenerative joint disease with minimal relief from Visco supplementation.  We performed cortisone injections into the trochanteric bursa as well as the left knee joint today.  Patient tolerated well.  Her fracture has demonstrated healing at this point and I will release her and she may follow-up as needed.  Follow-Up Instructions: Return if symptoms worsen or fail to improve.   Orders:  Orders Placed This Encounter  Procedures  . XR FEMUR MIN 2 VIEWS LEFT   No orders of the defined types were placed in this encounter.     Procedures: Large Joint Inj: L greater trochanter on 12/23/2018 11:09 AM Indications: pain Details: 22 G needle Medications: 3 mL lidocaine 1 %; 3 mL bupivacaine 0.5 %; 40 mg methylPREDNISolone acetate 40 MG/ML Outcome: tolerated well, no immediate complications Patient was prepped and draped in the usual sterile fashion.       Clinical Data: No additional findings.   Subjective: Chief Complaint  Patient presents with  . Left Leg - Follow-up    2 month femur fx post op    Teresa Frey comes back today for reevaluation of the left hip pain and left knee pain.  She is complaining of tenderness over the lateral aspect of the left hip.  Denies any thigh pain.  She is ambulating with a cane.  She states that the viscosupplementation injections really did not help her left knee pain much.   Review of  Systems  Constitutional: Negative.   HENT: Negative.   Eyes: Negative.   Respiratory: Negative.   Cardiovascular: Negative.   Endocrine: Negative.   Musculoskeletal: Negative.   Neurological: Negative.   Hematological: Negative.   Psychiatric/Behavioral: Negative.   All other systems reviewed and are negative.    Objective: Vital Signs: There were no vitals taken for this visit.  Physical Exam Vitals signs and nursing note reviewed.  Constitutional:      Appearance: She is well-developed.  Pulmonary:     Effort: Pulmonary effort is normal.  Skin:    General: Skin is warm.     Capillary Refill: Capillary refill takes less than 2 seconds.  Neurological:     Mental Status: She is alert and oriented to person, place, and time.  Psychiatric:        Behavior: Behavior normal.        Thought Content: Thought content normal.        Judgment: Judgment normal.     Ortho Exam Left hip exam shows fully healed surgical scar.  She is very tender over the lateral aspect of the greater trochanter.  She also has some pain with resisted hip abduction.  No pain in the left thigh with squeezing or palpation or with ambulation. Left knee exam shows no joint effusion.  Specialty Comments:  No specialty comments available.  Imaging: Xr Femur Min 2 Views Left  Result Date: 12/23/2018 Healed femoral periprosthetic fracture.  No complications to the hip replacement.    PMFS History: Patient Active Problem List   Diagnosis Date Noted  . Pain of left hip joint 12/23/2018  . Primary osteoarthritis of left knee 12/23/2018  . Periprosthetic fracture around internal prosthetic left hip joint ([facility]) 04/19/2018  . Long term (current) use of anticoagulants [Z79.01] 08/14/2017  . Genetic testing 04/01/2016  . possible sleep apnea by history 02/12/2016  . A-fib ([facility]) 02/11/2016  . Surgical wound infection 02/11/2016  . Breast cancer of upper-outer quadrant of right female breast ([facility])  01/11/2016  . Impaired glucose tolerance 07/28/2014  . Anxiety disorder, unspecified 04/07/2007  . Osteoarthritis 04/07/2007  . Essential hypertension 04/02/2007  . History of colonic polyps 04/02/2007  . DIVERTICULITIS, HX OF 04/02/2007   Past Medical History:  Diagnosis Date  . ANXIETY 04/07/2007  . Breast cancer of upper-outer quadrant of right female breast ([facility]) 01/11/2016  . COLONIC POLYPS, HX OF 04/02/2007   adenomatous  . DIVERTICULITIS, HX OF 04/02/2007  . Diverticulosis   . DJD (degenerative joint disease)   . Hemorrhoids   . HYPERTENSION 04/02/2007  . OSTEOARTHRITIS, SEVERE 04/07/2007  . 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 WEIGHT GAIN 11/13/2007    Family History  Problem Relation Age of Onset  . Pancreatic cancer Mother 44       d. 1  . Cancer Father 36       hodgkin's lymphoma and leukemia; d. 62  . Colon cancer Brother        dx. late 60s; smoker  . Pancreatic cancer Brother 80       d. 69; smoker  . Lung cancer Brother 40       smoker  . Heart attack Brother        d. late 55s  . Colon cancer Paternal Aunt   . Breast cancer Daughter 50       negative genetic testing in 2016  . Skin cancer Daughter        non-melanoma type; +sun exposure  . Kidney failure Son 36       +EtOH abuse resulting in liver, kidney, and pancreas failure  . Diabetes Maternal Uncle        d. later age  . Heart disease Neg Hx        family  . Esophageal cancer Neg Hx   . Rectal cancer Neg Hx   . Stomach cancer Neg Hx     Past Surgical History:  Procedure Laterality Date  . APPENDECTOMY    . BREAST LUMPECTOMY WITH RADIOACTIVE SEED LOCALIZATION Right 02/08/2016   Procedure: RIGHT BREAST LUMPECTOMY WITH RADIOACTIVE SEED LOCALIZATION;  Surgeon: Fanny Skates, MD;  Location: [facility];  Service: General;  Laterality: Right;  . ORIF FEMUR FRACTURE Left 04/21/2018   Procedure: OPEN REDUCTION INTERNAL FIXATION (ORIF) PERI PROSTHETIC FEMUR FRACTURE;  Surgeon:  Leandrew Koyanagi, MD;  Location: [facility];  Service: Orthopedics;  Laterality: Left;  . TONSILLECTOMY    . TOTAL HIP ARTHROPLASTY  1999, left hip    2006 right hip  . TUBAL LIGATION     Social History   Occupational History  . Not on file  Tobacco Use  . Smoking status: Former Smoker    Packs/day: 1.00    Years: 27.00    Pack years: 27.00  Types: Cigarettes    Quit date: 06/04/1978    Years since quitting: 40.5  . Smokeless tobacco: Never Used  Substance and Sexual Activity  . Alcohol use: Yes    Alcohol/week: 7.0 standard drinks    Types: 7 Glasses of wine per week    Comment: 1-2 glasses wine after dinner, but not necessarily each day  . Drug use: No  . Sexual activity: Not on file

## 2018-12-25 ENCOUNTER — Telehealth: Payer: Self-pay | Admitting: *Deleted

## 2018-12-25 NOTE — Telephone Encounter (Signed)
Copied from [place] [id number]. Topic: General - Inquiry >> Dec 25, 2018 11:26 AM Richardo Priest, NT wrote: Reason for CRM: Patient called in stating she was wondering if she can get an antibiotic for bronchitis due to having green/yellow mucus coming up. States she is unable for virtual visit. If prescription is able to be sent please send to [facility], [place] [phone] (Phone) [phone] (Fax)  Call back is [phone].  Clinic RN called patient to schedule virtual visit. No answer. LVM for patient to return call

## 2018-12-26 ENCOUNTER — Encounter: Payer: Self-pay | Admitting: Family Medicine

## 2018-12-26 ENCOUNTER — Ambulatory Visit (INDEPENDENT_AMBULATORY_CARE_PROVIDER_SITE_OTHER): Payer: Medicare Other | Admitting: Family Medicine

## 2018-12-26 DIAGNOSIS — B9789 Other viral agents as the cause of diseases classified elsewhere: Secondary | ICD-10-CM | POA: Diagnosis not present

## 2018-12-26 DIAGNOSIS — J069 Acute upper respiratory infection, unspecified: Secondary | ICD-10-CM

## 2018-12-26 DIAGNOSIS — Z20822 Contact with and (suspected) exposure to covid-19: Secondary | ICD-10-CM

## 2018-12-26 DIAGNOSIS — R6889 Other general symptoms and signs: Secondary | ICD-10-CM

## 2018-12-26 NOTE — Telephone Encounter (Signed)
Left message for patient to return call to office to schedule a virtual visit with a provider that is available in the office. PCP is out of the office until Monday.

## 2018-12-26 NOTE — Progress Notes (Signed)
Virtual Visit via Telephone Note  I connected with Teresa Frey on 12/26/18 at  1:30 PM EDT by telephone and verified that I am speaking with the correct person using two identifiers.   I discussed the limitations, risks, security and privacy concerns of performing an evaluation and management service by telephone and the availability of in person appointments. I also discussed with the patient that there may be a patient responsible charge related to this service. The patient expressed understanding and agreed to proceed.  Location patient: home Location provider: work or home office Participants present for the call: patient, provider Patient did not have a visit in the prior 7 days to address this/these issue(s).   History of Present Illness: Pt states she has bronchitis and wants an abx.  Pt endorses chest soreness, productive cough, ear pain, temp 99.7, raspy throat since Tues.  Denies rhinorrhea, HA, n/v, diarrhea.  Has taking claritin for symptoms.  Pt's 77 yo granddaughter also sick. Pt working in the office at [employer]  States she is typically in her office away from others.  Pt did not feel like going into work today.  Pt is a former smoker.  Had negative COVID testing in May.   Observations/Objective: Patient sounds cheerful and well on the phone. I do not appreciate any SOB or coughing.  Moving good air. Speech and thought processing are grossly intact. Patient reported vitals:  Assessment and Plan: Viral URI with cough  -discussed abx not indicated at this time as symptoms likely viral in nature.  Consider COPD exacerbation given rempote smoking hx per chart review -supportive care advised. -COVID testing advised, however pt declined. -f/u with pcp next wk for continued symptoms  Suspected covid-19 Virus Infection  -COVID testing advised.  Pt declines.   Follow Up Instructions: I did not refer this patient for an OV in the next 24 hours for this/these  issue(s).  I discussed the assessment and treatment plan with the patient. The patient was provided an opportunity to ask questions and all were answered. The patient agreed with the plan and demonstrated an understanding of the instructions.   The patient was advised to call back or seek an in-person evaluation if the symptoms worsen or if the condition fails to improve as anticipated.  I provided 7 minutes of non-face-to-face time during this encounter.   Billie Ruddy, MD

## 2018-12-26 NOTE — Telephone Encounter (Signed)
Patient has appointment scheduled with Dr. Volanda Napoleon for 12/26/2018. Nothing further needed at this time.

## 2019-01-01 ENCOUNTER — Other Ambulatory Visit: Payer: Self-pay

## 2019-01-01 ENCOUNTER — Ambulatory Visit (INDEPENDENT_AMBULATORY_CARE_PROVIDER_SITE_OTHER): Payer: Medicare Other | Admitting: Pharmacist

## 2019-01-01 DIAGNOSIS — Z7901 Long term (current) use of anticoagulants: Secondary | ICD-10-CM

## 2019-01-01 DIAGNOSIS — I4891 Unspecified atrial fibrillation: Secondary | ICD-10-CM | POA: Diagnosis not present

## 2019-01-01 LAB — POCT INR: INR: 5.1 — AB (ref 2.0–3.0)

## 2019-01-09 ENCOUNTER — Ambulatory Visit (INDEPENDENT_AMBULATORY_CARE_PROVIDER_SITE_OTHER): Payer: Medicare Other | Admitting: Pharmacist Clinician (PhC)/ Clinical Pharmacy Specialist

## 2019-01-09 ENCOUNTER — Other Ambulatory Visit: Payer: Self-pay

## 2019-01-09 DIAGNOSIS — I4891 Unspecified atrial fibrillation: Secondary | ICD-10-CM

## 2019-01-09 DIAGNOSIS — Z7901 Long term (current) use of anticoagulants: Secondary | ICD-10-CM

## 2019-01-09 LAB — POCT INR: INR: 2.8 (ref 2.0–3.0)

## 2019-01-14 ENCOUNTER — Other Ambulatory Visit: Payer: Self-pay | Admitting: Family Medicine

## 2019-01-14 NOTE — Progress Notes (Signed)
[facility]   [phone] Fax:(336) [phone]   Clinic Follow up Note   Patient Care Team: [name], [name], MD as PCP - General (Family Medicine) Fanny Skates, MD as Consulting Physician (General Surgery) Truitt Merle, MD as Consulting Physician (Hematology) Gery Pray, MD as Consulting Physician (Radiation Oncology) Gardenia Phlegm, NP as Nurse Practitioner (Hematology and Oncology)  Date of Service:  01/16/2019  CHIEF COMPLAINT: Follow up right breast cancer  SUMMARY OF ONCOLOGIC HISTORY: Oncology History Overview Note  Breast cancer of upper-outer quadrant of right female breast [facility])   Staging form: Breast, AJCC 7th Edition   - Clinical stage from 01/18/2016: Stage IA (T1b, N0, M0) - Unsigned   - Pathologic stage from 02/08/2016: Stage IA (T1c, N0, cM0) - Signed by Truitt Merle, MD on 02/27/2016    Breast cancer of upper-outer quadrant of right female breast ([facility])  01/05/2016 Mammogram   Diagnostic mammogram and ultrasound showed a 9 mm mass in the upper outer quadrant of right breast, axillary nodes were negative.   01/09/2016 Initial Diagnosis   Breast cancer of upper-outer quadrant of right female breast ([facility])   01/09/2016 Initial Biopsy   R breast 9:00 position biopsy showed invasive ductal carcinoma, G1-2   01/09/2016 Receptors her2   ER 90%+, PR 40%+, HER2-, Ki67 5%   02/08/2016 Surgery   Right breast lumpectomy, no SLN biopsy     02/08/2016 Pathology Results   Right breast lumpectomy showed invasive and in situ ductal carcinoma, 1.1 cm, margins were negative. Grade 1, no lymphovascular invasion. Atypical lobular hyperplasia.   02/21/2016 Genetic Testing   Genetic testing did not reveal a known deleterious mutation.  Genetic testing did identify a variant of uncertain significance (VUS) called "c.1659G>A (p.Met553Ile)" in one copy of the NBN gene.  Genes tested include: APC, ATM, AXIN2, BARD1, BMPR1A, BRCA1, BRCA2, BRIP1, CDH1, CDK4, CDKN2A,  CHEK2, EPCAM, FANCC, MLH1, MSH2, MSH6, MUTYH, NBN, PALB2, PMS2, POLD1, POLE, PTEN, RAD51C, RAD51D, SCG5/GREM1, SMAD4, STK11, TP53, VHL, and XRCC2.   02/28/2016 -  Anti-estrogen oral therapy   Anastrozole 1 mg daily      CURRENT THERAPY:  anastrozole 26m daily started on 02/28/2016   INTERVAL HISTORY:  Teresa Frey is here for a follow up right breast cancer. She presents to the clinic alone. She notes  Since her 2019 left femur fracture her bone healed but still ambulated with cane. She is taking calcium and Vit D. She also uses cane due to her lower back pain. She also has manageable joint stiffness of fingers. She does still drive herself and work.  She notes she had 2 skin cancers removed from her face this summer.    REVIEW OF SYSTEMS:   Constitutional: Denies fevers, chills or abnormal weight loss Eyes: Denies blurriness of vision Ears, nose, mouth, throat, and face: Denies mucositis or sore throat Respiratory: Denies cough, dyspnea or wheezes Cardiovascular: Denies palpitation, chest discomfort or lower extremity swelling Gastrointestinal:  Denies nausea, heartburn or change in bowel habits Skin: Denies abnormal skin rashes MSK: (+) Lower back pain (+) Stiffness of fingers, manageable (+) Ambulates with cane s/p femur fracture.  Lymphatics: Denies new lymphadenopathy or easy bruising Neurological:Denies numbness, tingling or new weaknesses Behavioral/Psych: Mood is stable, no new changes  All other systems were reviewed with the patient and are negative.  MEDICAL HISTORY:  Past Medical History:  Diagnosis Date   ANXIETY 04/07/2007   Breast cancer of upper-outer quadrant of right female breast ([facility] 01/11/2016  COLONIC POLYPS, HX OF 04/02/2007   adenomatous   DIVERTICULITIS, HX OF 04/02/2007   Diverticulosis    DJD (degenerative joint disease)    Hemorrhoids    HYPERTENSION 04/02/2007   OSTEOARTHRITIS, SEVERE 04/07/2007   Paget's disease of bone    lumbar and  sacrum   WEIGHT GAIN 11/13/2007    SURGICAL HISTORY: Past Surgical History:  Procedure Laterality Date   APPENDECTOMY     BREAST LUMPECTOMY WITH RADIOACTIVE SEED LOCALIZATION Right 02/08/2016   Procedure: RIGHT BREAST LUMPECTOMY WITH RADIOACTIVE SEED LOCALIZATION;  Surgeon: Fanny Skates, MD;  Location: [facility];  Service: General;  Laterality: Right;   ORIF FEMUR FRACTURE Left 04/21/2018   Procedure: OPEN REDUCTION INTERNAL FIXATION (ORIF) PERI PROSTHETIC FEMUR FRACTURE;  Surgeon: Leandrew Koyanagi, MD;  Location: [facility];  Service: Orthopedics;  Laterality: Left;   TONSILLECTOMY     TOTAL HIP ARTHROPLASTY  1999, left hip    2006 right hip   TUBAL LIGATION      I have reviewed the social history and family history with the patient and they are unchanged from previous note.  ALLERGIES:  is allergic to tramadol.  MEDICATIONS:  Current Outpatient Medications  Medication Sig Dispense Refill   acetaminophen (TYLENOL) 325 MG tablet Take 1-2 tablets (325-650 mg total) by mouth every 6 (six) hours as needed for mild pain (pain score 1-3 or temp > 100.5).     albuterol (VENTOLIN HFA) 108 (90 Base) MCG/ACT inhaler INHALE 2 PUFFS INTO THE LUNGS EVERY 6 (SIX) HOURS AS NEEDED FOR WHEEZING OR SHORTNESS OF BREATH. 18 g 0   anastrozole (ARIMIDEX) 1 MG tablet TAKE 1 TABLET BY MOUTH DAILY 90 tablet 3   Ascorbic Acid (VITAMIN C) 1000 MG tablet Take 1,000 mg by mouth daily.     BIOTIN 5000 PO Take 1 tablet by mouth daily.     Calcium Carb-Cholecalciferol (CALCIUM 1000 + D PO) Take 1 tablet by mouth daily.     calcium-vitamin D (OSCAL WITH D) 500-200 MG-UNIT tablet Take 1 tablet by mouth 3 (three) times daily. 90 tablet 12   hydrochlorothiazide (MICROZIDE) 12.5 MG capsule TAKE 1 CAPSULE BY MOUTH DAILY. 30 capsule 3   metoprolol tartrate (LOPRESSOR) 25 MG tablet TAKE 1 TABLET BY MOUTH DAILY. 90 tablet 3   Multiple Vitamins-Minerals (CENTRUM SILVER ADULT 50+) TABS Take 1 tablet  by mouth daily.     omega-3 acid ethyl esters (LOVAZA) 1 g capsule Take 1 g by mouth daily.      vitamin B-12 (CYANOCOBALAMIN) 1000 MCG tablet Take 1,000 mcg by mouth daily.     warfarin (COUMADIN) 5 MG tablet TAKE UP TO 2 TABLETS DAILY OR AS DIRECTED. 90 tablet 1   zinc sulfate 220 (50 Zn) MG capsule Take 1 capsule (220 mg total) by mouth daily. 42 capsule 0   docusate sodium (COLACE) 100 MG capsule Take 1 capsule (100 mg total) by mouth 2 (two) times daily. (Patient not taking: Reported on 01/16/2019) 10 capsule 0   oxyCODONE-acetaminophen (PERCOCET) 5-325 MG tablet Take 1-2 tablets by mouth every 4 (four) hours as needed for severe pain. (Patient not taking: Reported on 01/16/2019) 30 tablet 0   polyethylene glycol (MIRALAX / GLYCOLAX) packet Take 17 g by mouth daily as needed for mild constipation. (Patient not taking: Reported on 01/16/2019) 14 each 0   sertraline (ZOLOFT) 50 MG tablet TAKE 1 TABLET (50 MG TOTAL) BY MOUTH DAILY. 90 tablet 1   No current facility-administered medications for this  visit.     PHYSICAL EXAMINATION: ECOG PERFORMANCE STATUS: 1 - Symptomatic but completely ambulatory  Vitals:   01/16/19 1043  BP: (!) 142/58  Pulse: (!) 58  Resp: 20  Temp: 98.3 F (36.8 C)  SpO2: 97%   Filed Weights   01/16/19 1043  Weight: 161 lb 4.8 oz (73.2 kg)    GENERAL:alert, no distress and comfortable SKIN: skin color, texture, turgor are normal, no rashes or significant lesions (+) Skin lesion on breast  EYES: normal, Conjunctiva are pink and non-injected, sclera clear  NECK: supple, thyroid normal size, non-tender, without nodularity LYMPH:  no palpable lymphadenopathy in the cervical, axillary  LUNGS: clear to auscultation and percussion with normal breathing effort HEART: regular rate & rhythm and no murmurs and no lower extremity edema ABDOMEN:abdomen soft, non-tender and normal bowel sounds Musculoskeletal:no cyanosis of digits and no clubbing  NEURO: alert &  oriented x 3 with fluent speech, no focal motor/sensory deficits BREAST: S/p right lumpectomy: Surgical incision healed well. No palpable mass, nodules or adenopathy bilaterally. Breast exam benign.   LABORATORY DATA:  I have reviewed the data as listed CBC Latest Ref Rng & Units 01/16/2019 06/17/2018 04/24/2018  WBC 4.0 - 10.5 K/uL 9.5 7.3 14.1(H)  Hemoglobin 12.0 - 15.0 g/dL 13.5 13.2 8.6(L)  Hematocrit 36.0 - 46.0 % 43.1 44.6 27.1(L)  Platelets 150 - 400 K/uL 224 232 209     CMP Latest Ref Rng & Units 01/16/2019 06/17/2018 04/23/2018  Glucose 70 - 99 mg/dL 94 83 148(H)  BUN 8 - 23 mg/dL _0 Creatinine 0.44 - 1.00 mg/dL 0.83 0.77 0.72  Sodium 135 - 145 mmol/L 143 141 135  Potassium 3.5 - 5.1 mmol/L 4.6 3.9 3.7  Chloride 98 - 111 mmol/L 106 103 99  CO2 22 - 32 mmol/L _1 Calcium 8.9 - 10.3 mg/dL 10.1 8.9 8.5(L)  Total Protein 6.5 - 8.1 g/dL 6.7 - -  Total Bilirubin 0.3 - 1.2 mg/dL 0.3 - -  Alkaline Phos 38 - 126 U/L 202(H) - -  AST 15 - 41 U/L 17 - -  ALT 0 - 44 U/L 14 - -      RADIOGRAPHIC STUDIES: I have personally reviewed the radiological images as listed and agreed with the findings in the report. No results found.   ASSESSMENT & PLAN:  Teresa Frey is a 85 y.o. female with   1. Breast cancer of upper-outer quadrant of right breast, invasive ductal carcinoma, pT1CN0M0, stage IA, ER+/PR+/HER2- -She was diagnosed in 01/2016. She is s/p right breast lumpectomy.  -Giving the low Ki-67 and agree 1 disease, I think this is likely a low risk cancer. I do not recommend adjuvant chemotherapy or Oncotype. -She started antiestrogen with Anastrozole in 02/2016. Tolerating moderately well with arthralgia. Will continue for 5 years total. -She is clinically doing well. Lab reviewed, her CBC and CMP are within normal limits except Alk 202. Her physical exam and her 12/2017 mammogram were unremarkable. There is no clinical concern for recurrence. -Continue surveillance. She had  Mammogram in 12/2018 with [facility], will request report.  -She will continue Anastrozole -F/u in 6 months   2. Genetic Testing was negative  3. HTN, AFib  -She was on hydrochlorothiazide for hypertension before, which was held when she had a 2 fibrillation. She is on low-dose metoprolol. -Her blood pressure has been high in my office, I strongly encouraged her to monitor at home, and contact her primary care physician if  remains to be high.  -Given her Afib episode on 06/28/17, she was placed on Lopressor 12.17m BID and Eliquis 5 mg BID  4. Arthralgia, Joint Stiffness -Chronic left hip pain. Has lower back pain. Follows with orthopedics. She ambulates with cane.  -Joint pain has slightly increased with anastrozole with hands stiffness. Manageable.  -Taking meloxicam  6. Bone health, 2019 left femur fracture  -I previously spoke with the patient about the risk for decreased strength in bones with anastrozole -Had fall in 04/2018 that resulted in left femur fracture. She was treated with surgery. She has healed but ambulates with cane now.   -01/22/17 DEXA was normal..  -Continue Calcium and Vit D.  -Repeat DEXA in 2021.    Plan -Clinically doing well -Continue anastrozole -Lab and f/u in 6 months    No problem-specific Assessment & Plan notes found for this encounter.   No orders of the defined types were placed in this encounter.  All questions were answered. The patient knows to call the clinic with any problems, questions or concerns. No barriers to learning was detected. I spent 15 minutes counseling the patient face to face. The total time spent in the appointment was 20 minutes and more than 50% was on counseling and review of test results     YTruitt Merle MD 01/16/2019   I, AJoslyn Devon am acting as scribe for YTruitt Merle MD.   I have reviewed the above documentation for accuracy and completeness, and I agree with the above.

## 2019-01-15 ENCOUNTER — Telehealth: Payer: Self-pay

## 2019-01-15 DIAGNOSIS — Z853 Personal history of malignant neoplasm of breast: Secondary | ICD-10-CM | POA: Diagnosis not present

## 2019-01-15 LAB — HM MAMMOGRAPHY

## 2019-01-15 NOTE — Telephone Encounter (Signed)
Faxed over signed order to [facility], sent to HIM for scanning to chart.

## 2019-01-16 ENCOUNTER — Telehealth: Payer: Self-pay | Admitting: Hematology

## 2019-01-16 ENCOUNTER — Inpatient Hospital Stay: Payer: Medicare Other | Attending: Hematology

## 2019-01-16 ENCOUNTER — Other Ambulatory Visit: Payer: Self-pay

## 2019-01-16 ENCOUNTER — Encounter: Payer: Self-pay | Admitting: Hematology

## 2019-01-16 ENCOUNTER — Inpatient Hospital Stay (HOSPITAL_BASED_OUTPATIENT_CLINIC_OR_DEPARTMENT_OTHER): Payer: Medicare Other | Admitting: Hematology

## 2019-01-16 VITALS — BP 142/58 | HR 58 | Temp 98.3°F | Resp 20 | Ht 62.0 in | Wt 161.3 lb

## 2019-01-16 DIAGNOSIS — I10 Essential (primary) hypertension: Secondary | ICD-10-CM | POA: Insufficient documentation

## 2019-01-16 DIAGNOSIS — Z170 Estrogen receptor positive status [ER+]: Secondary | ICD-10-CM | POA: Insufficient documentation

## 2019-01-16 DIAGNOSIS — Z7901 Long term (current) use of anticoagulants: Secondary | ICD-10-CM | POA: Diagnosis not present

## 2019-01-16 DIAGNOSIS — Z79811 Long term (current) use of aromatase inhibitors: Secondary | ICD-10-CM | POA: Diagnosis not present

## 2019-01-16 DIAGNOSIS — I4891 Unspecified atrial fibrillation: Secondary | ICD-10-CM | POA: Insufficient documentation

## 2019-01-16 DIAGNOSIS — C50411 Malignant neoplasm of upper-outer quadrant of right female breast: Secondary | ICD-10-CM

## 2019-01-16 DIAGNOSIS — Z79899 Other long term (current) drug therapy: Secondary | ICD-10-CM | POA: Insufficient documentation

## 2019-01-16 LAB — CBC WITH DIFFERENTIAL (CANCER CENTER ONLY)
Abs Immature Granulocytes: 0.04 10*3/uL (ref 0.00–0.07)
Basophils Absolute: 0 10*3/uL (ref 0.0–0.1)
Basophils Relative: 0 %
Eosinophils Absolute: 0.2 10*3/uL (ref 0.0–0.5)
Eosinophils Relative: 2 %
HCT: 43.1 % (ref 36.0–46.0)
Hemoglobin: 13.5 g/dL (ref 12.0–15.0)
Immature Granulocytes: 0 %
Lymphocytes Relative: 28 %
Lymphs Abs: 2.6 10*3/uL (ref 0.7–4.0)
MCH: 29.2 pg (ref 26.0–34.0)
MCHC: 31.3 g/dL (ref 30.0–36.0)
MCV: 93.1 fL (ref 80.0–100.0)
Monocytes Absolute: 0.7 10*3/uL (ref 0.1–1.0)
Monocytes Relative: 8 %
Neutro Abs: 5.9 10*3/uL (ref 1.7–7.7)
Neutrophils Relative %: 62 %
Platelet Count: 224 10*3/uL (ref 150–400)
RBC: 4.63 MIL/uL (ref 3.87–5.11)
RDW: 13.8 % (ref 11.5–15.5)
WBC Count: 9.5 10*3/uL (ref 4.0–10.5)
nRBC: 0 % (ref 0.0–0.2)

## 2019-01-16 LAB — CMP (CANCER CENTER ONLY)
ALT: 14 U/L (ref 0–44)
AST: 17 U/L (ref 15–41)
Albumin: 3.4 g/dL — ABNORMAL LOW (ref 3.5–5.0)
Alkaline Phosphatase: 202 U/L — ABNORMAL HIGH (ref 38–126)
Anion gap: 8 (ref 5–15)
BUN: 22 mg/dL (ref 8–23)
CO2: 29 mmol/L (ref 22–32)
Calcium: 10.1 mg/dL (ref 8.9–10.3)
Chloride: 106 mmol/L (ref 98–111)
Creatinine: 0.83 mg/dL (ref 0.44–1.00)
GFR, Est AFR Am: >60 mL/min (ref >60)
GFR, Estimated: >60 mL/min (ref >60)
Glucose, Bld: 94 mg/dL (ref 70–99)
Potassium: 4.6 mmol/L (ref 3.5–5.1)
Sodium: 143 mmol/L (ref 135–145)
Total Bilirubin: 0.3 mg/dL (ref 0.3–1.2)
Total Protein: 6.7 g/dL (ref 6.5–8.1)

## 2019-01-16 NOTE — Telephone Encounter (Signed)
Gave patient avs report and appointments for February 2021.

## 2019-01-20 ENCOUNTER — Encounter: Payer: Self-pay | Admitting: Family Medicine

## 2019-01-20 ENCOUNTER — Other Ambulatory Visit: Payer: Self-pay | Admitting: Family Medicine

## 2019-01-22 ENCOUNTER — Ambulatory Visit (INDEPENDENT_AMBULATORY_CARE_PROVIDER_SITE_OTHER): Payer: Medicare Other

## 2019-01-22 ENCOUNTER — Encounter: Payer: Self-pay | Admitting: Orthopaedic Surgery

## 2019-01-22 ENCOUNTER — Ambulatory Visit (INDEPENDENT_AMBULATORY_CARE_PROVIDER_SITE_OTHER): Payer: Medicare Other | Admitting: Orthopaedic Surgery

## 2019-01-22 ENCOUNTER — Other Ambulatory Visit: Payer: Self-pay

## 2019-01-22 DIAGNOSIS — M545 Low back pain, unspecified: Secondary | ICD-10-CM

## 2019-01-22 NOTE — Progress Notes (Signed)
Office Visit Note   Patient: Teresa Frey           Date of Birth: December 19, 1929           MRN: [id number] Visit Date: 01/22/2019              Requested by: [name], [name], MD [address] [place],  [place] [address] PCP: [name], [name], MD   Assessment & Plan: Visit Diagnoses:  1. Acute low back pain, unspecified back pain laterality, unspecified whether sciatica present     Plan: Impression is chronic midline low back pain.  We will refer the patient to Dr. Ernestina Patches for a repeat ESI.  She will follow-up with [place] as needed.  Follow-Up Instructions: Return if symptoms worsen or fail to improve.   Orders:  Orders Placed This Encounter  Procedures  . XR Lumbar Spine 2-3 Views  . Ambulatory referral to Physical Medicine Rehab   No orders of the defined types were placed in this encounter.     Procedures: No procedures performed   Clinical Data: No additional findings.   Subjective: Chief Complaint  Patient presents with  . Lower Back - Pain    HPI patient is a pleasant 85 year old female who presents our clinic today with recurrent midline lower back pain.  She describes this as a constant ache worse with any movement.  She has been taking Tylenol without relief of symptoms.  She denies any radicular symptoms.  No bowel or bladder change and no saddle paresthesias.  She notes that she had an ESI about 10 to 15 years ago which significantly helped until recently.  She is also currently on warfarin for A. fib.  Review of Systems as detailed in HPI.  All others reviewed and are negative.   Objective: Vital Signs: There were no vitals taken for this visit.  Physical Exam well-developed and well-nourished female in no acute distress.  Alert and oriented x3.  Ortho Exam examination of her lower back reveals no spinous or paraspinous tenderness.  Mildly positive straight leg raise both sides.  No focal weakness.  She is neurovascular intact distally.  Specialty  Comments:  No specialty comments available.  Imaging: Xr Lumbar Spine 2-3 Views  Result Date: 01/22/2019 Significant disc space narrowing L3-S1    PMFS History: Patient Active Problem List   Diagnosis Date Noted  . Pain of left hip joint 12/23/2018  . Primary osteoarthritis of left knee 12/23/2018  . Periprosthetic fracture around internal prosthetic left hip joint ([place]) 04/19/2018  . Long term (current) use of anticoagulants [Z79.01] 08/14/2017  . Genetic testing 04/01/2016  . possible sleep apnea by history 02/12/2016  . A-fib ([facility]) 02/11/2016  . Surgical wound infection 02/11/2016  . Breast cancer of upper-outer quadrant of right female breast ([place]) 01/11/2016  . Impaired glucose tolerance 07/28/2014  . Anxiety disorder, unspecified 04/07/2007  . Osteoarthritis 04/07/2007  . Essential hypertension 04/02/2007  . History of colonic polyps 04/02/2007  . DIVERTICULITIS, HX OF 04/02/2007   Past Medical History:  Diagnosis Date  . ANXIETY 04/07/2007  . Breast cancer of upper-outer quadrant of right female breast ([facility]) 01/11/2016  . COLONIC POLYPS, HX OF 04/02/2007   adenomatous  . DIVERTICULITIS, HX OF 04/02/2007  . Diverticulosis   . DJD (degenerative joint disease)   . Hemorrhoids   . HYPERTENSION 04/02/2007  . OSTEOARTHRITIS, SEVERE 04/07/2007  . 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 WEIGHT GAIN 11/13/2007  Family History  Problem Relation Age of Onset  . Pancreatic cancer Mother 71       d. 66  . Cancer Father 12       hodgkin's lymphoma and leukemia; d. 43  . Colon cancer Brother        dx. late 21s; smoker  . Pancreatic cancer Brother 80       d. 54; smoker  . Lung cancer Brother 40       smoker  . Heart attack Brother        d. late 54s  . Colon cancer Paternal Aunt   . Breast cancer Daughter 65       negative genetic testing in 2016  . Skin cancer Daughter        non-melanoma type; +sun exposure  . Kidney failure Son 72       +EtOH abuse  resulting in liver, kidney, and pancreas failure  . Diabetes Maternal Uncle        d. later age  . Heart disease Neg Hx        family  . Esophageal cancer Neg Hx   . Rectal cancer Neg Hx   . Stomach cancer Neg Hx     Past Surgical History:  Procedure Laterality Date  . APPENDECTOMY    . BREAST LUMPECTOMY WITH RADIOACTIVE SEED LOCALIZATION Right 02/08/2016   Procedure: RIGHT BREAST LUMPECTOMY WITH RADIOACTIVE SEED LOCALIZATION;  Surgeon: Fanny Skates, MD;  Location: [facility];  Service: General;  Laterality: Right;  . ORIF FEMUR FRACTURE Left 04/21/2018   Procedure: OPEN REDUCTION INTERNAL FIXATION (ORIF) PERI PROSTHETIC FEMUR FRACTURE;  Surgeon: Leandrew Koyanagi, MD;  Location: [facility];  Service: Orthopedics;  Laterality: Left;  . TONSILLECTOMY    . TOTAL HIP ARTHROPLASTY  1999, left hip    2006 right hip  . TUBAL LIGATION     Social History   Occupational History  . Not on file  Tobacco Use  . Smoking status: Former Smoker    Packs/day: 1.00    Years: 27.00    Pack years: 27.00    Types: Cigarettes    Quit date: 06/04/1978    Years since quitting: 40.6  . Smokeless tobacco: Never Used  Substance and Sexual Activity  . Alcohol use: Yes    Alcohol/week: 7.0 standard drinks    Types: 7 Glasses of wine per week    Comment: 1-2 glasses wine after dinner, but not necessarily each day  . Drug use: No  . Sexual activity: Not on file

## 2019-01-23 ENCOUNTER — Other Ambulatory Visit: Payer: Self-pay

## 2019-01-23 ENCOUNTER — Ambulatory Visit (INDEPENDENT_AMBULATORY_CARE_PROVIDER_SITE_OTHER): Payer: Medicare Other | Admitting: *Deleted

## 2019-01-23 DIAGNOSIS — I4891 Unspecified atrial fibrillation: Secondary | ICD-10-CM

## 2019-01-23 DIAGNOSIS — Z7901 Long term (current) use of anticoagulants: Secondary | ICD-10-CM

## 2019-01-23 LAB — POCT INR: INR: 2.2 (ref 2.0–3.0)

## 2019-01-23 NOTE — Patient Instructions (Signed)
Description   Continue with 2 tablets daily Repeat INR in 3 weeks.

## 2019-02-10 DIAGNOSIS — H02052 Trichiasis without entropian right lower eyelid: Secondary | ICD-10-CM | POA: Diagnosis not present

## 2019-02-10 DIAGNOSIS — H524 Presbyopia: Secondary | ICD-10-CM | POA: Diagnosis not present

## 2019-02-10 DIAGNOSIS — H26492 Other secondary cataract, left eye: Secondary | ICD-10-CM | POA: Diagnosis not present

## 2019-02-10 DIAGNOSIS — H02055 Trichiasis without entropian left lower eyelid: Secondary | ICD-10-CM | POA: Diagnosis not present

## 2019-02-12 ENCOUNTER — Encounter: Payer: Self-pay | Admitting: Physical Medicine and Rehabilitation

## 2019-02-12 ENCOUNTER — Ambulatory Visit: Payer: Self-pay

## 2019-02-12 ENCOUNTER — Ambulatory Visit (INDEPENDENT_AMBULATORY_CARE_PROVIDER_SITE_OTHER): Payer: Medicare Other | Admitting: Physical Medicine and Rehabilitation

## 2019-02-12 VITALS — BP 143/68 | HR 56

## 2019-02-12 DIAGNOSIS — M545 Low back pain, unspecified: Secondary | ICD-10-CM

## 2019-02-12 DIAGNOSIS — G8929 Other chronic pain: Secondary | ICD-10-CM

## 2019-02-12 DIAGNOSIS — M4316 Spondylolisthesis, lumbar region: Secondary | ICD-10-CM | POA: Insufficient documentation

## 2019-02-12 DIAGNOSIS — M47816 Spondylosis without myelopathy or radiculopathy, lumbar region: Secondary | ICD-10-CM | POA: Diagnosis not present

## 2019-02-12 MED ORDER — METHYLPREDNISOLONE ACETATE 80 MG/ML IJ SUSP
80.0000 mg | Freq: Once | INTRAMUSCULAR | Status: AC
  Administered 2019-02-12: 80 mg

## 2019-02-12 NOTE — Procedures (Signed)
Lumbar Facet Joint Intra-Articular Injection(s) with Fluoroscopic Guidance  Patient: Teresa Frey      Date of Birth: 07-Oct-1929 MRN: [id number] PCP: [name], [name], MD      Visit Date: 02/12/2019   Universal Protocol:    Date/Time: 02/12/2019  Consent Given By: the patient  Position: PRONE   Additional Comments: Vital signs were monitored before and after the procedure. Patient was prepped and draped in the usual sterile fashion. The correct patient, procedure, and site was verified.   Injection Procedure Details:  Procedure Site One Meds Administered:  Meds ordered this encounter  Medications  . methylPREDNISolone acetate (DEPO-MEDROL) injection 80 mg     Laterality: Bilateral  Location/Site: Tricky numbering scheme for her lumbar spine with almost fully sacralized L5 segment. L4-L5  Needle size: 22 guage  Needle type: Spinal  Needle Placement: Articular  Findings:  -Comments: Excellent flow of contrast producing a partial arthrogram.  Procedure Details: The fluoroscope beam is vertically oriented in AP, and the inferior recess is visualized beneath the lower pole of the inferior apophyseal process, which represents the target point for needle insertion. When direct visualization is difficult the target point is located at the medial projection of the vertebral pedicle. The region overlying each aforementioned target is locally anesthetized with a 1 to 2 ml. volume of 1% Lidocaine without Epinephrine.   The spinal needle was inserted into each of the above mentioned facet joints using biplanar fluoroscopic guidance. A 0.25 to 0.5 ml. volume of Isovue-250 was injected and a partial facet joint arthrogram was obtained. A single spot film was obtained of the resulting arthrogram.    One to 1.25 ml of the steroid/anesthetic solution was then injected into each of the facet joints noted above.   Additional Comments:  The patient tolerated the procedure well Dressing: 2  x 2 sterile gauze and Band-Aid    Post-procedure details: Patient was observed during the procedure. Post-procedure instructions were reviewed.  Patient left the clinic in stable condition.

## 2019-02-12 NOTE — Progress Notes (Signed)
 .  Numeric Pain Rating Scale and Functional Assessment Average Pain 6   In the last MONTH (on 0-10 scale) has pain interfered with the following?  1. General activity like being  able to carry out your everyday physical activities such as walking, climbing stairs, carrying groceries, or moving a chair?  Rating(8)   +Driver, +BT(warfarin, ok for inj), -Dye Allergies.

## 2019-02-12 NOTE — Progress Notes (Signed)
Teresa Frey - 84 y.o. female MRN [id number]  Date of birth: 09-20-1929  Office Visit Note: Visit Date: 02/12/2019 PCP: [name], [name], MD Referred by: [name], [name], MD  Subjective: Chief Complaint  Patient presents with   Lower Back - Pain   HPI:  Teresa Frey is a 85 y.o. female who comes in today At the request of Teresa Frey for lumbar spine interventional procedure.  He has been seeing the patient since last year and she had a periprosthetic hip fracture.  She has had a history of bilateral total hip replacements.  She has been seen and followed by several of the orthopedic surgeons in our office.  She comes in today at his request to have low back pain axial right across the low back worse with standing and ambulating and better at rest.  Really is failed conservative care with activity modification and medication management.  She actually saw Dr. Marlou Sa for similar complaints in 2015 and MRI was obtained at that time and as reviewed below.  She has had more recent x-ray with Dr. Erlinda Hong not showing anything real concerning on the x-ray.  She does have an interesting transitional segment with small ribs at T12 and without numbering scheme and almost fully sacralized L5 lumbar segment.  She does have facet arthropathy on the MRI and really her exam and history today is more consistent with facet mediated back pain.  She has pain with standing and facet loading on exam.  She does ambulate with a cane with an antalgic gait.  We are going to complete bilateral L4-5 facet joint blocks based on the numbering scheme of the MRI.  Interestingly when she had similar issues epidural injection at L5-S1 was performed as a series of injections probably in 2007 or 2008.  If she just does not get relief with facet block then would look potentially at epidural injection.  We will try to maintain this in terms of a good comprehensive orthopedic pain program.  Consideration given to second diagnostic medial  branch block and potential for radiofrequency ablation.  ROS Otherwise per HPI.  Assessment & Plan: Visit Diagnoses:  1. Spondylosis without myelopathy or radiculopathy, lumbar region   2. Chronic bilateral low back pain without sciatica   3. Spondylolisthesis of lumbar region     Plan: No additional findings.   Meds & Orders:  Meds ordered this encounter  Medications   methylPREDNISolone acetate (DEPO-MEDROL) injection 80 mg    Orders Placed This Encounter  Procedures   Facet Injection   XR C-ARM NO REPORT    Follow-up: Return if symptoms worsen or fail to improve.   Procedures: No procedures performed  Lumbar Facet Joint Intra-Articular Injection(s) with Fluoroscopic Guidance  Patient: Teresa Frey      Date of Birth: Jul 06, 1929 MRN: [id number] PCP: [name], [name], MD      Visit Date: 02/12/2019   Universal Protocol:    Date/Time: 02/12/2019  Consent Given By: the patient  Position: PRONE   Additional Comments: Vital signs were monitored before and after the procedure. Patient was prepped and draped in the usual sterile fashion. The correct patient, procedure, and site was verified.   Injection Procedure Details:  Procedure Site One Meds Administered:  Meds ordered this encounter  Medications   methylPREDNISolone acetate (DEPO-MEDROL) injection 80 mg     Laterality: Bilateral  Location/Site: Tricky numbering scheme for her lumbar spine with almost fully sacralized L5 segment. L4-L5  Needle size:  60 guage  Needle type: Spinal  Needle Placement: Articular  Findings:  -Comments: Excellent flow of contrast producing a partial arthrogram.  Procedure Details: The fluoroscope beam is vertically oriented in AP, and the inferior recess is visualized beneath the lower pole of the inferior apophyseal process, which represents the target point for needle insertion. When direct visualization is difficult the target point is located at the medial  projection of the vertebral pedicle. The region overlying each aforementioned target is locally anesthetized with a 1 to 2 ml. volume of 1% Lidocaine without Epinephrine.   The spinal needle was inserted into each of the above mentioned facet joints using biplanar fluoroscopic guidance. A 0.25 to 0.5 ml. volume of Isovue-250 was injected and a partial facet joint arthrogram was obtained. A single spot film was obtained of the resulting arthrogram.    One to 1.25 ml of the steroid/anesthetic solution was then injected into each of the facet joints noted above.   Additional Comments:  The patient tolerated the procedure well Dressing: 2 x 2 sterile gauze and Band-Aid    Post-procedure details: Patient was observed during the procedure. Post-procedure instructions were reviewed.  Patient left the clinic in stable condition.   Clinical History: MRI LUMBAR SPINE WITHOUT CONTRAST  TECHNIQUE: Multiplanar, multisequence MR imaging of the lumbar spine was performed. No intravenous contrast was administered.  COMPARISON:  Chest radiographs 08/10/2004 and 07/30/2013. Abdominal CT 04/09/2007.  FINDINGS: There is transitional lumbosacral anatomy. Prior chest radiographs demonstrate small ribs at T12. This CT demonstrates a nearly fully sacralized L5 segment. The last open disc space is L4-5. The alignment is near anatomic. There is a grade 1 anterolisthesis at L3-4 and L4-5 secondary to facet disease. Diffuse pagetoid changes within the L4 and L5 vertebral bodies and upper sacrum are grossly stable. There is no evidence of pathologic fracture or soft tissue mass. Schmorl's node involving the superior endplate of [id number] has developed since the prior CT, although appears healed without marrow edema.  The conus medullaris extends to the L1 level and appears normal. No paraspinal abnormalities are identified.  No significant disc space findings are demonstrated from T10-11 through  T12-L1.  L1-2: Mild disc bulging and facet hypertrophy. No spinal stenosis or nerve root encroachment.  L2-3: There is chronic loss of disc height with small posterior osteophytes and mild bilateral facet hypertrophy. No significant spinal stenosis or nerve root encroachment.  L3-4: Stable disc bulging, facet and ligamentous hypertrophy. There is mild triangulation of the thecal sac without nerve root encroachment. The foramina are patent.  L4-5: Given the transitional anatomy, this is morphologically the lumbosacral junction. There is chronic disc space loss with posterior osteophytes, facet and ligamentous hypertrophy. No spinal stenosis or nerve root encroachment results.  L5-S1: The disc space between the transitional L5 segment in the upper sacrum is fused. There is no spinal stenosis or nerve root encroachment. Diffuse pagetoid changes are grossly stable.  IMPRESSION: 1. Transitional lumbosacral anatomy. As correlated with prior radiographs/CT, the L5 segment is largely sacralized. 2. Interval superior endplate Schmorl's node at T12, well healed. No acute osseous findings. 3. Stable pagetoid changes with lower lumbar spine and sacrum. No evidence of malignant degeneration. 4. Stable chronic degenerative disc disease at L2-3 and L4-5. No acute findings, spinal stenosis or nerve root encroachment identified.   Electronically Signed   By: Camie Patience M.D.   On: 10/24/2013 11:52     Objective:  VS:  HT:     WT:  BMI:      BP:(!) 143/68   HR:(!) 56bpm   TEMP: ( )   RESP:  Physical Exam  Ortho Exam Imaging: Xr C-arm No Report  Result Date: 02/12/2019 Please see Notes tab for imaging impression.

## 2019-02-13 ENCOUNTER — Ambulatory Visit (INDEPENDENT_AMBULATORY_CARE_PROVIDER_SITE_OTHER): Payer: Medicare Other | Admitting: Pharmacist

## 2019-02-13 ENCOUNTER — Other Ambulatory Visit: Payer: Self-pay

## 2019-02-13 DIAGNOSIS — Z7901 Long term (current) use of anticoagulants: Secondary | ICD-10-CM | POA: Diagnosis not present

## 2019-02-13 DIAGNOSIS — I4891 Unspecified atrial fibrillation: Secondary | ICD-10-CM | POA: Diagnosis not present

## 2019-02-13 LAB — POCT INR: INR: 1.9 — AB (ref 2.0–3.0)

## 2019-02-19 DIAGNOSIS — H26492 Other secondary cataract, left eye: Secondary | ICD-10-CM | POA: Diagnosis not present

## 2019-03-02 ENCOUNTER — Other Ambulatory Visit: Payer: Self-pay | Admitting: Cardiology

## 2019-03-13 ENCOUNTER — Ambulatory Visit (INDEPENDENT_AMBULATORY_CARE_PROVIDER_SITE_OTHER): Payer: Medicare Other | Admitting: Pharmacist Clinician (PhC)/ Clinical Pharmacy Specialist

## 2019-03-13 ENCOUNTER — Other Ambulatory Visit: Payer: Self-pay

## 2019-03-13 DIAGNOSIS — Z7901 Long term (current) use of anticoagulants: Secondary | ICD-10-CM

## 2019-03-13 DIAGNOSIS — I4891 Unspecified atrial fibrillation: Secondary | ICD-10-CM

## 2019-03-13 LAB — POCT INR: INR: 2.1 (ref 2.0–3.0)

## 2019-03-22 DIAGNOSIS — Z23 Encounter for immunization: Secondary | ICD-10-CM | POA: Diagnosis not present

## 2019-04-10 ENCOUNTER — Ambulatory Visit: Payer: Medicare Other | Admitting: Orthopaedic Surgery

## 2019-04-12 ENCOUNTER — Emergency Department (HOSPITAL_COMMUNITY): Payer: Medicare Other

## 2019-04-12 ENCOUNTER — Encounter (HOSPITAL_COMMUNITY): Payer: Self-pay | Admitting: *Deleted

## 2019-04-12 ENCOUNTER — Emergency Department (HOSPITAL_COMMUNITY)
Admission: EM | Admit: 2019-04-12 | Discharge: 2019-04-12 | Disposition: A | Discharge status: Home / Self Care | Payer: Medicare Other | Attending: Emergency Medicine | Admitting: Emergency Medicine

## 2019-04-12 ENCOUNTER — Other Ambulatory Visit: Payer: Self-pay

## 2019-04-12 DIAGNOSIS — S0990XA Unspecified injury of head, initial encounter: Secondary | ICD-10-CM | POA: Diagnosis not present

## 2019-04-12 DIAGNOSIS — Y9301 Activity, walking, marching and hiking: Secondary | ICD-10-CM | POA: Insufficient documentation

## 2019-04-12 DIAGNOSIS — Y92018 Other place in single-family (private) house as the place of occurrence of the external cause: Secondary | ICD-10-CM | POA: Diagnosis not present

## 2019-04-12 DIAGNOSIS — Z87891 Personal history of nicotine dependence: Secondary | ICD-10-CM | POA: Insufficient documentation

## 2019-04-12 DIAGNOSIS — S32010A Wedge compression fracture of first lumbar vertebra, initial encounter for closed fracture: Secondary | ICD-10-CM | POA: Diagnosis not present

## 2019-04-12 DIAGNOSIS — W108XXA Fall (on) (from) other stairs and steps, initial encounter: Secondary | ICD-10-CM | POA: Diagnosis not present

## 2019-04-12 DIAGNOSIS — Z79899 Other long term (current) drug therapy: Secondary | ICD-10-CM | POA: Diagnosis not present

## 2019-04-12 DIAGNOSIS — W19XXXA Unspecified fall, initial encounter: Secondary | ICD-10-CM

## 2019-04-12 DIAGNOSIS — S22080D Wedge compression fracture of T11-T12 vertebra, subsequent encounter for fracture with routine healing: Secondary | ICD-10-CM | POA: Diagnosis not present

## 2019-04-12 DIAGNOSIS — Z853 Personal history of malignant neoplasm of breast: Secondary | ICD-10-CM | POA: Diagnosis not present

## 2019-04-12 DIAGNOSIS — S32020D Wedge compression fracture of second lumbar vertebra, subsequent encounter for fracture with routine healing: Secondary | ICD-10-CM | POA: Diagnosis not present

## 2019-04-12 DIAGNOSIS — M545 Low back pain: Secondary | ICD-10-CM | POA: Diagnosis not present

## 2019-04-12 DIAGNOSIS — Z96641 Presence of right artificial hip joint: Secondary | ICD-10-CM | POA: Diagnosis not present

## 2019-04-12 DIAGNOSIS — I10 Essential (primary) hypertension: Secondary | ICD-10-CM | POA: Insufficient documentation

## 2019-04-12 DIAGNOSIS — Z7901 Long term (current) use of anticoagulants: Secondary | ICD-10-CM | POA: Insufficient documentation

## 2019-04-12 DIAGNOSIS — S32000S Wedge compression fracture of unspecified lumbar vertebra, sequela: Secondary | ICD-10-CM | POA: Diagnosis not present

## 2019-04-12 DIAGNOSIS — Y998 Other external cause status: Secondary | ICD-10-CM | POA: Insufficient documentation

## 2019-04-12 DIAGNOSIS — S32010D Wedge compression fracture of first lumbar vertebra, subsequent encounter for fracture with routine healing: Secondary | ICD-10-CM | POA: Diagnosis not present

## 2019-04-12 NOTE — ED Notes (Signed)
Patient ambulated to bathroom with 1 assistance.

## 2019-04-12 NOTE — ED Triage Notes (Signed)
Pt fell PTA down 4 steps forward, hitting forehead, she is on blood thinners. She denies LOC. Family was called to come help her up. She complains of back pain, red area to head, bruising to left hand. C-Collar place on pt during triage

## 2019-04-12 NOTE — Discharge Instructions (Addendum)
You were seen in the ER after a fall  Head CT is normal  CT of lumbar spine shows previous compression fracture in your lower back, fracture on L1 is slightly worse. This could have been from today's fall.  There are no other complicating features or injuries otherwise.   Treatment includes pain control, rest, close orthopedic follow up  Take [phone] mg acetaminophen ever 6 hours for pain.  Use over the counter lidocaine and diclofenac gel on the area.  Try ice or heat, whichever helps with pain  Return to ER for worsening severe back pain, loss of sensation in genital/groin area, weakness to extremities

## 2019-04-12 NOTE — ED Notes (Signed)
Patient transported to CT 

## 2019-04-12 NOTE — ED Provider Notes (Signed)
[facility] DEPT Provider Note   CSN: [id number] Arrival date & time: 04/12/19  1437     History   Chief Complaint Chief Complaint  Patient presents with  . Fall    HPI Teresa Frey is a 85 y.o. female with pertinent pmh atrial fibrillation on coumadin, chronic low back pain here for evaluation of injuries sustained after a fall.  She had walked down her deck to get a package and walking up the steps when she tripped and fell down approximately 4 steps. States everything happened so fast she is not sure if she hit her head but has an abrasion from her glasses on her forehead with mild soreness.  Has associated low back pain.  Her daughter helped her up, she has been ambulatory since with low back pain. Denies other injuries.  She denies headache, vision changes or loss, neck pain, chest or abdominal pain, extremity injury.        HPI  Past Medical History:  Diagnosis Date  . ANXIETY 04/07/2007  . Breast cancer of upper-outer quadrant of right female breast ([place]) 01/11/2016  . COLONIC POLYPS, HX OF 04/02/2007   adenomatous  . DIVERTICULITIS, HX OF 04/02/2007  . Diverticulosis   . DJD (degenerative joint disease)   . Hemorrhoids   . HYPERTENSION 04/02/2007  . OSTEOARTHRITIS, SEVERE 04/07/2007  . 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 WEIGHT GAIN 11/13/2007    Patient Active Problem List   Diagnosis Date Noted  . Spondylosis without myelopathy or radiculopathy, lumbar region 02/12/2019  . Spondylolisthesis of lumbar region 02/12/2019  . Pain of left hip joint 12/23/2018  . Primary osteoarthritis of left knee 12/23/2018  . Periprosthetic fracture around internal prosthetic left hip joint ([place]) 04/19/2018  . Long term (current) use of anticoagulants [Z79.01] 08/14/2017  . Genetic testing 04/01/2016  . possible sleep apnea by history 02/12/2016  . A-fib ([place]) 02/11/2016  . Surgical wound infection 02/11/2016  . Breast cancer of  upper-outer quadrant of right female breast ([facility]) 01/11/2016  . Impaired glucose tolerance 07/28/2014  . Anxiety disorder, unspecified 04/07/2007  . Osteoarthritis 04/07/2007  . Essential hypertension 04/02/2007  . History of colonic polyps 04/02/2007  . DIVERTICULITIS, HX OF 04/02/2007    Past Surgical History:  Procedure Laterality Date  . APPENDECTOMY    . BREAST LUMPECTOMY WITH RADIOACTIVE SEED LOCALIZATION Right 02/08/2016   Procedure: RIGHT BREAST LUMPECTOMY WITH RADIOACTIVE SEED LOCALIZATION;  Surgeon: Fanny Skates, MD;  Location: [facility];  Service: General;  Laterality: Right;  . ORIF FEMUR FRACTURE Left 04/21/2018   Procedure: OPEN REDUCTION INTERNAL FIXATION (ORIF) PERI PROSTHETIC FEMUR FRACTURE;  Surgeon: Leandrew Koyanagi, MD;  Location: [facility];  Service: Orthopedics;  Laterality: Left;  . TONSILLECTOMY    . TOTAL HIP ARTHROPLASTY  1999, left hip    2006 right hip  . TUBAL LIGATION       OB History   No obstetric history on file.      Home Medications    Prior to Admission medications   Medication Sig Start Date End Date Taking? Authorizing Provider  acetaminophen (TYLENOL) 325 MG tablet Take 1-2 tablets (325-650 mg total) by mouth every 6 (six) hours as needed for mild pain (pain score 1-3 or temp > 100.5). 04/24/18   Hosie Poisson, MD  albuterol (VENTOLIN HFA) 108 (90 Base) MCG/ACT inhaler INHALE 2 PUFFS INTO THE LUNGS EVERY 6 (SIX) HOURS AS NEEDED FOR WHEEZING OR SHORTNESS  OF BREATH. 10/16/18   [name], [name], MD  anastrozole (ARIMIDEX) 1 MG tablet TAKE 1 TABLET BY MOUTH DAILY 06/16/18   Truitt Merle, MD  Ascorbic Acid (VITAMIN C) 1000 MG tablet Take 1,000 mg by mouth daily.    [provider]  BIOTIN 5000 PO Take 1 tablet by mouth daily.    [provider]  Calcium Carb-Cholecalciferol (CALCIUM 1000 + D PO) Take 1 tablet by mouth daily.    [provider]  calcium-vitamin D (OSCAL WITH D) 500-200 MG-UNIT tablet Take 1 tablet by  mouth 3 (three) times daily. 04/21/18   Leandrew Koyanagi, MD  docusate sodium (COLACE) 100 MG capsule Take 1 capsule (100 mg total) by mouth 2 (two) times daily. 04/24/18   Hosie Poisson, MD  hydrochlorothiazide (MICROZIDE) 12.5 MG capsule TAKE 1 CAPSULE BY MOUTH DAILY. 01/20/19   [name], [name], MD  metoprolol tartrate (LOPRESSOR) 25 MG tablet TAKE 1 TABLET BY MOUTH DAILY. 08/20/18   [name], MD  Multiple Vitamins-Minerals (CENTRUM SILVER ADULT 50+) TABS Take 1 tablet by mouth daily.    [provider]  omega-3 acid ethyl esters (LOVAZA) 1 g capsule Take 1 g by mouth daily.     [provider]  oxyCODONE-acetaminophen (PERCOCET) 5-325 MG tablet Take 1-2 tablets by mouth every 4 (four) hours as needed for severe pain. 04/21/18   Leandrew Koyanagi, MD  polyethylene glycol (MIRALAX / Floria Raveling) packet Take 17 g by mouth daily as needed for mild constipation. 04/24/18   Hosie Poisson, MD  sertraline (ZOLOFT) 50 MG tablet TAKE 1 TABLET (50 MG TOTAL) BY MOUTH DAILY. 01/16/19   [name], [name], MD  vitamin B-12 (CYANOCOBALAMIN) 1000 MCG tablet Take 1,000 mcg by mouth daily.    [provider]  warfarin (COUMADIN) 5 MG tablet TAKE UP TO 2 TABLETS DAILY OR AS DIRECTED. 03/02/19   [name], MD  zinc sulfate 220 (50 Zn) MG capsule Take 1 capsule (220 mg total) by mouth daily. 04/21/18   Leandrew Koyanagi, MD    Family History Family History  Problem Relation Age of Onset  . Pancreatic cancer Mother 68       d. 50  . Cancer Father 19       hodgkin's lymphoma and leukemia; d. 66  . Colon cancer Brother        dx. late 44s; smoker  . Pancreatic cancer Brother 80       d. 71; smoker  . Lung cancer Brother 40       smoker  . Heart attack Brother        d. late 8s  . Colon cancer Paternal Aunt   . Breast cancer Daughter 40       negative genetic testing in 2016  . Skin cancer Daughter        non-melanoma type; +sun exposure  . Kidney failure Son 55       +EtOH abuse  resulting in liver, kidney, and pancreas failure  . Diabetes Maternal Uncle        d. later age  . Heart disease Neg Hx        family  . Esophageal cancer Neg Hx   . Rectal cancer Neg Hx   . Stomach cancer Neg Hx     Social History Social History   Tobacco Use  . Smoking status: Former Smoker    Packs/day: 1.00    Years: 27.00    Pack years: 27.00  Types: Cigarettes    Quit date: 06/04/1978    Years since quitting: 40.8  . Smokeless tobacco: Never Used  Substance Use Topics  . Alcohol use: Yes    Alcohol/week: 7.0 standard drinks    Types: 7 Glasses of wine per week    Comment: 1-2 glasses wine after dinner, but not necessarily each day  . Drug use: No     Allergies   Hydrocodone-acetaminophen and Tramadol   Review of Systems Review of Systems  Musculoskeletal: Positive for back pain.  Skin: Positive for wound.  All other systems reviewed and are negative.    Physical Exam Updated Vital Signs BP (!) 126/52   Pulse (!) 55   Temp 98.6 F (37 C) (Oral)   Resp 18   Ht 5\' 4"  (1.626 m)   Wt 68.5 kg   SpO2 96%   BMI 25.92 kg/m   Physical Exam Constitutional:      General: She is not in acute distress.    Appearance: She is well-developed.  HENT:     Head:     Comments: The linear abrasion between eyebrows, non tender.  No facial, nasal, scalp bone tenderness.     Ears:     Comments: No Battle's sign.    Nose:     Comments: No intranasal bleeding or rhinorrhea. Septum midline    Mouth/Throat:     Comments: No intraoral bleeding or injury. No malocclusion. MMM. Dentition appears stable.  Eyes:     Conjunctiva/sclera: Conjunctivae normal.     Comments: Lids normal. EOMs and PERRL intact. No racoon's eyes   Neck:     Comments: C-spine: in cervical collar. No midline or paraspinal muscular tenderness. Full active ROM of cervical spine w/o pain. Trachea midline Cardiovascular:     Rate and Rhythm: Normal rate and regular rhythm.     Pulses:           Radial pulses are 1+ on the right side and 1+ on the left side.       Dorsalis pedis pulses are 1+ on the right side and 1+ on the left side.     Heart sounds: Normal heart sounds, S1 normal and S2 normal.  Pulmonary:     Effort: Pulmonary effort is normal.     Breath sounds: Normal breath sounds. No decreased breath sounds.  Abdominal:     Palpations: Abdomen is soft.     Tenderness: There is no abdominal tenderness.     Comments: No guarding. No seatbelt sign.   Musculoskeletal: Normal range of motion.        General: Tenderness present. No deformity.     Lumbar back: She exhibits tenderness and pain.     Comments: T-spine: no paraspinal muscular tenderness or midline tenderness.   L-spine: TTP midline lumbar and paraspinal muscle with ecchymosis and abrasion.   Pelvis: no instability with AP/L compression, leg shortening or rotation. Full PROM of hips bilaterally without pain. Negative SLR bilaterally.   Skin:    General: Skin is warm and dry.     Capillary Refill: Capillary refill takes less than 2 seconds.  Neurological:     Mental Status: She is alert, oriented to person, place, and time and easily aroused.     Comments: Speech is fluent without obvious dysarthria or dysphasia. Strength 5/5 with hand grip and ankle F/E.   Sensation to light touch intact in hands and feet.  CN II-XII grossly intact bilaterally.   Psychiatric:  Behavior: Behavior normal. Behavior is cooperative.        Thought Content: Thought content normal.      ED Treatments / Results  Labs (all labs ordered are listed, but only abnormal results are displayed) Labs Reviewed - No data to display  EKG None  Radiology Dg Lumbar Spine Complete  Result Date: 04/12/2019 CLINICAL DATA:  Lower back pain status post fall. EXAM: LUMBAR SPINE - COMPLETE 4+ VIEW COMPARISON:  Lumbar spine radiograph 01/22/2019 FINDINGS: Mild superior endplate compression deformities of the L1 and L2 vertebral bodies,  similar when compared with exam 01/22/2019. Additionally, there is similar-appearing grade 1 anterolisthesis of L5 on S1. Multilevel facet and degenerative disc disease. Vascular calcifications. Bilateral hip arthroplasties. SI joints unremarkable. Nonobstructed bowel gas pattern. IMPRESSION: 1. Mild superior endplate compression deformities of the L1 and L2 vertebral bodies, similar when compared with exam 01/22/19. Recommend correlation with point tenderness. 2. Unchanged grade 1 anterolisthesis of L5 on S1. Recommend correlation for tenderness. 3. If there is specific tenderness in either of these locations, recommend further evaluation with lumbar spine CT given the difficulty of evaluation on current plain film. Electronically Signed   By: Lovey Newcomer M.D.   On: 04/12/2019 16:25   Ct Head Wo Contrast  Result Date: 04/12/2019 CLINICAL DATA:  Fall, head trauma, warfarin EXAM: CT HEAD WITHOUT CONTRAST TECHNIQUE: Contiguous axial images were obtained from the base of the skull through the vertex without intravenous contrast. COMPARISON:  None. FINDINGS: Brain: No evidence of acute infarction, hemorrhage, hydrocephalus, extra-axial collection or mass lesion/mass effect. Mild periventricular white matter hypodensity. Vascular: No hyperdense vessel or unexpected calcification. Skull: Normal. Negative for fracture or focal lesion. Sinuses/Orbits: No acute finding. Other: None. IMPRESSION: No acute intracranial pathology.  Small-vessel white matter disease. Electronically Signed   By: Eddie Candle M.D.   On: 04/12/2019 16:22   Ct Lumbar Spine Wo Contrast  Result Date: 04/12/2019 CLINICAL DATA:  85 year old female status post fall with low back pain. EXAM: CT LUMBAR SPINE WITHOUT CONTRAST CT Lumbar Spine contrast TECHNIQUE: Multidetector CT imaging of the lumbar spine was performed without intravenous contrast administration. Multiplanar CT image reconstructions were also generated. COMPARISON:  lumbar  radiographs earlier today. Lumbar radiographs 01/30/2019, and earlier. FINDINGS: Segmentation: Transitional lumbosacral anatomy when designating the lowest full size ribs at T12. This results in a completely sacralized L5 level and is a different numbering system from the earlier radiographs today. Alignment: Stable straightening of lumbar lordosis from earlier. There is subtle anterolisthesis of L3 on L4. Vertebrae: Paget's disease of L4, L5, and the visible sacrum. Osteopenia elsewhere in the visible spine and pelvis. Sacrum and SI joints appear intact. Mild T12 and moderate L1 superior endplate compression as seen radiographically today. The L1 level loss of height does appear mildly progressed since August, although the superior endplate was already compressed at that time. L2 through L5 appear intact.  Visible lower ribs appear intact. Paraspinal and other soft tissues: Calcified aortic atherosclerosis. Vascular patency is not evaluated in the absence of IV contrast. Negative visible noncontrast abdominal viscera. There is lumbar spine subcutaneous edema. Otherwise negative paraspinal soft tissues. Disc levels: Mild for age lumbar spine degeneration. No significant lumbar spinal stenosis. IMPRESSION: 1. Transitional lumbosacral anatomy with sacralized L5 suspected, the numbering here differs from that on the radiographs earlier today. Correlation with radiographs is recommended prior to any operative intervention. 2. T12 and L1 superior endplate compression fractures which were present in August, although the L1 level may have progressed.  If specific therapy is desired, Lumbar MRI without contrast or Nuclear Medicine Whole-body Bone Scan would confirm candidacy for vertebroplasty. 3. Background osteopenia but superimposed Paget's disease of L4 through the sacrum. 4.  Aortic Atherosclerosis (ICD10-I70.0). Electronically Signed   By: Genevie Ann M.D.   On: 04/12/2019 17:38    Procedures Procedures (including  critical care time)  Medications Ordered in ED Medications - No data to display   Initial Impression / Assessment and Plan / ED Course  I have reviewed the triage vital signs and the nursing notes.  Pertinent labs & imaging results that were available during my care of the patient were reviewed by me and considered in my medical decision making (see chart for details).  Clinical Course as of Apr 11 1828  Nancy Fetter Apr 12, 2019  1632 IMPRESSION: 1. Mild superior endplate compression deformities of the L1 and L2 vertebral bodies, similar when compared with exam 01/22/19. Recommend correlation with point tenderness. 2. Unchanged grade 1 anterolisthesis of L5 on S1. Recommend correlation for tenderness. 3. If there is specific tenderness in either of these locations, recommend further evaluation with lumbar spine CT given the difficulty of evaluation on current plain film.  DG Lumbar Spine Complete [CG]    Clinical Course User Index [CG] Kinnie Feil, PA-C    85 y.o. yo female here after mechanical fall.  Reports tripping and no alarming preceding events to suggest non mechanical fall.    HD stable on arrival.  Alert. Exam reveals forehead abrasion and midline lumbar spine ecchymosis, abrasion and tenderness. She has h/o chronic back and compression fractures.   No obvious signs of significant CT-spine, chest, abdominal, pelvis, extremity injury. She is on coumadin.  X-rays and CT obtained and interpreted by me.  Possible worsening L1 compression fracture, she is point tender here.  Results given to patient.   Ambulated to bathroom without significant difficulty.  She feels steady with cane.  Will dc with tylenol, lidocaine patch, diclofenac gel.  Offered short course of oxycodone but states she has a bad reaction to oxy, hydrocodone, tramadol.  Will defer narcotics given this and age.    She has orthopedists managing ongoing back pain, recommended close f/u.  Return precautions given.  She is comfortable with this. Shared with EDP.  Final Clinical Impressions(s) / ED Diagnoses   Final diagnoses:  Fall, initial encounter  Lumbar compression fracture, sequela    ED Discharge Orders    None       Arlean Hopping 04/12/19 Estella Husk, MD 04/15/19 [phone]

## 2019-04-14 ENCOUNTER — Other Ambulatory Visit: Payer: Self-pay

## 2019-04-14 ENCOUNTER — Ambulatory Visit (INDEPENDENT_AMBULATORY_CARE_PROVIDER_SITE_OTHER): Payer: Medicare Other

## 2019-04-14 ENCOUNTER — Ambulatory Visit (INDEPENDENT_AMBULATORY_CARE_PROVIDER_SITE_OTHER): Payer: Medicare Other | Admitting: Orthopaedic Surgery

## 2019-04-14 ENCOUNTER — Encounter: Payer: Self-pay | Admitting: Orthopaedic Surgery

## 2019-04-14 DIAGNOSIS — M545 Low back pain, unspecified: Secondary | ICD-10-CM

## 2019-04-14 DIAGNOSIS — M25552 Pain in left hip: Secondary | ICD-10-CM | POA: Diagnosis not present

## 2019-04-14 MED ORDER — ACETAMINOPHEN-CODEINE #3 300-30 MG PO TABS: 1.0000 | ORAL_TABLET | Freq: Three times a day (TID) | ORAL | 1 refills | Status: DC | PRN

## 2019-04-14 NOTE — Progress Notes (Signed)
Office Visit Note   Patient: Teresa Frey           Date of Birth: 03-06-1930           MRN: [id number] Visit Date: 04/14/2019              Requested by: [name], [name], MD [address] [place],  [place] [address] PCP: [name], [name], MD   Assessment & Plan: Visit Diagnoses:  1. Acute midline low back pain, unspecified whether sciatica present   2. Pain in left hip     Plan: Impression is left hip periprosthetic femur fracture proximal to cable plate and significant lower lumbar spine pain.  In regards to the femur fracture, we will have her nonweightbearing for this.  She will follow-up with [place] in 2 weeks time for repeat evaluation and x-rays of the left femur.  In regards to the lower back pain, we will order a CT scan to further evaluate for possible fracture.  We have decided to not inject the left knee or left trochanteric bursa today.  She will call with concerns or questions anytime. Total face to face encounter time was greater than 25 minutes and over half of this time was spent in counseling and/or coordination of care.  Follow-Up Instructions: Return in about 2 weeks (around 04/28/2019).   Orders:  Orders Placed This Encounter  Procedures  . XR Lumbar Spine 2-3 Views  . XR HIP UNILAT W OR W/O PELVIS 1V LEFT  . CT LUMBAR SPINE WO CONTRAST   Meds ordered this encounter  Medications  . acetaminophen-codeine (TYLENOL #3) 300-30 MG tablet    Sig: Take 1 tablet by mouth every 8 (eight) hours as needed for moderate pain.    Dispense:  30 tablet    Refill:  1      Procedures: No procedures performed   Clinical Data: No additional findings.   Subjective: Chief Complaint  Patient presents with  . Left Hip - Pain  . Lower Back - Pain  . Left Knee - Pain    HPI patient is a pleasant 85 year old female who presents to our clinic today with lower back pain, left lateral hip pain and left knee pain.  History of ORIF left periprosthetic femur fracture  12/23/2018.  She is doing well and till recently.  She fell initially down a set of stairs this past Sunday.  She was seen in the ED where x-rays and subsequent CT scan of the lumbar spine were obtained.  No acute findings.  Once she got home following her return from the hospital she fell again in her carport.  She has had significant pain to her lower back and lateral left hip since.  She has had trouble ambulating due to pain in the left leg.  She is in a wheelchair at today's visit.  She denies any bowel or bladder change or saddle paresthesias.  She does have a history of left hip trochanteric bursitis and left knee osteoarthritis both injected with cortisone in July.  She is requesting repeat injections today.  Review of Systems as detailed in HPI.  All others reviewed and are negative.   Objective: Vital Signs: There were no vitals taken for this visit.  Physical Exam well-developed well-nourished female no acute distress.  Alert and oriented x3.  Ortho Exam examination of the left hip reveals moderate tenderness over the proximal femur and greater trochanter.  Increased pain with hip flexion and internal rotation.  She  has significant tenderness to the lower lumbar spine and paraspinous musculature with associated diffuse ecchymosis.  Increased pain with motion of the lumbar spine.  Positive straight leg raise both sides.  She is neurovascularly intact distally.  Specialty Comments:  No specialty comments available.  Imaging: Xr Hip Unilat W Or W/o Pelvis 1v Left  Result Date: 04/14/2019 X-rays demonstrate evidence of a left nondisplaced periprosthetic femur fracture proximal to the cable plate.  No compromise to the femoral stem.  No other acute findings.  Xr Lumbar Spine 2-3 Views  Result Date: 04/14/2019 X-rays demonstrate moderate degenerative disc disease with old compression fracture at L3.  Otherwise, no acute findings    PMFS History: Patient Active Problem List    Diagnosis Date Noted  . Spondylosis without myelopathy or radiculopathy, lumbar region 02/12/2019  . Spondylolisthesis of lumbar region 02/12/2019  . Pain of left hip joint 12/23/2018  . Primary osteoarthritis of left knee 12/23/2018  . Periprosthetic fracture around internal prosthetic left hip joint ([facility]) 04/19/2018  . Long term (current) use of anticoagulants [Z79.01] 08/14/2017  . Genetic testing 04/01/2016  . possible sleep apnea by history 02/12/2016  . A-fib ([facility]) 02/11/2016  . Surgical wound infection 02/11/2016  . Breast cancer of upper-outer quadrant of right female breast ([facility]) 01/11/2016  . Impaired glucose tolerance 07/28/2014  . Anxiety disorder, unspecified 04/07/2007  . Osteoarthritis 04/07/2007  . Essential hypertension 04/02/2007  . History of colonic polyps 04/02/2007  . DIVERTICULITIS, HX OF 04/02/2007   Past Medical History:  Diagnosis Date  . ANXIETY 04/07/2007  . Breast cancer of upper-outer quadrant of right female breast ([facility]) 01/11/2016  . COLONIC POLYPS, HX OF 04/02/2007   adenomatous  . DIVERTICULITIS, HX OF 04/02/2007  . Diverticulosis   . DJD (degenerative joint disease)   . Hemorrhoids   . HYPERTENSION 04/02/2007  . OSTEOARTHRITIS, SEVERE 04/07/2007  . 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 WEIGHT GAIN 11/13/2007    Family History  Problem Relation Age of Onset  . Pancreatic cancer Mother 48       d. 59  . Cancer Father 70       hodgkin's lymphoma and leukemia; d. 19  . Colon cancer Brother        dx. late 21s; smoker  . Pancreatic cancer Brother 80       d. 85; smoker  . Lung cancer Brother 40       smoker  . Heart attack Brother        d. late 2s  . Colon cancer Paternal Aunt   . Breast cancer Daughter 33       negative genetic testing in 2016  . Skin cancer Daughter        non-melanoma type; +sun exposure  . Kidney failure Son 62       +EtOH abuse resulting in liver, kidney, and pancreas failure  . Diabetes Maternal Uncle         d. later age  . Heart disease Neg Hx        family  . Esophageal cancer Neg Hx   . Rectal cancer Neg Hx   . Stomach cancer Neg Hx     Past Surgical History:  Procedure Laterality Date  . APPENDECTOMY    . BREAST LUMPECTOMY WITH RADIOACTIVE SEED LOCALIZATION Right 02/08/2016   Procedure: RIGHT BREAST LUMPECTOMY WITH RADIOACTIVE SEED LOCALIZATION;  Surgeon: Fanny Skates, MD;  Location: [facility];  Service: General;  Laterality: Right;  . ORIF FEMUR FRACTURE Left 04/21/2018   Procedure: OPEN REDUCTION INTERNAL FIXATION (ORIF) PERI PROSTHETIC FEMUR FRACTURE;  Surgeon: Leandrew Koyanagi, MD;  Location: [facility];  Service: Orthopedics;  Laterality: Left;  . TONSILLECTOMY    . TOTAL HIP ARTHROPLASTY  1999, left hip    2006 right hip  . TUBAL LIGATION     Social History   Occupational History  . Not on file  Tobacco Use  . Smoking status: Former Smoker    Packs/day: 1.00    Years: 27.00    Pack years: 27.00    Types: Cigarettes    Quit date: 06/04/1978    Years since quitting: 40.8  . Smokeless tobacco: Never Used  Substance and Sexual Activity  . Alcohol use: Yes    Alcohol/week: 7.0 standard drinks    Types: 7 Glasses of wine per week    Comment: 1-2 glasses wine after dinner, but not necessarily each day  . Drug use: No  . Sexual activity: Not on file

## 2019-04-16 ENCOUNTER — Telehealth: Payer: Self-pay | Admitting: Orthopaedic Surgery

## 2019-04-16 NOTE — Telephone Encounter (Signed)
See message.

## 2019-04-16 NOTE — Telephone Encounter (Signed)
Patient called requesting something stronger for pain.  CB#[phone].  Thank you.

## 2019-04-17 ENCOUNTER — Other Ambulatory Visit: Payer: Self-pay | Admitting: Physician Assistant

## 2019-04-17 MED ORDER — HYDROCODONE-ACETAMINOPHEN 5-325 MG PO TABS: 1.0000 | ORAL_TABLET | Freq: Three times a day (TID) | ORAL | 0 refills | Status: DC | PRN

## 2019-04-17 NOTE — Telephone Encounter (Signed)
Called patient. She does not care what is sent in.  Pharmacy is [facility] on [address].

## 2019-04-17 NOTE — Telephone Encounter (Signed)
I just sent in norco

## 2019-04-17 NOTE — Telephone Encounter (Signed)
I called patient and advised. 

## 2019-04-17 NOTE — Telephone Encounter (Signed)
Will you see what she is able to tolerate?

## 2019-04-17 NOTE — Telephone Encounter (Signed)
thanks

## 2019-04-28 ENCOUNTER — Ambulatory Visit
Admission: RE | Admit: 2019-04-28 | Discharge: 2019-04-28 | Disposition: A | Discharge status: Home / Self Care | Payer: Medicare Other | Source: Ambulatory Visit | Attending: Orthopaedic Surgery | Admitting: Orthopaedic Surgery

## 2019-04-28 DIAGNOSIS — M545 Low back pain, unspecified: Secondary | ICD-10-CM

## 2019-04-29 ENCOUNTER — Encounter: Payer: Self-pay | Admitting: Orthopaedic Surgery

## 2019-04-29 ENCOUNTER — Other Ambulatory Visit: Payer: Self-pay

## 2019-04-29 ENCOUNTER — Ambulatory Visit (INDEPENDENT_AMBULATORY_CARE_PROVIDER_SITE_OTHER): Payer: Medicare Other | Admitting: Orthopaedic Surgery

## 2019-04-29 ENCOUNTER — Ambulatory Visit (INDEPENDENT_AMBULATORY_CARE_PROVIDER_SITE_OTHER): Payer: Medicare Other

## 2019-04-29 VITALS — Wt 151.0 lb

## 2019-04-29 DIAGNOSIS — M545 Low back pain, unspecified: Secondary | ICD-10-CM

## 2019-04-29 DIAGNOSIS — M25552 Pain in left hip: Secondary | ICD-10-CM

## 2019-04-29 NOTE — Progress Notes (Signed)
Office Visit Note   Patient: Teresa Frey           Date of Birth: 1929-06-07           MRN: [id number] Visit Date: 04/29/2019              Requested by: [name], [name], MD [address] [place],  [place] [address] PCP: [name], [name], MD   Assessment & Plan: Visit Diagnoses:  1. Pain in left hip   2. Acute midline low back pain, unspecified whether sciatica present     Plan: X-rays demonstrate stable fracture.  At this point we will allow her to weight-bear as tolerated to the left lower extremity immobilized with home health PT.  In terms of her lumbar spine she has chronic T12 compression fracture and a subacute L1 compression fracture.  Symptomatic treatment as needed.  Recheck in 4 weeks with two-view x-rays of the left femur.  Follow-Up Instructions: Return in about 4 weeks (around 05/27/2019).   Orders:  Orders Placed This Encounter  Procedures  . XR FEMUR MIN 2 VIEWS LEFT   No orders of the defined types were placed in this encounter.     Procedures: No procedures performed   Clinical Data: No additional findings.   Subjective: Chief Complaint  Patient presents with  . Left Leg - Pain  . Lower Back - Pain    CT results    Patient returns today for repeat x-rays of her left periprosthetic femur fracture.  She reports no pain in her back or her hip and has not taken any pain medicines for 4 days.   Review of Systems   Objective: Vital Signs: Wt 151 lb (68.5 kg)   BMI 25.92 kg/m   Physical Exam  Ortho Exam Exam is improved in terms of pain. Specialty Comments:  No specialty comments available.  Imaging: Xr Femur Min 2 Views Left  Result Date: 04/29/2019 Stable periprosthetic fracture of the proximal femur    PMFS History: Patient Active Problem List   Diagnosis Date Noted  . Spondylosis without myelopathy or radiculopathy, lumbar region 02/12/2019  . Spondylolisthesis of lumbar region 02/12/2019  . Pain of left hip joint  12/23/2018  . Primary osteoarthritis of left knee 12/23/2018  . Periprosthetic fracture around internal prosthetic left hip joint ([facility]) 04/19/2018  . Long term (current) use of anticoagulants [Z79.01] 08/14/2017  . Genetic testing 04/01/2016  . possible sleep apnea by history 02/12/2016  . A-fib ([facility]) 02/11/2016  . Surgical wound infection 02/11/2016  . Breast cancer of upper-outer quadrant of right female breast ([facility]) 01/11/2016  . Impaired glucose tolerance 07/28/2014  . Anxiety disorder, unspecified 04/07/2007  . Osteoarthritis 04/07/2007  . Essential hypertension 04/02/2007  . History of colonic polyps 04/02/2007  . DIVERTICULITIS, HX OF 04/02/2007   Past Medical History:  Diagnosis Date  . ANXIETY 04/07/2007  . Breast cancer of upper-outer quadrant of right female breast ([facility]) 01/11/2016  . COLONIC POLYPS, HX OF 04/02/2007   adenomatous  . DIVERTICULITIS, HX OF 04/02/2007  . Diverticulosis   . DJD (degenerative joint disease)   . Hemorrhoids   . HYPERTENSION 04/02/2007  . OSTEOARTHRITIS, SEVERE 04/07/2007  . 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 WEIGHT GAIN 11/13/2007    Family History  Problem Relation Age of Onset  . Pancreatic cancer Mother 24       d. 21  . Cancer Father 14  hodgkin's lymphoma and leukemia; d. 23  . Colon cancer Brother        dx. late 60s; smoker  . Pancreatic cancer Brother 80       d. 37; smoker  . Lung cancer Brother 40       smoker  . Heart attack Brother        d. late 39s  . Colon cancer Paternal Aunt   . Breast cancer Daughter 57       negative genetic testing in 2016  . Skin cancer Daughter        non-melanoma type; +sun exposure  . Kidney failure Son 17       +EtOH abuse resulting in liver, kidney, and pancreas failure  . Diabetes Maternal Uncle        d. later age  . Heart disease Neg Hx        family  . Esophageal cancer Neg Hx   . Rectal cancer Neg Hx   . Stomach cancer Neg Hx     Past Surgical History:   Procedure Laterality Date  . APPENDECTOMY    . BREAST LUMPECTOMY WITH RADIOACTIVE SEED LOCALIZATION Right 02/08/2016   Procedure: RIGHT BREAST LUMPECTOMY WITH RADIOACTIVE SEED LOCALIZATION;  Surgeon: Fanny Skates, MD;  Location: [facility];  Service: General;  Laterality: Right;  . ORIF FEMUR FRACTURE Left 04/21/2018   Procedure: OPEN REDUCTION INTERNAL FIXATION (ORIF) PERI PROSTHETIC FEMUR FRACTURE;  Surgeon: Leandrew Koyanagi, MD;  Location: [facility];  Service: Orthopedics;  Laterality: Left;  . TONSILLECTOMY    . TOTAL HIP ARTHROPLASTY  1999, left hip    2006 right hip  . TUBAL LIGATION     Social History   Occupational History  . Not on file  Tobacco Use  . Smoking status: Former Smoker    Packs/day: 1.00    Years: 27.00    Pack years: 27.00    Types: Cigarettes    Quit date: 06/04/1978    Years since quitting: 40.9  . Smokeless tobacco: Never Used  Substance and Sexual Activity  . Alcohol use: Yes    Alcohol/week: 7.0 standard drinks    Types: 7 Glasses of wine per week    Comment: 1-2 glasses wine after dinner, but not necessarily each day  . Drug use: No  . Sexual activity: Not on file

## 2019-05-03 DIAGNOSIS — I10 Essential (primary) hypertension: Secondary | ICD-10-CM | POA: Diagnosis not present

## 2019-05-03 DIAGNOSIS — S7292XD Unspecified fracture of left femur, subsequent encounter for closed fracture with routine healing: Secondary | ICD-10-CM | POA: Diagnosis not present

## 2019-05-03 DIAGNOSIS — Z87891 Personal history of nicotine dependence: Secondary | ICD-10-CM | POA: Diagnosis not present

## 2019-05-03 DIAGNOSIS — F419 Anxiety disorder, unspecified: Secondary | ICD-10-CM | POA: Diagnosis not present

## 2019-05-03 DIAGNOSIS — Z9181 History of falling: Secondary | ICD-10-CM | POA: Diagnosis not present

## 2019-05-03 DIAGNOSIS — M1712 Unilateral primary osteoarthritis, left knee: Secondary | ICD-10-CM | POA: Diagnosis not present

## 2019-05-03 DIAGNOSIS — M4316 Spondylolisthesis, lumbar region: Secondary | ICD-10-CM | POA: Diagnosis not present

## 2019-05-03 DIAGNOSIS — Z853 Personal history of malignant neoplasm of breast: Secondary | ICD-10-CM | POA: Diagnosis not present

## 2019-05-03 DIAGNOSIS — Z86010 Personal history of colonic polyps: Secondary | ICD-10-CM | POA: Diagnosis not present

## 2019-05-03 DIAGNOSIS — M47816 Spondylosis without myelopathy or radiculopathy, lumbar region: Secondary | ICD-10-CM | POA: Diagnosis not present

## 2019-05-03 DIAGNOSIS — I4891 Unspecified atrial fibrillation: Secondary | ICD-10-CM | POA: Diagnosis not present

## 2019-05-04 ENCOUNTER — Telehealth: Payer: Self-pay | Admitting: Orthopaedic Surgery

## 2019-05-04 NOTE — Telephone Encounter (Signed)
[facility] request verbal auth for PT 2 times a week for 5 weeks. [facility] call back # [phone]

## 2019-05-04 NOTE — Telephone Encounter (Signed)
Called to approve orders 

## 2019-05-05 ENCOUNTER — Ambulatory Visit: Payer: Medicare Other | Admitting: Orthopaedic Surgery

## 2019-05-22 ENCOUNTER — Other Ambulatory Visit: Payer: Self-pay | Admitting: Family Medicine

## 2019-05-27 ENCOUNTER — Ambulatory Visit (INDEPENDENT_AMBULATORY_CARE_PROVIDER_SITE_OTHER): Payer: Medicare Other | Admitting: Pharmacist Clinician (PhC)/ Clinical Pharmacy Specialist

## 2019-05-27 ENCOUNTER — Ambulatory Visit: Payer: Self-pay

## 2019-05-27 ENCOUNTER — Ambulatory Visit (INDEPENDENT_AMBULATORY_CARE_PROVIDER_SITE_OTHER): Payer: Medicare Other | Admitting: Orthopaedic Surgery

## 2019-05-27 ENCOUNTER — Encounter: Payer: Self-pay | Admitting: Orthopaedic Surgery

## 2019-05-27 ENCOUNTER — Other Ambulatory Visit: Payer: Self-pay

## 2019-05-27 ENCOUNTER — Telehealth: Payer: Self-pay

## 2019-05-27 DIAGNOSIS — Z7901 Long term (current) use of anticoagulants: Secondary | ICD-10-CM

## 2019-05-27 DIAGNOSIS — I4891 Unspecified atrial fibrillation: Secondary | ICD-10-CM

## 2019-05-27 DIAGNOSIS — M9702XD Periprosthetic fracture around internal prosthetic left hip joint, subsequent encounter: Secondary | ICD-10-CM | POA: Diagnosis not present

## 2019-05-27 LAB — POCT INR: INR: 2.9 (ref 2.0–3.0)

## 2019-05-27 NOTE — Progress Notes (Signed)
Office Visit Note   Patient: Teresa Frey           Date of Birth: [birth year]           MRN: [id number] Visit Date: 05/27/2019              Requested by: [name], [name], MD [address] [place],  [place] [address] PCP: [name], [name], MD   Assessment & Plan: Visit Diagnoses:  1. Periprosthetic fracture around internal prosthetic left hip joint, subsequent encounter     Plan:At this point, we will extend her home health physical therapy for another month.  She will follow-up with [place] in 6 weeks time for repeat evaluation and 2 view x-rays of the left femur.  Call with concerns or questions.    Follow-Up Instructions: Return in about 6 weeks (around 07/08/2019).   Orders:  Orders Placed This Encounter  Procedures  . XR FEMUR MIN 2 VIEWS LEFT   No orders of the defined types were placed in this encounter.     Procedures: No procedures performed   Clinical Data: No additional findings.   Subjective: Chief Complaint  Patient presents with  . Left Hip - Pain  . Lower Back - Pain    HPI patient is a pleasant 85 year old female who presents our clinic today 7 weeks status post left hip periprosthetic femur fracture 04/11/2019 following ORIF left periprosthetic femur fracture 12/23/2018.  She has been doing well.  She is now ambulating with a walker.  She is getting home health physical therapy twice a week.  She does note soreness to the lateral hip but nothing more.  She is not taking anything for pain.        Objective: Vital Signs: There were no vitals taken for this visit.    Ortho Exam Examination of her left hip reveals moderate tenderness over the greater trochanter.  Negative logroll.  She is able to flex her hip.  She does lack a little strength with resisted hip flexion.  She is neurovascular intact distally.  Specialty Comments:  No specialty comments available.  Imaging: XR FEMUR MIN 2 VIEWS LEFT  Result Date: 05/27/2019 Healing left  periprosthetic femur fracture    PMFS History: Patient Active Problem List   Diagnosis Date Noted  . Spondylosis without myelopathy or radiculopathy, lumbar region 02/12/2019  . Spondylolisthesis of lumbar region 02/12/2019  . Pain of left hip joint 12/23/2018  . Primary osteoarthritis of left knee 12/23/2018  . Periprosthetic fracture around internal prosthetic left hip joint ([facility]) 04/19/2018  . Long term (current) use of anticoagulants [Z79.01] 08/14/2017  . Genetic testing 04/01/2016  . possible sleep apnea by history 02/12/2016  . A-fib ([facility]) 02/11/2016  . Surgical wound infection 02/11/2016  . Breast cancer of upper-outer quadrant of right female breast ([facility]) 01/11/2016  . Impaired glucose tolerance 07/28/2014  . Anxiety disorder, unspecified 04/07/2007  . Osteoarthritis 04/07/2007  . Essential hypertension 04/02/2007  . History of colonic polyps 04/02/2007  . DIVERTICULITIS, HX OF 04/02/2007   Past Medical History:  Diagnosis Date  . ANXIETY 04/07/2007  . Breast cancer of upper-outer quadrant of right female breast ([facility]) 01/11/2016  . COLONIC POLYPS, HX OF 04/02/2007   adenomatous  . DIVERTICULITIS, HX OF 04/02/2007  . Diverticulosis   . DJD (degenerative joint disease)   . Hemorrhoids   . HYPERTENSION 04/02/2007  . OSTEOARTHRITIS, SEVERE 04/07/2007  . 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 WEIGHT  GAIN 11/13/2007    Family History  Problem Relation Age of Onset  . Pancreatic cancer Mother 82       d. 74  . Cancer Father 35       hodgkin's lymphoma and leukemia; d. 28  . Colon cancer Brother        dx. late 57s; smoker  . Pancreatic cancer Brother 80       d. 4; smoker  . Lung cancer Brother 40       smoker  . Heart attack Brother        d. late 28s  . Colon cancer Paternal Aunt   . Breast cancer Daughter 71       negative genetic testing in 2016  . Skin cancer Daughter        non-melanoma type; +sun exposure  . Kidney failure Son 93       +EtOH  abuse resulting in liver, kidney, and pancreas failure  . Diabetes Maternal Uncle        d. later age  . Heart disease Neg Hx        family  . Esophageal cancer Neg Hx   . Rectal cancer Neg Hx   . Stomach cancer Neg Hx     Past Surgical History:  Procedure Laterality Date  . APPENDECTOMY    . BREAST LUMPECTOMY WITH RADIOACTIVE SEED LOCALIZATION Right 02/08/2016   Procedure: RIGHT BREAST LUMPECTOMY WITH RADIOACTIVE SEED LOCALIZATION;  Surgeon: Fanny Skates, MD;  Location: [facility];  Service: General;  Laterality: Right;  . ORIF FEMUR FRACTURE Left 04/21/2018   Procedure: OPEN REDUCTION INTERNAL FIXATION (ORIF) PERI PROSTHETIC FEMUR FRACTURE;  Surgeon: Leandrew Koyanagi, MD;  Location: [facility];  Service: Orthopedics;  Laterality: Left;  . TONSILLECTOMY    . TOTAL HIP ARTHROPLASTY  1999, left hip    2006 right hip  . TUBAL LIGATION     Social History   Occupational History  . Not on file  Tobacco Use  . Smoking status: Former Smoker    Packs/day: 1.00    Years: 27.00    Pack years: 27.00    Types: Cigarettes    Quit date: 06/04/1978    Years since quitting: 41.0  . Smokeless tobacco: Never Used  Substance and Sexual Activity  . Alcohol use: Yes    Alcohol/week: 7.0 standard drinks    Types: 7 Glasses of wine per week    Comment: 1-2 glasses wine after dinner, but not necessarily each day  . Drug use: No  . Sexual activity: Not on file

## 2019-05-27 NOTE — Telephone Encounter (Signed)
Faxed Order to extend  HHPT to [place].

## 2019-05-30 ENCOUNTER — Other Ambulatory Visit: Payer: Self-pay | Admitting: Cardiology

## 2019-06-02 DIAGNOSIS — F419 Anxiety disorder, unspecified: Secondary | ICD-10-CM | POA: Diagnosis not present

## 2019-06-02 DIAGNOSIS — I4891 Unspecified atrial fibrillation: Secondary | ICD-10-CM | POA: Diagnosis not present

## 2019-06-02 DIAGNOSIS — I10 Essential (primary) hypertension: Secondary | ICD-10-CM | POA: Diagnosis not present

## 2019-06-02 DIAGNOSIS — Z853 Personal history of malignant neoplasm of breast: Secondary | ICD-10-CM | POA: Diagnosis not present

## 2019-06-02 DIAGNOSIS — Z9181 History of falling: Secondary | ICD-10-CM | POA: Diagnosis not present

## 2019-06-02 DIAGNOSIS — S7292XD Unspecified fracture of left femur, subsequent encounter for closed fracture with routine healing: Secondary | ICD-10-CM | POA: Diagnosis not present

## 2019-06-02 DIAGNOSIS — Z86010 Personal history of colonic polyps: Secondary | ICD-10-CM | POA: Diagnosis not present

## 2019-06-02 DIAGNOSIS — M1712 Unilateral primary osteoarthritis, left knee: Secondary | ICD-10-CM | POA: Diagnosis not present

## 2019-06-02 DIAGNOSIS — M47816 Spondylosis without myelopathy or radiculopathy, lumbar region: Secondary | ICD-10-CM | POA: Diagnosis not present

## 2019-06-02 DIAGNOSIS — Z87891 Personal history of nicotine dependence: Secondary | ICD-10-CM | POA: Diagnosis not present

## 2019-06-02 DIAGNOSIS — M4316 Spondylolisthesis, lumbar region: Secondary | ICD-10-CM | POA: Diagnosis not present

## 2019-06-03 DIAGNOSIS — I4891 Unspecified atrial fibrillation: Secondary | ICD-10-CM | POA: Diagnosis not present

## 2019-06-03 DIAGNOSIS — M4316 Spondylolisthesis, lumbar region: Secondary | ICD-10-CM | POA: Diagnosis not present

## 2019-06-03 DIAGNOSIS — S7292XD Unspecified fracture of left femur, subsequent encounter for closed fracture with routine healing: Secondary | ICD-10-CM | POA: Diagnosis not present

## 2019-06-03 DIAGNOSIS — M1712 Unilateral primary osteoarthritis, left knee: Secondary | ICD-10-CM | POA: Diagnosis not present

## 2019-06-03 DIAGNOSIS — I10 Essential (primary) hypertension: Secondary | ICD-10-CM | POA: Diagnosis not present

## 2019-06-03 DIAGNOSIS — M47816 Spondylosis without myelopathy or radiculopathy, lumbar region: Secondary | ICD-10-CM | POA: Diagnosis not present

## 2019-06-09 DIAGNOSIS — I10 Essential (primary) hypertension: Secondary | ICD-10-CM | POA: Diagnosis not present

## 2019-06-09 DIAGNOSIS — I4891 Unspecified atrial fibrillation: Secondary | ICD-10-CM | POA: Diagnosis not present

## 2019-06-09 DIAGNOSIS — M4316 Spondylolisthesis, lumbar region: Secondary | ICD-10-CM | POA: Diagnosis not present

## 2019-06-09 DIAGNOSIS — M1712 Unilateral primary osteoarthritis, left knee: Secondary | ICD-10-CM | POA: Diagnosis not present

## 2019-06-09 DIAGNOSIS — S7292XD Unspecified fracture of left femur, subsequent encounter for closed fracture with routine healing: Secondary | ICD-10-CM | POA: Diagnosis not present

## 2019-06-09 DIAGNOSIS — M47816 Spondylosis without myelopathy or radiculopathy, lumbar region: Secondary | ICD-10-CM | POA: Diagnosis not present

## 2019-06-11 DIAGNOSIS — M47816 Spondylosis without myelopathy or radiculopathy, lumbar region: Secondary | ICD-10-CM | POA: Diagnosis not present

## 2019-06-11 DIAGNOSIS — S7292XD Unspecified fracture of left femur, subsequent encounter for closed fracture with routine healing: Secondary | ICD-10-CM | POA: Diagnosis not present

## 2019-06-11 DIAGNOSIS — M1712 Unilateral primary osteoarthritis, left knee: Secondary | ICD-10-CM | POA: Diagnosis not present

## 2019-06-11 DIAGNOSIS — I10 Essential (primary) hypertension: Secondary | ICD-10-CM | POA: Diagnosis not present

## 2019-06-11 DIAGNOSIS — M4316 Spondylolisthesis, lumbar region: Secondary | ICD-10-CM | POA: Diagnosis not present

## 2019-06-11 DIAGNOSIS — I4891 Unspecified atrial fibrillation: Secondary | ICD-10-CM | POA: Diagnosis not present

## 2019-06-16 DIAGNOSIS — M4316 Spondylolisthesis, lumbar region: Secondary | ICD-10-CM | POA: Diagnosis not present

## 2019-06-16 DIAGNOSIS — S7292XD Unspecified fracture of left femur, subsequent encounter for closed fracture with routine healing: Secondary | ICD-10-CM | POA: Diagnosis not present

## 2019-06-16 DIAGNOSIS — M1712 Unilateral primary osteoarthritis, left knee: Secondary | ICD-10-CM | POA: Diagnosis not present

## 2019-06-16 DIAGNOSIS — I4891 Unspecified atrial fibrillation: Secondary | ICD-10-CM | POA: Diagnosis not present

## 2019-06-16 DIAGNOSIS — M47816 Spondylosis without myelopathy or radiculopathy, lumbar region: Secondary | ICD-10-CM | POA: Diagnosis not present

## 2019-06-16 DIAGNOSIS — I10 Essential (primary) hypertension: Secondary | ICD-10-CM | POA: Diagnosis not present

## 2019-06-18 DIAGNOSIS — S7292XD Unspecified fracture of left femur, subsequent encounter for closed fracture with routine healing: Secondary | ICD-10-CM | POA: Diagnosis not present

## 2019-06-18 DIAGNOSIS — I4891 Unspecified atrial fibrillation: Secondary | ICD-10-CM | POA: Diagnosis not present

## 2019-06-18 DIAGNOSIS — M47816 Spondylosis without myelopathy or radiculopathy, lumbar region: Secondary | ICD-10-CM | POA: Diagnosis not present

## 2019-06-18 DIAGNOSIS — M1712 Unilateral primary osteoarthritis, left knee: Secondary | ICD-10-CM | POA: Diagnosis not present

## 2019-06-18 DIAGNOSIS — M4316 Spondylolisthesis, lumbar region: Secondary | ICD-10-CM | POA: Diagnosis not present

## 2019-06-18 DIAGNOSIS — I10 Essential (primary) hypertension: Secondary | ICD-10-CM | POA: Diagnosis not present

## 2019-06-19 ENCOUNTER — Other Ambulatory Visit: Payer: Self-pay | Admitting: Hematology

## 2019-06-19 DIAGNOSIS — C50411 Malignant neoplasm of upper-outer quadrant of right female breast: Secondary | ICD-10-CM

## 2019-06-19 DIAGNOSIS — Z170 Estrogen receptor positive status [ER+]: Secondary | ICD-10-CM

## 2019-06-23 DIAGNOSIS — S7292XD Unspecified fracture of left femur, subsequent encounter for closed fracture with routine healing: Secondary | ICD-10-CM | POA: Diagnosis not present

## 2019-06-23 DIAGNOSIS — M4316 Spondylolisthesis, lumbar region: Secondary | ICD-10-CM | POA: Diagnosis not present

## 2019-06-23 DIAGNOSIS — I4891 Unspecified atrial fibrillation: Secondary | ICD-10-CM | POA: Diagnosis not present

## 2019-06-23 DIAGNOSIS — I10 Essential (primary) hypertension: Secondary | ICD-10-CM | POA: Diagnosis not present

## 2019-06-23 DIAGNOSIS — M1712 Unilateral primary osteoarthritis, left knee: Secondary | ICD-10-CM | POA: Diagnosis not present

## 2019-06-23 DIAGNOSIS — M47816 Spondylosis without myelopathy or radiculopathy, lumbar region: Secondary | ICD-10-CM | POA: Diagnosis not present

## 2019-06-25 DIAGNOSIS — S7292XD Unspecified fracture of left femur, subsequent encounter for closed fracture with routine healing: Secondary | ICD-10-CM | POA: Diagnosis not present

## 2019-06-25 DIAGNOSIS — M4316 Spondylolisthesis, lumbar region: Secondary | ICD-10-CM | POA: Diagnosis not present

## 2019-06-25 DIAGNOSIS — M1712 Unilateral primary osteoarthritis, left knee: Secondary | ICD-10-CM | POA: Diagnosis not present

## 2019-06-25 DIAGNOSIS — I10 Essential (primary) hypertension: Secondary | ICD-10-CM | POA: Diagnosis not present

## 2019-06-25 DIAGNOSIS — M47816 Spondylosis without myelopathy or radiculopathy, lumbar region: Secondary | ICD-10-CM | POA: Diagnosis not present

## 2019-06-25 DIAGNOSIS — I4891 Unspecified atrial fibrillation: Secondary | ICD-10-CM | POA: Diagnosis not present

## 2019-06-29 ENCOUNTER — Telehealth: Payer: Self-pay | Admitting: Orthopaedic Surgery

## 2019-06-29 DIAGNOSIS — I4891 Unspecified atrial fibrillation: Secondary | ICD-10-CM | POA: Diagnosis not present

## 2019-06-29 DIAGNOSIS — M47816 Spondylosis without myelopathy or radiculopathy, lumbar region: Secondary | ICD-10-CM | POA: Diagnosis not present

## 2019-06-29 DIAGNOSIS — M4316 Spondylolisthesis, lumbar region: Secondary | ICD-10-CM | POA: Diagnosis not present

## 2019-06-29 DIAGNOSIS — I10 Essential (primary) hypertension: Secondary | ICD-10-CM | POA: Diagnosis not present

## 2019-06-29 DIAGNOSIS — S7292XD Unspecified fracture of left femur, subsequent encounter for closed fracture with routine healing: Secondary | ICD-10-CM | POA: Diagnosis not present

## 2019-06-29 DIAGNOSIS — M1712 Unilateral primary osteoarthritis, left knee: Secondary | ICD-10-CM | POA: Diagnosis not present

## 2019-06-29 NOTE — Telephone Encounter (Signed)
Called Anda Kraft no answer [facility] approving orders.

## 2019-06-29 NOTE — Telephone Encounter (Signed)
Anda Kraft from [place] at [facility] called.  She is requesting verbal orders for home health PT 1wk8   Call back number: [phone]

## 2019-07-02 DIAGNOSIS — M4316 Spondylolisthesis, lumbar region: Secondary | ICD-10-CM | POA: Diagnosis not present

## 2019-07-02 DIAGNOSIS — Z853 Personal history of malignant neoplasm of breast: Secondary | ICD-10-CM | POA: Diagnosis not present

## 2019-07-02 DIAGNOSIS — M47816 Spondylosis without myelopathy or radiculopathy, lumbar region: Secondary | ICD-10-CM | POA: Diagnosis not present

## 2019-07-02 DIAGNOSIS — I4891 Unspecified atrial fibrillation: Secondary | ICD-10-CM | POA: Diagnosis not present

## 2019-07-02 DIAGNOSIS — Z86010 Personal history of colonic polyps: Secondary | ICD-10-CM | POA: Diagnosis not present

## 2019-07-02 DIAGNOSIS — I10 Essential (primary) hypertension: Secondary | ICD-10-CM | POA: Diagnosis not present

## 2019-07-02 DIAGNOSIS — Z9181 History of falling: Secondary | ICD-10-CM | POA: Diagnosis not present

## 2019-07-02 DIAGNOSIS — S7292XD Unspecified fracture of left femur, subsequent encounter for closed fracture with routine healing: Secondary | ICD-10-CM | POA: Diagnosis not present

## 2019-07-02 DIAGNOSIS — Z87891 Personal history of nicotine dependence: Secondary | ICD-10-CM | POA: Diagnosis not present

## 2019-07-02 DIAGNOSIS — M1712 Unilateral primary osteoarthritis, left knee: Secondary | ICD-10-CM | POA: Diagnosis not present

## 2019-07-02 DIAGNOSIS — F419 Anxiety disorder, unspecified: Secondary | ICD-10-CM | POA: Diagnosis not present

## 2019-07-07 DIAGNOSIS — M47816 Spondylosis without myelopathy or radiculopathy, lumbar region: Secondary | ICD-10-CM | POA: Diagnosis not present

## 2019-07-07 DIAGNOSIS — S7292XD Unspecified fracture of left femur, subsequent encounter for closed fracture with routine healing: Secondary | ICD-10-CM | POA: Diagnosis not present

## 2019-07-07 DIAGNOSIS — I10 Essential (primary) hypertension: Secondary | ICD-10-CM | POA: Diagnosis not present

## 2019-07-07 DIAGNOSIS — I4891 Unspecified atrial fibrillation: Secondary | ICD-10-CM | POA: Diagnosis not present

## 2019-07-07 DIAGNOSIS — M1712 Unilateral primary osteoarthritis, left knee: Secondary | ICD-10-CM | POA: Diagnosis not present

## 2019-07-07 DIAGNOSIS — M4316 Spondylolisthesis, lumbar region: Secondary | ICD-10-CM | POA: Diagnosis not present

## 2019-07-08 ENCOUNTER — Other Ambulatory Visit: Payer: Self-pay

## 2019-07-08 ENCOUNTER — Ambulatory Visit (INDEPENDENT_AMBULATORY_CARE_PROVIDER_SITE_OTHER): Payer: Medicare Other | Admitting: Pharmacist Clinician (PhC)/ Clinical Pharmacy Specialist

## 2019-07-08 DIAGNOSIS — Z7901 Long term (current) use of anticoagulants: Secondary | ICD-10-CM | POA: Diagnosis not present

## 2019-07-08 DIAGNOSIS — I4891 Unspecified atrial fibrillation: Secondary | ICD-10-CM | POA: Diagnosis not present

## 2019-07-08 LAB — POCT INR: INR: 2.2 (ref 2.0–3.0)

## 2019-07-13 NOTE — Progress Notes (Signed)
[facility]   [phone] [phone]   Clinic Follow up Note   Patient Care Team: [name], [name], MD as PCP - General (Family Medicine) Fanny Skates, MD as Consulting Physician (General Surgery) Truitt Merle, MD as Consulting Physician (Hematology) Gery Pray, MD as Consulting Physician (Radiation Oncology) Gardenia Phlegm, NP as Nurse Practitioner (Hematology and Oncology)  I connected with Teresa Frey on 07/17/2019 at 10:20 AM EST by phone telemedicine visit and verified that I am speaking with the correct person using two identifiers.   I discussed the limitations, risks, security and privacy concerns of performing an evaluation and management service by telephone and the availability of in person appointments. I also discussed with the patient that there may be a patient responsible charge related to this service. The patient expressed understanding and agreed to proceed.   Patient's location: Home Provider's location:  My Office   Date of Service:  07/17/2019  CHIEF COMPLAINT: Follow up right breast cancer  SUMMARY OF ONCOLOGIC HISTORY: Oncology History Overview Note  Breast cancer of upper-outer quadrant of right female breast [facility])   Staging form: Breast, AJCC 7th Edition   - Clinical stage from 01/18/2016: Stage IA (T1b, N0, M0) - Unsigned   - Pathologic stage from 02/08/2016: Stage IA (T1c, N0, cM0) - Signed by Truitt Merle, MD on 02/27/2016    Breast cancer of upper-outer quadrant of right female breast ([facility])  01/05/2016 Mammogram   Diagnostic mammogram and ultrasound showed a 9 mm mass in the upper outer quadrant of right breast, axillary nodes were negative.   01/09/2016 Initial Diagnosis   Breast cancer of upper-outer quadrant of right female breast ([facility])   01/09/2016 Initial Biopsy   R breast 9:00 position biopsy showed invasive ductal carcinoma, G1-2   01/09/2016 Receptors her2   ER 90%+, PR 40%+, HER2-, Ki67 5%   02/08/2016  Surgery   Right breast lumpectomy, no SLN biopsy     02/08/2016 Pathology Results   Right breast lumpectomy showed invasive and in situ ductal carcinoma, 1.1 cm, margins were negative. Grade 1, no lymphovascular invasion. Atypical lobular hyperplasia.   02/21/2016 Genetic Testing   Genetic testing did not reveal a known deleterious mutation.  Genetic testing did identify a variant of uncertain significance (VUS) called "c.1659G>A (p.Met553Ile)" in one copy of the NBN gene.  Genes tested include: APC, ATM, AXIN2, BARD1, BMPR1A, BRCA1, BRCA2, BRIP1, CDH1, CDK4, CDKN2A, CHEK2, EPCAM, FANCC, MLH1, MSH2, MSH6, MUTYH, NBN, PALB2, PMS2, POLD1, POLE, PTEN, RAD51C, RAD51D, SCG5/GREM1, SMAD4, STK11, TP53, VHL, and XRCC2.   02/28/2016 -  Anti-estrogen oral therapy   Anastrozole 1 mg daily      CURRENT THERAPY:  anastrozole 2m daily started on 02/28/2016  INTERVAL HISTORY:  Teresa Frey is here for a follow up of right breast cancer. She was last seen by me 6 months ago.  I called her and verified her identity.  Since I saw her last time, she had multiple falls, which unfortunately caused lumbar compression fracture in 04/2019 and left hip fracture in early 2021. She was seen by her orthopedic surgeon Dr. XErlinda Hong and has recovered well.  She uses a cane at home.  Her pain has resolved.  She is able to ambulate, but less active.  She continues to tolerate anastrozole without any issues.  Review of systems otherwise negative.   MEDICAL HISTORY:  Past Medical History:  Diagnosis Date  . ANXIETY 04/07/2007  . Breast cancer of upper-outer quadrant of right  female breast ([facility]) 01/11/2016  . COLONIC POLYPS, HX OF 04/02/2007   adenomatous  . DIVERTICULITIS, HX OF 04/02/2007  . Diverticulosis   . DJD (degenerative joint disease)   . Hemorrhoids   . HYPERTENSION 04/02/2007  . OSTEOARTHRITIS, SEVERE 04/07/2007  . 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 WEIGHT GAIN 11/13/2007    SURGICAL HISTORY: Past  Surgical History:  Procedure Laterality Date  . APPENDECTOMY    . BREAST LUMPECTOMY WITH RADIOACTIVE SEED LOCALIZATION Right 02/08/2016   Procedure: RIGHT BREAST LUMPECTOMY WITH RADIOACTIVE SEED LOCALIZATION;  Surgeon: Fanny Skates, MD;  Location: [facility];  Service: General;  Laterality: Right;  . ORIF FEMUR FRACTURE Left 04/21/2018   Procedure: OPEN REDUCTION INTERNAL FIXATION (ORIF) PERI PROSTHETIC FEMUR FRACTURE;  Surgeon: Leandrew Koyanagi, MD;  Location: [facility];  Service: Orthopedics;  Laterality: Left;  . TONSILLECTOMY    . TOTAL HIP ARTHROPLASTY  1999, left hip    2006 right hip  . TUBAL LIGATION      I have reviewed the social history and family history with the patient and they are unchanged from previous note.  ALLERGIES:  is allergic to hydrocodone-acetaminophen and tramadol.  MEDICATIONS:  Current Outpatient Medications  Medication Sig Dispense Refill  . acetaminophen (TYLENOL) 325 MG tablet Take 1-2 tablets (325-650 mg total) by mouth every 6 (six) hours as needed for mild pain (pain score 1-3 or temp > 100.5).    Marland Kitchen acetaminophen-codeine (TYLENOL #3) 300-30 MG tablet Take 1 tablet by mouth every 8 (eight) hours as needed for moderate pain. 30 tablet 1  . albuterol (VENTOLIN HFA) 108 (90 Base) MCG/ACT inhaler INHALE 2 PUFFS INTO THE LUNGS EVERY 6 (SIX) HOURS AS NEEDED FOR WHEEZING OR SHORTNESS OF BREATH. 18 g 0  . anastrozole (ARIMIDEX) 1 MG tablet TAKE 1 TABLET BY MOUTH DAILY 90 tablet 3  . Ascorbic Acid (VITAMIN C) 1000 MG tablet Take 1,000 mg by mouth daily.    Marland Kitchen BIOTIN 5000 PO Take 1 tablet by mouth daily.    . Calcium Carb-Cholecalciferol (CALCIUM 1000 + D PO) Take 1 tablet by mouth daily.    . calcium-vitamin D (OSCAL WITH D) 500-200 MG-UNIT tablet Take 1 tablet by mouth 3 (three) times daily. 90 tablet 12  . docusate sodium (COLACE) 100 MG capsule Take 1 capsule (100 mg total) by mouth 2 (two) times daily. 10 capsule 0  . hydrochlorothiazide (MICROZIDE)  12.5 MG capsule TAKE 1 CAPSULE BY MOUTH DAILY. 30 capsule 3  . HYDROcodone-acetaminophen (NORCO) 5-325 MG tablet Take 1-2 tablets by mouth 3 (three) times daily as needed for moderate pain. 30 tablet 0  . metoprolol tartrate (LOPRESSOR) 25 MG tablet TAKE 1 TABLET BY MOUTH DAILY. 90 tablet 3  . Multiple Vitamins-Minerals (CENTRUM SILVER ADULT 50+) TABS Take 1 tablet by mouth daily.    Marland Kitchen omega-3 acid ethyl esters (LOVAZA) 1 g capsule Take 1 g by mouth daily.     Marland Kitchen oxyCODONE-acetaminophen (PERCOCET) 5-325 MG tablet Take 1-2 tablets by mouth every 4 (four) hours as needed for severe pain. 30 tablet 0  . polyethylene glycol (MIRALAX / GLYCOLAX) packet Take 17 g by mouth daily as needed for mild constipation. 14 each 0  . sertraline (ZOLOFT) 50 MG tablet TAKE 1 TABLET BY MOUTH DAILY. 90 tablet 1  . vitamin B-12 (CYANOCOBALAMIN) 1000 MCG tablet Take 1,000 mcg by mouth daily.    Marland Kitchen warfarin (COUMADIN) 5 MG tablet TAKE UP TO 2 TABLETS  DAILY OR AS DIRECTED. 90 tablet 1  . zinc sulfate 220 (50 Zn) MG capsule Take 1 capsule (220 mg total) by mouth daily. 42 capsule 0   No current facility-administered medications for this visit.    PHYSICAL EXAMINATION: ECOG PERFORMANCE STATUS: 2 - Symptomatic, <50% confined to bed  Exam not performed   LABORATORY DATA:  I have reviewed the data as listed CBC Latest Ref Rng & Units 01/16/2019 06/17/2018 04/24/2018  WBC 4.0 - 10.5 K/uL 9.5 7.3 14.1(H)  Hemoglobin 12.0 - 15.0 g/dL 13.5 13.2 8.6(L)  Hematocrit 36.0 - 46.0 % 43.1 44.6 27.1(L)  Platelets 150 - 400 K/uL 224 232 209     CMP Latest Ref Rng & Units 01/16/2019 06/17/2018 04/23/2018  Glucose 70 - 99 mg/dL 94 83 148(H)  BUN 8 - 23 mg/dL 22 14 13   Creatinine 0.44 - 1.00 mg/dL 0.83 0.77 0.72  Sodium 135 - 145 mmol/L 143 141 135  Potassium 3.5 - 5.1 mmol/L 4.6 3.9 3.7  Chloride 98 - 111 mmol/L 106 103 99  CO2 22 - 32 mmol/L 29 30 29   Calcium 8.9 - 10.3 mg/dL 10.1 8.9 8.5(L)  Total Protein 6.5 - 8.1 g/dL 6.7 -  -  Total Bilirubin 0.3 - 1.2 mg/dL 0.3 - -  Alkaline Phos 38 - 126 U/L 202(H) - -  AST 15 - 41 U/L 17 - -  ALT 0 - 44 U/L 14 - -      RADIOGRAPHIC STUDIES: I have personally reviewed the radiological images as listed and agreed with the findings in the report. No results found.   ASSESSMENT & PLAN:  Teresa Frey is a 86 y.o. female with    1. Breast cancer of upper-outer quadrant of right breast, invasive ductal carcinoma, pT1CN0M0, stage IA, ER+/PR+/HER2- -She was diagnosed in 01/2016. She is s/p right breastlumpectomy.  -Giving the low Ki-67 and agree 1 disease, I think this is likely a low risk cancer. I do not recommend adjuvant chemotherapy or Oncotype. -She started antiestrogen with Anastrozole in 02/2016. Tolerating moderately well witharthralgia. Willcontinue for 5 years total. -she has had multiple falls and fractures in the past few years, I discussed the option of biphosphonate.  -will continue anastrozole for now, plan to stop in 12/2020, or sooner if needed  -She is clinically doing well, no concern for recurrence -Follow-up in 4 months  2. GeneticTesting was negative  3. HTN, AFib  -She was on hydrochlorothiazide for hypertension before, which was held when she had a 2 fibrillation. She is on low-dose metoprolol. -Given her Afib episode on 06/28/17, she was placed on Lopressor 12.60m BID and Eliquis 5 mg BID  4. Arthralgia, Joint Stiffness -Chronic left hip pain. Has lower back pain. Follows with orthopedics. She ambulates with cane.  -Joint pain has slightly increased with anastrozole with hands stiffness. Manageable.  -Taking meloxicam. Stable.   6. Bone health, 2019 left femur fracture, multiple fractures in 04/2019  -I previously spoke with the patient about the risk for decreased strength in bones with anastrozole -Hadfall in 04/2018 that resulted in left femur fracture. She was treated with surgery. She recovered well.   -01/22/17 DEXA was normal..    -Continue Calcium and Vit D.  -She had a fall in 04/2019 with stage T12-L1 compression fracture  -I recommend patient to start oral biphosphonate due to her multiple fractures.  Potential benefit and side effects discussed with her, she does have a loose tooth, I recommend her to see her dentist  in the next few months and to get a clearance for oral biphosphonate -I will see her back in 4 months, probably will start her on Fosamax at next visit    Plan -Clinically doing well -Continue anastrozole -Lab and f/u in4 months, may start her on Fosamax on next visit     No problem-specific Assessment & Plan notes found for this encounter.   Orders Placed This Encounter  Procedures  . Vitamin D 25 hydroxy    Standing Status:   Future    Standing Expiration Date:   07/16/2020   All questions were answered. The patient knows to call the clinic with any problems, questions or concerns. No barriers to learning was detected. The total time spent in the appointment was 20 minutes.     Truitt Merle, MD 07/17/2019   I, Joslyn Devon, am acting as scribe for Truitt Merle, MD.   I have reviewed the above documentation for accuracy and completeness, and I agree with the above.

## 2019-07-14 ENCOUNTER — Telehealth: Payer: Self-pay | Admitting: Hematology

## 2019-07-14 ENCOUNTER — Ambulatory Visit (INDEPENDENT_AMBULATORY_CARE_PROVIDER_SITE_OTHER): Payer: Medicare Other

## 2019-07-14 ENCOUNTER — Other Ambulatory Visit: Payer: Self-pay

## 2019-07-14 ENCOUNTER — Encounter: Payer: Self-pay | Admitting: Orthopaedic Surgery

## 2019-07-14 ENCOUNTER — Ambulatory Visit (INDEPENDENT_AMBULATORY_CARE_PROVIDER_SITE_OTHER): Payer: Medicare Other | Admitting: Orthopaedic Surgery

## 2019-07-14 VITALS — Ht 61.0 in | Wt 151.0 lb

## 2019-07-14 DIAGNOSIS — M9702XD Periprosthetic fracture around internal prosthetic left hip joint, subsequent encounter: Secondary | ICD-10-CM

## 2019-07-14 NOTE — Progress Notes (Signed)
Office Visit Note   Patient: Teresa Frey           Date of Birth: 1929-06-27           MRN: [id number] Visit Date: 07/14/2019              Requested by: [name], [name], MD [address] [place],  [place] [address] PCP: [name], [name], MD   Assessment & Plan: Visit Diagnoses:  1. Periprosthetic fracture around internal prosthetic left hip joint, subsequent encounter     Plan: At this point she has demonstrate healing of the fracture.  She can increase activity as tolerated.  Questions encouraged and answered.  Follow-up as needed.  Follow-Up Instructions: Return if symptoms worsen or fail to improve.   Orders:  Orders Placed This Encounter  Procedures  . XR FEMUR MIN 2 VIEWS LEFT   No orders of the defined types were placed in this encounter.     Procedures: No procedures performed   Clinical Data: No additional findings.   Subjective: Chief Complaint  Patient presents with  . Left Leg - Follow-up    Patient is following up today for her left greater trochanter periprosthetic fracture.  She is doing much better and ambulating with a single-point cane.  She done physical therapy once a week.  She is not requiring any pain medications.   Review of Systems   Objective: Vital Signs: Ht 5\' 1"  (1.549 m)   Wt 151 lb (68.5 kg)   BMI 28.53 kg/m   Physical Exam  Ortho Exam Left hip exam shows painless range of motion.  She is mildly tender over the greater trochanter.  Ambulates with a single-point cane. Specialty Comments:  No specialty comments available.  Imaging: No results found.   PMFS History: Patient Active Problem List   Diagnosis Date Noted  . Spondylosis without myelopathy or radiculopathy, lumbar region 02/12/2019  . Spondylolisthesis of lumbar region 02/12/2019  . Pain of left hip joint 12/23/2018  . Primary osteoarthritis of left knee 12/23/2018  . Periprosthetic fracture around internal prosthetic left hip joint ([place]) 04/19/2018   . Long term (current) use of anticoagulants [Z79.01] 08/14/2017  . Genetic testing 04/01/2016  . possible sleep apnea by history 02/12/2016  . A-fib ([place]) 02/11/2016  . Surgical wound infection 02/11/2016  . Breast cancer of upper-outer quadrant of right female breast ([place]) 01/11/2016  . Impaired glucose tolerance 07/28/2014  . Anxiety disorder, unspecified 04/07/2007  . Osteoarthritis 04/07/2007  . Essential hypertension 04/02/2007  . History of colonic polyps 04/02/2007  . DIVERTICULITIS, HX OF 04/02/2007   Past Medical History:  Diagnosis Date  . ANXIETY 04/07/2007  . Breast cancer of upper-outer quadrant of right female breast ([place]) 01/11/2016  . COLONIC POLYPS, HX OF 04/02/2007   adenomatous  . DIVERTICULITIS, HX OF 04/02/2007  . Diverticulosis   . DJD (degenerative joint disease)   . Hemorrhoids   . HYPERTENSION 04/02/2007  . OSTEOARTHRITIS, SEVERE 04/07/2007  . 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 WEIGHT GAIN 11/13/2007    Family History  Problem Relation Age of Onset  . Pancreatic cancer Mother 62       d. 48  . Cancer Father 25       hodgkin's lymphoma and leukemia; d. 62  . Colon cancer Brother        dx. late 42s; smoker  . Pancreatic cancer Brother 80       d. 26; smoker  .  Lung cancer Brother 40       smoker  . Heart attack Brother        d. late 56s  . Colon cancer Paternal Aunt   . Breast cancer Daughter 25       negative genetic testing in 2016  . Skin cancer Daughter        non-melanoma type; +sun exposure  . Kidney failure Son 13       +EtOH abuse resulting in liver, kidney, and pancreas failure  . Diabetes Maternal Uncle        d. later age  . Heart disease Neg Hx        family  . Esophageal cancer Neg Hx   . Rectal cancer Neg Hx   . Stomach cancer Neg Hx     Past Surgical History:  Procedure Laterality Date  . APPENDECTOMY    . BREAST LUMPECTOMY WITH RADIOACTIVE SEED LOCALIZATION Right 02/08/2016   Procedure: RIGHT BREAST  LUMPECTOMY WITH RADIOACTIVE SEED LOCALIZATION;  Surgeon: Fanny Skates, MD;  Location: [facility];  Service: General;  Laterality: Right;  . ORIF FEMUR FRACTURE Left 04/21/2018   Procedure: OPEN REDUCTION INTERNAL FIXATION (ORIF) PERI PROSTHETIC FEMUR FRACTURE;  Surgeon: Leandrew Koyanagi, MD;  Location: [facility];  Service: Orthopedics;  Laterality: Left;  . TONSILLECTOMY    . TOTAL HIP ARTHROPLASTY  1999, left hip    2006 right hip  . TUBAL LIGATION     Social History   Occupational History  . Not on file  Tobacco Use  . Smoking status: Former Smoker    Packs/day: 1.00    Years: 27.00    Pack years: 27.00    Types: Cigarettes    Quit date: 06/04/1978    Years since quitting: 41.1  . Smokeless tobacco: Never Used  Substance and Sexual Activity  . Alcohol use: Yes    Alcohol/week: 7.0 standard drinks    Types: 7 Glasses of wine per week    Comment: 1-2 glasses wine after dinner, but not necessarily each day  . Drug use: No  . Sexual activity: Not on file

## 2019-07-14 NOTE — Telephone Encounter (Signed)
I talk with patient regarding lab and phone visit

## 2019-07-15 ENCOUNTER — Other Ambulatory Visit: Payer: Self-pay | Admitting: Family Medicine

## 2019-07-15 DIAGNOSIS — S7292XD Unspecified fracture of left femur, subsequent encounter for closed fracture with routine healing: Secondary | ICD-10-CM | POA: Diagnosis not present

## 2019-07-15 DIAGNOSIS — M4316 Spondylolisthesis, lumbar region: Secondary | ICD-10-CM | POA: Diagnosis not present

## 2019-07-15 DIAGNOSIS — I10 Essential (primary) hypertension: Secondary | ICD-10-CM | POA: Diagnosis not present

## 2019-07-15 DIAGNOSIS — M1712 Unilateral primary osteoarthritis, left knee: Secondary | ICD-10-CM | POA: Diagnosis not present

## 2019-07-15 DIAGNOSIS — M47816 Spondylosis without myelopathy or radiculopathy, lumbar region: Secondary | ICD-10-CM | POA: Diagnosis not present

## 2019-07-15 DIAGNOSIS — I4891 Unspecified atrial fibrillation: Secondary | ICD-10-CM | POA: Diagnosis not present

## 2019-07-17 ENCOUNTER — Other Ambulatory Visit: Payer: Medicare Other

## 2019-07-17 ENCOUNTER — Encounter: Payer: Self-pay | Admitting: Hematology

## 2019-07-17 ENCOUNTER — Inpatient Hospital Stay: Payer: Medicare Other | Attending: Hematology | Admitting: Hematology

## 2019-07-17 DIAGNOSIS — E2839 Other primary ovarian failure: Secondary | ICD-10-CM

## 2019-07-17 DIAGNOSIS — E559 Vitamin D deficiency, unspecified: Secondary | ICD-10-CM | POA: Diagnosis not present

## 2019-07-17 DIAGNOSIS — Z1321 Encounter for screening for nutritional disorder: Secondary | ICD-10-CM | POA: Diagnosis not present

## 2019-07-20 ENCOUNTER — Telehealth: Payer: Self-pay | Admitting: Hematology

## 2019-07-20 NOTE — Telephone Encounter (Signed)
Scheduled appt per 2/12 los.  Sent a message to HIM pool to get a calendar mailed out. 

## 2019-07-21 DIAGNOSIS — I10 Essential (primary) hypertension: Secondary | ICD-10-CM | POA: Diagnosis not present

## 2019-07-21 DIAGNOSIS — M4316 Spondylolisthesis, lumbar region: Secondary | ICD-10-CM | POA: Diagnosis not present

## 2019-07-21 DIAGNOSIS — M1712 Unilateral primary osteoarthritis, left knee: Secondary | ICD-10-CM | POA: Diagnosis not present

## 2019-07-21 DIAGNOSIS — I4891 Unspecified atrial fibrillation: Secondary | ICD-10-CM | POA: Diagnosis not present

## 2019-07-21 DIAGNOSIS — S7292XD Unspecified fracture of left femur, subsequent encounter for closed fracture with routine healing: Secondary | ICD-10-CM | POA: Diagnosis not present

## 2019-07-21 DIAGNOSIS — M47816 Spondylosis without myelopathy or radiculopathy, lumbar region: Secondary | ICD-10-CM | POA: Diagnosis not present

## 2019-07-27 DIAGNOSIS — M1712 Unilateral primary osteoarthritis, left knee: Secondary | ICD-10-CM | POA: Diagnosis not present

## 2019-07-27 DIAGNOSIS — I10 Essential (primary) hypertension: Secondary | ICD-10-CM | POA: Diagnosis not present

## 2019-07-27 DIAGNOSIS — I4891 Unspecified atrial fibrillation: Secondary | ICD-10-CM | POA: Diagnosis not present

## 2019-07-27 DIAGNOSIS — M4316 Spondylolisthesis, lumbar region: Secondary | ICD-10-CM | POA: Diagnosis not present

## 2019-07-27 DIAGNOSIS — S7292XD Unspecified fracture of left femur, subsequent encounter for closed fracture with routine healing: Secondary | ICD-10-CM | POA: Diagnosis not present

## 2019-07-27 DIAGNOSIS — M47816 Spondylosis without myelopathy or radiculopathy, lumbar region: Secondary | ICD-10-CM | POA: Diagnosis not present

## 2019-07-30 DIAGNOSIS — L570 Actinic keratosis: Secondary | ICD-10-CM | POA: Diagnosis not present

## 2019-07-30 DIAGNOSIS — L820 Inflamed seborrheic keratosis: Secondary | ICD-10-CM | POA: Diagnosis not present

## 2019-07-30 DIAGNOSIS — L821 Other seborrheic keratosis: Secondary | ICD-10-CM | POA: Diagnosis not present

## 2019-07-30 DIAGNOSIS — Z23 Encounter for immunization: Secondary | ICD-10-CM | POA: Diagnosis not present

## 2019-07-30 DIAGNOSIS — L578 Other skin changes due to chronic exposure to nonionizing radiation: Secondary | ICD-10-CM | POA: Diagnosis not present

## 2019-08-01 DIAGNOSIS — S7292XD Unspecified fracture of left femur, subsequent encounter for closed fracture with routine healing: Secondary | ICD-10-CM | POA: Diagnosis not present

## 2019-08-13 ENCOUNTER — Other Ambulatory Visit: Payer: Self-pay | Admitting: Cardiology

## 2019-08-19 ENCOUNTER — Other Ambulatory Visit: Payer: Self-pay

## 2019-08-19 ENCOUNTER — Ambulatory Visit (INDEPENDENT_AMBULATORY_CARE_PROVIDER_SITE_OTHER): Payer: Medicare Other | Admitting: Pharmacist Clinician (PhC)/ Clinical Pharmacy Specialist

## 2019-08-19 DIAGNOSIS — I4891 Unspecified atrial fibrillation: Secondary | ICD-10-CM | POA: Diagnosis not present

## 2019-08-19 DIAGNOSIS — Z7901 Long term (current) use of anticoagulants: Secondary | ICD-10-CM | POA: Diagnosis not present

## 2019-08-19 LAB — POCT INR: INR: 2.3 (ref 2.0–3.0)

## 2019-08-27 ENCOUNTER — Other Ambulatory Visit: Payer: Self-pay | Admitting: Cardiology

## 2019-09-01 DIAGNOSIS — H0014 Chalazion left upper eyelid: Secondary | ICD-10-CM | POA: Diagnosis not present

## 2019-09-01 DIAGNOSIS — H0015 Chalazion left lower eyelid: Secondary | ICD-10-CM | POA: Diagnosis not present

## 2019-09-08 DIAGNOSIS — H0014 Chalazion left upper eyelid: Secondary | ICD-10-CM | POA: Diagnosis not present

## 2019-09-08 DIAGNOSIS — Z23 Encounter for immunization: Secondary | ICD-10-CM | POA: Diagnosis not present

## 2019-09-09 DIAGNOSIS — Z7189 Other specified counseling: Secondary | ICD-10-CM | POA: Insufficient documentation

## 2019-09-09 NOTE — Progress Notes (Signed)
Cardiology Office Note   Date:  09/10/2019   ID:  Teresa Frey, DOB 11/24/1929, MRN [id number]  PCP:  [name], [name], MD  Cardiologist:   [name], MD   No chief complaint on file.     History of Present Illness: Teresa Frey is a 86 y.o. female who presents for follow up of atrial fib.  Since I saw her she had a couple falls.  However, he is not had any complications despite that.  She is not passed out.  She does not usually fall and she is usually very careful.  She still works a couple days per week.  She walks with a cane now because of the hip fracture.  She did not have to have surgery for this.  She does not feel any symptomatic fibrillation and she would know if she is in that she thinks.  Tolerates anticoagulation has had no evidence of bleeding.  She denies any palpitations, presyncope or syncope.  She had no chest discomfort, neck or arm discomfort.  She denies any shortness of breath, PND or orthopnea.   Past Medical History:  Diagnosis Date  . ANXIETY 04/07/2007  . Breast cancer of upper-outer quadrant of right female breast ([facility]) 01/11/2016  . COLONIC POLYPS, HX OF 04/02/2007   adenomatous  . DIVERTICULITIS, HX OF 04/02/2007  . Diverticulosis   . DJD (degenerative joint disease)   . Hemorrhoids   . HYPERTENSION 04/02/2007  . OSTEOARTHRITIS, SEVERE 04/07/2007  . 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 WEIGHT GAIN 11/13/2007    Past Surgical History:  Procedure Laterality Date  . APPENDECTOMY    . BREAST LUMPECTOMY WITH RADIOACTIVE SEED LOCALIZATION Right 02/08/2016   Procedure: RIGHT BREAST LUMPECTOMY WITH RADIOACTIVE SEED LOCALIZATION;  Surgeon: Fanny Skates, MD;  Location: [facility];  Service: General;  Laterality: Right;  . ORIF FEMUR FRACTURE Left 04/21/2018   Procedure: OPEN REDUCTION INTERNAL FIXATION (ORIF) PERI PROSTHETIC FEMUR FRACTURE;  Surgeon: Leandrew Koyanagi, MD;  Location: [facility];  Service: Orthopedics;  Laterality: Left;   . TONSILLECTOMY    . TOTAL HIP ARTHROPLASTY  1999, left hip    2006 right hip  . TUBAL LIGATION       Current Outpatient Medications  Medication Sig Dispense Refill  . acetaminophen (TYLENOL) 325 MG tablet Take 1-2 tablets (325-650 mg total) by mouth every 6 (six) hours as needed for mild pain (pain score 1-3 or temp > 100.5).    Marland Kitchen acetaminophen-codeine (TYLENOL #3) 300-30 MG tablet Take 1 tablet by mouth every 8 (eight) hours as needed for moderate pain. 30 tablet 1  . albuterol (VENTOLIN HFA) 108 (90 Base) MCG/ACT inhaler INHALE 2 PUFFS INTO THE LUNGS EVERY 6 (SIX) HOURS AS NEEDED FOR WHEEZING OR SHORTNESS OF BREATH. 18 g 0  . anastrozole (ARIMIDEX) 1 MG tablet TAKE 1 TABLET BY MOUTH DAILY 90 tablet 3  . Ascorbic Acid (VITAMIN C) 1000 MG tablet Take 1,000 mg by mouth daily.    Marland Kitchen BIOTIN 5000 PO Take 1 tablet by mouth daily.    . Calcium Carb-Cholecalciferol (CALCIUM 1000 + D PO) Take 1 tablet by mouth daily.    . calcium-vitamin D (OSCAL WITH D) 500-200 MG-UNIT tablet Take 1 tablet by mouth 3 (three) times daily. 90 tablet 12  . docusate sodium (COLACE) 100 MG capsule Take 1 capsule (100 mg total) by mouth 2 (two) times daily. 10 capsule 0  . hydrochlorothiazide (MICROZIDE)  12.5 MG capsule TAKE 1 CAPSULE BY MOUTH DAILY. 30 capsule 3  . HYDROcodone-acetaminophen (NORCO) 5-325 MG tablet Take 1-2 tablets by mouth 3 (three) times daily as needed for moderate pain. 30 tablet 0  . metoprolol tartrate (LOPRESSOR) 25 MG tablet TAKE 1 TABLET BY MOUTH DAILY. 90 tablet 1  . Multiple Vitamins-Minerals (CENTRUM SILVER ADULT 50+) TABS Take 1 tablet by mouth daily.    Marland Kitchen omega-3 acid ethyl esters (LOVAZA) 1 g capsule Take 1 g by mouth daily.     Marland Kitchen oxyCODONE-acetaminophen (PERCOCET) 5-325 MG tablet Take 1-2 tablets by mouth every 4 (four) hours as needed for severe pain. 30 tablet 0  . polyethylene glycol (MIRALAX / GLYCOLAX) packet Take 17 g by mouth daily as needed for mild constipation. 14 each 0  .  sertraline (ZOLOFT) 50 MG tablet TAKE 1 TABLET BY MOUTH DAILY. 90 tablet 1  . vitamin B-12 (CYANOCOBALAMIN) 1000 MCG tablet Take 1,000 mcg by mouth daily.    Marland Kitchen warfarin (COUMADIN) 5 MG tablet TAKE UP TO 2 TABLETS DAILY OR AS DIRECTED. 90 tablet 1  . zinc sulfate 220 (50 Zn) MG capsule Take 1 capsule (220 mg total) by mouth daily. 42 capsule 0   No current facility-administered medications for this visit.    Allergies:   Hydrocodone-acetaminophen and Tramadol    ROS:  Please see the history of present illness.   Otherwise, review of systems are positive for none.   All other systems are reviewed and negative.    PHYSICAL EXAM: VS:  BP 120/74   Temp (!) 97.2 F (36.2 C)   Ht 5\' 4"  (1.626 m)   Wt 159 lb 6.4 oz (72.3 kg)   SpO2 96%   BMI 27.36 kg/m  , BMI Body mass index is 27.36 kg/m. GENERAL:  Well appearing NECK:  No jugular venous distention, waveform within normal limits, carotid upstroke brisk and symmetric, no bruits, no thyromegaly LUNGS:  Clear to auscultation bilaterally CHEST:  Unremarkable HEART:  PMI not displaced or sustained,S1 and S2 within normal limits, no S3, no S4, no clicks, no rubs, no murmurs ABD:  Flat, positive bowel sounds normal in frequency in pitch, no bruits, no rebound, no guarding, no midline pulsatile mass, no hepatomegaly, no splenomegaly EXT:  2 plus pulses throughout, no edema, no cyanosis no clubbing   EKG:  EKG is ordered today. Sinus rhythm, rate 56, axis within normal limits, intervals within normal limits, no acute ST-T wave changes.   Recent Labs: 01/16/2019: ALT 14; BUN 22; Creatinine 0.83; Hemoglobin 13.5; Platelet Count 224; Potassium 4.6; Sodium 143    Lipid Panel    Component Value Date/Time   CHOL 200 10/06/2015 0846   TRIG 133.0 10/06/2015 0846   TRIG 109 04/02/2006 1019   HDL 53.20 10/06/2015 0846   CHOLHDL 4 10/06/2015 0846   VLDL 26.6 10/06/2015 0846   LDLCALC 121 (H) 10/06/2015 0846   LDLDIRECT 126.1 06/12/2013 0949       Wt Readings from Last 3 Encounters:  09/10/19 159 lb 6.4 oz (72.3 kg)  07/14/19 151 lb (68.5 kg)  04/29/19 151 lb (68.5 kg)      Other studies Reviewed: Additional studies/ records that were reviewed today include:None Review of the above records demonstrates:   NA   ASSESSMENT AND PLAN:   Atrial Fib:  Ms. MELICIA CATON has a CHA2DS2 - VASc score of 4.  She tolerates anticoagulation.  She could not afford DOAC.  No change in therapy.  She  had blood work in August and she was not anemic.  She should get this repeated by her primary care physician.  Covid education: She has had her [employer] vaccine.   Current medicines are reviewed at length with the patient today.  The patient has concerns regarding medicines.  The following changes have been made:  None  Labs/ tests ordered today include: None No orders of the defined types were placed in this encounter.    Disposition:   FU with me in 12 months.     Signed, [name], MD  09/10/2019 10:45 AM    [place]

## 2019-09-10 ENCOUNTER — Encounter: Payer: Self-pay | Admitting: Cardiology

## 2019-09-10 ENCOUNTER — Ambulatory Visit (INDEPENDENT_AMBULATORY_CARE_PROVIDER_SITE_OTHER): Payer: Medicare Other | Admitting: Pharmacist Clinician (PhC)/ Clinical Pharmacy Specialist

## 2019-09-10 ENCOUNTER — Other Ambulatory Visit: Payer: Self-pay

## 2019-09-10 ENCOUNTER — Ambulatory Visit (INDEPENDENT_AMBULATORY_CARE_PROVIDER_SITE_OTHER): Payer: Medicare Other | Admitting: Cardiology

## 2019-09-10 VITALS — BP 120/74 | Temp 97.2°F | Ht 64.0 in | Wt 159.4 lb

## 2019-09-10 DIAGNOSIS — I4891 Unspecified atrial fibrillation: Secondary | ICD-10-CM

## 2019-09-10 DIAGNOSIS — Z7901 Long term (current) use of anticoagulants: Secondary | ICD-10-CM | POA: Diagnosis not present

## 2019-09-10 DIAGNOSIS — I480 Paroxysmal atrial fibrillation: Secondary | ICD-10-CM

## 2019-09-10 DIAGNOSIS — Z7189 Other specified counseling: Secondary | ICD-10-CM

## 2019-09-10 LAB — POCT INR: INR: 1.9 — AB (ref 2.0–3.0)

## 2019-09-10 NOTE — Patient Instructions (Signed)
Medication Instructions:  No changes *If you need a refill on your cardiac medications before your next appointment, please call your pharmacy*  Lab Work: None ordered this visit.  Testing/Procedures: None ordered this visit  Follow-Up: At [facility], you and your health needs are our priority.  As part of our continuing mission to provide you with exceptional heart care, we have created designated Provider Care Teams.  These Care Teams include your primary Cardiologist (physician) and Advanced Practice Providers (APPs -  Physician Assistants and Nurse Practitioners) who all work together to provide you with the care you need, when you need it.  We recommend signing up for the patient portal called "MyChart".  Sign up information is provided on this After Visit Summary.  MyChart is used to connect with patients for Virtual Visits (Telemedicine).  Patients are able to view lab/test results, encounter notes, upcoming appointments, etc.  Non-urgent messages can be sent to your provider as well.   To learn more about what you can do with MyChart, go to [email or web address]://[email or web address].    Your next appointment:   12 month(s)  You will receive a reminder letter in the mail two months in advance. If you don't receive a letter, please call our office to schedule the follow-up appointment.  The format for your next appointment:   In Person  Provider:   James Hochrein, MD  

## 2019-09-18 ENCOUNTER — Other Ambulatory Visit: Payer: Self-pay | Admitting: Family Medicine

## 2019-09-18 ENCOUNTER — Other Ambulatory Visit: Payer: Self-pay

## 2019-09-21 ENCOUNTER — Other Ambulatory Visit: Payer: Self-pay

## 2019-09-21 ENCOUNTER — Ambulatory Visit (INDEPENDENT_AMBULATORY_CARE_PROVIDER_SITE_OTHER): Payer: Medicare Other | Admitting: Family Medicine

## 2019-09-21 ENCOUNTER — Encounter: Payer: Self-pay | Admitting: Family Medicine

## 2019-09-21 VITALS — BP 122/70 | HR 62 | Temp 97.8°F | Resp 12 | Ht 64.0 in | Wt 160.5 lb

## 2019-09-21 DIAGNOSIS — C50411 Malignant neoplasm of upper-outer quadrant of right female breast: Secondary | ICD-10-CM

## 2019-09-21 DIAGNOSIS — J449 Chronic obstructive pulmonary disease, unspecified: Secondary | ICD-10-CM

## 2019-09-21 DIAGNOSIS — I10 Essential (primary) hypertension: Secondary | ICD-10-CM

## 2019-09-21 DIAGNOSIS — Z170 Estrogen receptor positive status [ER+]: Secondary | ICD-10-CM | POA: Diagnosis not present

## 2019-09-21 DIAGNOSIS — Z0000 Encounter for general adult medical examination without abnormal findings: Secondary | ICD-10-CM

## 2019-09-21 DIAGNOSIS — R748 Abnormal levels of other serum enzymes: Secondary | ICD-10-CM | POA: Diagnosis not present

## 2019-09-21 MED ORDER — HYDROCHLOROTHIAZIDE 12.5 MG PO CAPS: 12.5000 mg | ORAL_CAPSULE | Freq: Every day | ORAL | 3 refills | Status: DC

## 2019-09-21 MED ORDER — ALBUTEROL SULFATE HFA 108 (90 BASE) MCG/ACT IN AERS: 2.0000 | INHALATION_SPRAY | Freq: Four times a day (QID) | RESPIRATORY_TRACT | 2 refills | Status: DC | PRN

## 2019-09-21 NOTE — Assessment & Plan Note (Signed)
Problem is well controlled. Continue albuterol inhaler 1 to 2 puff daily as needed.

## 2019-09-21 NOTE — Progress Notes (Signed)
Chief Complaint  Patient presents with  . Medication Refill    medication refill of blood pressure medicine  . Medicare Wellness    HPI:  Teresa Frey is a 86 y.o. female, who is here today for chronic disease management and AWV.  Last AWV 11/20/2016.  She lives alone. 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  Independent for most ADL's and IADL's. Uses a cane for transfer, she doe snot need it at home.  She had a fall on 04/12/2019. Problem is complicated with lumbar compression fracture and leave femoral fracture.  Immunization History  Administered Date(s) Administered  . Influenza Split 04/05/2011, 05/07/2012  . Influenza Whole 03/04/2006, 05/06/2007, 02/25/2008, 03/25/2010, 03/21/2013  . Influenza, High Dose Seasonal PF 04/05/2015, 02/29/2016, 04/19/2017, 04/04/2018  . Influenza,inj,Quad PF,6+ Mos 04/07/2014  . Janssen (J&J) SARS-COV-2 Vaccination 08/18/2019  . Pneumococcal Conjugate-13 06/12/2013  . Pneumococcal Polysaccharide-23 03/04/2006  . Tdap 01/24/2015  . Tetanus 06/12/2013  . Zoster 09/25/2013   Functional Status Survey: Is the patient deaf or have difficulty hearing?: No Does the patient have difficulty seeing, even when wearing glasses/contacts?: No Does the patient have difficulty concentrating, remembering, or making decisions?: No Does the patient have difficulty walking or climbing stairs?: Yes Does the patient have difficulty dressing or bathing?: No Does the patient have difficulty doing errands alone such as visiting a doctor's office or shopping?: No  Fall Risk  09/21/2019 04/29/2019 11/20/2016 02/23/2016 10/07/2015  Falls in the past year? 1 1 No Yes No  Comment - Emmi Telephone Survey: data to providers prior to load - - -  Number falls in past yr: 0 1 - 1 -  Comment - Emmi Telephone Survey Actual Response = 2 - - -  Injury with Fall? 1 1 - Yes -  Risk Factor Category  - - - - -  Risk for fall due to :  History of fall(s);Impaired balance/gait - - - -  Follow up Education provided - - - -   Providers she sees regularly: Eye care provider: [facility] ophthal[facility], 3 weeks ago. Oncology: Dr. Burr Medico. Breast cancer Dx'ed in 01/2016, s/p right breast lupectomy.Currently she is on Anastrozole 1 mg daily, 4th year. Ortho,Dr Erlinda Hong prn for hip ,knee,or lower back pain. Cardiologist, Dr Percival Spanish, last seen on 09/10/19.  Depression screen PHQ 2/9 09/21/2019  Decreased Interest 0  Down, Depressed, Hopeless 0  PHQ - 2 Score 0  Some recent data might be hidden     Hearing Screening   125Hz 250Hz 500Hz 1000Hz 2000Hz 3000Hz 4000Hz 6000Hz 8000Hz  Right ear:           Left ear:           Vision Screening Comments: Refused. Saw her eye care provider 3 weeks ago.  HTN: Checking BP at home sometimes, 120's/70's. Atrial fib on chronic anticoagulation. She is on HCTZ 12.5 mg daily and Metoprolol tartrate 25 mg daily.  PT checks her BP periodically. Negative for severe/frequent headache, visual changes, chest pain, dyspnea, focal weakness, or edema.  Lab Results  Component Value Date   CREATININE 0.83 01/16/2019   BUN 22 01/16/2019   NA 143 01/16/2019   K 4.6 01/16/2019   CL 106 01/16/2019   CO2 29 01/16/2019   No cane at at home. In 04/2019 fell in her deck, tried to grab knob,slid and fell.  She did PT until a montha ago, she is doing PT exercises at home. She cooks and  follows a healthful diet.  Lab Results  Component Value Date   CHOL 200 10/06/2015   HDL 53.20 10/06/2015   LDLCALC 121 (H) 10/06/2015   LDLDIRECT 126.1 06/12/2013   TRIG 133.0 10/06/2015   CHOLHDL 4 10/06/2015   Elevated alk phosphatase mildly elevated.  Lab Results  Component Value Date   ALT 14 01/16/2019   AST 17 01/16/2019   ALKPHOS 202 (H) 01/16/2019   BILITOT 0.3 01/16/2019   COPD: She is on Albuterol inh 2 puff q 4-6 hours prn. Wheezing and exertional dyspnea. Occasional productive cough. She does not use  Albuterol often.  Anxiety: She is on Zoloft 50 mg daily. She has taken medication for years,still helping and tolerating well. Negative for depressed mood.  Review of Systems  Constitutional: Negative for activity change, appetite change, fatigue and fever.  HENT: Negative for mouth sores, nosebleeds and sore throat.   Eyes: Negative for redness and visual disturbance.  Cardiovascular: Negative for chest pain and leg swelling.  Gastrointestinal: Negative for abdominal pain, nausea and vomiting.       Negative for changes in bowel habits.  Genitourinary: Negative for decreased urine volume, dysuria and hematuria.  Musculoskeletal: Positive for arthralgias and gait problem.  Skin: Negative for rash and wound.  Neurological: Negative for weakness and headaches.  Psychiatric/Behavioral: Negative for confusion and hallucinations.  Rest of ROS, see pertinent positives sand negatives in HPI  Current Outpatient Medications on File Prior to Visit  Medication Sig Dispense Refill  . acetaminophen (TYLENOL) 325 MG tablet Take 1-2 tablets (325-650 mg total) by mouth every 6 (six) hours as needed for mild pain (pain score 1-3 or temp > 100.5).    Marland Kitchen anastrozole (ARIMIDEX) 1 MG tablet TAKE 1 TABLET BY MOUTH DAILY 90 tablet 3  . Ascorbic Acid (VITAMIN C) 1000 MG tablet Take 1,000 mg by mouth daily.    Marland Kitchen BIOTIN 5000 PO Take 1 tablet by mouth daily.    . Calcium Carb-Cholecalciferol (CALCIUM 1000 + D PO) Take 1 tablet by mouth daily.    . calcium-vitamin D (OSCAL WITH D) 500-200 MG-UNIT tablet Take 1 tablet by mouth 3 (three) times daily. 90 tablet 12  . docusate sodium (COLACE) 100 MG capsule Take 1 capsule (100 mg total) by mouth 2 (two) times daily. 10 capsule 0  . metoprolol tartrate (LOPRESSOR) 25 MG tablet TAKE 1 TABLET BY MOUTH DAILY. 90 tablet 1  . Multiple Vitamins-Minerals (CENTRUM SILVER ADULT 50+) TABS Take 1 tablet by mouth daily.    Marland Kitchen omega-3 acid ethyl esters (LOVAZA) 1 g capsule Take 1 g  by mouth daily.     . polyethylene glycol (MIRALAX / GLYCOLAX) packet Take 17 g by mouth daily as needed for mild constipation. 14 each 0  . sertraline (ZOLOFT) 50 MG tablet TAKE 1 TABLET BY MOUTH DAILY. 90 tablet 1  . vitamin B-12 (CYANOCOBALAMIN) 1000 MCG tablet Take 1,000 mcg by mouth daily.    Marland Kitchen warfarin (COUMADIN) 5 MG tablet TAKE UP TO 2 TABLETS DAILY OR AS DIRECTED. 90 tablet 1  . zinc sulfate 220 (50 Zn) MG capsule Take 1 capsule (220 mg total) by mouth daily. 42 capsule 0   No current facility-administered medications on file prior to visit.    Past Medical History:  Diagnosis Date  . ANXIETY 04/07/2007  . Breast cancer of upper-outer quadrant of right female breast ([place]) 01/11/2016  . COLONIC POLYPS, HX OF 04/02/2007   adenomatous  . DIVERTICULITIS, HX OF 04/02/2007  .  Diverticulosis   . DJD (degenerative joint disease)   . Hemorrhoids   . HYPERTENSION 04/02/2007  . OSTEOARTHRITIS, SEVERE 04/07/2007  . 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 WEIGHT GAIN 11/13/2007   Allergies  Allergen Reactions  . Hydrocodone-Acetaminophen   . Tramadol     Loss of control    Social History   Socioeconomic History  . Marital status: Married    Spouse name: Not on file  . Number of children: 2  . Years of education: Not on file  . Highest education level: Not on file  Occupational History  . Not on file  Tobacco Use  . Smoking status: Former Smoker    Packs/day: 1.00    Years: 27.00    Pack years: 27.00    Types: Cigarettes    Quit date: 06/04/1978    Years since quitting: 41.3  . Smokeless tobacco: Never Used  Substance and Sexual Activity  . Alcohol use: Yes    Alcohol/week: 7.0 standard drinks    Types: 7 Glasses of wine per week    Comment: 1-2 glasses wine after dinner, but not necessarily each day  . Drug use: No  . Sexual activity: Not on file  Other Topics Concern  . Not on file  Social History Narrative  . Not on file   Social Determinants of Health     Financial Resource Strain:   . Difficulty of Paying Living Expenses:   Food Insecurity:   . Worried About Charity fundraiser in the Last Year:   . Arboriculturist in the Last Year:   Transportation Needs:   . Film/video editor (Medical):   Marland Kitchen Lack of Transportation (Non-Medical):   Physical Activity:   . Days of Exercise per Week:   . Minutes of Exercise per Session:   Stress:   . Feeling of Stress :   Social Connections:   . Frequency of Communication with Friends and Family:   . Frequency of Social Gatherings with Friends and Family:   . Attends Religious Services:   . Active Member of Clubs or Organizations:   . Attends Archivist Meetings:   Marland Kitchen Marital Status:     Vitals:   09/21/19 1127  BP: 122/70  Pulse: 62  Resp: 12  Temp: 97.8 F (36.6 C)  SpO2: 96%   Body mass index is 27.55 kg/m.  Physical Exam  Nursing note and vitals reviewed. Constitutional: She is oriented to person, place, and time. She appears well-developed. No distress.  HENT:  Head: Normocephalic and atraumatic.  Mouth/Throat: Oropharynx is clear and moist and mucous membranes are normal.  Eyes: Pupils are equal, round, and reactive to light. Conjunctivae are normal.  Cardiovascular: Normal rate and regular rhythm.  No murmur heard. Pulses:      Posterior tibial pulses are 2+ on the right side and 2+ on the left side.  Respiratory: Effort normal and breath sounds normal. No respiratory distress.  GI: Soft. She exhibits no mass. There is no abdominal tenderness.  Musculoskeletal:        General: No edema.  Lymphadenopathy:    She has no cervical adenopathy.  Neurological: She is alert and oriented to person, place, and time. She has normal strength. No cranial nerve deficit.  Antalgic,unstable gait assisted with a cane.  Skin: Skin is warm. No rash noted. No erythema.  Psychiatric: She has a normal mood and affect.  Well groomed, good eye contact.  ASSESSMENT AND  PLAN:  Teresa Frey was seen today for chronic disease management and AWV  Medicare annual wellness visit, subsequent We discussed the importance of staying active, physically and mentally, as well as the benefits of a healthy/balance diet. Low impact exercise as tolerated that involves stretching and strengthing are ideal. Vaccines up to date.  We discussed preventive screening for the next 5-10 years, summery of recommendations given in AVS.  Fall prevention.  Advance directives and end of life discussed, she has POA and living will.  Elevated alkaline phosphatase level Mild. Could be related to recent fracture. According to pt,she is having labs at her oncologist's office.  Malignant neoplasm of upper-outer quadrant of right breast in female, estrogen receptor positive ([place]) On her 4th year of Anastrozole. Following with oncologist.  COPD (chronic obstructive pulmonary disease) ([place]) Problem is well controlled. Continue albuterol inhaler 1 to 2 puff daily as needed.  Essential hypertension Problem is well controlled. Continue metoprolol tartrate 25 mg twice daily and HCTZ 12.5 mg daily. Recommend monitoring BP regularly. Continue low-salt diet. Eye exam is current.   Return in about 6 months (around 03/22/2020). Then we can continue following annually.    Annamary Buschman G. [name], MD  [facility]. [place] office.

## 2019-09-21 NOTE — Patient Instructions (Addendum)
  Ms. Detjen , Thank you for taking time to come for your Medicare Wellness Visit. I appreciate your ongoing commitment to your health goals. Please review the following plan we discussed and let me know if I can assist you in the future.   These are the goals we discussed: Goals    . Prevent falls     Continue daily low impact exercise. Fall precautions. Continue calcium and vit D.        This is a list of the screening recommended for you and due dates:  Health Maintenance  Topic Date Due  . COVID-19 Vaccine (1) Never done  . Flu Shot  01/03/2020  . Mammogram  01/15/2020  . Tetanus Vaccine  01/23/2025  . DEXA scan (bone density measurement)  Completed  . Pneumonia vaccines  Completed  . Colon Cancer Screening  Discontinued   A few tips:  -As we age balance is not as good as it was, so there is a higher risks for falls. Please remove small rugs and furniture that is "in your way" and could increase the risk of falls. Stretching exercises may help with fall prevention: Yoga and Tai Chi are some examples. Low impact exercise is better, so you are not very achy the next day.  -Sun screen and avoidance of direct sun light recommended. Caution with dehydration, if working outdoors be sure to drink enough fluids.  - Some medications are not safe as we age, increases the risk of side effects and can potentially interact with other medication you are also taken;  including some of over the counter medications. Be sure to let me know when you start a new medication even if it is a dietary/vitamin supplement.   -Healthy diet low in red meet/animal fat and sugar + regular physical activity is recommended.

## 2019-09-21 NOTE — Assessment & Plan Note (Signed)
Problem is well controlled. Continue metoprolol tartrate 25 mg twice daily and HCTZ 12.5 mg daily. Recommend monitoring BP regularly. Continue low-salt diet. Eye exam is current.

## 2019-10-05 ENCOUNTER — Telehealth: Payer: Self-pay | Admitting: Physical Medicine and Rehabilitation

## 2019-10-05 NOTE — Telephone Encounter (Signed)
ok 

## 2019-10-05 NOTE — Telephone Encounter (Signed)
Patient is requesting another injection. Bilateral L4-5 facets 02/03/2019. Ok to repeat if helped, same problem/side, and no new injury?

## 2019-10-06 NOTE — Telephone Encounter (Signed)
Called patient to schedule. Offered her our next available date of 5/24. Call ended before patient accepted appointment. I called her back to get her scheduled and had to leave a message.

## 2019-10-07 NOTE — Telephone Encounter (Signed)
Left message #2

## 2019-10-12 NOTE — Telephone Encounter (Signed)
Scheduled for 6/7 at 1030 with driver.

## 2019-10-21 ENCOUNTER — Other Ambulatory Visit: Payer: Self-pay

## 2019-10-21 ENCOUNTER — Ambulatory Visit (INDEPENDENT_AMBULATORY_CARE_PROVIDER_SITE_OTHER): Payer: Medicare Other | Admitting: Pharmacist Clinician (PhC)/ Clinical Pharmacy Specialist

## 2019-10-21 DIAGNOSIS — I4891 Unspecified atrial fibrillation: Secondary | ICD-10-CM | POA: Diagnosis not present

## 2019-10-21 DIAGNOSIS — Z7901 Long term (current) use of anticoagulants: Secondary | ICD-10-CM | POA: Diagnosis not present

## 2019-10-21 LAB — POCT INR: INR: 3.6 — AB (ref 2.0–3.0)

## 2019-11-09 ENCOUNTER — Ambulatory Visit: Payer: Self-pay

## 2019-11-09 ENCOUNTER — Encounter: Payer: Self-pay | Admitting: Physical Medicine and Rehabilitation

## 2019-11-09 ENCOUNTER — Ambulatory Visit: Payer: Medicare Other | Admitting: Physical Medicine and Rehabilitation

## 2019-11-09 ENCOUNTER — Other Ambulatory Visit: Payer: Self-pay

## 2019-11-09 VITALS — BP 147/67 | HR 58

## 2019-11-09 DIAGNOSIS — M47816 Spondylosis without myelopathy or radiculopathy, lumbar region: Secondary | ICD-10-CM

## 2019-11-09 DIAGNOSIS — G8929 Other chronic pain: Secondary | ICD-10-CM

## 2019-11-09 DIAGNOSIS — M4316 Spondylolisthesis, lumbar region: Secondary | ICD-10-CM | POA: Diagnosis not present

## 2019-11-09 DIAGNOSIS — M545 Low back pain, unspecified: Secondary | ICD-10-CM

## 2019-11-09 DIAGNOSIS — M25552 Pain in left hip: Secondary | ICD-10-CM

## 2019-11-09 DIAGNOSIS — M25551 Pain in right hip: Secondary | ICD-10-CM

## 2019-11-09 MED ORDER — METHYLPREDNISOLONE ACETATE 80 MG/ML IJ SUSP
40.0000 mg | Freq: Once | INTRAMUSCULAR | Status: AC
  Administered 2019-11-09: 40 mg

## 2019-11-09 NOTE — Progress Notes (Signed)
Numeric Pain Rating Scale and Functional Assessment Average Pain 9   In the last MONTH (on 0-10 scale) has pain interfered with the following?  1. General activity like being  able to carry out your everyday physical activities such as walking, climbing stairs, carrying groceries, or moving a chair?  Rating(10)   +Driver, +BT, -Dye Allergies. **Warfarin** Pain in low back only, 2018 Dr. Burr Medico bone density wnl.

## 2019-11-10 ENCOUNTER — Encounter: Payer: Self-pay | Admitting: Physical Medicine and Rehabilitation

## 2019-11-10 NOTE — Procedures (Signed)
Lumbar Facet Joint Intra-Articular Injection(s) with Fluoroscopic Guidance  Patient: Teresa Frey      Date of Birth: [birth year] MRN: [id number] PCP: [name], [name], MD      Visit Date: 11/09/2019   Universal Protocol:    Date/Time: 11/09/2019  Consent Given By: the patient  Position: PRONE   Additional Comments: Vital signs were monitored before and after the procedure. Patient was prepped and draped in the usual sterile fashion. The correct patient, procedure, and site was verified.   Injection Procedure Details:  Procedure Site One Meds Administered:  Meds ordered this encounter  Medications  . methylPREDNISolone acetate (DEPO-MEDROL) injection 40 mg     Laterality: Bilateral  Location/Site: Patient with transitional L5 segment which is essentially sacralized.  Best way to view this is that she has full ribs at T12 and you can measure down from there. L4-L5  Needle size: 22 guage  Needle type: Spinal  Needle Placement: Articular  Findings:  -Comments: Excellent flow of contrast producing a partial arthrogram.  Procedure Details: The fluoroscope beam is vertically oriented in AP, and the inferior recess is visualized beneath the lower pole of the inferior apophyseal process, which represents the target point for needle insertion. When direct visualization is difficult the target point is located at the medial projection of the vertebral pedicle. The region overlying each aforementioned target is locally anesthetized with a 1 to 2 ml. volume of 1% Lidocaine without Epinephrine.   The spinal needle was inserted into each of the above mentioned facet joints using biplanar fluoroscopic guidance. A 0.25 to 0.5 ml. volume of Isovue-250 was injected and a partial facet joint arthrogram was obtained. A single spot film was obtained of the resulting arthrogram.    One to 1.25 ml of the steroid/anesthetic solution was then injected into each of the facet joints noted  above.   Additional Comments:  The patient tolerated the procedure well Dressing: 2 x 2 sterile gauze and Band-Aid    Post-procedure details: Patient was observed during the procedure. Post-procedure instructions were reviewed.  Patient left the clinic in stable condition.

## 2019-11-10 NOTE — Progress Notes (Signed)
[name] - 86 y.o. female MRN [id number]  Date of birth: May 03, 1930  Office Visit Note: Visit Date: 11/09/2019 PCP: [name], [name], MD Referred by: [name], [name], MD  Subjective: Chief Complaint  Patient presents with  . Lower Back - Pain   HPI: Teresa Frey is a 86 y.o. female who comes in today For evaluation management of chronic worsening severe low back pain and hip pain.  She is followed by Dr. Eduard Roux in the office and has had bilateral total hip replacements.  She has had periprosthetic hip fracture on the left and femur fracture on the left since I have seen her.  In September of last year we completed bilateral facet joint blocks at L4-5.  She has a complicated spine picture with transitional segment at L5 with basically fully sacralized L5 segment.  She does have well-formed ribs at T12 using that numbering scheme.  She has small listhesis of L4 on L5 she also has facet arthropathy at L3-4.  CT scan was completed after I saw her and this was done in November.  This shows no high-grade central stenosis but again shows the facet arthropathy as well as Paget's disease at L4-5 and L5.  She complains of severe low back pain worse with standing and ambulating.  No radicular pain down the legs no paresthesia.  She stands with a forward flexed lumbar spine and is very stiff and hard to extend.  She does not really report any upper lumbar pain or mid back pain.  She has a history of compression fractures noted in the CT scan.  She has a history of long-term Coumadin use as well as long-term Arimidex.  She has been followed from a bone density standpoint by Dr. Burr Medico.  Last bone density test was performed in 2018 which was normal.  She is not on any medication for osteoporosis.  She has had no real falls since I last saw her except 1 ground-level fall to the right where she had bruising around the right greater trochanter.  She does not note any focal weakness.  Last injection gave her more  than 50% relief for several months.  She has had therapy in the past on multiple occasions for her hip and low back.  Review of Systems  Constitutional: Negative for chills, fever, malaise/fatigue and weight loss.  HENT: Negative for hearing loss and sinus pain.   Eyes: Negative for blurred vision, double vision and photophobia.  Respiratory: Negative for cough and shortness of breath.   Cardiovascular: Negative for chest pain, palpitations and leg swelling.  Gastrointestinal: Negative for abdominal pain, nausea and vomiting.  Genitourinary: Negative for flank pain.  Musculoskeletal: Positive for back pain and joint pain. Negative for myalgias.  Skin: Negative for itching and rash.  Neurological: Negative for tremors, focal weakness and weakness.  Endo/Heme/Allergies: Negative.   Psychiatric/Behavioral: Negative for depression.  All other systems reviewed and are negative.  Otherwise per HPI.  Assessment & Plan: Visit Diagnoses:  1. Spondylosis without myelopathy or radiculopathy, lumbar region   2. Chronic bilateral low back pain without sciatica   3. Pain in left hip   4. Pain in right hip   5. Spondylolisthesis of lumbar region     Plan: Findings:  Chronic worsening axial low back pain lumbosacral pain which is most likely still facet mediated pain at L4-5 but could be L3-4.  Differential diagnosis would be do include sacral insufficiency fracture although there is no imaging at  this point.  She is not getting a great deal of pain when we stand and walk although she is having pain across that area.  She rates her pain is quite severe however.  At this point we are going to repeat the facet joint blocks at L4-5.  If she gets great relief obviously this could be repeated from time to time without much issue.  We may go on to look at diagnostic medial branch blocks and ablation.  If it does not seem to help very long or very much she really needs an MRI of the lumbar spine.  She has no  contraindications to MRI.    Meds & Orders:  Meds ordered this encounter  Medications  . methylPREDNISolone acetate (DEPO-MEDROL) injection 40 mg    Orders Placed This Encounter  Procedures  . Facet Injection  . XR C-ARM NO REPORT    Follow-up: Return if symptoms worsen or fail to improve.   Procedures: No procedures performed  Lumbar Facet Joint Intra-Articular Injection(s) with Fluoroscopic Guidance  Patient: Teresa Frey      Date of Birth: [birth year] MRN: [id number] PCP: [name], [name], MD      Visit Date: 11/09/2019   Universal Protocol:    Date/Time: 11/09/2019  Consent Given By: the patient  Position: PRONE   Additional Comments: Vital signs were monitored before and after the procedure. Patient was prepped and draped in the usual sterile fashion. The correct patient, procedure, and site was verified.   Injection Procedure Details:  Procedure Site One Meds Administered:  Meds ordered this encounter  Medications  . methylPREDNISolone acetate (DEPO-MEDROL) injection 40 mg     Laterality: Bilateral  Location/Site: Patient with transitional L5 segment which is essentially sacralized.  Best way to view this is that she has full ribs at T12 and you can measure down from there. L4-L5  Needle size: 22 guage  Needle type: Spinal  Needle Placement: Articular  Findings:  -Comments: Excellent flow of contrast producing a partial arthrogram.  Procedure Details: The fluoroscope beam is vertically oriented in AP, and the inferior recess is visualized beneath the lower pole of the inferior apophyseal process, which represents the target point for needle insertion. When direct visualization is difficult the target point is located at the medial projection of the vertebral pedicle. The region overlying each aforementioned target is locally anesthetized with a 1 to 2 ml. volume of 1% Lidocaine without Epinephrine.   The spinal needle was inserted into each of the  above mentioned facet joints using biplanar fluoroscopic guidance. A 0.25 to 0.5 ml. volume of Isovue-250 was injected and a partial facet joint arthrogram was obtained. A single spot film was obtained of the resulting arthrogram.    One to 1.25 ml of the steroid/anesthetic solution was then injected into each of the facet joints noted above.   Additional Comments:  The patient tolerated the procedure well Dressing: 2 x 2 sterile gauze and Band-Aid    Post-procedure details: Patient was observed during the procedure. Post-procedure instructions were reviewed.  Patient left the clinic in stable condition.     Clinical History: CLINICAL DATA:  Acute midline low back pain. History of breast cancer.  EXAM: CT LUMBAR SPINE WITHOUT CONTRAST  TECHNIQUE: Multidetector CT imaging of the lumbar spine was performed without intravenous contrast administration. Multiplanar CT image reconstructions were also generated.  COMPARISON:  CT lumbar 04/12/2019.  MRI lumbar 10/24/2013  FINDINGS: Segmentation: Incomplete segmentation of L4 and L5. L5 is incorporated  into the sacrum. Numbering is consistent with the recent CT. There are well developed ribs at T12.  Alignment: 3 mm anterolisthesis L3-4 with slight progression from the recent CT.  Vertebrae: Moderate superior endplate fracture G38 is unchanged appears chronic  Moderate superior endplate fracture of L1 is unchanged. This may be more recent with some increased density of the bone due to bone healing. In addition, there appears to be acute cortical fracture of the superior and posterior vertebral body consistent with a recent fracture. This is unchanged from the prior study.  Cortical thickening and medullary changes of Paget's disease L4, L5, and the sacrum extending into the left iliac bone unchanged from the prior study. No evidence of metastatic disease.  Paraspinal and other soft tissues: Mild  atherosclerotic calcification without aneurysm. No paraspinous mass or edema.  Disc levels: T11-12: Mild disc and mild facet degeneration. Negative for stenosis  T12-L1: Disc and facet degeneration.  Negative for stenosis  L1-2: Mild disc bulging and facet degeneration. Mild spinal stenosis.  L2-3: Advanced disc degeneration with disc space narrowing and diffuse endplate spurring. Bilateral facet degeneration. Mild narrowing of the canal without significant stenosis.  L3-4: 3 mm anterolisthesis has developed progressed since the recent study. Moderate facet degeneration bilaterally. Disc bulging with mild spinal stenosis. Mild subarticular stenosis bilaterally  L4-5: Disc space narrowing and probable arthrodesis. No significant spinal or foraminal stenosis  S1-2: Fused segment.  No stenosis.  IMPRESSION: 1. Compression fracture of T12 is unchanged and appears chronic 2. Compression fracture L1 is unchanged and appears more recent. 3. Paget's disease L4-L5, sacrum, and left iliac bone. Negative for metastatic disease 4. Multilevel disc and facet degeneration. Progressive mild anterolisthesis L3-4 likely instability due to disc and facet degeneration.   Electronically Signed   By: Franchot Gallo M.D.   On: 04/28/2019 17:03 ------  MRI LUMBAR SPINE WITHOUT CONTRAST   IMPRESSION:  1. Transitional lumbosacral anatomy. As correlated with prior  radiographs/CT, the L5 segment is largely sacralized.  2. Interval superior endplate Schmorl's node at T12, well healed. No  acute osseous findings.  3. Stable pagetoid changes with lower lumbar spine and sacrum. No  evidence of malignant degeneration.  4. Stable chronic degenerative disc disease at L2-3 and L4-5. No  acute findings, spinal stenosis or nerve root encroachment  identified.      Electronically Signed  By: Camie Patience M.D.  On: 10/24/2013 11:52   She reports that she quit smoking about 41  years ago. Her smoking use included cigarettes. She has a 27.00 pack-year smoking history. She has never used smokeless tobacco. No results for input(s): HGBA1C, LABURIC in the last 8760 hours.  Objective:  VS:  HT:    WT:   BMI:     BP:(!) 147/67  HR:(!) 58bpm  TEMP: ( )  RESP:  Physical Exam Vitals and nursing note reviewed.  Constitutional:      General: She is not in acute distress.    Appearance: Normal appearance. She is well-developed. She is not ill-appearing.  HENT:     Head: Normocephalic and atraumatic.  Eyes:     Conjunctiva/sclera: Conjunctivae normal.     Pupils: Pupils are equal, round, and reactive to light.  Cardiovascular:     Rate and Rhythm: Normal rate.     Pulses: Normal pulses.  Pulmonary:     Effort: Pulmonary effort is normal.  Musculoskeletal:        General: Tenderness present.     Right lower leg: No  edema.     Left lower leg: No edema.     Comments: Patient ambulates with antalgic gait more to the left using a cane.  She is slow to rise from a seated position.  She stands with a significantly forward flexed lumbosacral spine.  No increased kyphosis.  She has pain with facet loading and extension.  She does have some deep tender points or trigger points in the paraspinal region.  She has no pain with hip rotation she has good distal strength.  Skin:    General: Skin is warm and dry.     Findings: No erythema or rash.  Neurological:     General: No focal deficit present.     Mental Status: She is alert and oriented to person, place, and time.     Sensory: No sensory deficit.     Motor: No weakness or abnormal muscle tone.     Coordination: Coordination normal.     Gait: Gait abnormal.  Psychiatric:        Mood and Affect: Mood normal.        Behavior: Behavior normal.     Ortho Exam  Imaging: XR C-ARM NO REPORT  Result Date: 11/09/2019 Please see Notes tab for imaging impression.   Past Medical/Family/Surgical/Social History: Medications  & Allergies reviewed per EMR, new medications updated. Patient Active Problem List   Diagnosis Date Noted  . COPD (chronic obstructive pulmonary disease) ([place]) 09/21/2019  . Educated about COVID-19 virus infection 09/09/2019  . Spondylosis without myelopathy or radiculopathy, lumbar region 02/12/2019  . Spondylolisthesis of lumbar region 02/12/2019  . Pain of left hip joint 12/23/2018  . Primary osteoarthritis of left knee 12/23/2018  . Periprosthetic fracture around internal prosthetic left hip joint ([place]) 04/19/2018  . Long term (current) use of anticoagulants [Z79.01] 08/14/2017  . Genetic testing 04/01/2016  . possible sleep apnea by history 02/12/2016  . A-fib ([place]) 02/11/2016  . Surgical wound infection 02/11/2016  . Breast cancer of upper-outer quadrant of right female breast ([place]) 01/11/2016  . Impaired glucose tolerance 07/28/2014  . Anxiety disorder, unspecified 04/07/2007  . Osteoarthritis 04/07/2007  . Essential hypertension 04/02/2007  . History of colonic polyps 04/02/2007  . DIVERTICULITIS, HX OF 04/02/2007   Past Medical History:  Diagnosis Date  . ANXIETY 04/07/2007  . Breast cancer of upper-outer quadrant of right female breast ([place]) 01/11/2016  . COLONIC POLYPS, HX OF 04/02/2007   adenomatous  . DIVERTICULITIS, HX OF 04/02/2007  . Diverticulosis   . DJD (degenerative joint disease)   . Hemorrhoids   . HYPERTENSION 04/02/2007  . OSTEOARTHRITIS, SEVERE 04/07/2007  . 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 WEIGHT GAIN 11/13/2007   Family History  Problem Relation Age of Onset  . Pancreatic cancer Mother 36       d. 71  . Cancer Father 34       hodgkin's lymphoma and leukemia; d. 25  . Colon cancer Brother        dx. late 54s; smoker  . Pancreatic cancer Brother 80       d. 61; smoker  . Lung cancer Brother 40       smoker  . Heart attack Brother        d. late 4s  . Colon cancer Paternal Aunt   . Breast cancer Daughter 33       negative  genetic testing in 2016  . Skin cancer Daughter  non-melanoma type; +sun exposure  . Kidney failure Son 56       +EtOH abuse resulting in liver, kidney, and pancreas failure  . Diabetes Maternal Uncle        d. later age  . Heart disease Neg Hx        family  . Esophageal cancer Neg Hx   . Rectal cancer Neg Hx   . Stomach cancer Neg Hx    Past Surgical History:  Procedure Laterality Date  . APPENDECTOMY    . BREAST LUMPECTOMY WITH RADIOACTIVE SEED LOCALIZATION Right 02/08/2016   Procedure: RIGHT BREAST LUMPECTOMY WITH RADIOACTIVE SEED LOCALIZATION;  Surgeon: Fanny Skates, MD;  Location: [facility];  Service: General;  Laterality: Right;  . ORIF FEMUR FRACTURE Left 04/21/2018   Procedure: OPEN REDUCTION INTERNAL FIXATION (ORIF) PERI PROSTHETIC FEMUR FRACTURE;  Surgeon: Leandrew Koyanagi, MD;  Location: [facility];  Service: Orthopedics;  Laterality: Left;  . TONSILLECTOMY    . TOTAL HIP ARTHROPLASTY  1999, left hip    2006 right hip  . TUBAL LIGATION     Social History   Occupational History  . Not on file  Tobacco Use  . Smoking status: Former Smoker    Packs/day: 1.00    Years: 27.00    Pack years: 27.00    Types: Cigarettes    Quit date: 06/04/1978    Years since quitting: 41.4  . Smokeless tobacco: Never Used  Substance and Sexual Activity  . Alcohol use: Yes    Alcohol/week: 7.0 standard drinks    Types: 7 Glasses of wine per week    Comment: 1-2 glasses wine after dinner, but not necessarily each day  . Drug use: No  . Sexual activity: Not on file

## 2019-11-12 NOTE — Progress Notes (Signed)
[place] OFFICE PROGRESS NOTE  [name], [name], MD [place] [address]  DIAGNOSIS: CHIEF COMPLAINT: Follow up right breast cancer  Oncology History Overview Note  Breast cancer of upper-outer quadrant of right female breast [facility])   Staging form: Breast, AJCC 7th Edition   - Clinical stage from 01/18/2016: Stage IA (T1b, N0, M0) - Unsigned   - Pathologic stage from 02/08/2016: Stage IA (T1c, N0, cM0) - Signed by Truitt Merle, MD on 02/27/2016    Breast cancer of upper-outer quadrant of right female breast ([place])  01/05/2016 Mammogram   Diagnostic mammogram and ultrasound showed a 9 mm mass in the upper outer quadrant of right breast, axillary nodes were negative.   01/09/2016 Initial Diagnosis   Breast cancer of upper-outer quadrant of right female breast ([place])   01/09/2016 Initial Biopsy   R breast 9:00 position biopsy showed invasive ductal carcinoma, G1-2   01/09/2016 Receptors her2   ER 90%+, PR 40%+, HER2-, Ki67 5%   02/08/2016 Surgery   Right breast lumpectomy, no SLN biopsy     02/08/2016 Pathology Results   Right breast lumpectomy showed invasive and in situ ductal carcinoma, 1.1 cm, margins were negative. Grade 1, no lymphovascular invasion. Atypical lobular hyperplasia.   02/21/2016 Genetic Testing   Genetic testing did not reveal a known deleterious mutation.  Genetic testing did identify a variant of uncertain significance (VUS) called "c.1659G>A (p.Met553Ile)" in one copy of the NBN gene.  Genes tested include: APC, ATM, AXIN2, BARD1, BMPR1A, BRCA1, BRCA2, BRIP1, CDH1, CDK4, CDKN2A, CHEK2, EPCAM, FANCC, MLH1, MSH2, MSH6, MUTYH, NBN, PALB2, PMS2, POLD1, POLE, PTEN, RAD51C, RAD51D, SCG5/GREM1, SMAD4, STK11, TP53, VHL, and XRCC2.   02/28/2016 -  Anti-estrogen oral therapy   Anastrozole 1 mg daily     CURRENT THERAPY: anastrozole 41m daily started on 02/28/2016  INTERVAL HISTORY: Teresa Frey 86y.o. female returns to the clinic for a follow  up visit. The patient is feeling well today without any concerning complaints. At the patient's last appointment, it was discussed how she had a left hip fracture in early 2021. She also had a lumbar compression fracture in 04/2019. She had a loose tooth at her last appointment and was instructed to discuss with her dentist about recommendations regarding starting a bisphosphonate. She had not asked her dentist about taking this at this time. I inquired if she had thought about starting this medication after her discussion with Dr. FBurr Medicoat her last visit. I discussed this medication, benefits, side effects, and dosing options. She mentioned that she is not interested in this medication at this time. She ambulates with a cane. She lives alone but her 170year old great granddaughter sometimes stays with her.   Otherwise, the patient is feeling well. Denies any recent fevers, chills, night sweats, lymphadenopathy, or weight loss. Denies cough or shortness of breath, denies any bone pain except for chronic low back pain which is unchanged. She also had some cramping in her hands. She denies any breast changes including erythema, thickening, masses, nipple discharge, or nipple inversion. The patient's last mammogram was August 2020. She believes she is already scheduled for her next mammogram in August in 2021. She states that it is important to her to follow up on that given her family history of breast cancer. She also performs routine breast exams on herself. She does not have any concerning palpable lesions today. Her last DEXA scan was August 2018. She does not have another  DEXA scan arranged to her knowledge. She is here for evaluation and a follow up visit.    MEDICAL HISTORY: Past Medical History:  Diagnosis Date  . ANXIETY 04/07/2007  . Breast cancer of upper-outer quadrant of right female breast ([place]) 01/11/2016  . COLONIC POLYPS, HX OF 04/02/2007   adenomatous  . DIVERTICULITIS, HX OF 04/02/2007  .  Diverticulosis   . DJD (degenerative joint disease)   . Hemorrhoids   . HYPERTENSION 04/02/2007  . OSTEOARTHRITIS, SEVERE 04/07/2007  . 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 WEIGHT GAIN 11/13/2007    ALLERGIES:  is allergic to hydrocodone-acetaminophen and tramadol.  MEDICATIONS:  Current Outpatient Medications  Medication Sig Dispense Refill  . acetaminophen (TYLENOL) 325 MG tablet Take 1-2 tablets (325-650 mg total) by mouth every 6 (six) hours as needed for mild pain (pain score 1-3 or temp > 100.5).    Marland Kitchen albuterol (VENTOLIN HFA) 108 (90 Base) MCG/ACT inhaler Inhale 2 puffs into the lungs every 6 (six) hours as needed for wheezing or shortness of breath. 18 g 2  . anastrozole (ARIMIDEX) 1 MG tablet TAKE 1 TABLET BY MOUTH DAILY 90 tablet 3  . Ascorbic Acid (VITAMIN C) 1000 MG tablet Take 1,000 mg by mouth daily.    Marland Kitchen BIOTIN 5000 PO Take 1 tablet by mouth daily.    . Calcium Carb-Cholecalciferol (CALCIUM 1000 + D PO) Take 1 tablet by mouth daily.    . calcium-vitamin D (OSCAL WITH D) 500-200 MG-UNIT tablet Take 1 tablet by mouth 3 (three) times daily. 90 tablet 12  . docusate sodium (COLACE) 100 MG capsule Take 1 capsule (100 mg total) by mouth 2 (two) times daily. (Patient not taking: Reported on 11/13/2019) 10 capsule 0  . hydrochlorothiazide (MICROZIDE) 12.5 MG capsule Take 1 capsule (12.5 mg total) by mouth daily. 90 capsule 3  . metoprolol tartrate (LOPRESSOR) 25 MG tablet TAKE 1 TABLET BY MOUTH DAILY. 90 tablet 1  . Multiple Vitamins-Minerals (CENTRUM SILVER ADULT 50+) TABS Take 1 tablet by mouth daily.    Marland Kitchen omega-3 acid ethyl esters (LOVAZA) 1 g capsule Take 1 g by mouth daily.     . polyethylene glycol (MIRALAX / GLYCOLAX) packet Take 17 g by mouth daily as needed for mild constipation. (Patient not taking: Reported on 11/13/2019) 14 each 0  . sertraline (ZOLOFT) 50 MG tablet TAKE 1 TABLET BY MOUTH DAILY. 90 tablet 1  . vitamin B-12 (CYANOCOBALAMIN) 1000 MCG tablet Take  1,000 mcg by mouth daily.    Marland Kitchen warfarin (COUMADIN) 5 MG tablet TAKE UP TO 2 TABLETS DAILY OR AS DIRECTED. 90 tablet 1  . zinc sulfate 220 (50 Zn) MG capsule Take 1 capsule (220 mg total) by mouth daily. 42 capsule 0   No current facility-administered medications for this visit.    SURGICAL HISTORY:  Past Surgical History:  Procedure Laterality Date  . APPENDECTOMY    . BREAST LUMPECTOMY WITH RADIOACTIVE SEED LOCALIZATION Right 02/08/2016   Procedure: RIGHT BREAST LUMPECTOMY WITH RADIOACTIVE SEED LOCALIZATION;  Surgeon: Fanny Skates, MD;  Location: [facility] [place];  Service: General;  Laterality: Right;  . ORIF FEMUR FRACTURE Left 04/21/2018   Procedure: OPEN REDUCTION INTERNAL FIXATION (ORIF) PERI PROSTHETIC FEMUR FRACTURE;  Surgeon: Leandrew Koyanagi, MD;  Location: [place];  Service: Orthopedics;  Laterality: Left;  . TONSILLECTOMY    . TOTAL HIP ARTHROPLASTY  1999, left hip    2006 right hip  . TUBAL LIGATION  REVIEW OF SYSTEMS:   Review of Systems  Constitutional: Negative for appetite change, chills, fatigue, fever and unexpected weight change.  HENT: Negative for mouth sores, nosebleeds, sore throat and trouble swallowing.   Eyes: Negative for eye problems and icterus.  Respiratory: Negative for cough, hemoptysis, shortness of breath and wheezing.   Cardiovascular: Negative for chest pain and leg swelling.  Gastrointestinal: Negative for abdominal pain, constipation, diarrhea, nausea and vomiting.  Genitourinary: Negative for bladder incontinence, difficulty urinating, dysuria, frequency and hematuria.   Musculoskeletal: Positive for chronic low back pain and mild cramping in her hands. Negative for gait problem, neck pain and neck stiffness.  Skin: Negative for itching and rash.  Neurological: Positive for history of falls. Negative for dizziness, extremity weakness, gait problem, headaches, light-headedness and seizures.  Hematological: Negative for adenopathy. Does  not bruise/bleed easily.  Psychiatric/Behavioral: Negative for confusion, depression and sleep disturbance. The patient is not nervous/anxious.     PHYSICAL EXAMINATION:  Blood pressure (!) 141/58, pulse (!) 55, temperature 97.8 F (36.6 C), temperature source Temporal, resp. rate 20, height 5' 4" (1.626 m), weight 154 lb 12.8 oz (70.2 kg), SpO2 96 %.  ECOG PERFORMANCE STATUS: 1 - Symptomatic but completely ambulatory  Physical Exam  Constitutional: Oriented to person, place, and time and well-developed, well-nourished, and in no distress.  HENT:  Head: Normocephalic and atraumatic.  Mouth/Throat: Oropharynx is clear and moist. No oropharyngeal exudate.  Eyes: Conjunctivae are normal. Right eye exhibits no discharge. Left eye exhibits no discharge. No scleral icterus.  Neck: Normal range of motion. Neck supple.  Cardiovascular: Normal rate, regular rhythm, normal heart sounds and intact distal pulses.   Pulmonary/Chest: Effort normal and breath sounds normal. No respiratory distress. No wheezes. No rales.  Abdominal: Soft. Bowel sounds are normal. Exhibits no distension and no mass. There is no tenderness.  Musculoskeletal: Normal range of motion. Exhibits no edema.  Lymphadenopathy:    No cervical adenopathy.  Neurological: Alert and oriented to person, place, and time. Exhibits normal muscle tone. Gait normal. Coordination normal.  Skin: Skin is warm and dry. No rash noted. Not diaphoretic. No erythema. No pallor.  Psychiatric: Mood, memory and judgment normal.  Vitals reviewed. Breast Exam: Chaperon present Teresa Pina CNA). S/p right lumpectomy: Surgical incision healed well. No palpable mass, nodules or adenopathy bilaterally. Breast exam benign.   LABORATORY DATA: Lab Results  Component Value Date   WBC 11.5 (H) 11/13/2019   HGB 15.9 (H) 11/13/2019   HCT 49.0 (H) 11/13/2019   MCV 93.9 11/13/2019   PLT 216 11/13/2019      Chemistry      Component Value Date/Time   NA 145  11/13/2019 0958   NA 142 04/19/2017 1242   K 4.5 11/13/2019 0958   K 3.9 04/19/2017 1242   CL 105 11/13/2019 0958   CO2 30 11/13/2019 0958   CO2 27 04/19/2017 1242   BUN 21 11/13/2019 0958   BUN 17.4 04/19/2017 1242   CREATININE 0.82 11/13/2019 0958   CREATININE 0.7 04/19/2017 1242      Component Value Date/Time   CALCIUM 10.4 (H) 11/13/2019 0958   CALCIUM 9.7 04/19/2017 1242   ALKPHOS 254 (H) 11/13/2019 0958   ALKPHOS 198 (H) 04/19/2017 1242   AST 23 11/13/2019 0958   AST 18 04/19/2017 1242   ALT 15 11/13/2019 0958   ALT 17 04/19/2017 1242   BILITOT 0.4 11/13/2019 0958   BILITOT 0.49 04/19/2017 1242       RADIOGRAPHIC STUDIES:  XR  C-ARM NO REPORT  Result Date: 11/09/2019 Please see Notes tab for imaging impression.    ASSESSMENT/PLAN:  KAMEKA WHAN is a 86 y.o. female with   1. Breast cancer of upper-outer quadrant of right breast, invasive ductal carcinoma, pT1CN0M0, stage IA, ER+/PR+/HER2- -She was diagnosed in 01/2016. She is s/p right breastlumpectomy. -Giving the low Ki-67 and agree 1 disease, Dr. Burr Medico thinks this is likely a low risk cancer. She does not recommend adjuvant chemotherapy or Oncotype. -She started antiestrogen with Anastrozole in 02/2016. Tolerating moderately well witharthralgia. Willcontinue for 5 years total. -she has had multiple falls and fractures in the past few years,  Dr. Burr Medico discussed the option of bisphosphonates in the past.  -I discussed bisphosphonates again with her today (11/13/19). See #6 below.  -She is reportedly already scheduled for a mammogram in August 2021.  -will continue anastrozole for now, plan to stop in 12/2020, or sooner if needed  -She is clinically doing well, no concern for recurrence -Follow-up in 6 months  2. GeneticTesting was negative  3. HTN, AFib  -She was on hydrochlorothiazide for hypertension before, which was held when she had a 2 fibrillation. She is on low-dose metoprolol. -Given her Afib  episode on 06/28/17, she was placed on Lopressor 12.77m BID and Eliquis 5 mg BID  4. Arthralgia, Joint Stiffness -Chronic left hip pain.Has lower back pain.Follows with orthopedics.She ambulates with cane. -Joint pain has slightly increased with anastrozolewith hands stiffness. Manageable. -Taking meloxicam. Stable.   6. Bone health, 2019 left femur fracture, multiple fractures in 04/2019  -Dr. FBurr Medicopreviously spoke with the patient about the risk for decreased strength in bones with anastrozole -Hadfall in 04/2018 that resulted in left femur fracture. She was treated with surgery. She recovered well. -01/22/17 DEXA was normal.. -Continue Calcium and Vit D.  -She had a fall in 04/2019 with stage T12-L1 compression fracture  -At her visit in February 2021, Dr. FBurr Medicorecommend patient to start oral biphosphonate due to her multiple fractures.  Potential benefit and side effects discussed with her, she does have a loose tooth, Dr. FBurr Medico recommend her to see her dentist in the next few months and to get a clearance for oral biphosphonate -Today, (11/13/2019), discussed bisphosphonate again. The patient did not talk to her dentist about this yet; however, she states she is not interested in starting this medication. I re-discussed bisphosphonates with the patient today. I provided her a handout of this medication to review as well. Discussed the role of the medication, associated morbidity and mortality with falls/fractures (especially due to her frequent falls), side effects, and dosing options (daily vs weekly dose). She is not interested in this medication at this time. Instructed her to call [place] her [place] after reviewing her AVS/education material. I would be happy to prescribe it if she changes her mind.  -[facility]-Clinically doing well -Continue anastrozole -Lab and f/u in642month-Given patient  education on AVS for fosamax.  -Mammogram and DEXA scan August 2021.    Orders Placed This Encounter  Procedures  . DG Bone Density    [facility]  Standing Status:   Future    Standing Expiration Date:   11/12/2020    Scheduling Instructions:     Schedule around when she is getting her mammogram in August 2021 at  [facility]    Order Specific Question:   Reason for Exam (SYMPTOM  OR DIAGNOSIS REQUIRED)    Answer:   Anastrozole use, hx fracture x2,    Order Specific Question:   Preferred imaging location?    Answer:   External    Comments:   [place], PA-C 11/13/19

## 2019-11-13 ENCOUNTER — Other Ambulatory Visit: Payer: Self-pay

## 2019-11-13 ENCOUNTER — Telehealth: Payer: Self-pay

## 2019-11-13 ENCOUNTER — Inpatient Hospital Stay: Payer: Medicare Other | Attending: Physician Assistant | Admitting: Physician Assistant

## 2019-11-13 ENCOUNTER — Inpatient Hospital Stay: Payer: Medicare Other

## 2019-11-13 VITALS — BP 141/58 | HR 55 | Temp 97.8°F | Resp 20 | Ht 64.0 in | Wt 154.8 lb

## 2019-11-13 DIAGNOSIS — C50411 Malignant neoplasm of upper-outer quadrant of right female breast: Secondary | ICD-10-CM

## 2019-11-13 DIAGNOSIS — Z170 Estrogen receptor positive status [ER+]: Secondary | ICD-10-CM

## 2019-11-13 DIAGNOSIS — Z7901 Long term (current) use of anticoagulants: Secondary | ICD-10-CM | POA: Insufficient documentation

## 2019-11-13 DIAGNOSIS — R296 Repeated falls: Secondary | ICD-10-CM | POA: Diagnosis not present

## 2019-11-13 DIAGNOSIS — G8929 Other chronic pain: Secondary | ICD-10-CM | POA: Insufficient documentation

## 2019-11-13 DIAGNOSIS — I4891 Unspecified atrial fibrillation: Secondary | ICD-10-CM | POA: Insufficient documentation

## 2019-11-13 DIAGNOSIS — Z803 Family history of malignant neoplasm of breast: Secondary | ICD-10-CM | POA: Diagnosis not present

## 2019-11-13 DIAGNOSIS — M1990 Unspecified osteoarthritis, unspecified site: Secondary | ICD-10-CM | POA: Insufficient documentation

## 2019-11-13 DIAGNOSIS — I10 Essential (primary) hypertension: Secondary | ICD-10-CM | POA: Insufficient documentation

## 2019-11-13 DIAGNOSIS — M545 Low back pain: Secondary | ICD-10-CM | POA: Insufficient documentation

## 2019-11-13 DIAGNOSIS — Z79811 Long term (current) use of aromatase inhibitors: Secondary | ICD-10-CM | POA: Insufficient documentation

## 2019-11-13 LAB — CMP (CANCER CENTER ONLY)
ALT: 15 U/L (ref 0–44)
AST: 23 U/L (ref 15–41)
Albumin: 4 g/dL (ref 3.5–5.0)
Alkaline Phosphatase: 254 U/L — ABNORMAL HIGH (ref 38–126)
Anion gap: 10 (ref 5–15)
BUN: 21 mg/dL (ref 8–23)
CO2: 30 mmol/L (ref 22–32)
Calcium: 10.4 mg/dL — ABNORMAL HIGH (ref 8.9–10.3)
Chloride: 105 mmol/L (ref 98–111)
Creatinine: 0.82 mg/dL (ref 0.44–1.00)
GFR, Est AFR Am: >60 mL/min
GFR, Estimated: >60 mL/min
Glucose, Bld: 97 mg/dL (ref 70–99)
Potassium: 4.5 mmol/L (ref 3.5–5.1)
Sodium: 145 mmol/L (ref 135–145)
Total Bilirubin: 0.4 mg/dL (ref 0.3–1.2)
Total Protein: 7.5 g/dL (ref 6.5–8.1)

## 2019-11-13 LAB — CBC WITH DIFFERENTIAL (CANCER CENTER ONLY)
Abs Immature Granulocytes: 0.05 10*3/uL (ref 0.00–0.07)
Basophils Absolute: 0 10*3/uL (ref 0.0–0.1)
Basophils Relative: 0 %
Eosinophils Absolute: 0.1 10*3/uL (ref 0.0–0.5)
Eosinophils Relative: 1 %
HCT: 49 % — ABNORMAL HIGH (ref 36.0–46.0)
Hemoglobin: 15.9 g/dL — ABNORMAL HIGH (ref 12.0–15.0)
Immature Granulocytes: 0 %
Lymphocytes Relative: 20 %
Lymphs Abs: 2.3 10*3/uL (ref 0.7–4.0)
MCH: 30.5 pg (ref 26.0–34.0)
MCHC: 32.4 g/dL (ref 30.0–36.0)
MCV: 93.9 fL (ref 80.0–100.0)
Monocytes Absolute: 1 10*3/uL (ref 0.1–1.0)
Monocytes Relative: 9 %
Neutro Abs: 8.1 10*3/uL — ABNORMAL HIGH (ref 1.7–7.7)
Neutrophils Relative %: 70 %
Platelet Count: 216 10*3/uL (ref 150–400)
RBC: 5.22 MIL/uL — ABNORMAL HIGH (ref 3.87–5.11)
RDW: 13.2 % (ref 11.5–15.5)
WBC Count: 11.5 10*3/uL — ABNORMAL HIGH (ref 4.0–10.5)
nRBC: 0 % (ref 0.0–0.2)

## 2019-11-13 NOTE — Telephone Encounter (Signed)
Order for DEXA scan faxed to [facility] at [phone]. Confirmation of fax received.

## 2019-11-13 NOTE — Patient Instructions (Signed)
Alendronate oral solution What is this medicine? ALENDRONATE (a LEN droe nate) slows calcium loss from bones. It helps to make normal healthy bone and to slow bone loss in people with Paget's disease and osteoporosis. It may be used in others at risk for bone loss. This medicine may be used for other purposes; ask your health care provider or pharmacist if you have questions. COMMON BRAND NAME(S): Fosamax What should I tell my health care provider before I take this medicine? They need to know if you have any of these conditions:  esophagus, stomach, or intestine problems, like acid reflux or GERD  dental disease  kidney disease  low blood calcium  low vitamin D  problems swallowing  problems sitting or standing for 30 minutes  an unusual or allergic reaction to alendronate, other medicines, foods, dyes, or preservatives  pregnant or trying to get pregnant  breast-feeding How should I use this medicine? You must take this medicine exactly as directed or you will lower the amount of the medicine you absorb into your body or you may cause yourself harm. Take your dose by mouth first thing in the morning, after you are up for the day. Do not eat or drink anything before you take this solution. Use a specially marked spoon or container to measure the oral solution. Take the solution with 2 fluid ounces (1/4 cup) of plain water. Do not take the solution with any other drink. After taking this medicine do not eat breakfast, drink, or take any other medicines or vitamins for at least 30 minutes. Stand or sit up for at least 30 minutes after you take this medicine; do not lie down. Do not take your medicine more often than directed. Take this medicine on the same day every week. Talk to your pediatrician regarding the use of this medicine in children. Special care may be needed. Overdosage: If you think you have taken too much of this medicine contact a poison control center or emergency room  at once. NOTE: This medicine is only for you. Do not share this medicine with others. What if I miss a dose? If you miss a dose, take the dose on the morning after you remember. Then take your next dose on your regular day of the week. Never take 2 doses on the same day. Do not take double or extra doses. What may interact with this medicine?  aluminum hydroxide  antacids  aspirin  calcium supplements  drugs for inflammation like ibuprofen, naproxen, and others  iron supplements  magnesium supplements  vitamins with minerals This list may not describe all possible interactions. Give your health care provider a list of all the medicines, herbs, non-prescription drugs, or dietary supplements you use. Also tell them if you smoke, drink alcohol, or use illegal drugs. Some items may interact with your medicine. What should I watch for while using this medicine? Visit your doctor for regular check ups. It may be some time before you see benefit from this medicine. Do not stop taking your medicine unless your doctor tells you to. Your doctor or health care professional may order regular blood tests to see how you are doing. You should make sure you get enough calcium and vitamin D in your diet while you are taking this medicine, unless your doctor tells you not to. Discuss the foods you eat and the vitamins you take with your health care professional. Some people who take this medicine have severe bone, joint, and/or muscle pain. This  medicine may also increase your risk for a broken thigh bone. Tell your doctor right away if you have pain in your upper leg or groin. Tell your doctor if you have any pain that does not go away or that gets worse. This medicine can make you more sensitive to the sun. If you get a rash while taking this medicine, sunlight may cause the rash to get worse. Keep out of the sun. If you cannot avoid being in the sun, wear protective clothing and use sunscreen. Do not use  sun lamps or tanning beds/booths. What side effects may I notice from receiving this medicine? Side effects that you should report to your doctor or health care professional as soon as possible:  allergic reactions like skin rash, itching or hives, swelling of the face, lips, or tongue  black or tarry stools  bone, muscle, or joint pain  changes in vision  chest pain  heartburn or stomach pain  jaw pain, especially after dental work  redness, blistering, peeling or loosening of the skin, including inside the mouth  trouble or pain when swallowing Side effects that usually do not require medical attention (report to your doctor or health care professional if they continue or are bothersome):  changes in taste  diarrhea or constipation  eye pain or itching  headache  nausea or vomiting  stomach gas or fullness This list may not describe all possible side effects. Call your doctor for medical advice about side effects. You may report side effects to FDA at 1-800-FDA-1088. Where should I keep my medicine? Keep out of the reach of children. Store at room temperature of 15 and 30 degrees C (59 and 86 degrees F). Throw away any unused medicine after the expiration date. NOTE: This sheet is a summary. It may not cover all possible information. If you have questions about this medicine, talk to your doctor, pharmacist, or health care provider.  2020 Elsevier/Gold Standard (2010-11-17 08:54:51)

## 2019-11-16 ENCOUNTER — Telehealth: Payer: Self-pay | Admitting: Physician Assistant

## 2019-11-16 ENCOUNTER — Ambulatory Visit (INDEPENDENT_AMBULATORY_CARE_PROVIDER_SITE_OTHER): Payer: Medicare Other | Admitting: Pharmacist

## 2019-11-16 ENCOUNTER — Other Ambulatory Visit: Payer: Self-pay

## 2019-11-16 DIAGNOSIS — I4891 Unspecified atrial fibrillation: Secondary | ICD-10-CM | POA: Diagnosis not present

## 2019-11-16 DIAGNOSIS — Z7901 Long term (current) use of anticoagulants: Secondary | ICD-10-CM

## 2019-11-16 LAB — POCT INR: INR: 2.5 (ref 2.0–3.0)

## 2019-11-16 NOTE — Telephone Encounter (Signed)
Scheduled per los. Called and left msg. Mailed printout  °

## 2019-11-26 ENCOUNTER — Other Ambulatory Visit: Payer: Self-pay | Admitting: Cardiology

## 2019-12-02 ENCOUNTER — Encounter: Payer: Self-pay | Admitting: Orthopaedic Surgery

## 2019-12-02 ENCOUNTER — Ambulatory Visit: Payer: Self-pay

## 2019-12-02 ENCOUNTER — Ambulatory Visit: Payer: Medicare Other | Admitting: Orthopaedic Surgery

## 2019-12-02 DIAGNOSIS — M79652 Pain in left thigh: Secondary | ICD-10-CM

## 2019-12-02 DIAGNOSIS — T8484XA Pain due to internal orthopedic prosthetic devices, implants and grafts, initial encounter: Secondary | ICD-10-CM

## 2019-12-02 MED ORDER — METHYLPREDNISOLONE 4 MG PO TBPK: ORAL_TABLET | ORAL | 0 refills | Status: DC

## 2019-12-02 NOTE — Progress Notes (Signed)
Office Visit Note   Patient: Teresa Frey           Date of Birth: 1930/05/31           MRN: [id number] Visit Date: 12/02/2019              Requested by: [name], [name], MD [address] [place],  [place] [address] PCP: [name], [name], MD   Assessment & Plan: Visit Diagnoses:  1. Left thigh pain   2. Painful orthopaedic hardware [facility])     Plan: My impression is hardware irritation of the IT band.  I recommend Medrol Dosepak and some activity modification and rest.  Follow-up as needed.  Follow-Up Instructions: Return if symptoms worsen or fail to improve.   Orders:  Orders Placed This Encounter  Procedures  . XR FEMUR MIN 2 VIEWS LEFT   Meds ordered this encounter  Medications  . methylPREDNISolone (MEDROL DOSEPAK) 4 MG TBPK tablet    Sig: Use as directed    Dispense:  21 tablet    Refill:  0      Procedures: No procedures performed   Clinical Data: No additional findings.   Subjective: Chief Complaint  Patient presents with  . Left Thigh - Pain    Teresa Frey comes in today for evaluation of 2 to 3 weeks of left lateral thigh to knee pain.  Denies any injuries.  Ambulating with a cane.  She feels that stabbing pain on the lateral side underneath the scar is worse with walking.  She has taken Tylenol without significant relief.   Review of Systems  Constitutional: Negative.   HENT: Negative.   Eyes: Negative.   Respiratory: Negative.   Cardiovascular: Negative.   Endocrine: Negative.   Musculoskeletal: Negative.   Neurological: Negative.   Hematological: Negative.   Psychiatric/Behavioral: Negative.   All other systems reviewed and are negative.    Objective: Vital Signs: There were no vitals taken for this visit.  Physical Exam Vitals and nursing note reviewed.  Constitutional:      Appearance: She is well-developed.  Pulmonary:     Effort: Pulmonary effort is normal.  Skin:    General: Skin is warm.     Capillary Refill: Capillary  refill takes less than 2 seconds.  Neurological:     Mental Status: She is alert and oriented to person, place, and time.  Psychiatric:        Behavior: Behavior normal.        Thought Content: Thought content normal.        Judgment: Judgment normal.     Ortho Exam Fully healed surgical scar.  No signs of infection.  No soft tissue crepitus.  She is tender over the surgical scar and worse with knee range of motion.  I do not feel any snapping of the IT band. Specialty Comments:  No specialty comments available.  Imaging: XR FEMUR MIN 2 VIEWS LEFT  Result Date: 12/02/2019 No acute or structural abnormalities.  No hardware complications.  Fully healed periprosthetic femur fracture    PMFS History: Patient Active Problem List   Diagnosis Date Noted  . Multiple falls 11/13/2019  . COPD (chronic obstructive pulmonary disease) ([place]) 09/21/2019  . Educated about COVID-19 virus infection 09/09/2019  . Spondylosis without myelopathy or radiculopathy, lumbar region 02/12/2019  . Spondylolisthesis of lumbar region 02/12/2019  . Pain of left hip joint 12/23/2018  . Primary osteoarthritis of left knee 12/23/2018  . Periprosthetic fracture around internal prosthetic left  hip joint ([place]) 04/19/2018  . Long term (current) use of anticoagulants [Z79.01] 08/14/2017  . Genetic testing 04/01/2016  . possible sleep apnea by history 02/12/2016  . A-fib ([place]) 02/11/2016  . Surgical wound infection 02/11/2016  . Breast cancer of upper-outer quadrant of right female breast ([place]) 01/11/2016  . Impaired glucose tolerance 07/28/2014  . Anxiety disorder, unspecified 04/07/2007  . Osteoarthritis 04/07/2007  . Essential hypertension 04/02/2007  . History of colonic polyps 04/02/2007  . DIVERTICULITIS, HX OF 04/02/2007   Past Medical History:  Diagnosis Date  . ANXIETY 04/07/2007  . Breast cancer of upper-outer quadrant of right female breast ([place]) 01/11/2016  . COLONIC POLYPS, HX OF 04/02/2007    adenomatous  . DIVERTICULITIS, HX OF 04/02/2007  . Diverticulosis   . DJD (degenerative joint disease)   . Hemorrhoids   . HYPERTENSION 04/02/2007  . OSTEOARTHRITIS, SEVERE 04/07/2007  . 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 WEIGHT GAIN 11/13/2007    Family History  Problem Relation Age of Onset  . Pancreatic cancer Mother 78       d. 40  . Cancer Father 21       hodgkin's lymphoma and leukemia; d. 68  . Colon cancer Brother        dx. late 62s; smoker  . Pancreatic cancer Brother 80       d. 3; smoker  . Lung cancer Brother 40       smoker  . Heart attack Brother        d. late 42s  . Colon cancer Paternal Aunt   . Breast cancer Daughter 60       negative genetic testing in 2016  . Skin cancer Daughter        non-melanoma type; +sun exposure  . Kidney failure Son 63       +EtOH abuse resulting in liver, kidney, and pancreas failure  . Diabetes Maternal Uncle        d. later age  . Heart disease Neg Hx        family  . Esophageal cancer Neg Hx   . Rectal cancer Neg Hx   . Stomach cancer Neg Hx     Past Surgical History:  Procedure Laterality Date  . APPENDECTOMY    . BREAST LUMPECTOMY WITH RADIOACTIVE SEED LOCALIZATION Right 02/08/2016   Procedure: RIGHT BREAST LUMPECTOMY WITH RADIOACTIVE SEED LOCALIZATION;  Surgeon: Fanny Skates, MD;  Location: [facility];  Service: General;  Laterality: Right;  . ORIF FEMUR FRACTURE Left 04/21/2018   Procedure: OPEN REDUCTION INTERNAL FIXATION (ORIF) PERI PROSTHETIC FEMUR FRACTURE;  Surgeon: Leandrew Koyanagi, MD;  Location: [facility];  Service: Orthopedics;  Laterality: Left;  . TONSILLECTOMY    . TOTAL HIP ARTHROPLASTY  1999, left hip    2006 right hip  . TUBAL LIGATION     Social History   Occupational History  . Not on file  Tobacco Use  . Smoking status: Former Smoker    Packs/day: 1.00    Years: 27.00    Pack years: 27.00    Types: Cigarettes    Quit date: 06/04/1978    Years since quitting: 41.5    . Smokeless tobacco: Never Used  Vaping Use  . Vaping Use: Never used  Substance and Sexual Activity  . Alcohol use: Yes    Alcohol/week: 7.0 standard drinks    Types: 7 Glasses of wine per week    Comment: 1-2 glasses wine  after dinner, but not necessarily each day  . Drug use: No  . Sexual activity: Not on file

## 2019-12-30 ENCOUNTER — Ambulatory Visit (INDEPENDENT_AMBULATORY_CARE_PROVIDER_SITE_OTHER): Payer: Medicare Other

## 2019-12-30 ENCOUNTER — Other Ambulatory Visit: Payer: Self-pay

## 2019-12-30 DIAGNOSIS — I4891 Unspecified atrial fibrillation: Secondary | ICD-10-CM | POA: Diagnosis not present

## 2019-12-30 DIAGNOSIS — Z7901 Long term (current) use of anticoagulants: Secondary | ICD-10-CM

## 2019-12-30 LAB — POCT INR: INR: 3 (ref 2.0–3.0)

## 2019-12-30 NOTE — Patient Instructions (Signed)
Continue with 2 tablets daily Repeat INR in 6 weeks  

## 2020-01-10 IMAGING — DX DG CHEST 1V PORT
1 series · 1 of 1 positions shown · non-contrast
Comparison: 06/28/2017

CLINICAL DATA: Elevated WBC, shortness of breath

EXAM:
PORTABLE CHEST 1 VIEW

[chest ap]
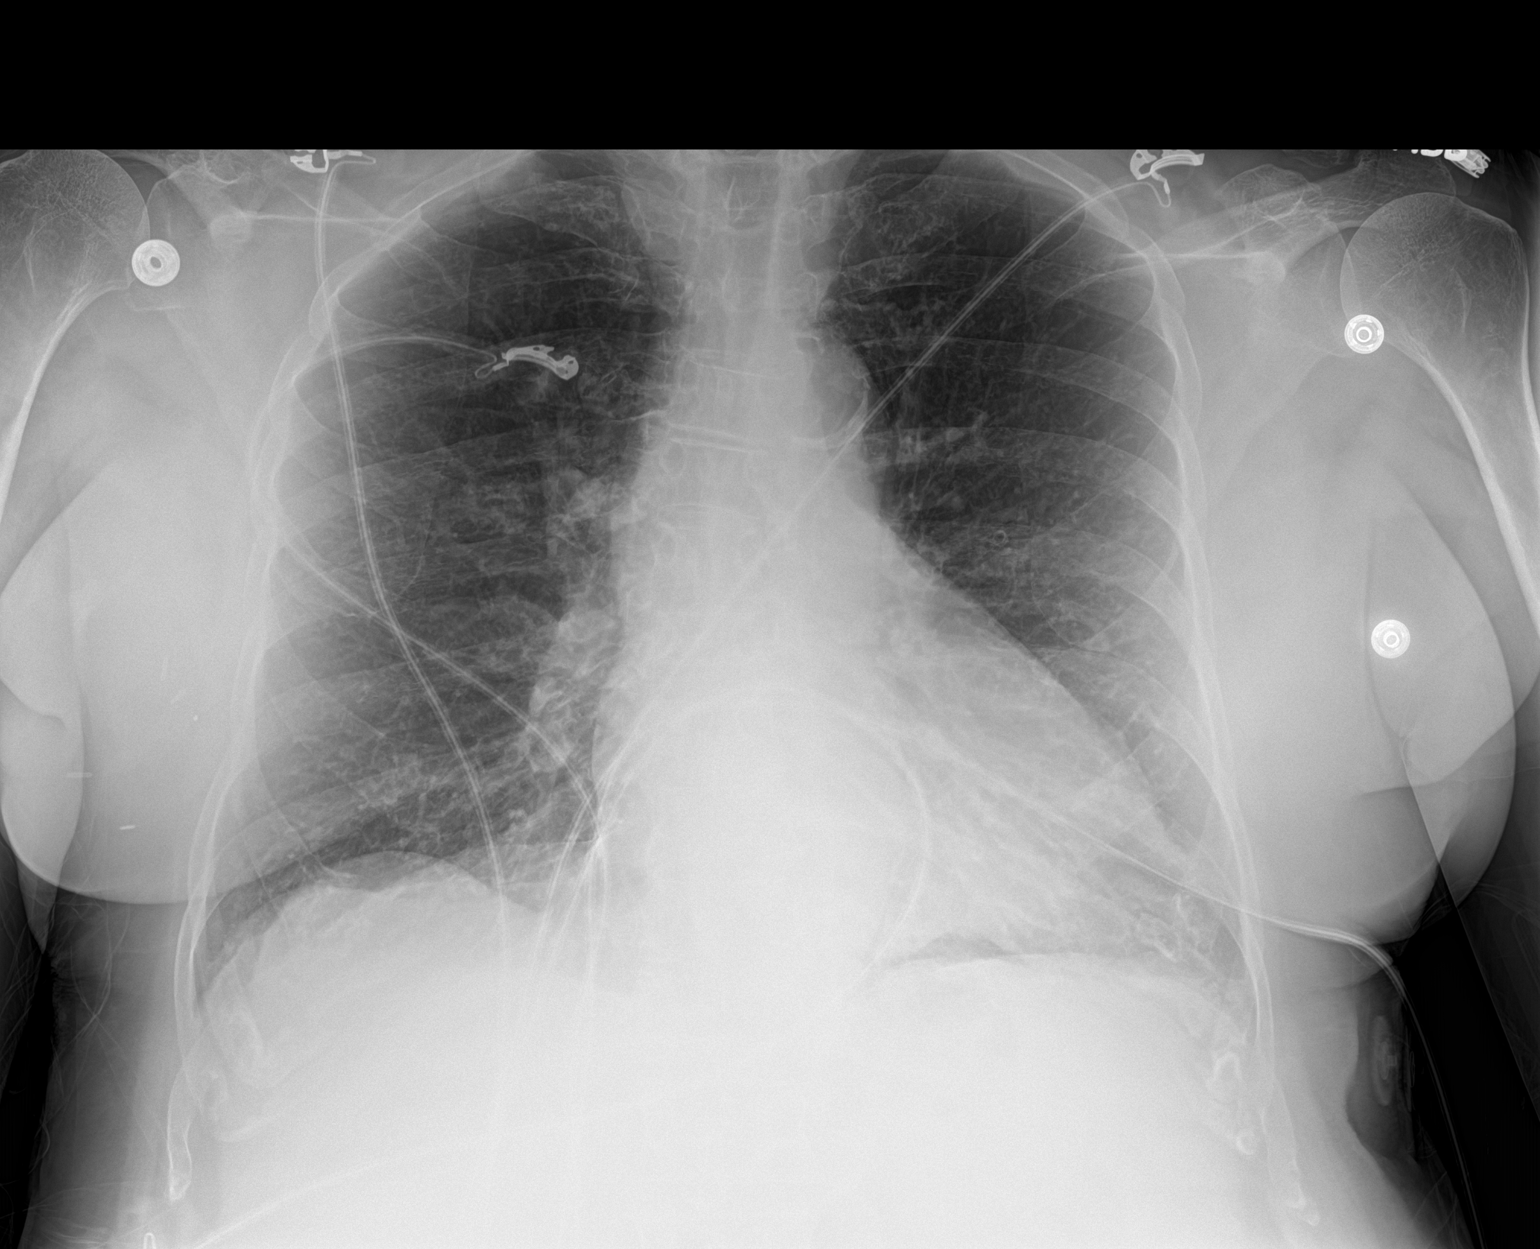

[1 of 1 positions shown; findings below may reference images not displayed]

FINDINGS: There is no focal consolidation. There is chronic bilateral
interstitial thickening. The lungs are hyperinflated likely
secondary to COPD. There is no pleural effusion or pneumothorax.
There is stable cardiomegaly.

The osseous structures are unremarkable.
IMPRESSION: No acute cardiopulmonary disease.

## 2020-01-10 IMAGING — DX LEFT FEMUR PORTABLE 1 VIEW
1 series · 2 of 2 positions shown · non-contrast
Comparison: None.

CLINICAL DATA: Fall with leg pain

EXAM:
LEFT FEMUR PORTABLE 1 VIEW

[Series 1: femur · 0.14mm/px · 2 of 2 slices shown]
[im 1/2]
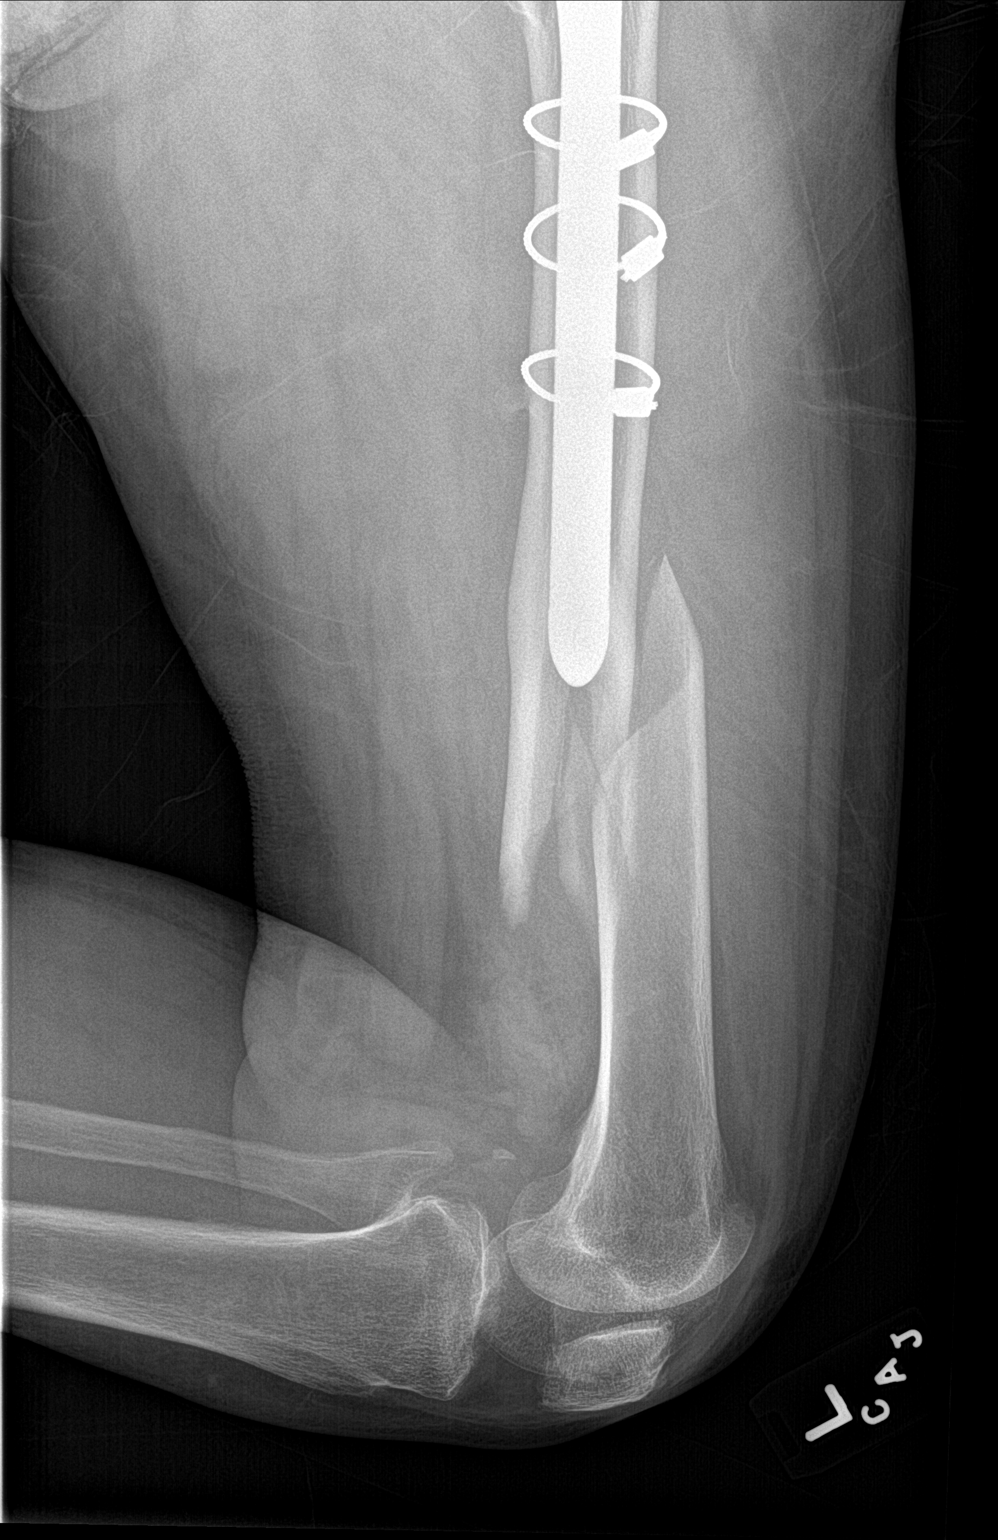
[im 2/2]
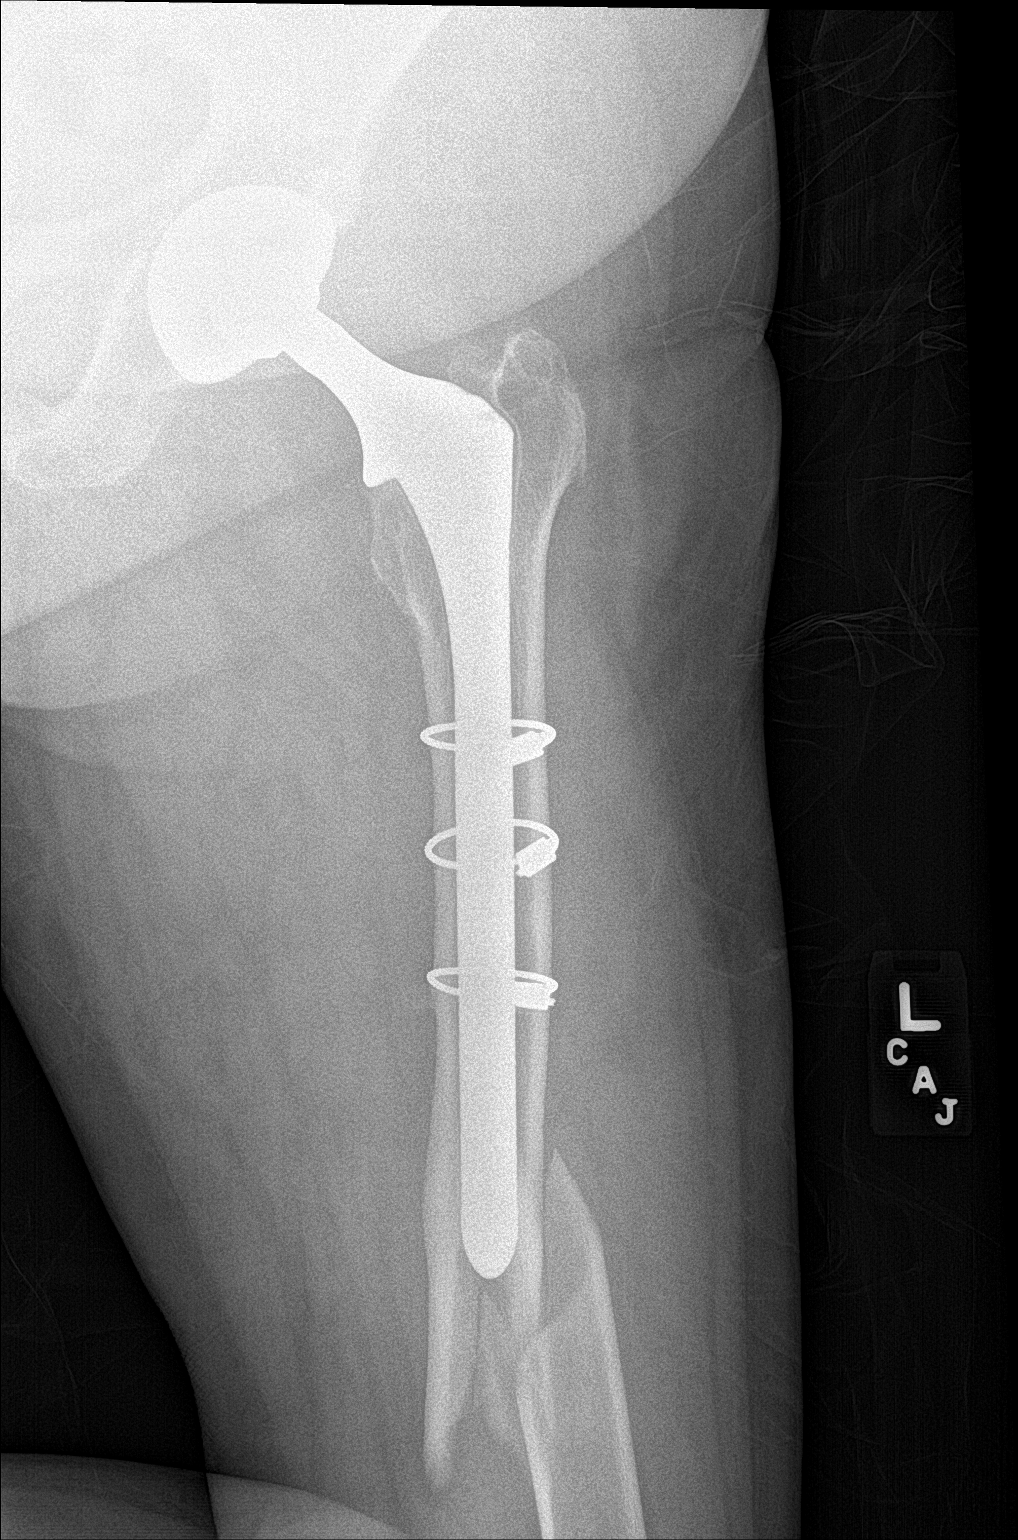

[2 of 2 positions shown; findings below may reference images not displayed]

FINDINGS: Status post left hip replacement. Acute mildly comminuted fracture
at the mid to distal shaft of the femur, just caudal to the tip of
the femoral prosthesis. Close to 1 bone with of anterior and lateral
displacement of dominant fracture fragment.
IMPRESSION: Acute, mildly comminuted and displaced fracture involving the mid to
distal shaft of the left femur, just caudal to the tip of the
femoral prosthesis..

## 2020-01-10 IMAGING — DX DG PORTABLE PELVIS
1 series · 1 of 1 positions shown · non-contrast
Comparison: 03/04/2017

CLINICAL DATA: Fall with left leg pain

EXAM:
PORTABLE PELVIS 1-2 VIEWS

[pelvis]
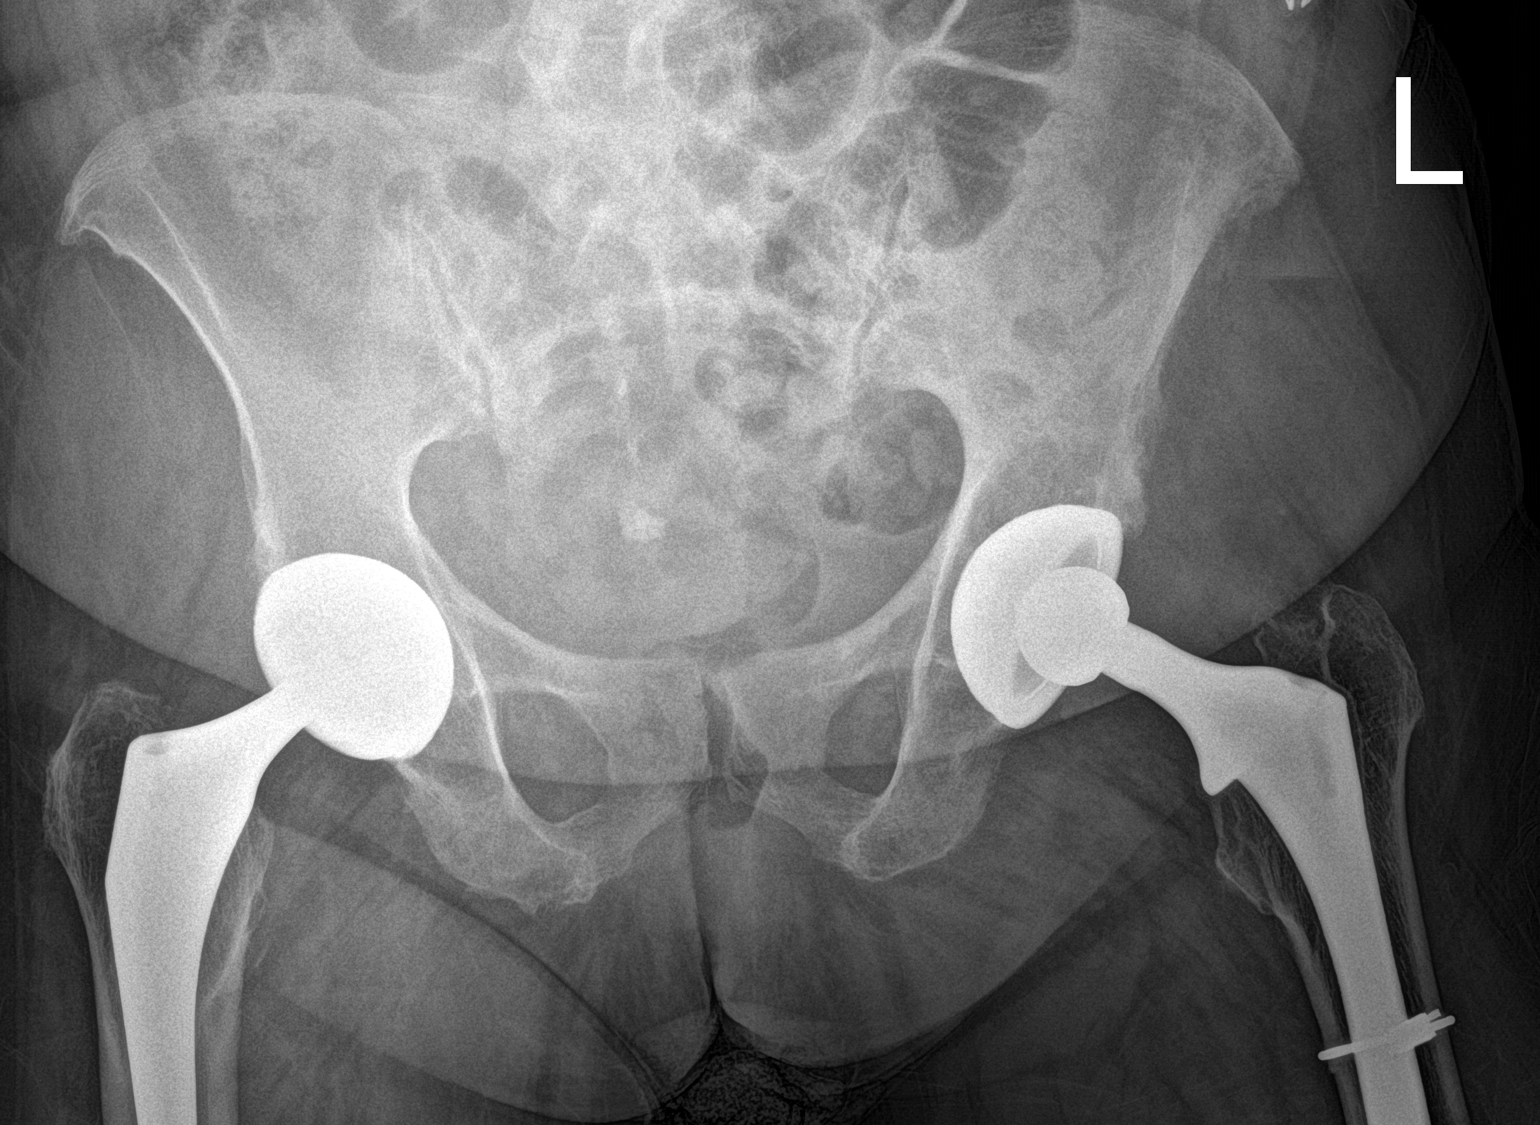

[1 of 1 positions shown; findings below may reference images not displayed]

FINDINGS: Coarse calcification in the pelvis may reflect fibroid. Status post
bilateral hip replacement with normal alignment. Pubic symphysis and
rami appear intact.
IMPRESSION: Status post bilateral hip replacement. No definite acute osseous
abnormality.

## 2020-01-11 ENCOUNTER — Other Ambulatory Visit: Payer: Self-pay | Admitting: Family Medicine

## 2020-01-12 IMAGING — RF DG C-ARM 61-120 MIN
1 series · 10 of 10 positions shown · non-contrast
Comparison: Radiographs April 19, 2018.

CLINICAL DATA: Open reduction and internal fixation of left femur
fracture.

EXAM:
DG C-ARM 61-120 MIN; LEFT FEMUR 2 VIEWS
FLUOROSCOPY TIME:  2 minutes 7 seconds.

[Series 1: run · 10 of 10 slices shown]
[im 1/10]
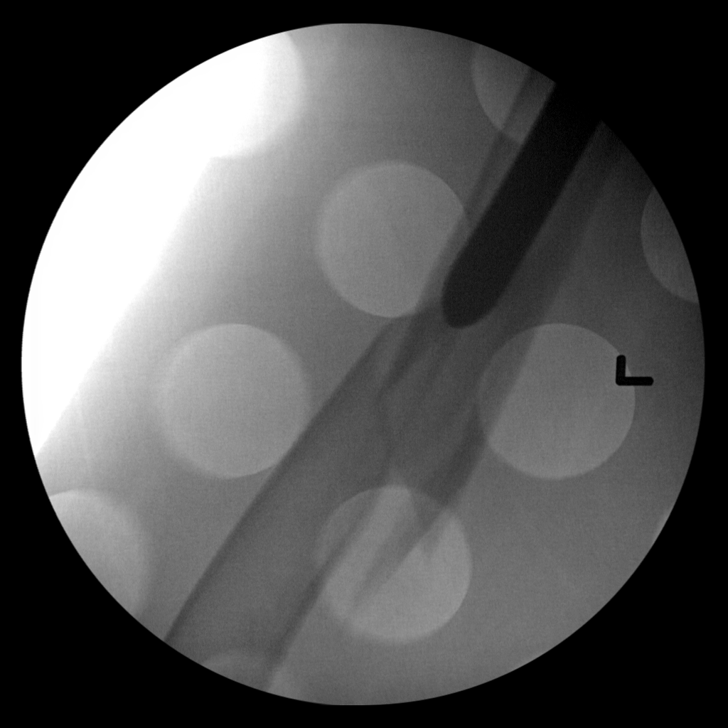
[im 2/10]
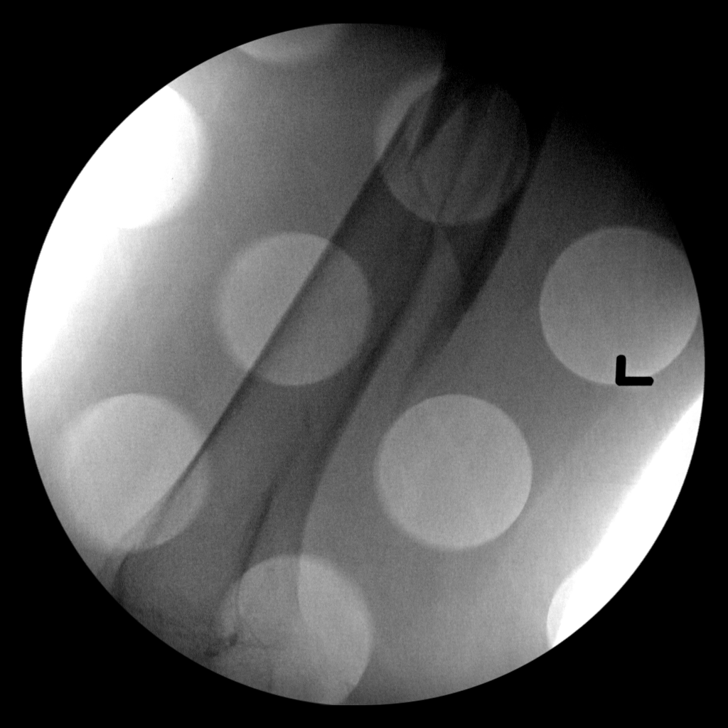
[im 3/10]
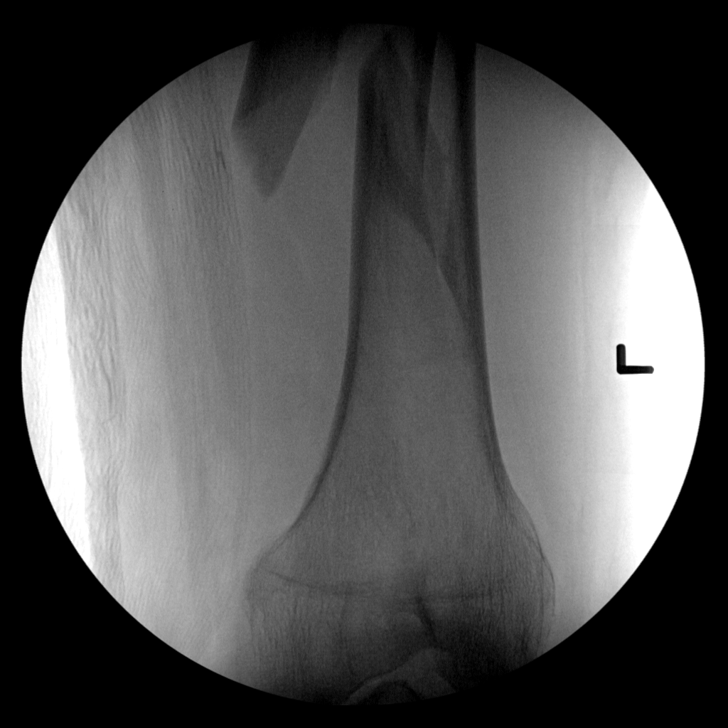
[im 4/10]
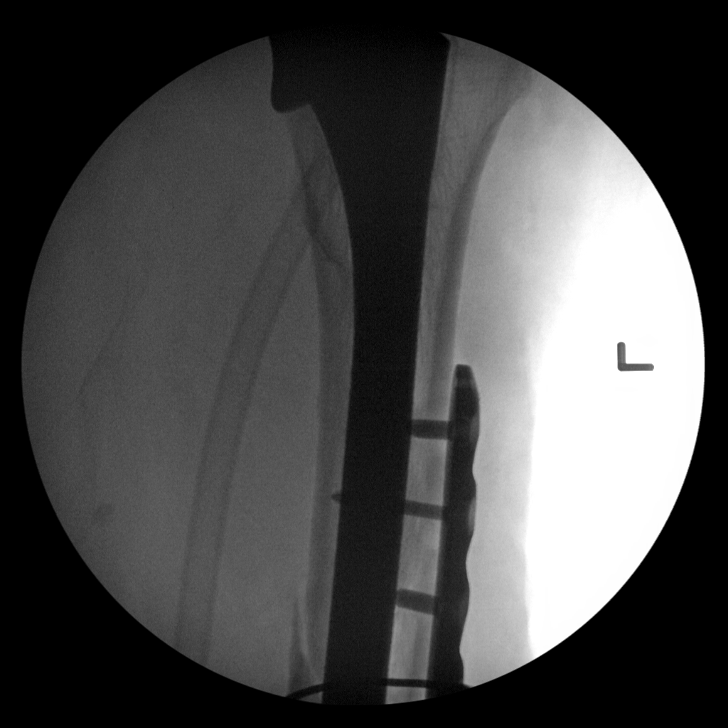
[im 5/10]
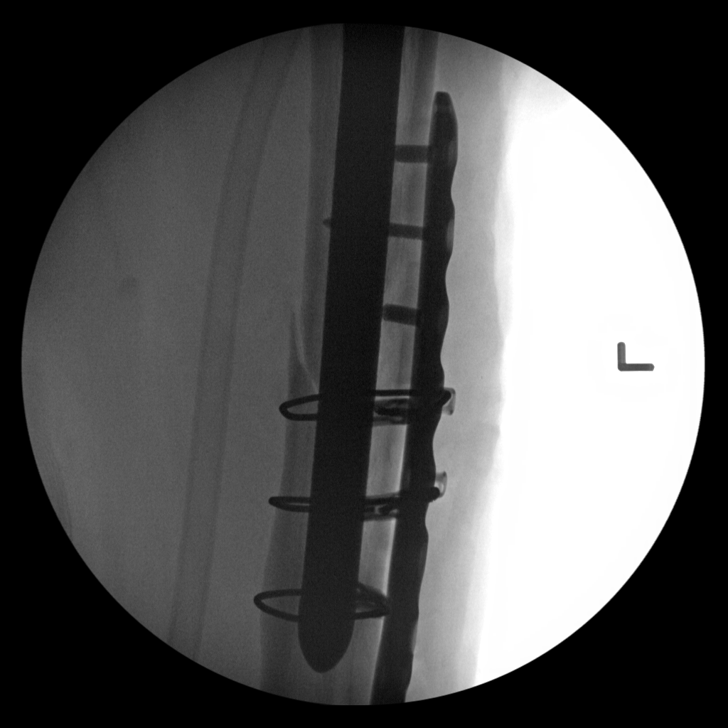
[im 6/10]
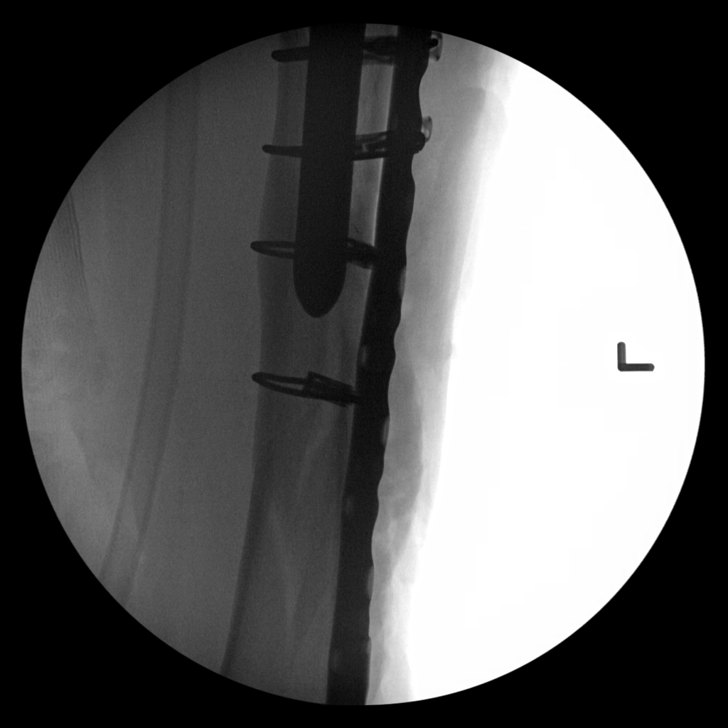
[im 7/10]
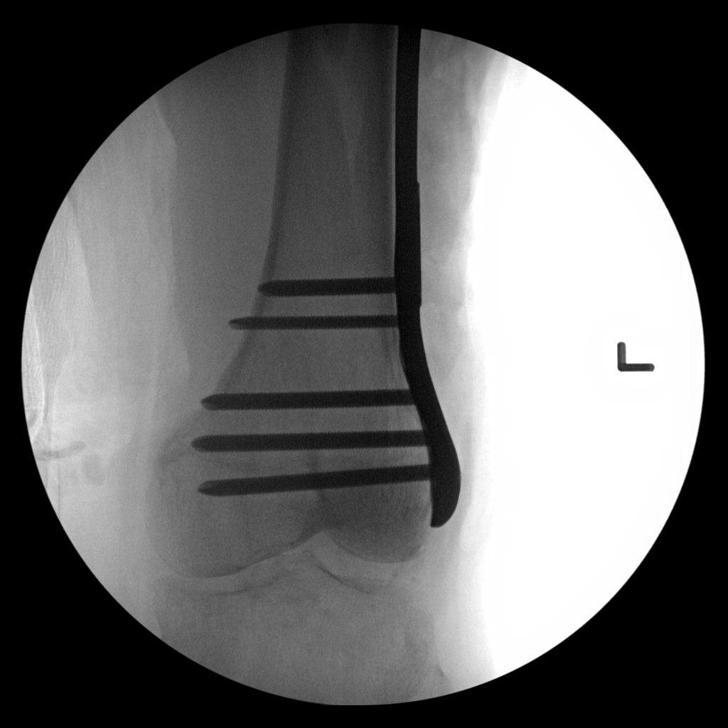
[im 8/10]
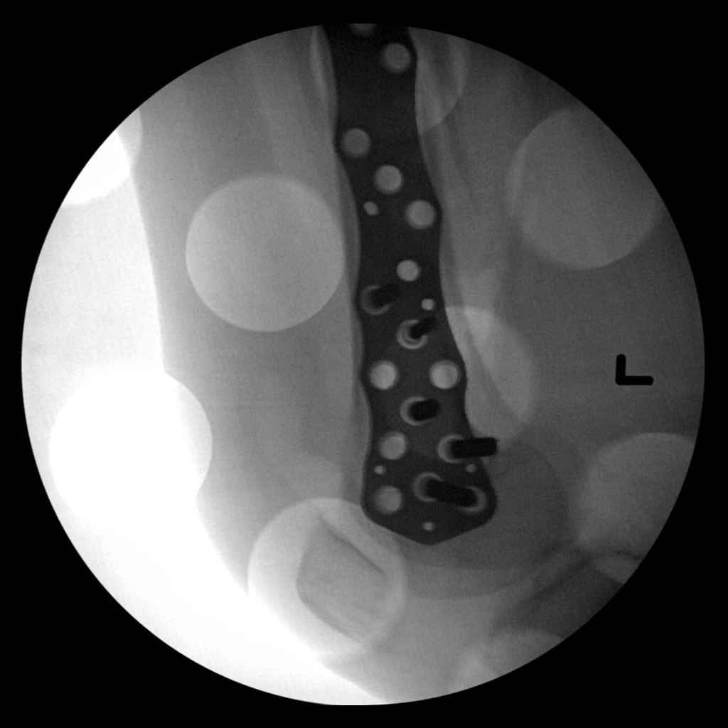
[im 9/10]
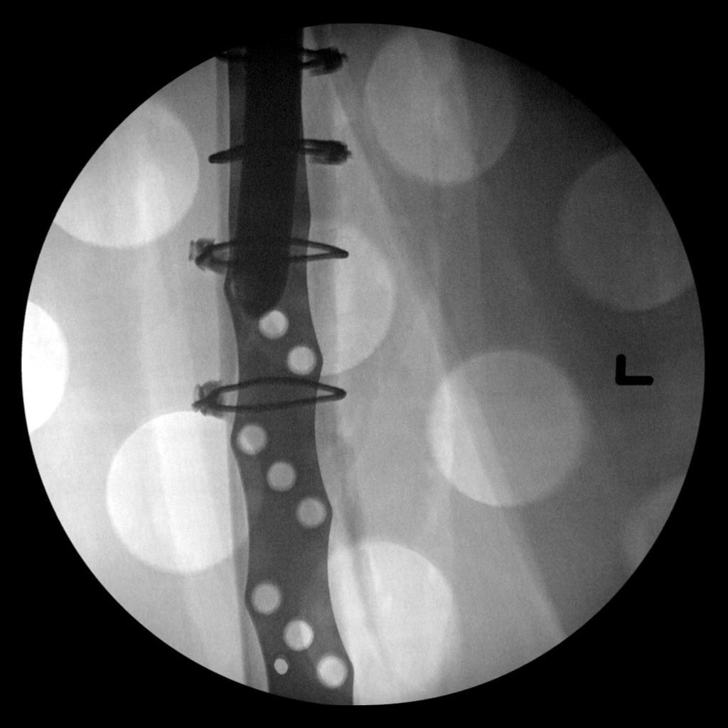
[im 10/10]
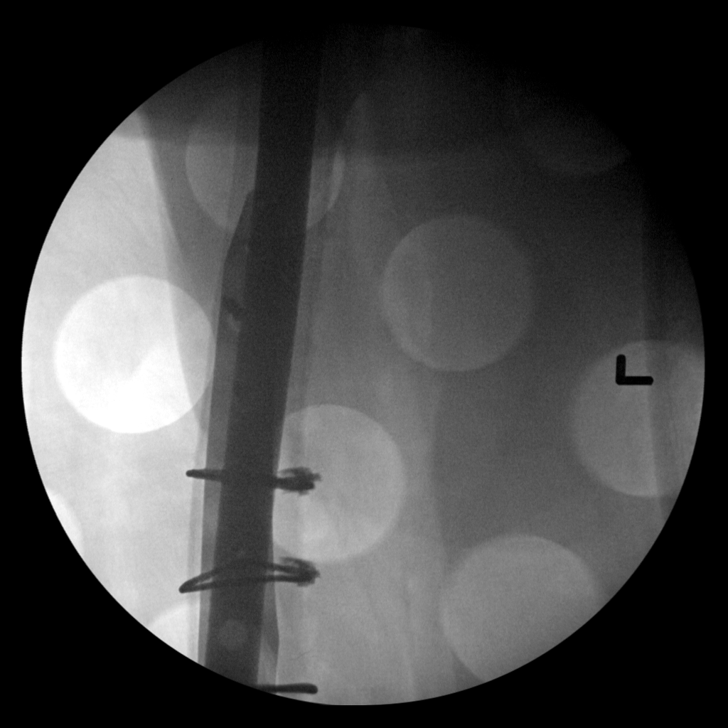

[10 of 10 positions shown; findings below may reference images not displayed]

FINDINGS: Five intraoperative fluoroscopic images of the left femur
demonstrate the patient be status post open reduction and surgical
internal fixation of left periprosthetic fracture. Good alignment of
fracture components is noted.
IMPRESSION: Status post open reduction and internal fixation of left femoral
periprosthetic fracture.

## 2020-01-29 ENCOUNTER — Telehealth: Payer: Self-pay | Admitting: Family Medicine

## 2020-01-29 NOTE — Progress Notes (Signed)
  Chronic Care Management   Note  01/29/2020 Name: Teresa Frey MRN: [id number] DOB: 11/02/1929  Teresa Frey is a 86 y.o. year old female who is a primary care patient of [name], Malka So, MD. I reached out to Teresa Coombs by phone today in response to a referral sent by Ms. Yarely E Doiron's PCP, [name], [name], MD.   [name] was given information about Chronic Care Management services today including:  1. CCM service includes personalized support from designated clinical staff supervised by her physician, including individualized plan of care and coordination with other care providers 2. 24/7 contact phone numbers for assistance for urgent and routine care needs. 3. Service will only be billed when office clinical staff spend 20 minutes or more in a month to coordinate care. 4. Only one practitioner may furnish and bill the service in a calendar month. 5. The patient may stop CCM services at any time (effective at the end of the month) by phone call to the office staff.   Patient agreed to services and verbal consent obtained.   Follow up plan:   Carley Perdue [employer] Scheduler

## 2020-02-01 ENCOUNTER — Telehealth: Payer: Self-pay

## 2020-02-01 ENCOUNTER — Ambulatory Visit: Payer: Medicare Other | Admitting: Pharmacist

## 2020-02-01 ENCOUNTER — Other Ambulatory Visit: Payer: Self-pay

## 2020-02-01 DIAGNOSIS — J449 Chronic obstructive pulmonary disease, unspecified: Secondary | ICD-10-CM

## 2020-02-01 DIAGNOSIS — I10 Essential (primary) hypertension: Secondary | ICD-10-CM

## 2020-02-01 DIAGNOSIS — I4891 Unspecified atrial fibrillation: Secondary | ICD-10-CM

## 2020-02-01 NOTE — Chronic Care Management (AMB) (Signed)
Chronic Care Management Pharmacy  Name: Teresa Frey  MRN: [id number] DOB: 08-16-1929  Chief Complaint/ HPI  Teresa Frey,  86 y.o. , female presents for their Initial CCM visit with the clinical pharmacist on telephone due to COVID-19 pandemic.  PCP : [name], [name], MD  Their chronic conditions include: HTN, Afib,   Office Visits: -09/21/19 Visit w/Dr. [name]: Presented for AWV and chronic disease management. No medication changes were made.  Consult Visit: -12/30/19 Frederik Schmidt, RN (cardiology): INR 3.0 (goal 2-3), warfarin 10 mg every day; 6 week follow up  -12/02/19 Dr Erlinda Hong (ortho): Presented for left thigh/knee pain. Pt has taken Tylenol without significant relief. Started Medrol dosepak.  -11/13/19 Cassandra Heilingoetter, PA (oncology): Presented for breast cancer follow up. Continues anastrazole. Declined bisphosphonate therapy despite history of compression fracture and frequent falls.   -11/09/19 Dr. Ernestina Patches (phys med): methylprednisolone spinal injection administered.   09/10/19 Dr Percival Spanish (cardiology): Presented for A fib follow up.Pt has concerns about medicines. No medication changes made.  Medications: Outpatient Encounter Medications as of 02/01/2020  Medication Sig  . acetaminophen (TYLENOL) 325 MG tablet Take 1-2 tablets (325-650 mg total) by mouth every 6 (six) hours as needed for mild pain (pain score 1-3 or temp > 100.5).  Marland Kitchen albuterol (VENTOLIN HFA) 108 (90 Base) MCG/ACT inhaler Inhale 2 puffs into the lungs every 6 (six) hours as needed for wheezing or shortness of breath.  . anastrozole (ARIMIDEX) 1 MG tablet TAKE 1 TABLET BY MOUTH DAILY  . Ascorbic Acid (VITAMIN C) 1000 MG tablet Take 1,000 mg by mouth daily.  Marland Kitchen BIOTIN 5000 PO Take 1 tablet by mouth daily.  . Calcium Carb-Cholecalciferol (CALCIUM 1000 + D PO) Take 1 tablet by mouth daily.  . calcium-vitamin D (OSCAL WITH D) 500-200 MG-UNIT tablet Take 1 tablet by mouth 3 (three) times daily. (Patient taking  differently: Take 1 tablet by mouth daily. )  . hydrochlorothiazide (MICROZIDE) 12.5 MG capsule Take 1 capsule (12.5 mg total) by mouth daily.  . metoprolol tartrate (LOPRESSOR) 25 MG tablet TAKE 1 TABLET BY MOUTH DAILY.  . Multiple Vitamins-Minerals (CENTRUM SILVER ADULT 50+) TABS Take 1 tablet by mouth daily.  Marland Kitchen omega-3 acid ethyl esters (LOVAZA) 1 g capsule Take 1 g by mouth daily.   . sertraline (ZOLOFT) 50 MG tablet TAKE 1 TABLET BY MOUTH DAILY.  . vitamin B-12 (CYANOCOBALAMIN) 1000 MCG tablet Take 1,000 mcg by mouth daily.  Marland Kitchen warfarin (COUMADIN) 5 MG tablet TAKE UP TO 2 TABLETS DAILY OR AS DIRECTED.  Marland Kitchen docusate sodium (COLACE) 100 MG capsule Take 1 capsule (100 mg total) by mouth 2 (two) times daily.  . methylPREDNISolone (MEDROL DOSEPAK) 4 MG TBPK tablet Use as directed  . polyethylene glycol (MIRALAX / GLYCOLAX) packet Take 17 g by mouth daily as needed for mild constipation.  Marland Kitchen zinc sulfate 220 (50 Zn) MG capsule Take 1 capsule (220 mg total) by mouth daily.   No facility-administered encounter medications on file as of 02/01/2020.   Teresa Frey has lived in [place] for her whole life except for a brief time in [place]. She currently lives alone but rents out her basement apartment. Her daughter lives next door and she sees her every day. Daughter does the grocery shopping.  She reports that she mostly eats out or eats frozen meals as it's difficult to do the cooking. She ambulates with a cane. She can do her own laundry and has a cleaning lady come once a month for everything  else.   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  Pt enjoys watching TV and reading books.  She reports doing orthopedic exercises daily post fall and sleeps for about 8 - 10 hours per night.    Current Diagnosis/Assessment:  Goals Addressed            This Visit's Progress   . Pharmacy Care Plan       CARE PLAN ENTRY (see longitudinal plan of care for  additional care plan information)  Current Barriers:  . Chronic Disease Management support, education, and care coordination needs related to Hypertension, Atrial Fibrillation, and COPD   Hypertension BP Readings from Last 3 Encounters:  11/13/19 (!) 141/58  11/09/19 (!) 147/67  09/21/19 122/70   . Pharmacist Clinical Goal(s): o Over the next 180 days, patient will work with PharmD and providers to achieve BP goal <140/90 . Current regimen:  . Hydrochlorothiazide 12.5 mg daily . Metoprolol 25 mg daily . Interventions: o Looking out for signs of low or high blood pressure . Patient self care activities - Over the next 180 days, patient will: o Check blood pressure 1-2 times a week, document, and provide at future appointments o Ensure daily salt intake < 2300 mg/day  A fib Pulse Readings from Last 3 Encounters:  11/13/19 (!) 55  11/09/19 (!) 58  09/21/19 62   . Pharmacist Clinical Goal(s): o Over the next 180 days, patient will work with PharmD and providers to maintain pulse between 60 - 100 beats per minute. . Current regimen:  . Warfarin 10 mg daily . Metoprolol tartrate 25 mg daily . Interventions: o Discussed signs and symptoms of bleeding . Patient self care activities - Over the next 180 days, patient will: o Check pulse and record along with blood pressure 1 to 2 times weekly  COPD . Pharmacist Clinical Goal(s) o Over the next 180 days, patient will work with PharmD and providers to  . Current regimen:  o Albuterol inhaler as needed for shortness of breath . Interventions: o Discussed frequency of inhaler use and proper inhaler technique . Patient self care activities - Over the next 180 days, patient will: o Continue using inhaler only as needed  Medication management . Pharmacist Clinical Goal(s): o Over the next 180 days, patient will work with PharmD and providers to maintain optimal medication adherence . Current pharmacy: [facility] . Interventions o Comprehensive medication review performed. o Continue current medication management strategy . Patient self care activities - Over the next 180 days, patient will: o Reach out to the pharmacist with any questions during the open enrollment period for Medicare o Take medications as prescribed o Report any questions or concerns to PharmD and/or provider(s)  Initial goal documentation       SDOH Interventions     Most Recent Value  SDOH Interventions  Financial Strain Interventions Intervention Not Indicated  Transportation Interventions Intervention Not Indicated      Hypertension   BP goal is:  <140/90  Office blood pressures are  BP Readings from Last 3 Encounters:  11/13/19 (!) 141/58  11/09/19 (!) 147/67  09/21/19 122/70   Patient checks BP at home 1-2x per week Patient home BP readings are ranging:   Patient has failed these meds in the past: none Patient is currently borderline controlled on the following medications:  . Hydrochlorothiazide 12.5 mg daily . Metoprolol 25 mg daily  We discussed how to  recognize low or high blood pressure and the importance of monitoring at home.  Plan Start monitoring blood pressure at home. Continue current medications and control with diet and exercise     AFIB   Patient is currently rate controlled. Office heart rates are  Pulse Readings from Last 3 Encounters:  11/13/19 (!) 55  11/09/19 (!) 58  09/21/19 62    CHA2DS2-VASc Score =   4 The patient's score is based upon: hypertension, age, sex      Patient has failed these meds in the past: Eliquis (cost) Patient is currently on the following medications:  . Warfarin 10 mg daily . Metoprolol tartrate 25 mg daily { We discussed:  signs and symptoms of bleeding.    Plan  Continue current medications    COPD   Last spirometry score: none found Current COPD Classification:  A (low sx, <2 exacerbations/yr)  Lab Results  Component Value  Date/Time   EOSPCT 1 11/13/2019 09:58 AM   EOSPCT 2.1 04/19/2017 12:42 PM   EOSABS 0.1 11/13/2019 09:58 AM   EOSABS 0.2 04/19/2017 12:42 PM    Patient has failed these meds in past: none Patient is currently controlled on the following medications:  . Albuterol 2 puffs every 6 hours as needed for shortness of breath  Using maintenance inhaler regularly? No - not prescribed Frequency of rescue inhaler use:  infrequently   We discussed:  proper inhaler technique and and frequency of inhaler use  Plan  Continue current medications  Frequent crying   Depression screen [facility] 2/9 09/21/2019 02/24/2018 11/20/2016  Decreased Interest 0 0 0  Down, Depressed, Hopeless 0 0 0  PHQ - 2 Score 0 0 0  Some recent data might be hidden    Patient has failed these meds in past: none Patient is currently controlled on the following medications:  . Sertraline 50 mg daily  We discussed: utility of medication  Plan  Continue current medications   Breast Cancer   Patient has failed these meds in past: none Patient is currently controlled on the following medications:  . Anastrazole 1 mg daily  We discussed:  Plan for end of therapy.  Plan  Continue current medications  Miscellaneous   Patient is currently on the following medications:  . Tylenol 325 mg as needed (typically once every AM) . Vitamin C 1000 mg daily . Biotin 5000 daily . Calcium & vitamin D 500-200 mg-unit one times daily . Ccalcium 1000 mg & D3 1000 units once daily . Centrum silver daily . Vitamin B-12 1000 mcg daily . Fish oil 1 g daily  We discussed:  Proper administration of vitamin B12 and recommended doses vitamins and Tylenol.  Plan  Continue current medications  Vaccines   Reviewed and discussed patient's vaccination history.    Immunization History  Administered Date(s) Administered  . Influenza Split 04/05/2011, 05/07/2012  . Influenza Whole 03/04/2006, 05/06/2007, 02/25/2008, 03/25/2010,  03/21/2013  . Influenza, High Dose Seasonal PF 04/05/2015, 02/29/2016, 04/19/2017, 04/04/2018  . Influenza,inj,Quad PF,6+ Mos 04/07/2014  . Janssen (J&J) SARS-COV-2 Vaccination 08/18/2019  . Pneumococcal Conjugate-13 06/12/2013  . Pneumococcal Polysaccharide-23 03/04/2006  . Tdap 01/24/2015  . Tetanus 06/12/2013  . Zoster 09/25/2013    Plan  Recommended patient receive Shingrix vaccine in pharmacy. Pt declined at this time as she does not have Part D coverage and cannot afford. Plan to revisit    Medication Management   Pt uses [facility] for all medications  Uses pill box? No -  uses her shelf and takes all medicines at once Pt endorses 100% compliance  We discussed: Calling medicare during open enrollment to discuss adding a Part D plan for her insurance.  Plan  Continue current medication management strategy. Pt agreed to reach out during open enrollment period with questions.    Follow up: 6 month phone visit  Jeni Salles, PharmD Clinical Pharmacist [facility] [place] [phone]

## 2020-02-01 NOTE — Telephone Encounter (Signed)
-----   Message from Erskin Burnet, [facility] sent at 02/01/2020  9:12 AM EDT ----- Regarding: CCM referral Hi,  I am the new clinical pharmacist taking Annette's place here at [facility]. I have Latimer scheduled for a CCM visit with me later today and I was wondering if you could please place the referral for her appointment?   Thank you, Maddie  Jeni Salles, PharmD Clinical Pharmacist [facility] [place] [phone]

## 2020-02-02 NOTE — Patient Instructions (Addendum)
Visit Information  Goals Addressed            This Visit's Progress   . Pharmacy Care Plan       CARE PLAN ENTRY (see longitudinal plan of care for additional care plan information)  Current Barriers:  . Chronic Disease Management support, education, and care coordination needs related to Hypertension, Atrial Fibrillation, and COPD   Hypertension BP Readings from Last 3 Encounters:  11/13/19 (!) 141/58  11/09/19 (!) 147/67  09/21/19 122/70   . Pharmacist Clinical Goal(s): o Over the next 180 days, patient will work with PharmD and providers to achieve BP goal <140/90 . Current regimen:  . Hydrochlorothiazide 12.5 mg daily . Metoprolol 25 mg daily . Interventions: o Looking out for signs of low or high blood pressure . Patient self care activities - Over the next 180 days, patient will: o Check blood pressure 1-2 times a week, document, and provide at future appointments o Ensure daily salt intake < 2300 mg/day  A fib Pulse Readings from Last 3 Encounters:  11/13/19 (!) 55  11/09/19 (!) 58  09/21/19 62   . Pharmacist Clinical Goal(s): o Over the next 180 days, patient will work with PharmD and providers to maintain pulse between 60 - 100 beats per minute. . Current regimen:  . Warfarin 10 mg daily . Metoprolol tartrate 25 mg daily . Interventions: o Discussed signs and symptoms of bleeding . Patient self care activities - Over the next 180 days, patient will: o Check pulse and record along with blood pressure 1 to 2 times weekly  COPD . Pharmacist Clinical Goal(s) o Over the next 180 days, patient will work with PharmD and providers to  . Current regimen:  o Albuterol inhaler as needed for shortness of breath . Interventions: o Discussed frequency of inhaler use and proper inhaler technique . Patient self care activities - Over the next 180 days, patient will: o Continue using inhaler only as needed  Medication management . Pharmacist Clinical  Goal(s): o Over the next 180 days, patient will work with PharmD and providers to maintain optimal medication adherence . Current pharmacy: [facility] . Interventions o Comprehensive medication review performed. o Continue current medication management strategy . Patient self care activities - Over the next 180 days, patient will: o Reach out to the pharmacist with any questions during the open enrollment period for Medicare o Take medications as prescribed o Report any questions or concerns to PharmD and/or provider(s)  Initial goal documentation        Teresa Frey was given information about Chronic Care Management services today including:  1. CCM service includes personalized support from designated clinical staff supervised by her physician, including individualized plan of care and coordination with other care providers 2. 24/7 contact phone numbers for assistance for urgent and routine care needs. 3. Standard insurance, coinsurance, copays and deductibles apply for chronic care management only during months in which we provide at least 20 minutes of these services. Most insurances cover these services at 100%, however patients may be responsible for any copay, coinsurance and/or deductible if applicable. This service may help you avoid the need for more expensive face-to-face services. 4. Only one practitioner may furnish and bill the service in a calendar month. 5. The patient may stop CCM services at any time (effective at the end of the month) by phone call to the office staff.  Patient agreed to services and verbal consent obtained.   The patient verbalized understanding of  instructions provided today and agreed to receive a mailed copy of patient instruction and/or educational materials. Telephone follow up appointment with pharmacy team member scheduled for: Aug 01, 2020 @ [place], PharmD Clinical Pharmacist [facility]  [place]   Managing Your Hypertension Hypertension is commonly called high blood pressure. This is when the force of your blood pressing against the walls of your arteries is too strong. Arteries are blood vessels that carry blood from your heart throughout your body. Hypertension forces the heart to work harder to pump blood, and may cause the arteries to become narrow or stiff. Having untreated or uncontrolled hypertension can cause heart attack, stroke, kidney disease, and other problems. What are blood pressure readings? A blood pressure reading consists of a higher number over a lower number. Ideally, your blood pressure should be below 120/80. The first ("top") number is called the systolic pressure. It is a measure of the pressure in your arteries as your heart beats. The second ("bottom") number is called the diastolic pressure. It is a measure of the pressure in your arteries as the heart relaxes. What does my blood pressure reading mean? Blood pressure is classified into four stages. Based on your blood pressure reading, your health care provider may use the following stages to determine what type of treatment you need, if any. Systolic pressure and diastolic pressure are measured in a unit called mm Hg. Normal  Systolic pressure: below 053.  Diastolic pressure: below 80. Elevated  Systolic pressure: 976-734.  Diastolic pressure: below 80. Hypertension stage 1  Systolic pressure: 193-790.  Diastolic pressure: 24-09. Hypertension stage 2  Systolic pressure: 735 or above.  Diastolic pressure: 90 or above. What health risks are associated with hypertension? Managing your hypertension is an important responsibility. Uncontrolled hypertension can lead to:  A heart attack.  A stroke.  A weakened blood vessel (aneurysm).  Heart failure.  Kidney damage.  Eye damage.  Metabolic syndrome.  Memory and concentration problems. What changes can I make to  manage my hypertension? Hypertension can be managed by making lifestyle changes and possibly by taking medicines. Your health care provider will help you make a plan to bring your blood pressure within a normal range. Eating and drinking   Eat a diet that is high in fiber and potassium, and low in salt (sodium), added sugar, and fat. An example eating plan is called the DASH (Dietary Approaches to Stop Hypertension) diet. To eat this way: ? Eat plenty of fresh fruits and vegetables. Try to fill half of your plate at each meal with fruits and vegetables. ? Eat whole grains, such as whole wheat pasta, brown rice, or whole grain bread. Fill about one quarter of your plate with whole grains. ? Eat low-fat diary products. ? Avoid fatty cuts of meat, processed or cured meats, and poultry with skin. Fill about one quarter of your plate with lean proteins such as fish, chicken without skin, beans, eggs, and tofu. ? Avoid premade and processed foods. These tend to be higher in sodium, added sugar, and fat.  Reduce your daily sodium intake. Most people with hypertension should eat less than 1,500 mg of sodium a day.  Limit alcohol intake to no more than 1 drink a day for nonpregnant women and 2 drinks a day for men. One drink equals 12 oz of beer, 5 oz of wine, or 1 oz of hard liquor. Lifestyle  Work with your health care provider to maintain a healthy body  weight, or to lose weight. Ask what an ideal weight is for you.  Get at least 30 minutes of exercise that causes your heart to beat faster (aerobic exercise) most days of the week. Activities may include walking, swimming, or biking.  Include exercise to strengthen your muscles (resistance exercise), such as weight lifting, as part of your weekly exercise routine. Try to do these types of exercises for 30 minutes at least 3 days a week.  Do not use any products that contain nicotine or tobacco, such as cigarettes and e-cigarettes. If you need help  quitting, ask your health care provider.  Control any long-term (chronic) conditions you have, such as high cholesterol or diabetes. Monitoring  Monitor your blood pressure at home as told by your health care provider. Your personal target blood pressure may vary depending on your medical conditions, your age, and other factors.  Have your blood pressure checked regularly, as often as told by your health care provider. Working with your health care provider  Review all the medicines you take with your health care provider because there may be side effects or interactions.  Talk with your health care provider about your diet, exercise habits, and other lifestyle factors that may be contributing to hypertension.  Visit your health care provider regularly. Your health care provider can help you create and adjust your plan for managing hypertension. Will I need medicine to control my blood pressure? Your health care provider may prescribe medicine if lifestyle changes are not enough to get your blood pressure under control, and if:  Your systolic blood pressure is 130 or higher.  Your diastolic blood pressure is 80 or higher. Take medicines only as told by your health care provider. Follow the directions carefully. Blood pressure medicines must be taken as prescribed. The medicine does not work as well when you skip doses. Skipping doses also puts you at risk for problems. Contact a health care provider if:  You think you are having a reaction to medicines you have taken.  You have repeated (recurrent) headaches.  You feel dizzy.  You have swelling in your ankles.  You have trouble with your vision. Get help right away if:  You develop a severe headache or confusion.  You have unusual weakness or numbness, or you feel faint.  You have severe pain in your chest or abdomen.  You vomit repeatedly.  You have trouble breathing. Summary  Hypertension is when the force of blood  pumping through your arteries is too strong. If this condition is not controlled, it may put you at risk for serious complications.  Your personal target blood pressure may vary depending on your medical conditions, your age, and other factors. For most people, a normal blood pressure is less than 120/80.  Hypertension is managed by lifestyle changes, medicines, or both. Lifestyle changes include weight loss, eating a healthy, low-sodium diet, exercising more, and limiting alcohol. This information is not intended to replace advice given to you by your health care provider. Make sure you discuss any questions you have with your health care provider. Document Revised: 09/12/2018 Document Reviewed: 04/18/2016 Elsevier Patient Education  [place].

## 2020-02-09 ENCOUNTER — Other Ambulatory Visit: Payer: Self-pay | Admitting: Cardiology

## 2020-02-10 ENCOUNTER — Ambulatory Visit (INDEPENDENT_AMBULATORY_CARE_PROVIDER_SITE_OTHER): Payer: Medicare Other

## 2020-02-10 ENCOUNTER — Other Ambulatory Visit: Payer: Self-pay

## 2020-02-10 DIAGNOSIS — I4891 Unspecified atrial fibrillation: Secondary | ICD-10-CM

## 2020-02-10 DIAGNOSIS — Z7901 Long term (current) use of anticoagulants: Secondary | ICD-10-CM | POA: Diagnosis not present

## 2020-02-10 LAB — POCT INR: INR: 3.5 — AB (ref 2.0–3.0)

## 2020-02-10 NOTE — Patient Instructions (Signed)
Take only 1 tablet Thursday and then Continue with 2 tablets daily Repeat INR in 4 weeks.

## 2020-02-23 DIAGNOSIS — M8589 Other specified disorders of bone density and structure, multiple sites: Secondary | ICD-10-CM | POA: Diagnosis not present

## 2020-02-23 DIAGNOSIS — R928 Other abnormal and inconclusive findings on diagnostic imaging of breast: Secondary | ICD-10-CM | POA: Diagnosis not present

## 2020-02-24 ENCOUNTER — Other Ambulatory Visit: Payer: Self-pay | Admitting: Cardiology

## 2020-02-24 NOTE — Telephone Encounter (Signed)
Refill Request.  

## 2020-03-01 ENCOUNTER — Telehealth: Payer: Self-pay

## 2020-03-01 NOTE — Telephone Encounter (Signed)
I spoke to Teresa Frey.  I reviewed Dr. Ernestina Penna comments and recommendations regarding her recent bone density scan.  She states she does not want to take a biphosphonate.  She has read the side effects and is not interested.  She is taking calcium and vit d supplements.

## 2020-03-02 ENCOUNTER — Encounter: Payer: Self-pay | Admitting: Hematology

## 2020-03-11 ENCOUNTER — Ambulatory Visit (INDEPENDENT_AMBULATORY_CARE_PROVIDER_SITE_OTHER): Payer: Medicare Other

## 2020-03-11 ENCOUNTER — Other Ambulatory Visit: Payer: Self-pay

## 2020-03-11 DIAGNOSIS — Z7901 Long term (current) use of anticoagulants: Secondary | ICD-10-CM | POA: Diagnosis not present

## 2020-03-11 DIAGNOSIS — I4891 Unspecified atrial fibrillation: Secondary | ICD-10-CM | POA: Diagnosis not present

## 2020-03-11 LAB — POCT INR: INR: 2.3 (ref 2.0–3.0)

## 2020-03-11 NOTE — Patient Instructions (Signed)
Continue with 2 tablets daily Repeat INR in 6 weeks  

## 2020-03-22 ENCOUNTER — Ambulatory Visit (INDEPENDENT_AMBULATORY_CARE_PROVIDER_SITE_OTHER): Payer: Medicare Other | Admitting: Family Medicine

## 2020-03-22 ENCOUNTER — Encounter: Payer: Self-pay | Admitting: Family Medicine

## 2020-03-22 ENCOUNTER — Other Ambulatory Visit: Payer: Self-pay

## 2020-03-22 VITALS — BP 126/80 | HR 68 | Resp 16 | Ht 64.0 in | Wt 158.0 lb

## 2020-03-22 DIAGNOSIS — F419 Anxiety disorder, unspecified: Secondary | ICD-10-CM

## 2020-03-22 DIAGNOSIS — J449 Chronic obstructive pulmonary disease, unspecified: Secondary | ICD-10-CM | POA: Diagnosis not present

## 2020-03-22 DIAGNOSIS — I4891 Unspecified atrial fibrillation: Secondary | ICD-10-CM

## 2020-03-22 DIAGNOSIS — Z23 Encounter for immunization: Secondary | ICD-10-CM

## 2020-03-22 DIAGNOSIS — I10 Essential (primary) hypertension: Secondary | ICD-10-CM | POA: Diagnosis not present

## 2020-03-22 NOTE — Assessment & Plan Note (Signed)
Problem is well controlled. Continue Albuterol inh 2 puff q 4-6 hours as needed.

## 2020-03-22 NOTE — Assessment & Plan Note (Signed)
BP adequately controlled. Continue Metoprolol tartrate 25 mg bid and HCTZ 12.5 mg daily. Continue monitoring BP at home and low salt diet.

## 2020-03-22 NOTE — Progress Notes (Signed)
HPI: Teresa Frey is a 86 y.o. female, who is here today for 6 months follow up.   She was last seen on 09/21/19. No new problems or ER visits since her last visit. She has followed with oncologist, right breast cancer.  Follows with Dr Erlinda Hong  For knee and thigh pain. She is using her cane to assist with gait.  COPD:Negative for worsening cough or wheezing.  Occasional morning cough with some clear sputum. Negative for hemoptysis.  + Former smoker. She has not needed albuterol in over 6 months.  HTN: She is on Metoprolol tartrate 25 mg bid and HCTZ 12.5 mg daily. She is monitoring BP at home, usually 120's/70's. Negative for severe/frequent headache, visual changes, chest pain, dyspnea, focal weakness, or edema.  Atrial fib on coumadin.  Lab Results  Component Value Date   CREATININE 0.82 11/13/2019   BUN 21 11/13/2019   NA 145 11/13/2019   K 4.5 11/13/2019   CL 105 11/13/2019   CO2 30 11/13/2019   Anxiety: She is on Sertraline 50 mg daily. She has taken medication for years, same dose. Negative for depressed mood. Dealing well with stressors.  Review of Systems  Constitutional: Negative for activity change, appetite change, fatigue and fever.  HENT: Negative for mouth sores, nosebleeds and sore throat.   Cardiovascular: Negative for palpitations.  Gastrointestinal: Negative for abdominal pain, nausea and vomiting.       Negative for changes in bowel habits.  Genitourinary: Negative for decreased urine volume and hematuria.  Musculoskeletal: Positive for arthralgias and gait problem.  Neurological: Negative for syncope, facial asymmetry and weakness.  Psychiatric/Behavioral: Negative for confusion.  Rest of ROS, see pertinent positives sand negatives in HPI  Current Outpatient Medications on File Prior to Visit  Medication Sig Dispense Refill  . acetaminophen (TYLENOL) 325 MG tablet Take 1-2 tablets (325-650 mg total) by mouth every 6 (six) hours as needed for  mild pain (pain score 1-3 or temp > 100.5).    Marland Kitchen albuterol (VENTOLIN HFA) 108 (90 Base) MCG/ACT inhaler Inhale 2 puffs into the lungs every 6 (six) hours as needed for wheezing or shortness of breath. 18 g 2  . anastrozole (ARIMIDEX) 1 MG tablet TAKE 1 TABLET BY MOUTH DAILY 90 tablet 3  . Ascorbic Acid (VITAMIN C) 1000 MG tablet Take 1,000 mg by mouth daily.    Marland Kitchen BIOTIN 5000 PO Take 1 tablet by mouth daily.    . Calcium Carb-Cholecalciferol (CALCIUM 1000 + D PO) Take 1 tablet by mouth daily.    . calcium-vitamin D (OSCAL WITH D) 500-200 MG-UNIT tablet Take 1 tablet by mouth 3 (three) times daily. (Patient taking differently: Take 1 tablet by mouth daily. ) 90 tablet 12  . docusate sodium (COLACE) 100 MG capsule Take 1 capsule (100 mg total) by mouth 2 (two) times daily. 10 capsule 0  . hydrochlorothiazide (MICROZIDE) 12.5 MG capsule Take 1 capsule (12.5 mg total) by mouth daily. 90 capsule 3  . methylPREDNISolone (MEDROL DOSEPAK) 4 MG TBPK tablet Use as directed 21 tablet 0  . metoprolol tartrate (LOPRESSOR) 25 MG tablet TAKE 1 TABLET BY MOUTH DAILY. 90 tablet 1  . Multiple Vitamins-Minerals (CENTRUM SILVER ADULT 50+) TABS Take 1 tablet by mouth daily.    Marland Kitchen omega-3 acid ethyl esters (LOVAZA) 1 g capsule Take 1 g by mouth daily.     . polyethylene glycol (MIRALAX / GLYCOLAX) packet Take 17 g by mouth daily as needed for mild constipation. 14 each  0  . sertraline (ZOLOFT) 50 MG tablet TAKE 1 TABLET BY MOUTH DAILY. 90 tablet 1  . vitamin B-12 (CYANOCOBALAMIN) 1000 MCG tablet Take 1,000 mcg by mouth daily.    Marland Kitchen warfarin (COUMADIN) 5 MG tablet TAKE UP TO 2 TABLETS DAILY OR AS DIRECTED. 180 tablet 1  . zinc sulfate 220 (50 Zn) MG capsule Take 1 capsule (220 mg total) by mouth daily. 42 capsule 0   No current facility-administered medications on file prior to visit.   Past Medical History:  Diagnosis Date  . ANXIETY 04/07/2007  . Breast cancer of upper-outer quadrant of right female breast ([place])  01/11/2016  . COLONIC POLYPS, HX OF 04/02/2007   adenomatous  . DIVERTICULITIS, HX OF 04/02/2007  . Diverticulosis   . DJD (degenerative joint disease)   . Hemorrhoids   . HYPERTENSION 04/02/2007  . OSTEOARTHRITIS, SEVERE 04/07/2007  . 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 WEIGHT GAIN 11/13/2007   Allergies  Allergen Reactions  . Hydrocodone-Acetaminophen   . Tramadol     Loss of control    Social History   Socioeconomic History  . Marital status: Married    Spouse name: Not on file  . Number of children: 2  . Years of education: Not on file  . Highest education level: Not on file  Occupational History  . Not on file  Tobacco Use  . Smoking status: Former Smoker    Packs/day: 1.00    Years: 27.00    Pack years: 27.00    Types: Cigarettes    Quit date: 06/04/1978    Years since quitting: 41.8  . Smokeless tobacco: Never Used  Vaping Use  . Vaping Use: Never used  Substance and Sexual Activity  . Alcohol use: Yes    Alcohol/week: 7.0 standard drinks    Types: 7 Glasses of wine per week    Comment: 1-2 glasses wine after dinner, but not necessarily each day  . Drug use: No  . Sexual activity: Not on file  Other Topics Concern  . Not on file  Social History Narrative  . Not on file   Social Determinants of Health   Financial Resource Strain: Low Risk   . Difficulty of Paying Living Expenses: Not very hard  Food Insecurity:   . Worried About Charity fundraiser in the Last Year: Not on file  . Ran Out of Food in the Last Year: Not on file  Transportation Needs: No Transportation Needs  . Lack of Transportation (Medical): No  . Lack of Transportation (Non-Medical): No  Physical Activity:   . Days of Exercise per Week: Not on file  . Minutes of Exercise per Session: Not on file  Stress:   . Feeling of Stress : Not on file  Social Connections:   . Frequency of Communication with Friends and Family: Not on file  . Frequency of Social Gatherings with  Friends and Family: Not on file  . Attends Religious Services: Not on file  . Active Member of Clubs or Organizations: Not on file  . Attends Archivist Meetings: Not on file  . Marital Status: Not on file    Vitals:   03/22/20 1115  BP: 126/80  Pulse: 68  Resp: 16  SpO2: 90%   Body mass index is 27.12 kg/m.  Physical Exam Vitals and nursing note reviewed.  Constitutional:      General: She is not in acute distress.    Appearance:  She is well-developed and well-groomed.  HENT:     Head: Normocephalic and atraumatic.     Mouth/Throat:     Mouth: Mucous membranes are moist.     Dentition: Has dentures.     Pharynx: Oropharynx is clear.  Eyes:     Conjunctiva/sclera: Conjunctivae normal.     Pupils: Pupils are equal, round, and reactive to light.  Cardiovascular:     Rate and Rhythm: Normal rate and regular rhythm.     Pulses:          Posterior tibial pulses are 2+ on the right side and 2+ on the left side.     Heart sounds: Murmur (SEM I/VI RUSB) heard.   Pulmonary:     Effort: Pulmonary effort is normal. No respiratory distress.     Breath sounds: Normal breath sounds.  Abdominal:     Palpations: Abdomen is soft.     Tenderness: There is no abdominal tenderness.  Lymphadenopathy:     Cervical: No cervical adenopathy.  Skin:    General: Skin is warm.     Findings: No erythema or rash.  Neurological:     Mental Status: She is alert and oriented to person, place, and time.     Cranial Nerves: No cranial nerve deficit.     Comments: Antalgic gait assisted by a cane.   ASSESSMENT AND PLAN:   Teresa Frey was seen today for 6 months follow-up.  Orders Placed This Encounter  Procedures  . Flu Vaccine QUAD High Dose(Fluad)    COPD (chronic obstructive pulmonary disease) ([place]) Problem is well controlled. Continue Albuterol inh 2 puff q 4-6 hours as needed.  Essential hypertension BP adequately controlled. Continue Metoprolol tartrate 25 mg  bid and HCTZ 12.5 mg daily. Continue monitoring BP at home and low salt diet.  A-fib ([facility]) Rhythm and rate controlled today. Continue Coumadin and Metoprolol tartrate. INR monitored at coumadin clinic periodically. Following with cardiologist in 09/2020.  Anxiety disorder, unspecified Problem has been stable. Continue Sertraline 50 mg daily. Follow up in 12 months,before if needed.  Need for influenza vaccination - Flu Vaccine QUAD High Dose(Fluad)  She is following with oncologist and cardiologist regularly, so I think it is appropriate to continue annual visits, before if needed.  Return in about 1 year (around 03/22/2021) for F/U. She needs AWV after 09/21/19.Marland Kitchen   Tait Balistreri G. [name], MD  [facility]. [place] office.

## 2020-03-22 NOTE — Patient Instructions (Addendum)
A few things to remember from today's visit: No changes today. I will see you back in 03/2021, before if needed. Continue monitoring blood pressure at home.  If you need refills please call your pharmacy. Do not use My Chart to request refills or for acute issues that need immediate attention.    Please be sure medication list is accurate. If a new problem present, please set up appointment sooner than planned today.

## 2020-03-22 NOTE — Assessment & Plan Note (Signed)
Problem has been stable. Continue Sertraline 50 mg daily. Follow up in 12 months,before if needed.

## 2020-03-22 NOTE — Assessment & Plan Note (Signed)
Rhythm and rate controlled today. Continue Coumadin and Metoprolol tartrate. INR monitored at coumadin clinic periodically. Following with cardiologist in 09/2020.

## 2020-03-24 ENCOUNTER — Encounter: Payer: Self-pay | Admitting: Family Medicine

## 2020-04-20 ENCOUNTER — Ambulatory Visit (INDEPENDENT_AMBULATORY_CARE_PROVIDER_SITE_OTHER): Payer: Medicare Other

## 2020-04-20 DIAGNOSIS — I4891 Unspecified atrial fibrillation: Secondary | ICD-10-CM | POA: Diagnosis not present

## 2020-04-20 DIAGNOSIS — Z7901 Long term (current) use of anticoagulants: Secondary | ICD-10-CM

## 2020-04-20 LAB — POCT INR: INR: 3 (ref 2.0–3.0)

## 2020-04-20 NOTE — Patient Instructions (Signed)
Continue with 2 tablets daily Repeat INR in 6 weeks  

## 2020-04-30 DIAGNOSIS — Z23 Encounter for immunization: Secondary | ICD-10-CM | POA: Diagnosis not present

## 2020-05-13 NOTE — Progress Notes (Signed)
[place]   [phone] [phone]   Clinic Follow up Note   Patient Care Team: [name], [name], MD as PCP - General (Family Medicine) [name], MD as PCP - Cardiology (Cardiology) Fanny Skates, MD as Consulting Physician (General Surgery) Truitt Merle, MD as Consulting Physician (Hematology) Gery Pray, MD as Consulting Physician ([place]) Delice Bison, Charlestine Massed, NP as Nurse Practitioner (Hematology and Oncology) Viona Gilmore, [facility] as Pharmacist (Pharmacist)  Date of Service:  05/16/2020  CHIEF COMPLAINT: Follow up right breast cancer  SUMMARY OF ONCOLOGIC HISTORY: Oncology History Overview Note  Breast cancer of upper-outer quadrant of right female breast [facility])   Staging form: Breast, AJCC 7th Edition   - Clinical stage from 01/18/2016: Stage IA (T1b, N0, M0) - Unsigned   - Pathologic stage from 02/08/2016: Stage IA (T1c, N0, cM0) - Signed by Truitt Merle, MD on 02/27/2016    Breast cancer of upper-outer quadrant of right female breast ([place])  01/05/2016 Mammogram   Diagnostic mammogram and ultrasound showed a 9 mm mass in the upper outer quadrant of right breast, axillary nodes were negative.   01/09/2016 Initial Diagnosis   Breast cancer of upper-outer quadrant of right female breast ([place])   01/09/2016 Initial Biopsy   R breast 9:00 position biopsy showed invasive ductal carcinoma, G1-2   01/09/2016 Receptors her2   ER 90%+, PR 40%+, HER2-, Ki67 5%   02/08/2016 Surgery   Right breast lumpectomy, no SLN biopsy     02/08/2016 Pathology Results   Right breast lumpectomy showed invasive and in situ ductal carcinoma, 1.1 cm, margins were negative. Grade 1, no lymphovascular invasion. Atypical lobular hyperplasia.   02/21/2016 Genetic Testing   Genetic testing did not reveal a known deleterious mutation.  Genetic testing did identify a variant of uncertain significance (VUS) called "c.1659G>A (p.Met553Ile)" in one copy of the NBN  gene.  Genes tested include: APC, ATM, AXIN2, BARD1, BMPR1A, BRCA1, BRCA2, BRIP1, CDH1, CDK4, CDKN2A, CHEK2, EPCAM, FANCC, MLH1, MSH2, MSH6, MUTYH, NBN, PALB2, PMS2, POLD1, POLE, PTEN, RAD51C, RAD51D, SCG5/GREM1, SMAD4, STK11, TP53, VHL, and XRCC2.   02/28/2016 -  Anti-estrogen oral therapy   Anastrozole 1 mg daily      CURRENT THERAPY:  anastrozole 57m daily started on 02/28/2016  INTERVAL HISTORY:  Teresa Frey is here for a follow up of right breast cancer. She was last seen by me 10 months ago and seen by NP Cassie 6 months ago in interim. She presents to the clinic alone. She notes she is doing well. She notes she walked in with a cane but used wheelchair down long hallway. She denies having a fall in 2021. She notes she is able to take care of herself and her home. She still drives herself and works in a[employer] days a week. She denies any recent new changes. She is on Anastrozole and tolerating well. She denies hot flashers or new joint pain.    REVIEW OF SYSTEMS:   Constitutional: Denies fevers, chills or abnormal weight loss Eyes: Denies blurriness of vision Ears, nose, mouth, throat, and face: Denies mucositis or sore throat Respiratory: Denies cough, dyspnea or wheezes Cardiovascular: Denies palpitation, chest discomfort or lower extremity swelling Gastrointestinal:  Denies nausea, heartburn or change in bowel habits Skin: Denies abnormal skin rashes MSK: (+) joint stiffness and baseline arthritis.  Lymphatics: Denies new lymphadenopathy or easy bruising Neurological:Denies numbness, tingling or new weaknesses Behavioral/Psych: Mood is stable, no new changes  All other systems were reviewed  with the patient and are negative.  MEDICAL HISTORY:  Past Medical History:  Diagnosis Date  . ANXIETY 04/07/2007  . Breast cancer of upper-outer quadrant of right female breast ([place]) 01/11/2016  . COLONIC POLYPS, HX OF 04/02/2007   adenomatous  . DIVERTICULITIS, HX OF 04/02/2007  .  Diverticulosis   . DJD (degenerative joint disease)   . Hemorrhoids   . HYPERTENSION 04/02/2007  . OSTEOARTHRITIS, SEVERE 04/07/2007  . 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 WEIGHT GAIN 11/13/2007    SURGICAL HISTORY: Past Surgical History:  Procedure Laterality Date  . APPENDECTOMY    . BREAST LUMPECTOMY WITH RADIOACTIVE SEED LOCALIZATION Right 02/08/2016   Procedure: RIGHT BREAST LUMPECTOMY WITH RADIOACTIVE SEED LOCALIZATION;  Surgeon: Fanny Skates, MD;  Location: [place];  Service: General;  Laterality: Right;  . ORIF FEMUR FRACTURE Left 04/21/2018   Procedure: OPEN REDUCTION INTERNAL FIXATION (ORIF) PERI PROSTHETIC FEMUR FRACTURE;  Surgeon: Leandrew Koyanagi, MD;  Location: [place];  Service: Orthopedics;  Laterality: Left;  . TONSILLECTOMY    . TOTAL HIP ARTHROPLASTY  1999, left hip    2006 right hip  . TUBAL LIGATION      I have reviewed the social history and family history with the patient and they are unchanged from previous note.  ALLERGIES:  is allergic to hydrocodone-acetaminophen and tramadol.  MEDICATIONS:  Current Outpatient Medications  Medication Sig Dispense Refill  . acetaminophen (TYLENOL) 325 MG tablet Take 1-2 tablets (325-650 mg total) by mouth every 6 (six) hours as needed for mild pain (pain score 1-3 or temp > 100.5).    Marland Kitchen albuterol (VENTOLIN HFA) 108 (90 Base) MCG/ACT inhaler Inhale 2 puffs into the lungs every 6 (six) hours as needed for wheezing or shortness of breath. 18 g 2  . anastrozole (ARIMIDEX) 1 MG tablet TAKE 1 TABLET BY MOUTH DAILY 90 tablet 3  . Ascorbic Acid (VITAMIN C) 1000 MG tablet Take 1,000 mg by mouth daily.    Marland Kitchen BIOTIN 5000 PO Take 1 tablet by mouth daily.    . Calcium Carb-Cholecalciferol (CALCIUM 1000 + D PO) Take 1 tablet by mouth daily.    . calcium-vitamin D (OSCAL WITH D) 500-200 MG-UNIT tablet Take 1 tablet by mouth 3 (three) times daily. (Patient taking differently: Take 1 tablet by mouth daily.) 90  tablet 12  . docusate sodium (COLACE) 100 MG capsule Take 1 capsule (100 mg total) by mouth 2 (two) times daily. 10 capsule 0  . hydrochlorothiazide (MICROZIDE) 12.5 MG capsule Take 1 capsule (12.5 mg total) by mouth daily. 90 capsule 3  . methylPREDNISolone (MEDROL DOSEPAK) 4 MG TBPK tablet Use as directed 21 tablet 0  . metoprolol tartrate (LOPRESSOR) 25 MG tablet TAKE 1 TABLET BY MOUTH DAILY. 90 tablet 1  . Multiple Vitamins-Minerals (CENTRUM SILVER ADULT 50+) TABS Take 1 tablet by mouth daily.    Marland Kitchen omega-3 acid ethyl esters (LOVAZA) 1 g capsule Take 1 g by mouth daily.     . polyethylene glycol (MIRALAX / GLYCOLAX) packet Take 17 g by mouth daily as needed for mild constipation. 14 each 0  . sertraline (ZOLOFT) 50 MG tablet TAKE 1 TABLET BY MOUTH DAILY. 90 tablet 1  . vitamin B-12 (CYANOCOBALAMIN) 1000 MCG tablet Take 1,000 mcg by mouth daily.    Marland Kitchen warfarin (COUMADIN) 5 MG tablet TAKE UP TO 2 TABLETS DAILY OR AS DIRECTED. 180 tablet 1  . zinc sulfate 220 (50 Zn) MG capsule Take  1 capsule (220 mg total) by mouth daily. 42 capsule 0   No current facility-administered medications for this visit.    PHYSICAL EXAMINATION: ECOG PERFORMANCE STATUS: 1 - Symptomatic but completely ambulatory  Vitals:   05/16/20 1016  BP: (!) 147/64  Pulse: (!) 54  Resp: 16  Temp: 98 F (36.7 C)  SpO2: 96%   Filed Weights   05/16/20 1016  Weight: 159 lb 9.6 oz (72.4 kg)    GENERAL:alert, no distress and comfortable SKIN: skin color, texture, turgor are normal, no rashes or significant lesions EYES: normal, Conjunctiva are pink and non-injected, sclera clear  NECK: supple, thyroid normal size, non-tender, without nodularity LYMPH:  no palpable lymphadenopathy in the cervical, axillary  LUNGS: clear to auscultation and percussion with normal breathing effort HEART: regular rate & rhythm and no murmurs and no lower extremity edema ABDOMEN:abdomen soft, non-tender and normal bowel  sounds Musculoskeletal:no cyanosis of digits and no clubbing  NEURO: alert & oriented x 3 with fluent speech, no focal motor/sensory deficits BREAST: s/p right lumpectomy: Surgical incision healed well. Left Breast exam benign.   LABORATORY DATA:  I have reviewed the data as listed CBC Latest Ref Rng & Units 05/16/2020 11/13/2019 01/16/2019  WBC 4.0 - 10.5 K/uL 9.0 11.5(H) 9.5  Hemoglobin 12.0 - 15.0 g/dL 14.4 15.9(H) 13.5  Hematocrit 36.0 - 46.0 % 45.1 49.0(H) 43.1  Platelets 150 - 400 K/uL 187 216 224     CMP Latest Ref Rng & Units 05/16/2020 11/13/2019 01/16/2019  Glucose 70 - 99 mg/dL 85 97 94  BUN 8 - 23 mg/dL 18 21 22   Creatinine 0.44 - 1.00 mg/dL 0.91 0.82 0.83  Sodium 135 - 145 mmol/L 146(H) 145 143  Potassium 3.5 - 5.1 mmol/L 3.9 4.5 4.6  Chloride 98 - 111 mmol/L 107 105 106  CO2 22 - 32 mmol/L 33(H) 30 29  Calcium 8.9 - 10.3 mg/dL 10.4(H) 10.4(H) 10.1  Total Protein 6.5 - 8.1 g/dL 7.1 7.5 6.7  Total Bilirubin 0.3 - 1.2 mg/dL 0.5 0.4 0.3  Alkaline Phos 38 - 126 U/L 211(H) 254(H) 202(H)  AST 15 - 41 U/L 17 23 17   ALT 0 - 44 U/L 13 15 14       RADIOGRAPHIC STUDIES: I have personally reviewed the radiological images as listed and agreed with the findings in the report. No results found.   ASSESSMENT & PLAN:  Teresa Frey is a 86 y.o. female with    1. Breast cancer of upper-outer quadrant of right breast, invasive ductal carcinoma, pT1CN0M0, stage IA, ER+/PR+/HER2- -She was diagnosed in 01/2016. She is s/p right breastlumpectomy.Giving the low Ki-67 and early stage disease, I think this is likely a low risk cancer. I did not recommend adjuvant chemotherapy or Oncotype. -She started antiestrogen with Anastrozole in 02/2016. Tolerating well. [place] for 5 years total. -She is clinically doing well. Lab reviewed, her CBC and CMP are within normal limits except WBC 11.5, Hg 15.9, ANC 8.1, Sodium 146, Ca 10.4, Alk Phos 211. Her physical exam and her 02/2020 mammogram  were unremarkable. There is no clinical concern for recurrence. -Continue surveillance. Next mammogram in 02/2021.  -Continue anastrozole  -F/u in 7 months   2. GeneticTesting was negative  3. HTN, AFib  -She was on hydrochlorothiazide for hypertension before, which was held when she had a 2 fibrillation. She is on low-dose metoprolol. -Given her Afib episode on 06/28/17, she was placed on Lopressor 12.51m BID and Eliquis 5 mg BID  4. Arthralgia,  Joint Stiffness -Chronic left hip pain.Has lower back pain.Follows with orthopedics.She ambulates with cane. -Taking meloxicam. Stable on anastrozole.   5. Mild Osteopenia, 2019 left femur fracture, multiple fractures in 04/2019  -I previously spoke with the patient about the risk for decreased strength in bones with anastrozole -[place] in 04/2018 that resulted in left femur fracture and a fall in 04/2019 with stage T12-L1 compression fracture. She has not had a fall in 2021.  -Her 01/22/17 DEXA was normal. 02/23/20 DEXA showed mild osteopenia with lowest T-score -1.1 at left distal radius.  -I recommended biphosphonate with Zometa infusions or oral Fosamax due to her multiple fractures. Potential benefit and side effects discussed with her. She declined given her age and side effects.  -Continue Vit D daily. Given mild hypercalcemia, she can take calcium supplement every other day.   Plan -Clinically doing well -Continue anastrozole -Lab and F/u in 7 months    No problem-specific Assessment & Plan notes found for this encounter.   No orders of the defined types were placed in this encounter.  All questions were answered. The patient knows to call the clinic with any problems, questions or concerns. No barriers to learning was detected. The total time spent in the appointment was 30 minutes.     Truitt Merle, MD 05/16/2020   I, Joslyn Devon, am acting as scribe for Truitt Merle, MD.   I have reviewed the above documentation for  accuracy and completeness, and I agree with the above.

## 2020-05-16 ENCOUNTER — Encounter: Payer: Self-pay | Admitting: Hematology

## 2020-05-16 ENCOUNTER — Other Ambulatory Visit: Payer: Self-pay

## 2020-05-16 ENCOUNTER — Inpatient Hospital Stay: Payer: Medicare Other

## 2020-05-16 ENCOUNTER — Inpatient Hospital Stay: Payer: Medicare Other | Attending: Hematology | Admitting: Hematology

## 2020-05-16 VITALS — BP 147/64 | HR 54 | Temp 98.0°F | Resp 16 | Ht 64.0 in | Wt 159.6 lb

## 2020-05-16 DIAGNOSIS — Z7901 Long term (current) use of anticoagulants: Secondary | ICD-10-CM | POA: Diagnosis not present

## 2020-05-16 DIAGNOSIS — Z170 Estrogen receptor positive status [ER+]: Secondary | ICD-10-CM | POA: Diagnosis not present

## 2020-05-16 DIAGNOSIS — I4891 Unspecified atrial fibrillation: Secondary | ICD-10-CM | POA: Insufficient documentation

## 2020-05-16 DIAGNOSIS — C50411 Malignant neoplasm of upper-outer quadrant of right female breast: Secondary | ICD-10-CM

## 2020-05-16 DIAGNOSIS — E559 Vitamin D deficiency, unspecified: Secondary | ICD-10-CM

## 2020-05-16 DIAGNOSIS — I10 Essential (primary) hypertension: Secondary | ICD-10-CM | POA: Insufficient documentation

## 2020-05-16 LAB — CMP (CANCER CENTER ONLY)
ALT: 13 U/L (ref 0–44)
AST: 17 U/L (ref 15–41)
Albumin: 3.8 g/dL (ref 3.5–5.0)
Alkaline Phosphatase: 211 U/L — ABNORMAL HIGH (ref 38–126)
Anion gap: 6 (ref 5–15)
BUN: 18 mg/dL (ref 8–23)
CO2: 33 mmol/L — ABNORMAL HIGH (ref 22–32)
Calcium: 10.4 mg/dL — ABNORMAL HIGH (ref 8.9–10.3)
Chloride: 107 mmol/L (ref 98–111)
Creatinine: 0.91 mg/dL (ref 0.44–1.00)
GFR, Estimated: >60 mL/min (ref >60)
Glucose, Bld: 85 mg/dL (ref 70–99)
Potassium: 3.9 mmol/L (ref 3.5–5.1)
Sodium: 146 mmol/L — ABNORMAL HIGH (ref 135–145)
Total Bilirubin: 0.5 mg/dL (ref 0.3–1.2)
Total Protein: 7.1 g/dL (ref 6.5–8.1)

## 2020-05-16 LAB — CBC WITH DIFFERENTIAL (CANCER CENTER ONLY)
Abs Immature Granulocytes: 0.04 10*3/uL (ref 0.00–0.07)
Basophils Absolute: 0 10*3/uL (ref 0.0–0.1)
Basophils Relative: 0 %
Eosinophils Absolute: 0.1 10*3/uL (ref 0.0–0.5)
Eosinophils Relative: 1 %
HCT: 45.1 % (ref 36.0–46.0)
Hemoglobin: 14.4 g/dL (ref 12.0–15.0)
Immature Granulocytes: 0 %
Lymphocytes Relative: 27 %
Lymphs Abs: 2.4 10*3/uL (ref 0.7–4.0)
MCH: 30 pg (ref 26.0–34.0)
MCHC: 31.9 g/dL (ref 30.0–36.0)
MCV: 94 fL (ref 80.0–100.0)
Monocytes Absolute: 0.8 10*3/uL (ref 0.1–1.0)
Monocytes Relative: 9 %
Neutro Abs: 5.6 10*3/uL (ref 1.7–7.7)
Neutrophils Relative %: 63 %
Platelet Count: 187 10*3/uL (ref 150–400)
RBC: 4.8 MIL/uL (ref 3.87–5.11)
RDW: 13 % (ref 11.5–15.5)
WBC Count: 9 10*3/uL (ref 4.0–10.5)
nRBC: 0 % (ref 0.0–0.2)

## 2020-05-16 LAB — VITAMIN D 25 HYDROXY (VIT D DEFICIENCY, FRACTURES): Vit D, 25-Hydroxy: 45.97 ng/mL (ref 30–100)

## 2020-05-18 ENCOUNTER — Telehealth: Payer: Self-pay | Admitting: Hematology

## 2020-05-23 ENCOUNTER — Telehealth: Payer: Self-pay

## 2020-05-23 NOTE — Telephone Encounter (Signed)
Spoke with pt made her aware of lab results and no new orders

## 2020-06-01 ENCOUNTER — Ambulatory Visit (INDEPENDENT_AMBULATORY_CARE_PROVIDER_SITE_OTHER): Payer: Medicare Other

## 2020-06-01 ENCOUNTER — Other Ambulatory Visit: Payer: Self-pay

## 2020-06-01 DIAGNOSIS — Z7901 Long term (current) use of anticoagulants: Secondary | ICD-10-CM

## 2020-06-01 DIAGNOSIS — I4891 Unspecified atrial fibrillation: Secondary | ICD-10-CM

## 2020-06-01 LAB — POCT INR: INR: 3.5 — AB (ref 2.0–3.0)

## 2020-06-01 NOTE — Patient Instructions (Signed)
Hold tomorrow and then Continue with 2 tablets daily Repeat INR in 6 weeks.

## 2020-06-14 ENCOUNTER — Other Ambulatory Visit: Payer: Self-pay | Admitting: Hematology

## 2020-06-14 DIAGNOSIS — C50411 Malignant neoplasm of upper-outer quadrant of right female breast: Secondary | ICD-10-CM

## 2020-06-14 DIAGNOSIS — Z170 Estrogen receptor positive status [ER+]: Secondary | ICD-10-CM

## 2020-07-11 ENCOUNTER — Other Ambulatory Visit: Payer: Self-pay | Admitting: Family Medicine

## 2020-07-13 ENCOUNTER — Other Ambulatory Visit: Payer: Self-pay

## 2020-07-13 ENCOUNTER — Ambulatory Visit (INDEPENDENT_AMBULATORY_CARE_PROVIDER_SITE_OTHER): Payer: Medicare Other

## 2020-07-13 DIAGNOSIS — Z7901 Long term (current) use of anticoagulants: Secondary | ICD-10-CM

## 2020-07-13 DIAGNOSIS — I4891 Unspecified atrial fibrillation: Secondary | ICD-10-CM

## 2020-07-13 LAB — POCT INR: INR: 2.8 (ref 2.0–3.0)

## 2020-07-13 NOTE — Patient Instructions (Signed)
Continue with 2 tablets daily Repeat INR in 6 weeks  

## 2020-07-20 DIAGNOSIS — H524 Presbyopia: Secondary | ICD-10-CM | POA: Diagnosis not present

## 2020-07-20 DIAGNOSIS — H02055 Trichiasis without entropian left lower eyelid: Secondary | ICD-10-CM | POA: Diagnosis not present

## 2020-07-20 DIAGNOSIS — H04123 Dry eye syndrome of bilateral lacrimal glands: Secondary | ICD-10-CM | POA: Diagnosis not present

## 2020-07-20 DIAGNOSIS — H02052 Trichiasis without entropian right lower eyelid: Secondary | ICD-10-CM | POA: Diagnosis not present

## 2020-08-01 ENCOUNTER — Telehealth: Payer: Medicare Other

## 2020-08-08 ENCOUNTER — Other Ambulatory Visit: Payer: Self-pay | Admitting: Cardiology

## 2020-08-15 ENCOUNTER — Ambulatory Visit (INDEPENDENT_AMBULATORY_CARE_PROVIDER_SITE_OTHER): Payer: Medicare Other | Admitting: Pharmacist

## 2020-08-15 DIAGNOSIS — I4891 Unspecified atrial fibrillation: Secondary | ICD-10-CM | POA: Diagnosis not present

## 2020-08-15 DIAGNOSIS — I10 Essential (primary) hypertension: Secondary | ICD-10-CM | POA: Diagnosis not present

## 2020-08-15 DIAGNOSIS — J449 Chronic obstructive pulmonary disease, unspecified: Secondary | ICD-10-CM | POA: Diagnosis not present

## 2020-08-15 NOTE — Progress Notes (Signed)
Chronic Care Management Pharmacy Note  08/22/2020 Name:  Teresa Frey MRN:  [id number] DOB:  Mar 10, 1930  Subjective: Teresa Frey is an 87 y.o. year old female who is a primary patient of [name], Malka So, MD.  The CCM team was consulted for assistance with disease management and care coordination needs.    Engaged with patient by telephone for follow up visit in response to provider referral for pharmacy case management and/or care coordination services.   Consent to Services:  The patient was given information about Chronic Care Management services, agreed to services, and gave verbal consent prior to initiation of services.  Please see initial visit note for detailed documentation.   Patient Care Team: [name], [name], MD as PCP - General (Family Medicine) [name], MD as PCP - Cardiology (Cardiology) Fanny Skates, MD as Consulting Physician (General Surgery) Truitt Merle, MD as Consulting Physician (Hematology) Gery Pray, MD as Consulting Physician (Radiation Oncology) Delice Bison Charlestine Massed, NP as Nurse Practitioner (Hematology and Oncology) Viona Gilmore, [facility] as Pharmacist (Pharmacist)  Recent office visits: 03/22/20 Betty [name], MD: Patient presented for COPD follow up. Influenza vaccine administered.  09/21/19 Visit w/Dr. [name]: Presented for AWV and chronic disease management. No medication changes were made.  Recent consult visits: 07/13/20 Frederik Schmidt, RN (cardiology): Patient presented for anti-coag visit. INR 2.8, goal 2-3. Continue with 10 mg daily and re-check in 6 weeks.  05/16/20 Truitt Merle, MD (oncology): Patient presented for breast cancer follow up. BP elevated in office.  12/02/19 Dr Erlinda Hong (ortho): Presented for left thigh/knee pain. Pt has taken Tylenol without significant relief. Started Medrol dosepak.  Hospital visits: None in previous 6 months  Objective:  Lab Results  Component Value Date   CREATININE 0.91 05/16/2020   BUN 18  05/16/2020   GFR 80.43 03/06/2017   GFRNONAA >60 05/16/2020   GFRAA >60 11/13/2019   NA 146 (H) 05/16/2020   K 3.9 05/16/2020   CALCIUM 10.4 (H) 05/16/2020   CO2 33 (H) 05/16/2020    Lab Results  Component Value Date/Time   GFR 80.43 03/06/2017 11:55 AM   GFR 79.45 10/06/2015 08:46 AM    Last diabetic Eye exam: No results found for: HMDIABEYEEXA  Last diabetic Foot exam: No results found for: HMDIABFOOTEX   Lab Results  Component Value Date   CHOL 200 10/06/2015   HDL 53.20 10/06/2015   LDLCALC 121 (H) 10/06/2015   LDLDIRECT 126.1 06/12/2013   TRIG 133.0 10/06/2015   CHOLHDL 4 10/06/2015    Hepatic Function Latest Ref Rng & Units 05/16/2020 11/13/2019 01/16/2019  Total Protein 6.5 - 8.1 g/dL 7.1 7.5 6.7  Albumin 3.5 - 5.0 g/dL 3.8 4.0 3.4(L)  AST 15 - 41 U/L _0 ALT 0 - 44 U/L _1 Alk Phosphatase 38 - 126 U/L 211(H) 254(H) 202(H)  Total Bilirubin 0.3 - 1.2 mg/dL 0.5 0.4 0.3  Bilirubin, Direct 0.1 - 0.5 mg/dL - - -    Lab Results  Component Value Date/Time   TSH 1.66 03/06/2017 11:55 AM   TSH 1.54 10/06/2015 08:46 AM    CBC Latest Ref Rng & Units 05/16/2020 11/13/2019 01/16/2019  WBC 4.0 - 10.5 K/uL 9.0 11.5(H) 9.5  Hemoglobin 12.0 - 15.0 g/dL 14.4 15.9(H) 13.5  Hematocrit 36.0 - 46.0 % 45.1 49.0(H) 43.1  Platelets 150 - 400 K/uL 187 216 224    Lab Results  Component Value Date/Time   VD25OH 45.97 05/16/2020 09:58 AM  Clinical ASCVD: No  The ASCVD Risk score Mikey Bussing DC Jr., et al., 2013) failed to calculate for the following reasons:   The 2013 ASCVD risk score is only valid for ages 41 to 24    Depression screen PHQ 2/9 09/21/2019 02/24/2018 11/20/2016  Decreased Interest 0 0 0  Down, Depressed, Hopeless 0 0 0  PHQ - 2 Score 0 0 0  Some recent data might be hidden     CHA2DS2/VAS Stroke Risk Points  Current as of 4 days ago     4 >= 2 Points: High Risk  1 - 1.99 Points: Medium Risk  0 Points: Low Risk    Last Change: N/A      Details     This score determines the patient's risk of having a stroke if the  patient has atrial fibrillation.       Points Metrics  0 Has Congestive Heart Failure:  No    Current as of 4 days ago  0 Has Vascular Disease:  No    Current as of 4 days ago  1 Has Hypertension:  Yes    Current as of 4 days ago  2 Age:  86    Current as of 4 days ago  0 Has Diabetes:  No    Current as of 4 days ago  0 Had Stroke:  No  Had TIA:  No  Had Thromboembolism:  No    Current as of 4 days ago  1 Female:  Yes    Current as of 4 days ago        Social History   Tobacco Use  Smoking Status Former Smoker  . Packs/day: 1.00  . Years: 27.00  . Pack years: 27.00  . Types: Cigarettes  . Quit date: 06/04/1978  . Years since quitting: 42.2  Smokeless Tobacco Never Used   BP Readings from Last 3 Encounters:  05/16/20 (!) 147/64  03/22/20 126/80  11/13/19 (!) 141/58   Pulse Readings from Last 3 Encounters:  05/16/20 (!) 54  03/22/20 68  11/13/19 (!) 55   Wt Readings from Last 3 Encounters:  05/16/20 159 lb 9.6 oz (72.4 kg)  03/22/20 158 lb (71.7 kg)  11/13/19 154 lb 12.8 oz (70.2 kg)    Assessment/Interventions: Review of patient past medical history, allergies, medications, health status, including review of consultants reports, laboratory and other test data, was performed as part of comprehensive evaluation and provision of chronic care management services.   SDOH:  (Social Determinants of Health) assessments and interventions performed: No   CCM Care Plan  Allergies  Allergen Reactions  . Hydrocodone-Acetaminophen   . Tramadol     Loss of control    Medications Reviewed Today    Reviewed by Truitt Merle, MD (Physician) on 05/16/20 at 1815  Med List Status: <None>  Medication Order Taking? Sig Documenting Provider Last Dose Status Informant  acetaminophen (TYLENOL) 325 MG tablet [id number] Yes Take 1-2 tablets (325-650 mg total) by mouth every 6 (six) hours as needed for mild pain (pain  score 1-3 or temp > 100.5). Hosie Poisson, MD Taking Active   albuterol (VENTOLIN HFA) 108 (90 Base) MCG/ACT inhaler [id number] Yes Inhale 2 puffs into the lungs every 6 (six) hours as needed for wheezing or shortness of breath. [name], [name], MD Taking Active   anastrozole (ARIMIDEX) 1 MG tablet [id number] Yes TAKE 1 TABLET BY MOUTH DAILY Truitt Merle, MD Taking Active   Ascorbic Acid (VITAMIN C) 1000 MG tablet  [id number] Yes Take 1,000 mg by mouth daily. [provider] Taking Active Self  BIOTIN 5000 PO [id number] Yes Take 1 tablet by mouth daily. [provider] Taking Active Self  Calcium Carb-Cholecalciferol (CALCIUM 1000 + D PO) [id number] Yes Take 1 tablet by mouth daily. [provider] Taking Active Self  calcium-vitamin D (OSCAL WITH D) 500-200 MG-UNIT tablet [id number] Yes Take 1 tablet by mouth 3 (three) times daily.  Patient taking differently: Take 1 tablet by mouth daily.   Leandrew Koyanagi, MD Taking Active   docusate sodium (COLACE) 100 MG capsule [id number] Yes Take 1 capsule (100 mg total) by mouth 2 (two) times daily. Hosie Poisson, MD Taking Active   hydrochlorothiazide (MICROZIDE) 12.5 MG capsule [id number] Yes Take 1 capsule (12.5 mg total) by mouth daily. [name], [name], MD Taking Active   methylPREDNISolone (MEDROL DOSEPAK) 4 MG TBPK tablet [id number] Yes Use as directed Leandrew Koyanagi, MD Taking Active   metoprolol tartrate (LOPRESSOR) 25 MG tablet [id number] Yes TAKE 1 TABLET BY MOUTH DAILY. [name], MD Taking Active   Multiple Vitamins-Minerals (CENTRUM SILVER ADULT 50+) TABS [id number] Yes Take 1 tablet by mouth daily. [provider] Taking Active Self  omega-3 acid ethyl esters (LOVAZA) 1 g capsule [id number] Yes Take 1 g by mouth daily.  [provider] Taking Active Self  polyethylene glycol (MIRALAX / GLYCOLAX) packet [id number] Yes Take 17 g by mouth daily as needed for mild constipation. Hosie Poisson, MD Taking Active    sertraline (ZOLOFT) 50 MG tablet [id number] Yes TAKE 1 TABLET BY MOUTH DAILY. [name], [name], MD Taking Active   vitamin B-12 (CYANOCOBALAMIN) 1000 MCG tablet [id number] Yes Take 1,000 mcg by mouth daily. [provider] Taking Active Self  warfarin (COUMADIN) 5 MG tablet [id number] Yes TAKE UP TO 2 TABLETS DAILY OR AS DIRECTED. [name], MD Taking Active   zinc sulfate 220 (50 Zn) MG capsule [id number] Yes Take 1 capsule (220 mg total) by mouth daily. Leandrew Koyanagi, MD Taking Active           Patient Active Problem List   Diagnosis Date Noted  . Multiple falls 11/13/2019  . COPD (chronic obstructive pulmonary disease) ([place]) 09/21/2019  . Educated about COVID-19 virus infection 09/09/2019  . Spondylosis without myelopathy or radiculopathy, lumbar region 02/12/2019  . Spondylolisthesis of lumbar region 02/12/2019  . Pain of left hip joint 12/23/2018  . Primary osteoarthritis of left knee 12/23/2018  . Periprosthetic fracture around internal prosthetic left hip joint ([place]) 04/19/2018  . Long term (current) use of anticoagulants [Z79.01] 08/14/2017  . Genetic testing 04/01/2016  . possible sleep apnea by history 02/12/2016  . A-fib ([place]) 02/11/2016  . Surgical wound infection 02/11/2016  . Breast cancer of upper-outer quadrant of right female breast ([place]) 01/11/2016  . Impaired glucose tolerance 07/28/2014  . Anxiety disorder, unspecified 04/07/2007  . Osteoarthritis 04/07/2007  . Essential hypertension 04/02/2007  . History of colonic polyps 04/02/2007  . DIVERTICULITIS, HX OF 04/02/2007    Immunization History  Administered Date(s) Administered  . Fluad Quad(high Dose 65+) 03/22/2019, 03/22/2020  . Influenza Split 04/05/2011, 05/07/2012  . Influenza Whole 03/04/2006, 05/06/2007, 02/25/2008, 03/25/2010, 03/21/2013  . Influenza, High Dose Seasonal PF 04/05/2015, 02/29/2016, 04/19/2017, 04/04/2018  . Influenza,inj,Quad PF,6+ Mos 04/07/2014  . Janssen (J&J)  SARS-COV-2 Vaccination 08/18/2019  . Moderna SARS-COV2 Booster Vaccination 04/30/2020  . Pneumococcal Conjugate-13 06/12/2013  . Pneumococcal Polysaccharide-23 03/04/2006  . Tdap 01/24/2015  . Tetanus 06/12/2013  . Zoster  09/25/2013    Conditions to be addressed/monitored:  Hypertension, Atrial Fibrillation, COPD and frequent crying, breast cancer  Care Plan : [place]  Updates made by Viona Gilmore, [place] since 08/22/2020 12:00 AM    Problem: Problem: Hypertension, Atrial Fibrillation, COPD and frequent crying, breast cancer     Long-Range Goal: Patient-Specific Goal   Start Date: 08/15/2020  Expected End Date: 08/15/2021  This Visit's Progress: On track  Priority: High  Note:   Current Barriers:  . Unable to independently monitor therapeutic efficacy  Pharmacist Clinical Goal(s):  Marland Kitchen Patient will achieve adherence to monitoring guidelines and medication adherence to achieve therapeutic efficacy through collaboration with PharmD and provider.   Interventions: . 1:1 collaboration with [name], [name], MD regarding development and update of comprehensive plan of care as evidenced by provider attestation and co-signature . Inter-disciplinary care team collaboration (see longitudinal plan of care) . Comprehensive medication review performed; medication list updated in electronic medical record  Hypertension (BP goal <140/90) -Controlled -Current treatment:  Hydrochlorothiazide 12.5 mg 1 tablet daily  Metoprolol 1 tablet 25 mg daily -Medications previously tried: none  -Current home readings: 125/73 HR 55 (checks 2-3 times a week) -Current dietary habits: eats out or eats frozen meals -Current exercise habits: limited exercise as she ambulates with a cane ; doing leg exercises from when she broke her femur (every day) -Denies hypotensive/hypertensive symptoms -Educated on Exercise goal of 150 minutes per week; Importance of home blood pressure  monitoring; Proper BP monitoring technique; -Counseled to monitor BP at home weekly, document, and provide log at future appointments -Recommended to continue current medication  Atrial Fibrillation (Goal: prevent stroke and major bleeding) -Controlled -CHADSVASC: 4 -Current treatment:  Rate control: Metoprolol tartrate 25 mg daily  Anticoagulation: Warfarin 10 mg daily -Medications previously tried: none -Home BP and HR readings: 125/73 HR 55 -Counseled on bleeding risk associated with warfarin and importance of self-monitoring for signs/symptoms of bleeding; -Recommended to continue current medication  COPD (Goal: control symptoms and prevent exacerbations) -Controlled -Current treatment  . Albuterol 2 puffs every 6 hours as needed for shortness of breath -Medications previously tried: nebulizer  -Current COPD Classification:  A (low sx, <2 exacerbations/yr) -MMRC/CAT score: n/a -Pulmonary function testing: n/a -Exacerbations requiring treatment in last 6 months: none -Patient denies consistent use of maintenance inhaler -Frequency of rescue inhaler use: depends on exertion; might not use it for a week or two at a time -Counseled on Proper inhaler technique; When to use rescue inhaler -Recommended to continue current medication   Frequency crying (Goal: minimize symptoms) -Controlled -Current treatment: . Sertraline 50 mg daily -Medications previously tried/failed: none -PHQ9: 0 -Educated on Benefits of medication for symptom control -Recommended to continue current medication   Breast cancer (Goal: remission) -Controlled -Current treatment  . Anastrazole 1 mg 1 tablet daily -Medications previously tried: none  -Recommended to continue current medication  Osteopenia (Goal prevent fractures) -Controlled -Last DEXA Scan: n/a   T-Score femoral neck: n/a  T-Score total hip: -1.1  T-Score lumbar spine: n/a  T-Score forearm radius: -1.1  10-year probability of  major osteoporotic fracture: n/a  10-year probability of hip fracture: n/a -Patient is not a candidate for pharmacologic treatment -Current treatment  . Calcium 1000 mg & D3 1000 units once daily -Medications previously tried: none  -Recommend [phone] units of vitamin D daily. Recommend 1200 mg of calcium daily from dietary and supplemental sources. Recommend weight-bearing and muscle strengthening exercises for building and maintaining bone density. -  Recommended to continue current medication   Health Maintenance -Vaccine gaps: shingrix -Current therapy:   Tylenol 325 mg as needed (typically once every AM)  Vitamin C 2000 mg daily  Biotin 5000 daily  Centrum silver daily  Vitamin B-12 1000 mcg daily  Fish oil 1 g daily -Educated on Cost vs benefit of each product must be carefully weighed by individual consumer -Patient is satisfied with current therapy and denies issues -Recommended to continue current medication  Patient Goals/Self-Care Activities . Patient will:  - take medications as prescribed check blood pressure weekly, document, and provide at future appointments target a minimum of 150 minutes of moderate intensity exercise weekly  Follow Up Plan: Telephone follow up appointment with care management team member scheduled for: 6 months       Medication Assistance: None required.  Patient affirms current coverage meets needs.  Patient's preferred pharmacy is:  [facility], [address] [place] [address] Phone: [phone] Fax: [phone]  [facility] [phone] - [place], [place] - [address] [place] [address] Phone: [phone] Fax: [phone]  Uses pill box? No - uses her shelf and takes all medicines at once Pt endorses 100% compliance  We discussed: Current pharmacy is preferred with insurance plan and patient  is satisfied with pharmacy services Patient decided to: Continue current medication management strategy  Care Plan and Follow Up Patient Decision:  Patient agrees to Care Plan and Follow-up.  Plan: Telephone follow up appointment with care management team member scheduled for:  6 months  Jeni Salles, PharmD [facility] Clinical Pharmacist [place] at [address] [phone]   6 months -

## 2020-08-24 ENCOUNTER — Other Ambulatory Visit: Payer: Self-pay

## 2020-08-24 ENCOUNTER — Ambulatory Visit (INDEPENDENT_AMBULATORY_CARE_PROVIDER_SITE_OTHER): Payer: Medicare Other | Admitting: Pharmacist

## 2020-08-24 DIAGNOSIS — I4891 Unspecified atrial fibrillation: Secondary | ICD-10-CM | POA: Diagnosis not present

## 2020-08-24 DIAGNOSIS — Z7901 Long term (current) use of anticoagulants: Secondary | ICD-10-CM

## 2020-08-24 LAB — POCT INR: INR: 1.8 — AB (ref 2.0–3.0)

## 2020-08-25 ENCOUNTER — Other Ambulatory Visit: Payer: Self-pay | Admitting: Cardiology

## 2020-09-05 ENCOUNTER — Telehealth: Payer: Self-pay | Admitting: Family Medicine

## 2020-09-05 NOTE — Telephone Encounter (Signed)
Left message for patient to call back and schedule Medicare Annual Wellness Visit (AWV) either virtually or in office.   Last AWV 09/20/20  please schedule at anytime with [facility] Nurse Health Advisor 1 or 2   This should be a 45 minute visit.

## 2020-09-13 ENCOUNTER — Other Ambulatory Visit: Payer: Self-pay | Admitting: Family Medicine

## 2020-09-23 DIAGNOSIS — L821 Other seborrheic keratosis: Secondary | ICD-10-CM | POA: Diagnosis not present

## 2020-09-23 DIAGNOSIS — B079 Viral wart, unspecified: Secondary | ICD-10-CM | POA: Diagnosis not present

## 2020-09-28 DIAGNOSIS — I4891 Unspecified atrial fibrillation: Secondary | ICD-10-CM | POA: Insufficient documentation

## 2020-09-28 NOTE — Progress Notes (Signed)
Cardiology Office Note   Date:  09/29/2020   ID:  Teresa Frey, DOB 1930/02/22, MRN [id number]  PCP:  [name], [name], MD  Cardiologist:   [name], MD   Chief Complaint  Patient presents with  . Atrial Fibrillation      History of Present Illness: Teresa Frey is a 87 y.o. female who presents for follow up of atrial fib.  Since I saw her she has done well.  She gets a "uneasiness" sometimes in her chest and wonders if this could be the fibrillation but is very brief lived.  She does not have any sustained palpitations.  She does not have any chest pressure, neck or arm discomfort.  She had no new shortness of breath, PND or orthopnea.  She walks slowly with a walker outside of the house or cane.  Inside the house she just is very careful and has had no falls in 1 year.  She finally retired completely.   Past Medical History:  Diagnosis Date  . ANXIETY 04/07/2007  . Breast cancer of upper-outer quadrant of right female breast ([place]) 01/11/2016  . COLONIC POLYPS, HX OF 04/02/2007   adenomatous  . DIVERTICULITIS, HX OF 04/02/2007  . Diverticulosis   . DJD (degenerative joint disease)   . Hemorrhoids   . HYPERTENSION 04/02/2007  . OSTEOARTHRITIS, SEVERE 04/07/2007  . 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 WEIGHT GAIN 11/13/2007    Past Surgical History:  Procedure Laterality Date  . APPENDECTOMY    . BREAST LUMPECTOMY WITH RADIOACTIVE SEED LOCALIZATION Right 02/08/2016   Procedure: RIGHT BREAST LUMPECTOMY WITH RADIOACTIVE SEED LOCALIZATION;  Surgeon: Fanny Skates, MD;  Location: [facility];  Service: General;  Laterality: Right;  . ORIF FEMUR FRACTURE Left 04/21/2018   Procedure: OPEN REDUCTION INTERNAL FIXATION (ORIF) PERI PROSTHETIC FEMUR FRACTURE;  Surgeon: Leandrew Koyanagi, MD;  Location: [facility];  Service: Orthopedics;  Laterality: Left;  . TONSILLECTOMY    . TOTAL HIP ARTHROPLASTY  1999, left hip    2006 right hip  . TUBAL LIGATION        Current Outpatient Medications  Medication Sig Dispense Refill  . acetaminophen (TYLENOL) 325 MG tablet Take 1-2 tablets (325-650 mg total) by mouth every 6 (six) hours as needed for mild pain (pain score 1-3 or temp > 100.5).    Marland Kitchen albuterol (VENTOLIN HFA) 108 (90 Base) MCG/ACT inhaler Inhale 2 puffs into the lungs every 6 (six) hours as needed for wheezing or shortness of breath. 18 g 2  . anastrozole (ARIMIDEX) 1 MG tablet TAKE 1 TABLET BY MOUTH DAILY 90 tablet 3  . Ascorbic Acid (VITAMIN C) 1000 MG tablet Take 1,000 mg by mouth daily.    Marland Kitchen BIOTIN 5000 PO Take 1 tablet by mouth daily.    . Calcium Carb-Cholecalciferol (CALCIUM 1000 + D PO) Take 1 tablet by mouth daily.    . calcium-vitamin D (OSCAL WITH D) 500-200 MG-UNIT tablet Take 1 tablet by mouth 3 (three) times daily. (Patient taking differently: Take 1 tablet by mouth daily.) 90 tablet 12  . docusate sodium (COLACE) 100 MG capsule Take 1 capsule (100 mg total) by mouth 2 (two) times daily. 10 capsule 0  . hydrochlorothiazide (MICROZIDE) 12.5 MG capsule TAKE 1 CAPSULE (12.5 MG TOTAL) BY MOUTH DAILY. 90 capsule 1  . methylPREDNISolone (MEDROL DOSEPAK) 4 MG TBPK tablet Use as directed 21 tablet 0  . metoprolol tartrate (LOPRESSOR) 25 MG tablet  TAKE 1 TABLET BY MOUTH DAILY. 90 tablet 1  . Multiple Vitamins-Minerals (CENTRUM SILVER ADULT 50+) TABS Take 1 tablet by mouth daily.    Marland Kitchen omega-3 acid ethyl esters (LOVAZA) 1 g capsule Take 1 g by mouth daily.     . polyethylene glycol (MIRALAX / GLYCOLAX) packet Take 17 g by mouth daily as needed for mild constipation. 14 each 0  . sertraline (ZOLOFT) 50 MG tablet TAKE 1 TABLET BY MOUTH DAILY. 90 tablet 1  . vitamin B-12 (CYANOCOBALAMIN) 1000 MCG tablet Take 1,000 mcg by mouth daily.    Marland Kitchen warfarin (COUMADIN) 5 MG tablet TAKE UP TO 2 TABLETS DAILY OR AS DIRECTED. 180 tablet 1  . zinc sulfate 220 (50 Zn) MG capsule Take 1 capsule (220 mg total) by mouth daily. 42 capsule 0   No current  facility-administered medications for this visit.    Allergies:   Hydrocodone-acetaminophen and Tramadol    ROS:  Please see the history of present illness.   Otherwise, review of systems are positive for none.   All other systems are reviewed and negative.    PHYSICAL EXAM: VS:  BP 125/71   Pulse (!) 58   Ht 5\' 3"  (1.6 m)   Wt 163 lb 6.4 oz (74.1 kg)   SpO2 98%   BMI 28.95 kg/m  , BMI Body mass index is 28.95 kg/m. GENERAL:  Well appearing NECK:  No jugular venous distention, waveform within normal limits, carotid upstroke brisk and symmetric, no bruits, no thyromegaly LUNGS:  Clear to auscultation bilaterally CHEST:  Unremarkable HEART:  PMI not displaced or sustained,S1 and S2 within normal limits, no S3, no S4, no clicks, no rubs, no murmurs ABD:  Flat, positive bowel sounds normal in frequency in pitch, no bruits, no rebound, no guarding, no midline pulsatile mass, no hepatomegaly, no splenomegaly EXT:  2 plus pulses throughout, no edema, no cyanosis no clubbing  EKG:  EKG is ordered today. Sinus rhythm, rate 58, axis within normal limits, intervals within normal limits, no acute ST-T wave changes.   Recent Labs: 05/16/2020: ALT 13; BUN 18; Creatinine 0.91; Hemoglobin 14.4; Platelet Count 187; Potassium 3.9; Sodium 146    Lipid Panel    Component Value Date/Time   CHOL 200 10/06/2015 0846   TRIG 133.0 10/06/2015 0846   TRIG 109 04/02/2006 1019   HDL 53.20 10/06/2015 0846   CHOLHDL 4 10/06/2015 0846   VLDL 26.6 10/06/2015 0846   LDLCALC 121 (H) 10/06/2015 0846   LDLDIRECT 126.1 06/12/2013 0949      Wt Readings from Last 3 Encounters:  09/29/20 163 lb 6.4 oz (74.1 kg)  05/16/20 159 lb 9.6 oz (72.4 kg)  03/22/20 158 lb (71.7 kg)      Other studies Reviewed: Additional studies/ records that were reviewed today include:Labs Review of the above records demonstrates:   See elsewhere   ASSESSMENT AND PLAN:   Atrial Fib:  [name] has a CHA2DS2 -  VASc score of 4.  She might have some very brief paroxysms but these are nonsustained.  She will let me know if she has any increasing chest symptoms which might need monitoring.  She could not afford DOAC and so she wants to stay on her warfarin.  Blood work in December was unremarkable including normal CBC.  No change in therapy.    Current medicines are reviewed at length with the patient today.  The patient has concerns regarding medicines.  The following changes have been made:  None  Labs/ tests ordered today include: She had her INR today  No orders of the defined types were placed in this encounter.    Disposition:   FU with me in 18 months.     Signed, [name], MD  09/29/2020 10:24 AM    [facility]

## 2020-09-29 ENCOUNTER — Encounter: Payer: Self-pay | Admitting: Cardiology

## 2020-09-29 ENCOUNTER — Ambulatory Visit (INDEPENDENT_AMBULATORY_CARE_PROVIDER_SITE_OTHER): Payer: Medicare Other | Admitting: Pharmacist Clinician (PhC)/ Clinical Pharmacy Specialist

## 2020-09-29 ENCOUNTER — Ambulatory Visit: Payer: Medicare Other | Admitting: Cardiology

## 2020-09-29 ENCOUNTER — Other Ambulatory Visit: Payer: Self-pay

## 2020-09-29 VITALS — BP 125/71 | HR 58 | Ht 63.0 in | Wt 163.4 lb

## 2020-09-29 DIAGNOSIS — Z7901 Long term (current) use of anticoagulants: Secondary | ICD-10-CM

## 2020-09-29 DIAGNOSIS — I4891 Unspecified atrial fibrillation: Secondary | ICD-10-CM

## 2020-09-29 LAB — POCT INR: INR: 2.6 (ref 2.0–3.0)

## 2020-09-29 NOTE — Patient Instructions (Signed)
Medication Instructions:  Continue current medications  *If you need a refill on your cardiac medications before your next appointment, please call your pharmacy*   Lab Work: None Ordered   Testing/Procedures: None Ordered   Follow-Up: At [facility], you and your health needs are our priority.  As part of our continuing mission to provide you with exceptional heart care, we have created designated Provider Care Teams.  These Care Teams include your primary Cardiologist (physician) and Advanced Practice Providers (APPs -  Physician Assistants and Nurse Practitioners) who all work together to provide you with the care you need, when you need it.  We recommend signing up for the patient portal called "MyChart".  Sign up information is provided on this After Visit Summary.  MyChart is used to connect with patients for Virtual Visits (Telemedicine).  Patients are able to view lab/test results, encounter notes, upcoming appointments, etc.  Non-urgent messages can be sent to your provider as well.   To learn more about what you can do with MyChart, go to [email or web address].    Your next appointment:   18 month(s)  The format for your next appointment:   In Person  Provider:   You may see James Hochrein, MD or one of the following Advanced Practice Providers on your designated Care Team:    Rhonda Barrett, PA-C  Kathryn Lawrence, DNP, ANP     

## 2020-09-30 ENCOUNTER — Ambulatory Visit (INDEPENDENT_AMBULATORY_CARE_PROVIDER_SITE_OTHER): Payer: Medicare Other

## 2020-09-30 VITALS — BP 122/68 | HR 68 | Temp 98.0°F | Wt 160.0 lb

## 2020-09-30 DIAGNOSIS — Z0000 Encounter for general adult medical examination without abnormal findings: Secondary | ICD-10-CM

## 2020-09-30 NOTE — Progress Notes (Signed)
Subjective:   Teresa Frey is a 87 y.o. female who presents for Medicare Annual (Subsequent) preventive examination.  Review of Systems    N/a  Cardiac Risk Factors include: advanced age (>29men, >19 women);hypertension;dyslipidemia     Objective:    Today's Vitals   09/30/20 1135  BP: 122/68  Pulse: 68  Temp: 98 F (36.7 C)  SpO2: 92%  Weight: 160 lb (72.6 kg)   Body mass index is 28.34 kg/m.  Advanced Directives 09/30/2020 11/13/2019 04/12/2019 04/20/2018 04/19/2018 04/19/2018 10/18/2017  Does Patient Have a Medical Advance Directive? Yes Yes Yes Yes Yes Yes No  Type of Paramedic of [place];Living will - - Healthcare Power of [place] - -  Does patient want to make changes to medical advance directive? No - Patient declined No - Patient declined - No - Patient declined - - -  Copy of [place] in Chart? No - copy requested - - - - - -  Would patient like information on creating a medical advance directive? - - - - - - -    Current Medications (verified) Outpatient Encounter Medications as of 09/30/2020  Medication Sig  . acetaminophen (TYLENOL) 325 MG tablet Take 1-2 tablets (325-650 mg total) by mouth every 6 (six) hours as needed for mild pain (pain score 1-3 or temp > 100.5).  Marland Kitchen albuterol (VENTOLIN HFA) 108 (90 Base) MCG/ACT inhaler Inhale 2 puffs into the lungs every 6 (six) hours as needed for wheezing or shortness of breath.  . anastrozole (ARIMIDEX) 1 MG tablet TAKE 1 TABLET BY MOUTH DAILY  . Ascorbic Acid (VITAMIN C) 1000 MG tablet Take 1,000 mg by mouth daily.  Marland Kitchen BIOTIN 5000 PO Take 1 tablet by mouth daily.  . Calcium Carb-Cholecalciferol (CALCIUM 1000 + D PO) Take 1 tablet by mouth daily.  . calcium-vitamin D (OSCAL WITH D) 500-200 MG-UNIT tablet Take 1 tablet by mouth 3 (three) times daily. (Patient taking differently: Take 1 tablet by mouth daily.)  . hydrochlorothiazide (MICROZIDE) 12.5  MG capsule TAKE 1 CAPSULE (12.5 MG TOTAL) BY MOUTH DAILY.  . metoprolol tartrate (LOPRESSOR) 25 MG tablet TAKE 1 TABLET BY MOUTH DAILY.  . Multiple Vitamins-Minerals (CENTRUM SILVER ADULT 50+) TABS Take 1 tablet by mouth daily.  Marland Kitchen omega-3 acid ethyl esters (LOVAZA) 1 g capsule Take 1 g by mouth daily.   . sertraline (ZOLOFT) 50 MG tablet TAKE 1 TABLET BY MOUTH DAILY.  Marland Kitchen warfarin (COUMADIN) 5 MG tablet TAKE UP TO 2 TABLETS DAILY OR AS DIRECTED.  Marland Kitchen docusate sodium (COLACE) 100 MG capsule Take 1 capsule (100 mg total) by mouth 2 (two) times daily. (Patient not taking: Reported on 09/30/2020)  . methylPREDNISolone (MEDROL DOSEPAK) 4 MG TBPK tablet Use as directed (Patient not taking: Reported on 09/30/2020)  . polyethylene glycol (MIRALAX / GLYCOLAX) packet Take 17 g by mouth daily as needed for mild constipation. (Patient not taking: Reported on 09/30/2020)  . vitamin B-12 (CYANOCOBALAMIN) 1000 MCG tablet Take 1,000 mcg by mouth daily. (Patient not taking: Reported on 09/30/2020)  . zinc sulfate 220 (50 Zn) MG capsule Take 1 capsule (220 mg total) by mouth daily. (Patient not taking: Reported on 09/30/2020)   No facility-administered encounter medications on file as of 09/30/2020.    Allergies (verified) Hydrocodone-acetaminophen and Tramadol   History: Past Medical History:  Diagnosis Date  . ANXIETY 04/07/2007  . Breast cancer of upper-outer quadrant of right female breast ([facility]) 01/11/2016  .  COLONIC POLYPS, HX OF 04/02/2007   adenomatous  . DIVERTICULITIS, HX OF 04/02/2007  . Diverticulosis   . DJD (degenerative joint disease)   . Hemorrhoids   . HYPERTENSION 04/02/2007  . OSTEOARTHRITIS, SEVERE 04/07/2007  . 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 WEIGHT GAIN 11/13/2007   Past Surgical History:  Procedure Laterality Date  . APPENDECTOMY    . BREAST LUMPECTOMY WITH RADIOACTIVE SEED LOCALIZATION Right 02/08/2016   Procedure: RIGHT BREAST LUMPECTOMY WITH RADIOACTIVE SEED LOCALIZATION;   Surgeon: Fanny Skates, MD;  Location: [facility];  Service: General;  Laterality: Right;  . ORIF FEMUR FRACTURE Left 04/21/2018   Procedure: OPEN REDUCTION INTERNAL FIXATION (ORIF) PERI PROSTHETIC FEMUR FRACTURE;  Surgeon: Leandrew Koyanagi, MD;  Location: [facility];  Service: Orthopedics;  Laterality: Left;  . TONSILLECTOMY    . TOTAL HIP ARTHROPLASTY  1999, left hip    2006 right hip  . TUBAL LIGATION     Family History  Problem Relation Age of Onset  . Pancreatic cancer Mother 64       d. 52  . Cancer Father 12       hodgkin's lymphoma and leukemia; d. 13  . Colon cancer Brother        dx. late 60s; smoker  . Pancreatic cancer Brother 80       d. 4; smoker  . Lung cancer Brother 40       smoker  . Heart attack Brother        d. late 52s  . Colon cancer Paternal Aunt   . Breast cancer Daughter 22       negative genetic testing in 2016  . Skin cancer Daughter        non-melanoma type; +sun exposure  . Kidney failure Son 25       +EtOH abuse resulting in liver, kidney, and pancreas failure  . Diabetes Maternal Uncle        d. later age  . Heart disease Neg Hx        family  . Esophageal cancer Neg Hx   . Rectal cancer Neg Hx   . Stomach cancer Neg Hx    Social History   Socioeconomic History  . Marital status: Married    Spouse name: Not on file  . Number of children: 2  . Years of education: Not on file  . Highest education level: Not on file  Occupational History  . Not on file  Tobacco Use  . Smoking status: Former Smoker    Packs/day: 1.00    Years: 27.00    Pack years: 27.00    Types: Cigarettes    Quit date: 06/04/1978    Years since quitting: 42.3  . Smokeless tobacco: Never Used  Vaping Use  . Vaping Use: Never used  Substance and Sexual Activity  . Alcohol use: Yes    Alcohol/week: 7.0 standard drinks    Types: 7 Glasses of wine per week    Comment: 1-2 glasses wine after dinner, but not necessarily each day  . Drug use: No  . Sexual  activity: Not on file  Other Topics Concern  . Not on file  Social History Narrative  . Not on file   Social Determinants of Health   Financial Resource Strain: Low Risk   . Difficulty of Paying Living Expenses: Not hard at all  Food Insecurity: No Food Insecurity  . Worried About Charity fundraiser in  the Last Year: Never true  . Ran Out of Food in the Last Year: Never true  Transportation Needs: No Transportation Needs  . Lack of Transportation (Medical): No  . Lack of Transportation (Non-Medical): No  Physical Activity: Unknown  . Days of Exercise per Week: 5 days  . Minutes of Exercise per Session: Not on file  Stress: No Stress Concern Present  . Feeling of Stress : Not at all  Social Connections: Moderately Isolated  . Frequency of Communication with Friends and Family: More than three times a week  . Frequency of Social Gatherings with Friends and Family: More than three times a week  . Attends Religious Services: 1 to 4 times per year  . Active Member of Clubs or Organizations: No  . Attends Archivist Meetings: Never  . Marital Status: Widowed    Tobacco Counseling Counseling given: Not Answered   Clinical Intake:  Pre-visit preparation completed: Yes  Pain : No/denies pain     Nutritional Risks: None Diabetes: No  How often do you need to have someone help you when you read instructions, pamphlets, or other written materials from your doctor or pharmacy?: 1 - Never What is the last grade level you completed in school?: High school  Diabetic?no  Interpreter Needed?: No  Information entered by :: L.Alfonzia Woolum,LPN   Activities of Daily Living In your present state of health, do you have any difficulty performing the following activities: 09/30/2020  Hearing? N  Vision? N  Difficulty concentrating or making decisions? N  Walking or climbing stairs? N  Dressing or bathing? N  Doing errands, shopping? N  Preparing Food and eating ? N  Using the  Toilet? N  In the past six months, have you accidently leaked urine? N  Do you have problems with loss of bowel control? N  Managing your Medications? N  Managing your Finances? N  Housekeeping or managing your Housekeeping? N  Some recent data might be hidden    Patient Care Team: [name], [name], MD as PCP - General (Family Medicine) [name], MD as PCP - Cardiology (Cardiology) Fanny Skates, MD as Consulting Physician (General Surgery) Truitt Merle, MD as Consulting Physician (Hematology) Gery Pray, MD as Consulting Physician ([place]) Delice Bison Charlestine Massed, NP as Nurse Practitioner (Hematology and Oncology) Viona Gilmore, [facility] as Pharmacist (Pharmacist)  Indicate any recent Medical Services you may have received from other than [facility] providers in the past year (date may be approximate).     Assessment:   This is a routine wellness examination for Akshitha.  Hearing/Vision screen  Hearing Screening   125Hz  250Hz  500Hz  1000Hz  2000Hz  3000Hz  4000Hz  6000Hz  8000Hz   Right ear:           Left ear:           Vision Screening Comments: Annual eye exams   Dietary issues and exercise activities discussed: Current Exercise Habits: Home exercise routine, Type of exercise: walking, Time (Minutes): 30, Frequency (Times/Week): 5, Weekly Exercise (Minutes/Week): 150, Intensity: Mild, Exercise limited by: orthopedic condition(s)  Goals    . Exercise 3x per week (30 min per time)    . Prevent falls     Continue daily low impact exercise. Fall precautions. Continue calcium and vit D.       Depression Screen PHQ 2/9 Scores 09/30/2020 09/30/2020 09/21/2019 02/24/2018 11/20/2016 10/07/2015 07/28/2014  PHQ - 2 Score 0 0 0 0 0 0 0    Fall Risk Fall Risk  09/30/2020 03/24/2020  09/21/2019 04/29/2019 11/20/2016  Falls in the past year? 0 0 1 1 No  Comment - - - Emmi Telephone Survey: data to providers prior to load -  Number falls in past yr: 0 0 0 1 -  Comment - - - Emmi  Telephone Survey Actual Response = 2 -  Injury with Fall? 0 0 1 1 -  Risk Factor Category  - - - - -  Risk for fall due to : - - History of fall(s);Impaired balance/gait - -  Follow up - Education provided Education provided - -    FALL RISK PREVENTION PERTAINING TO THE HOME:  Any stairs in or around the home? Yes  If so, are there any without handrails? Yes  Home free of loose throw rugs in walkways, pet beds, electrical cords, etc? Yes  Adequate lighting in your home to reduce risk of falls? Yes   ASSISTIVE DEVICES UTILIZED TO PREVENT FALLS:  Life alert? No  Use of a cane, walker or w/c? Yes  Grab bars in the bathroom? Yes  Shower chair or bench in shower? Yes  Elevated toilet seat or a handicapped toilet? Yes   TIMED UP AND GO:  Was the test performed? Yes .  Length of time to ambulate 10 feet: 10 sec.   Gait steady and fast without use of assistive device  Cognitive Function:        Immunizations Immunization History  Administered Date(s) Administered  . Fluad Quad(high Dose 65+) 03/22/2019, 03/22/2020  . Influenza Split 04/05/2011, 05/07/2012  . Influenza Whole 03/04/2006, 05/06/2007, 02/25/2008, 03/25/2010, 03/21/2013  . Influenza, High Dose Seasonal PF 04/05/2015, 02/29/2016, 04/19/2017, 04/04/2018  . Influenza,inj,Quad PF,6+ Mos 04/07/2014  . Janssen (J&J) SARS-COV-2 Vaccination 08/18/2019  . Moderna SARS-COV2 Booster Vaccination 04/30/2020  . Pneumococcal Conjugate-13 06/12/2013  . Pneumococcal Polysaccharide-23 03/04/2006  . Tdap 01/24/2015  . Tetanus 06/12/2013  . Zoster 09/25/2013    TDAP status: Up to date  Flu Vaccine status: Up to date  Pneumococcal vaccine status: Up to date  Covid-19 vaccine status: Completed vaccines  Qualifies for Shingles Vaccine? Yes   Zostavax completed Yes   Shingrix Completed?: No.    Education has been provided regarding the importance of this vaccine. Patient has been advised to call insurance company to determine  out of pocket expense if they have not yet received this vaccine. Advised may also receive vaccine at local pharmacy or Health Dept. Verbalized acceptance and understanding.  Screening Tests Health Maintenance  Topic Date Due  . INFLUENZA VACCINE  01/02/2021  . MAMMOGRAM  02/22/2021  . TETANUS/TDAP  01/23/2025  . DEXA SCAN  Completed  . COVID-19 Vaccine  Completed  . PNA vac Low Risk Adult  Completed  . HPV VACCINES  Aged Out  . COLONOSCOPY (Pts 45-29yrs Insurance coverage will need to be confirmed)  Discontinued    Health Maintenance  There are no preventive care reminders to display for this patient.  Colorectal cancer screening: No longer required.   Mammogram status: Completed scheduled 0921/22. Repeat every year  Bone Density status: Completed 02/23/2020. Results reflect: Bone density results: OSTEOPENIA. Repeat every 10 years.  Lung Cancer Screening: (Low Dose CT Chest recommended if Age 19-80 years, 30 pack-year currently smoking OR have quit w/in 15years.) does not qualify.   Lung Cancer Screening Referral: n/a  Additional Screening:  Hepatitis C Screening: does not qualify;   Vision Screening: Recommended annual ophthalmology exams for early detection of glaucoma and other disorders of the eye. Is the  patient up to date with their annual eye exam?  Yes  Who is the provider or what is the name of the office in which the patient attends annual eye exams? Dr. Yvonne Kendall  If pt is not established with a provider, would they like to be referred to a provider to establish care? No .   Dental Screening: Recommended annual dental exams for proper oral hygiene  Community Resource Referral / Chronic Care Management: CRR required this visit?  No   CCM required this visit?  No      Plan:     I have personally reviewed and noted the following in the patient's chart:   . Medical and social history . Use of alcohol, tobacco or illicit drugs  . Current medications and  supplements . Functional ability and status . Nutritional status . Physical activity . Advanced directives . List of other physicians . Hospitalizations, surgeries, and ER visits in previous 12 months . Vitals . Screenings to include cognitive, depression, and falls . Referrals and appointments  In addition, I have reviewed and discussed with patient certain preventive protocols, quality metrics, and best practice recommendations. A written personalized care plan for preventive services as well as general preventive health recommendations were provided to patient.     [name], LPN   1/61/0960   Nurse Notes: none

## 2020-09-30 NOTE — Patient Instructions (Signed)
Teresa Frey , Thank you for taking time to come for your Medicare Wellness Visit. I appreciate your ongoing commitment to your health goals. Please review the following plan we discussed and let me know if I can assist you in the future.   Screening recommendations/referrals: Colonoscopy: non longer required Mammogram: due 02/22/2021 Bone Density: current due 02/22/2022 Recommended yearly ophthalmology/optometry visit for glaucoma screening and checkup Recommended yearly dental visit for hygiene and checkup  Vaccinations: Influenza vaccine: current due fall 2022 Pneumococcal vaccine: completed series Tdap vaccine: current  Shingles vaccine: will obtain local pharmacy   Advanced directives: will provide copies   Conditions/risks identified: none   Next appointment: none    Preventive Care 36 Years and Older, Female Preventive care refers to lifestyle choices and visits with your health care provider that can promote health and wellness. What does preventive care include?  A yearly physical exam. This is also called an annual well check.  Dental exams once or twice a year.  Routine eye exams. Ask your health care provider how often you should have your eyes checked.  Personal lifestyle choices, including:  Daily care of your teeth and gums.  Regular physical activity.  Eating a healthy diet.  Avoiding tobacco and drug use.  Limiting alcohol use.  Practicing safe sex.  Taking low-dose aspirin every day.  Taking vitamin and mineral supplements as recommended by your health care provider. What happens during an annual well check? The services and screenings done by your health care provider during your annual well check will depend on your age, overall health, lifestyle risk factors, and family history of disease. Counseling  Your health care provider may ask you questions about your:  Alcohol use.  Tobacco use.  Drug use.  Emotional well-being.  Home and  relationship well-being.  Sexual activity.  Eating habits.  History of falls.  Memory and ability to understand (cognition).  Work and work Statistician.  Reproductive health. Screening  You may have the following tests or measurements:  Height, weight, and BMI.  Blood pressure.  Lipid and cholesterol levels. These may be checked every 5 years, or more frequently if you are over 24 years old.  Skin check.  Lung cancer screening. You may have this screening every year starting at age 22 if you have a 30-pack-year history of smoking and currently smoke or have quit within the past 15 years.  Fecal occult blood test (FOBT) of the stool. You may have this test every year starting at age 82.  Flexible sigmoidoscopy or colonoscopy. You may have a sigmoidoscopy every 5 years or a colonoscopy every 10 years starting at age 66.  Hepatitis C blood test.  Hepatitis B blood test.  Sexually transmitted disease (STD) testing.  Diabetes screening. This is done by checking your blood sugar (glucose) after you have not eaten for a while (fasting). You may have this done every 1-3 years.  Bone density scan. This is done to screen for osteoporosis. You may have this done starting at age 40.  Mammogram. This may be done every 1-2 years. Talk to your health care provider about how often you should have regular mammograms. Talk with your health care provider about your test results, treatment options, and if necessary, the need for more tests. Vaccines  Your health care provider may recommend certain vaccines, such as:  Influenza vaccine. This is recommended every year.  Tetanus, diphtheria, and acellular pertussis (Tdap, Td) vaccine. You may need a Td booster every 10 years.  Zoster vaccine. You may need this after age 70.  Pneumococcal 13-valent conjugate (PCV13) vaccine. One dose is recommended after age 52.  Pneumococcal polysaccharide (PPSV23) vaccine. One dose is recommended after  age 40. Talk to your health care provider about which screenings and vaccines you need and how often you need them. This information is not intended to replace advice given to you by your health care provider. Make sure you discuss any questions you have with your health care provider. Document Released: 06/17/2015 Document Revised: 02/08/2016 Document Reviewed: 03/22/2015 Elsevier Interactive Patient Education  2017 [place] Prevention in the Home Falls can cause injuries. They can happen to people of all ages. There are many things you can do to make your home safe and to help prevent falls. What can I do on the outside of my home?  Regularly fix the edges of walkways and driveways and fix any cracks.  Remove anything that might make you trip as you walk through a door, such as a raised step or threshold.  Trim any bushes or trees on the path to your home.  Use bright outdoor lighting.  Clear any walking paths of anything that might make someone trip, such as rocks or tools.  Regularly check to see if handrails are loose or broken. Make sure that both sides of any steps have handrails.  Any raised decks and porches should have guardrails on the edges.  Have any leaves, snow, or ice cleared regularly.  Use sand or salt on walking paths during winter.  Clean up any spills in your garage right away. This includes oil or grease spills. What can I do in the bathroom?  Use night lights.  Install grab bars by the toilet and in the tub and shower. Do not use towel bars as grab bars.  Use non-skid mats or decals in the tub or shower.  If you need to sit down in the shower, use a plastic, non-slip stool.  Keep the floor dry. Clean up any water that spills on the floor as soon as it happens.  Remove soap buildup in the tub or shower regularly.  Attach bath mats securely with double-sided non-slip rug tape.  Do not have throw rugs and other things on the floor that can  make you trip. What can I do in the bedroom?  Use night lights.  Make sure that you have a light by your bed that is easy to reach.  Do not use any sheets or blankets that are too big for your bed. They should not hang down onto the floor.  Have a firm chair that has side arms. You can use this for support while you get dressed.  Do not have throw rugs and other things on the floor that can make you trip. What can I do in the kitchen?  Clean up any spills right away.  Avoid walking on wet floors.  Keep items that you use a lot in easy-to-reach places.  If you need to reach something above you, use a strong step stool that has a grab bar.  Keep electrical cords out of the way.  Do not use floor polish or wax that makes floors slippery. If you must use wax, use non-skid floor wax.  Do not have throw rugs and other things on the floor that can make you trip. What can I do with my stairs?  Do not leave any items on the stairs.  Make sure that there are  handrails on both sides of the stairs and use them. Fix handrails that are broken or loose. Make sure that handrails are as long as the stairways.  Check any carpeting to make sure that it is firmly attached to the stairs. Fix any carpet that is loose or worn.  Avoid having throw rugs at the top or bottom of the stairs. If you do have throw rugs, attach them to the floor with carpet tape.  Make sure that you have a light switch at the top of the stairs and the bottom of the stairs. If you do not have them, ask someone to add them for you. What else can I do to help prevent falls?  Wear shoes that:  Do not have high heels.  Have rubber bottoms.  Are comfortable and fit you well.  Are closed at the toe. Do not wear sandals.  If you use a stepladder:  Make sure that it is fully opened. Do not climb a closed stepladder.  Make sure that both sides of the stepladder are locked into place.  Ask someone to hold it for you,  if possible.  Clearly mark and make sure that you can see:  Any grab bars or handrails.  First and last steps.  Where the edge of each step is.  Use tools that help you move around (mobility aids) if they are needed. These include:  Canes.  Walkers.  Scooters.  Crutches.  Turn on the lights when you go into a dark area. Replace any light bulbs as soon as they burn out.  Set up your furniture so you have a clear path. Avoid moving your furniture around.  If any of your floors are uneven, fix them.  If there are any pets around you, be aware of where they are.  Review your medicines with your doctor. Some medicines can make you feel dizzy. This can increase your chance of falling. Ask your doctor what other things that you can do to help prevent falls. This information is not intended to replace advice given to you by your health care provider. Make sure you discuss any questions you have with your health care provider. Document Released: 03/17/2009 Document Revised: 10/27/2015 Document Reviewed: 06/25/2014 Elsevier Interactive Patient Education  2017 [employer].

## 2020-10-04 NOTE — Addendum Note (Signed)
Addended by: Lubertha Sayres on: 10/04/2020 05:05 PM   Modules accepted: Orders

## 2020-11-01 ENCOUNTER — Telehealth: Payer: Self-pay | Admitting: Pharmacist

## 2020-11-01 NOTE — Chronic Care Management (AMB) (Signed)
    Chronic Care Management Pharmacy Assistant   Name: Teresa Frey  MRN: [id number] DOB: 03-30-1930  Reason for Encounter: General Adherence Call       Recent office visits:  . 04.29.2022 Teresa Pigg, LPN Medicare Wellness Exam . 04.28.2022 Teresa Breeding, MD Cardiology follow-up for Atrial Fibrillation  Recent consult visits:  . 04.29.2022 Teresa Pigg, LPN Medicare Wellness Exam . 04.28.2022 Teresa Breeding, MD Cardiology follow-up for [name] visits:  None in previous 6 months  Medications: Outpatient Encounter Medications as of 11/01/2020  Medication Sig  . acetaminophen (TYLENOL) 325 MG tablet Take 1-2 tablets (325-650 mg total) by mouth every 6 (six) hours as needed for mild pain (pain score 1-3 or temp > 100.5).  Marland Kitchen albuterol (VENTOLIN HFA) 108 (90 Base) MCG/ACT inhaler Inhale 2 puffs into the lungs every 6 (six) hours as needed for wheezing or shortness of breath.  . anastrozole (ARIMIDEX) 1 MG tablet TAKE 1 TABLET BY MOUTH DAILY  . Ascorbic Acid (VITAMIN C) 1000 MG tablet Take 1,000 mg by mouth daily.  Marland Kitchen BIOTIN 5000 PO Take 1 tablet by mouth daily.  . Calcium Carb-Cholecalciferol (CALCIUM 1000 + D PO) Take 1 tablet by mouth daily.  . calcium-vitamin D (OSCAL WITH D) 500-200 MG-UNIT tablet Take 1 tablet by mouth 3 (three) times daily. (Patient taking differently: Take 1 tablet by mouth daily.)  . docusate sodium (COLACE) 100 MG capsule Take 1 capsule (100 mg total) by mouth 2 (two) times daily. (Patient not taking: Reported on 09/30/2020)  . hydrochlorothiazide (MICROZIDE) 12.5 MG capsule TAKE 1 CAPSULE (12.5 MG TOTAL) BY MOUTH DAILY.  . methylPREDNISolone (MEDROL DOSEPAK) 4 MG TBPK tablet Use as directed (Patient not taking: Reported on 09/30/2020)  . metoprolol tartrate (LOPRESSOR) 25 MG tablet TAKE 1 TABLET BY MOUTH DAILY.  . Multiple Vitamins-Minerals (CENTRUM SILVER ADULT 50+) TABS Take 1 tablet by mouth daily.  Marland Kitchen omega-3 acid ethyl esters (LOVAZA)  1 g capsule Take 1 g by mouth daily.   . polyethylene glycol (MIRALAX / GLYCOLAX) packet Take 17 g by mouth daily as needed for mild constipation. (Patient not taking: Reported on 09/30/2020)  . sertraline (ZOLOFT) 50 MG tablet TAKE 1 TABLET BY MOUTH DAILY.  . vitamin B-12 (CYANOCOBALAMIN) 1000 MCG tablet Take 1,000 mcg by mouth daily. (Patient not taking: Reported on 09/30/2020)  . warfarin (COUMADIN) 5 MG tablet TAKE UP TO 2 TABLETS DAILY OR AS DIRECTED.  Marland Kitchen zinc sulfate 220 (50 Zn) MG capsule Take 1 capsule (220 mg total) by mouth daily. (Patient not taking: Reported on 09/30/2020)   No facility-administered encounter medications on file as of 11/01/2020.   I spoke with the patient about medication adherence. She states that she has been doing well. She states that she retired in April. She continues to walk daily. She does not have any side effects from her current medication. There have been no recent changes to her medications. She is taking her medication as prescribed. There have been no emergency department or urgent care visits since her last PCP or CPP visit. There are no current issues with her pharmacy. She scheduled for her next CCM appointment in September.   Star Rating Drugs: None  Teresa Frey, [place] Pharmacist Assistant [phone]

## 2020-11-11 ENCOUNTER — Other Ambulatory Visit: Payer: Self-pay

## 2020-11-11 ENCOUNTER — Ambulatory Visit (INDEPENDENT_AMBULATORY_CARE_PROVIDER_SITE_OTHER): Payer: Medicare Other

## 2020-11-11 DIAGNOSIS — I4891 Unspecified atrial fibrillation: Secondary | ICD-10-CM | POA: Diagnosis not present

## 2020-11-11 DIAGNOSIS — Z7901 Long term (current) use of anticoagulants: Secondary | ICD-10-CM

## 2020-11-11 LAB — POCT INR: INR: 2.9 (ref 2.0–3.0)

## 2020-11-11 NOTE — Patient Instructions (Signed)
Continue with 2 tablets daily Repeat INR in 2 weeks (post antibiotic)

## 2020-11-18 ENCOUNTER — Other Ambulatory Visit: Payer: Self-pay

## 2020-11-18 ENCOUNTER — Ambulatory Visit (INDEPENDENT_AMBULATORY_CARE_PROVIDER_SITE_OTHER): Payer: Medicare Other

## 2020-11-18 DIAGNOSIS — Z7901 Long term (current) use of anticoagulants: Secondary | ICD-10-CM

## 2020-11-18 DIAGNOSIS — I4891 Unspecified atrial fibrillation: Secondary | ICD-10-CM | POA: Diagnosis not present

## 2020-11-18 LAB — POCT INR: INR: 3.3 — AB (ref 2.0–3.0)

## 2020-11-18 NOTE — Patient Instructions (Signed)
Hold tomorrow only and then  Continue with 2 tablets daily Repeat INR in 6 weeks

## 2020-11-24 ENCOUNTER — Telehealth: Payer: Self-pay | Admitting: Hematology

## 2020-11-24 NOTE — Telephone Encounter (Signed)
Rescheduled upcoming appointment due to provider's PAL. Patient is aware of changes. ?

## 2020-12-14 ENCOUNTER — Other Ambulatory Visit: Payer: Medicare Other

## 2020-12-14 ENCOUNTER — Ambulatory Visit: Payer: Medicare Other | Admitting: Hematology

## 2020-12-16 ENCOUNTER — Ambulatory Visit: Payer: Medicare Other | Admitting: Hematology

## 2020-12-16 ENCOUNTER — Other Ambulatory Visit: Payer: Medicare Other

## 2020-12-19 ENCOUNTER — Encounter: Payer: Self-pay | Admitting: Hematology

## 2020-12-19 ENCOUNTER — Other Ambulatory Visit: Payer: Self-pay

## 2020-12-19 ENCOUNTER — Inpatient Hospital Stay: Payer: Medicare Other | Attending: Hematology

## 2020-12-19 ENCOUNTER — Inpatient Hospital Stay: Payer: Medicare Other | Admitting: Hematology

## 2020-12-19 VITALS — BP 130/68 | HR 63 | Temp 98.6°F | Resp 19 | Ht 63.0 in | Wt 160.2 lb

## 2020-12-19 DIAGNOSIS — Z7901 Long term (current) use of anticoagulants: Secondary | ICD-10-CM | POA: Insufficient documentation

## 2020-12-19 DIAGNOSIS — Z170 Estrogen receptor positive status [ER+]: Secondary | ICD-10-CM

## 2020-12-19 DIAGNOSIS — Z79899 Other long term (current) drug therapy: Secondary | ICD-10-CM | POA: Insufficient documentation

## 2020-12-19 DIAGNOSIS — C50411 Malignant neoplasm of upper-outer quadrant of right female breast: Secondary | ICD-10-CM | POA: Insufficient documentation

## 2020-12-19 DIAGNOSIS — Z79811 Long term (current) use of aromatase inhibitors: Secondary | ICD-10-CM | POA: Insufficient documentation

## 2020-12-19 DIAGNOSIS — I4891 Unspecified atrial fibrillation: Secondary | ICD-10-CM | POA: Diagnosis not present

## 2020-12-19 DIAGNOSIS — M1990 Unspecified osteoarthritis, unspecified site: Secondary | ICD-10-CM | POA: Diagnosis not present

## 2020-12-19 DIAGNOSIS — M8580 Other specified disorders of bone density and structure, unspecified site: Secondary | ICD-10-CM | POA: Diagnosis not present

## 2020-12-19 LAB — CMP (CANCER CENTER ONLY)
ALT: 15 U/L (ref 0–44)
AST: 20 U/L (ref 15–41)
Albumin: 3.7 g/dL (ref 3.5–5.0)
Alkaline Phosphatase: 236 U/L — ABNORMAL HIGH (ref 38–126)
Anion gap: 9 (ref 5–15)
BUN: 17 mg/dL (ref 8–23)
CO2: 29 mmol/L (ref 22–32)
Calcium: 10 mg/dL (ref 8.9–10.3)
Chloride: 106 mmol/L (ref 98–111)
Creatinine: 0.77 mg/dL (ref 0.44–1.00)
GFR, Estimated: >60 mL/min (ref >60)
Glucose, Bld: 98 mg/dL (ref 70–99)
Potassium: 4.4 mmol/L (ref 3.5–5.1)
Sodium: 144 mmol/L (ref 135–145)
Total Bilirubin: 0.4 mg/dL (ref 0.3–1.2)
Total Protein: 7.2 g/dL (ref 6.5–8.1)

## 2020-12-19 LAB — CBC WITH DIFFERENTIAL (CANCER CENTER ONLY)
Abs Immature Granulocytes: 0.02 10*3/uL (ref 0.00–0.07)
Basophils Absolute: 0 10*3/uL (ref 0.0–0.1)
Basophils Relative: 0 %
Eosinophils Absolute: 0.1 10*3/uL (ref 0.0–0.5)
Eosinophils Relative: 1 %
HCT: 44 % (ref 36.0–46.0)
Hemoglobin: 14.3 g/dL (ref 12.0–15.0)
Immature Granulocytes: 0 %
Lymphocytes Relative: 26 %
Lymphs Abs: 2.5 10*3/uL (ref 0.7–4.0)
MCH: 30 pg (ref 26.0–34.0)
MCHC: 32.5 g/dL (ref 30.0–36.0)
MCV: 92.2 fL (ref 80.0–100.0)
Monocytes Absolute: 0.7 10*3/uL (ref 0.1–1.0)
Monocytes Relative: 7 %
Neutro Abs: 6.2 10*3/uL (ref 1.7–7.7)
Neutrophils Relative %: 66 %
Platelet Count: 205 10*3/uL (ref 150–400)
RBC: 4.77 MIL/uL (ref 3.87–5.11)
RDW: 13.3 % (ref 11.5–15.5)
WBC Count: 9.5 10*3/uL (ref 4.0–10.5)
nRBC: 0 % (ref 0.0–0.2)

## 2020-12-19 NOTE — Progress Notes (Signed)
[facility]   [phone] [phone]   Clinic Follow up Note   Patient Care Team: [name], [name], MD as PCP - General (Family Medicine) [name], MD as PCP - Cardiology (Cardiology) Fanny Skates, MD as Consulting Physician (General Surgery) Truitt Merle, MD as Consulting Physician (Hematology) Gery Pray, MD as Consulting Physician (Radiation Oncology) Delice Bison, Charlestine Massed, NP as Nurse Practitioner (Hematology and Oncology) Viona Gilmore, [facility] as Pharmacist (Pharmacist)  Date of Service:  12/19/2020  CHIEF COMPLAINT: f/u of right breast cancer  SUMMARY OF ONCOLOGIC HISTORY: Oncology History Overview Note  Breast cancer of upper-outer quadrant of right female breast [facility])   Staging form: Breast, AJCC 7th Edition   - Clinical stage from 01/18/2016: Stage IA (T1b, N0, M0) - Unsigned   - Pathologic stage from 02/08/2016: Stage IA (T1c, N0, cM0) - Signed by Truitt Merle, MD on 02/27/2016     Breast cancer of upper-outer quadrant of right female breast ([place])  01/05/2016 Mammogram   Diagnostic mammogram and ultrasound showed a 9 mm mass in the upper outer quadrant of right breast, axillary nodes were negative.    01/09/2016 Initial Diagnosis   Breast cancer of upper-outer quadrant of right female breast ([place])    01/09/2016 Initial Biopsy   R breast 9:00 position biopsy showed invasive ductal carcinoma, G1-2    01/09/2016 Receptors her2   ER 90%+, PR 40%+, HER2-, Ki67 5%    02/08/2016 Surgery   Right breast lumpectomy, no SLN biopsy      02/08/2016 Pathology Results   Right breast lumpectomy showed invasive and in situ ductal carcinoma, 1.1 cm, margins were negative. Grade 1, no lymphovascular invasion. Atypical lobular hyperplasia.    02/21/2016 Genetic Testing   Genetic testing did not reveal a known deleterious mutation.  Genetic testing did identify a variant of uncertain significance (VUS) called "c.1659G>A (p.Met553Ile)" in one copy  of the NBN gene.  Genes tested include: APC, ATM, AXIN2, BARD1, BMPR1A, BRCA1, BRCA2, BRIP1, CDH1, CDK4, CDKN2A, CHEK2, EPCAM, FANCC, MLH1, MSH2, MSH6, MUTYH, NBN, PALB2, PMS2, POLD1, POLE, PTEN, RAD51C, RAD51D, SCG5/GREM1, SMAD4, STK11, TP53, VHL, and XRCC2.   02/28/2016 -  Anti-estrogen oral therapy   Anastrozole 1 mg daily       CURRENT THERAPY:  anastrozole $RemoveBefo'1mg'OXRldhfEIXu$  daily started on 02/28/2016  INTERVAL HISTORY:  Teresa Frey is here for a follow up of breast cancer. She was last seen by me on 05/16/20. She presents to the clinic by herself. She came in a wheelchair. She has left leg pain, mainly in knee and left hip.  No any new pian or other complains. She lives in assisted living.   All other systems were reviewed with the patient and are negative.  MEDICAL HISTORY:  Past Medical History:  Diagnosis Date   ANXIETY 04/07/2007   Breast cancer of upper-outer quadrant of right female breast ([place]) 01/11/2016   COLONIC POLYPS, HX OF 04/02/2007   adenomatous   DIVERTICULITIS, HX OF 04/02/2007   Diverticulosis    DJD (degenerative joint disease)    Hemorrhoids    HYPERTENSION 04/02/2007   OSTEOARTHRITIS, SEVERE 04/07/2007   Paget's disease of bone    lumbar and sacrum   WEIGHT GAIN 11/13/2007    SURGICAL HISTORY: Past Surgical History:  Procedure Laterality Date   APPENDECTOMY     BREAST LUMPECTOMY WITH RADIOACTIVE SEED LOCALIZATION Right 02/08/2016   Procedure: RIGHT BREAST LUMPECTOMY WITH RADIOACTIVE SEED LOCALIZATION;  Surgeon: Fanny Skates, MD;  Location: [facility];  Service: General;  Laterality: Right;   ORIF FEMUR FRACTURE Left 04/21/2018   Procedure: OPEN REDUCTION INTERNAL FIXATION (ORIF) PERI PROSTHETIC FEMUR FRACTURE;  Surgeon: Leandrew Koyanagi, MD;  Location: [place];  Service: Orthopedics;  Laterality: Left;   TONSILLECTOMY     TOTAL HIP ARTHROPLASTY  1999, left hip    2006 right hip   TUBAL LIGATION      I have reviewed the social history and family  history with the patient and they are unchanged from previous note.  ALLERGIES:  is allergic to hydrocodone-acetaminophen and tramadol.  MEDICATIONS:  Current Outpatient Medications  Medication Sig Dispense Refill   acetaminophen (TYLENOL) 325 MG tablet Take 1-2 tablets (325-650 mg total) by mouth every 6 (six) hours as needed for mild pain (pain score 1-3 or temp > 100.5).     albuterol (VENTOLIN HFA) 108 (90 Base) MCG/ACT inhaler Inhale 2 puffs into the lungs every 6 (six) hours as needed for wheezing or shortness of breath. 18 g 2   anastrozole (ARIMIDEX) 1 MG tablet TAKE 1 TABLET BY MOUTH DAILY 90 tablet 3   Ascorbic Acid (VITAMIN C) 1000 MG tablet Take 1,000 mg by mouth daily.     BIOTIN 5000 PO Take 1 tablet by mouth daily.     Calcium Carb-Cholecalciferol (CALCIUM 1000 + D PO) Take 1 tablet by mouth daily.     calcium-vitamin D (OSCAL WITH D) 500-200 MG-UNIT tablet Take 1 tablet by mouth 3 (three) times daily. (Patient taking differently: Take 1 tablet by mouth daily.) 90 tablet 12   hydrochlorothiazide (MICROZIDE) 12.5 MG capsule TAKE 1 CAPSULE (12.5 MG TOTAL) BY MOUTH DAILY. 90 capsule 1   methylPREDNISolone (MEDROL DOSEPAK) 4 MG TBPK tablet Use as directed (Patient not taking: Reported on 09/30/2020) 21 tablet 0   metoprolol tartrate (LOPRESSOR) 25 MG tablet TAKE 1 TABLET BY MOUTH DAILY. 90 tablet 1   Multiple Vitamins-Minerals (CENTRUM SILVER ADULT 50+) TABS Take 1 tablet by mouth daily.     omega-3 acid ethyl esters (LOVAZA) 1 g capsule Take 1 g by mouth daily.      sertraline (ZOLOFT) 50 MG tablet TAKE 1 TABLET BY MOUTH DAILY. 90 tablet 1   vitamin B-12 (CYANOCOBALAMIN) 1000 MCG tablet Take 1,000 mcg by mouth daily. (Patient not taking: Reported on 09/30/2020)     warfarin (COUMADIN) 5 MG tablet TAKE UP TO 2 TABLETS DAILY OR AS DIRECTED. 180 tablet 1   zinc sulfate 220 (50 Zn) MG capsule Take 1 capsule (220 mg total) by mouth daily. (Patient not taking: Reported on 09/30/2020) 42  capsule 0   No current facility-administered medications for this visit.    PHYSICAL EXAMINATION: ECOG PERFORMANCE STATUS: 1 - Symptomatic but completely ambulatory  Vitals:   12/19/20 1253 12/19/20 1254  BP: 130/68   Pulse: 63   Resp: 19   Temp: 98.6 F (37 C)   SpO2: (!) 19% 94%   Filed Weights   12/19/20 1253  Weight: 160 lb 3.2 oz (72.7 kg)    GENERAL:alert, no distress and comfortable SKIN: skin color, texture, turgor are normal, no rashes or significant lesions EYES: normal, Conjunctiva are pink and non-injected, sclera clear  NECK: supple, thyroid normal size, non-tender, without nodularity LYMPH:  no palpable lymphadenopathy in the cervical, axillary  LUNGS: clear to auscultation and percussion with normal breathing effort HEART: regular rate & rhythm and no murmurs and no lower extremity edema ABDOMEN:abdomen soft, non-tender and normal bowel sounds Musculoskeletal:no cyanosis of  digits and no clubbing  NEURO: alert & oriented x 3 with fluent speech, no focal motor/sensory deficits BREAST:  No palpable mass, nodules or adenopathy bilaterally. Breast exam benign.   LABORATORY DATA:  I have reviewed the data as listed CBC Latest Ref Rng & Units 12/19/2020 05/16/2020 11/13/2019  WBC 4.0 - 10.5 K/uL 9.5 9.0 11.5(H)  Hemoglobin 12.0 - 15.0 g/dL 14.3 14.4 15.9(H)  Hematocrit 36.0 - 46.0 % 44.0 45.1 49.0(H)  Platelets 150 - 400 K/uL 205 187 216     CMP Latest Ref Rng & Units 12/19/2020 05/16/2020 11/13/2019  Glucose 70 - 99 mg/dL 98 85 97  BUN 8 - 23 mg/dL _0 Creatinine 0.44 - 1.00 mg/dL 0.77 0.91 0.82  Sodium 135 - 145 mmol/L 144 146(H) 145  Potassium 3.5 - 5.1 mmol/L 4.4 3.9 4.5  Chloride 98 - 111 mmol/L 106 107 105  CO2 22 - 32 mmol/L 29 33(H) 30  Calcium 8.9 - 10.3 mg/dL 10.0 10.4(H) 10.4(H)  Total Protein 6.5 - 8.1 g/dL 7.2 7.1 7.5  Total Bilirubin 0.3 - 1.2 mg/dL 0.4 0.5 0.4  Alkaline Phos 38 - 126 U/L 236(H) 211(H) 254(H)  AST 15 - 41 U/L _1 ALT 0 - 44 U/L _2 RADIOGRAPHIC STUDIES: I have personally reviewed the radiological images as listed and agreed with the findings in the report. No results found.   ASSESSMENT & PLAN:  Teresa Frey is a 87 y.o. female with   1. Breast cancer of upper-outer quadrant of right breast, invasive ductal carcinoma, pT1CN0M0, stage IA, ER+/PR+/HER2- -She was diagnosed in 01/2016. She is s/p right breast lumpectomy. Giving the low Ki-67 and early stage disease, I think this is likely a low risk cancer. I did not recommend adjuvant chemotherapy or Oncotype. -She started antiestrogen with Anastrozole in 02/2016. Tolerating well. Will continue for 5 years total. She will stop at the end of Sep 2022 -She is clinically doing well. Lab reviewed. Her physical exam and her 02/2020 mammogram were unremarkable. There is no clinical concern for recurrence. -Continue surveillance. Next mammogram in 02/2021 at [facility]. -f/u in April 2023 (she sees her PCP in Oct)    2. Genetic Testing was negative   3. HTN, AFib -She was on hydrochlorothiazide for hypertension before, which was held when she had a 2 fibrillation. She is on low-dose metoprolol. -Given her Afib episode on 06/28/17, she was placed on Lopressor 12.22m BID and Eliquis 5 mg BID   4. Arthralgia, Joint Stiffness -Chronic left hip pain. Has lower back pain. Follows with orthopedics. She ambulates with cane.   -Taking meloxicam. Stable on anastrozole.    5. Mild Osteopenia, 2019 left femur fracture, multiple fractures in 04/2019  -I previously spoke with the patient about the risk for decreased strength in bones with anastrozole -Had fall in 04/2018 that resulted in left femur fracture and a fall in 04/2019 with stage T12-L1 compression fracture. She has not had a fall in 2021. -Her 01/22/17 DEXA was normal. 02/23/20 DEXA showed mild osteopenia with lowest T-score -1.1 at left distal radius.  -She previously declined Zometa -Continue Vit D  daily. Given mild hypercalcemia, she can take calcium supplement every other day.     Plan  -Continue anastrozole until the end of Sep 2022 -[facility] and F/u in April 2023   No problem-specific Assessment & Plan notes found for this encounter.  Orders Placed This Encounter  Procedures   MM DIAG BREAST TOMO BILATERAL    Standing Status:   Future    Standing Expiration Date:   12/19/2021    Scheduling Instructions:     [facility]    Order Specific Question:   Reason for Exam (SYMPTOM  OR DIAGNOSIS REQUIRED)    Answer:   screening    Order Specific Question:   Preferred imaging location?    Answer:   External    All questions were answered. The patient knows to call the clinic with any problems, questions or concerns. No barriers to learning was detected. The total time spent in the appointment was 30 minutes.     Truitt Merle, MD 12/19/2020   I, Wilburn Mylar, am acting as scribe for Truitt Merle, MD.   I have reviewed the above documentation for accuracy and completeness, and I agree with the above.

## 2020-12-30 ENCOUNTER — Ambulatory Visit (INDEPENDENT_AMBULATORY_CARE_PROVIDER_SITE_OTHER): Payer: Medicare Other

## 2020-12-30 ENCOUNTER — Other Ambulatory Visit: Payer: Self-pay

## 2020-12-30 DIAGNOSIS — Z7901 Long term (current) use of anticoagulants: Secondary | ICD-10-CM

## 2020-12-30 DIAGNOSIS — I4891 Unspecified atrial fibrillation: Secondary | ICD-10-CM | POA: Diagnosis not present

## 2020-12-30 LAB — POCT INR: INR: 2.9 (ref 2.0–3.0)

## 2020-12-30 NOTE — Patient Instructions (Signed)
Continue with 2 tablets daily Repeat INR in 6 weeks

## 2021-01-10 ENCOUNTER — Other Ambulatory Visit: Payer: Self-pay | Admitting: Family Medicine

## 2021-02-03 ENCOUNTER — Other Ambulatory Visit: Payer: Self-pay | Admitting: Cardiology

## 2021-02-08 ENCOUNTER — Telehealth: Payer: Self-pay | Admitting: Pharmacist

## 2021-02-08 NOTE — Chronic Care Management (AMB) (Signed)
Chronic Care Management Pharmacy Assistant   Name: Teresa Frey  MRN: [id number] DOB: 1929/07/16   Reason for Encounter: Disease State/ Hypertension Assessment Call   Conditions to be addressed/monitored: HTN  Recent office visits:  None  Recent consult visits:  12-30-2020 Anti Coag Visit  - Patient presented for Anti Coag check reading was 2.9  12-19-2020 Teresa Merle, MD (Oncology) - Patient presented for Malignant neoplasm of upper-outer quadrant of right breast in female, estrogen receptor positive. Stopped COLACE 100 MG & MIRALAX / GLYCOLAX packet.  11-18-2020 Anti Coag Visit  - Patient presented for Anti Coag check reading was 3.3  11-11-2020 Anti Coag Visit  - Patient presented for Anti Coag check reading was 2.9  Hospital visits:  None in previous 6 months  Medications: Outpatient Encounter Medications as of 02/08/2021  Medication Sig   acetaminophen (TYLENOL) 325 MG tablet Take 1-2 tablets (325-650 mg total) by mouth every 6 (six) hours as needed for mild pain (pain score 1-3 or temp > 100.5).   albuterol (VENTOLIN HFA) 108 (90 Base) MCG/ACT inhaler Inhale 2 puffs into the lungs every 6 (six) hours as needed for wheezing or shortness of breath.   anastrozole (ARIMIDEX) 1 MG tablet TAKE 1 TABLET BY MOUTH DAILY   Ascorbic Acid (VITAMIN C) 1000 MG tablet Take 1,000 mg by mouth daily.   BIOTIN 5000 PO Take 1 tablet by mouth daily.   Calcium Carb-Cholecalciferol (CALCIUM 1000 + D PO) Take 1 tablet by mouth daily.   calcium-vitamin D (OSCAL WITH D) 500-200 MG-UNIT tablet Take 1 tablet by mouth 3 (three) times daily. (Patient taking differently: Take 1 tablet by mouth daily.)   hydrochlorothiazide (MICROZIDE) 12.5 MG capsule TAKE 1 CAPSULE (12.5 MG TOTAL) BY MOUTH DAILY.   methylPREDNISolone (MEDROL DOSEPAK) 4 MG TBPK tablet Use as directed (Patient not taking: Reported on 09/30/2020)   metoprolol tartrate (LOPRESSOR) 25 MG tablet TAKE 1 TABLET BY MOUTH DAILY.   Multiple  Vitamins-Minerals (CENTRUM SILVER ADULT 50+) TABS Take 1 tablet by mouth daily.   omega-3 acid ethyl esters (LOVAZA) 1 g capsule Take 1 g by mouth daily.    sertraline (ZOLOFT) 50 MG tablet TAKE 1 TABLET BY MOUTH DAILY.   vitamin B-12 (CYANOCOBALAMIN) 1000 MCG tablet Take 1,000 mcg by mouth daily. (Patient not taking: Reported on 09/30/2020)   warfarin (COUMADIN) 5 MG tablet TAKE UP TO 2 TABLETS DAILY OR AS DIRECTED.   zinc sulfate 220 (50 Zn) MG capsule Take 1 capsule (220 mg total) by mouth daily. (Patient not taking: Reported on 09/30/2020)   No facility-administered encounter medications on file as of 02/08/2021.  Reviewed chart prior to disease state call. Spoke with patient regarding BP  Recent Office Vitals: BP Readings from Last 3 Encounters:  12/19/20 130/68  09/30/20 122/68  09/29/20 125/71   Pulse Readings from Last 3 Encounters:  12/19/20 63  09/30/20 68  09/29/20 (!) 58    Wt Readings from Last 3 Encounters:  12/19/20 160 lb 3.2 oz (72.7 kg)  09/30/20 160 lb (72.6 kg)  09/29/20 163 lb 6.4 oz (74.1 kg)     Kidney Function Lab Results  Component Value Date/Time   CREATININE 0.77 12/19/2020 12:13 PM   CREATININE 0.91 05/16/2020 09:58 AM   CREATININE 0.7 04/19/2017 12:42 PM   CREATININE 0.7 12/21/2016 12:36 PM   GFR 80.43 03/06/2017 11:55 AM   GFRNONAA >60 12/19/2020 12:13 PM   GFRAA >60 11/13/2019 09:58 AM    BMP Latest Ref Rng &  Units 12/19/2020 05/16/2020 11/13/2019  Glucose 70 - 99 mg/dL 98 85 97  BUN 8 - 23 mg/dL '17 18 21  '$ Creatinine 0.44 - 1.00 mg/dL 0.77 0.91 0.82  Sodium 135 - 145 mmol/L 144 146(H) 145  Potassium 3.5 - 5.1 mmol/L 4.4 3.9 4.5  Chloride 98 - 111 mmol/L 106 107 105  CO2 22 - 32 mmol/L 29 33(H) 30  Calcium 8.9 - 10.3 mg/dL 10.0 10.4(H) 10.4(H)   Reviewed chart prior to disease state call. Spoke with patient regarding BP  Recent Office Vitals: BP Readings from Last 3 Encounters:  12/19/20 130/68  09/30/20 122/68  09/29/20 125/71   Pulse  Readings from Last 3 Encounters:  12/19/20 63  09/30/20 68  09/29/20 (!) 58    Wt Readings from Last 3 Encounters:  12/19/20 160 lb 3.2 oz (72.7 kg)  09/30/20 160 lb (72.6 kg)  09/29/20 163 lb 6.4 oz (74.1 kg)     Kidney Function Lab Results  Component Value Date/Time   CREATININE 0.77 12/19/2020 12:13 PM   CREATININE 0.91 05/16/2020 09:58 AM   CREATININE 0.7 04/19/2017 12:42 PM   CREATININE 0.7 12/21/2016 12:36 PM   GFR 80.43 03/06/2017 11:55 AM   GFRNONAA >60 12/19/2020 12:13 PM   GFRAA >60 11/13/2019 09:58 AM    BMP Latest Ref Rng & Units 12/19/2020 05/16/2020 11/13/2019  Glucose 70 - 99 mg/dL 98 85 97  BUN 8 - 23 mg/dL '17 18 21  '$ Creatinine 0.44 - 1.00 mg/dL 0.77 0.91 0.82  Sodium 135 - 145 mmol/L 144 146(H) 145  Potassium 3.5 - 5.1 mmol/L 4.4 3.9 4.5  Chloride 98 - 111 mmol/L 106 107 105  CO2 22 - 32 mmol/L 29 33(H) 30  Calcium 8.9 - 10.3 mg/dL 10.0 10.4(H) 10.4(H)    Current antihypertensive regimen:  Hydrochlorothiazide 12.5 mg 1 tablet daily Metoprolol 1 tablet 25 mg daily   Adherence Review: Is the patient currently on ACE/ARB medication? No Does the patient have >5 day gap between last estimated fill dates? Yes( call to pharmacy spoke to Teresa Frey who verified the below dates as accurate)  HYDROCHLOROTHIAZIDE 12.5 MG 12-13-2020 90   METOPROLOL TARTRATE 25 MG T 02-03-2021 90    Care Gaps: Covid Booster #3 Teresa Frey) - Overdue Zoster Vaccine - Overdue Flu Vaccine - Overdue CCM - Scheduled for 02-13-2021 AWV - Done 09-30-2020   Star Rating Drugs: None  Notes: Attempted to reach on 02-08-2021 left vm  [place] Pharmacist Assistant [phone]

## 2021-02-10 ENCOUNTER — Other Ambulatory Visit: Payer: Self-pay

## 2021-02-10 ENCOUNTER — Ambulatory Visit: Payer: Medicare Other | Admitting: Pharmacist Clinician (PhC)/ Clinical Pharmacy Specialist

## 2021-02-10 ENCOUNTER — Telehealth: Payer: Self-pay | Admitting: Pharmacist

## 2021-02-10 DIAGNOSIS — I4891 Unspecified atrial fibrillation: Secondary | ICD-10-CM

## 2021-02-10 DIAGNOSIS — Z7901 Long term (current) use of anticoagulants: Secondary | ICD-10-CM | POA: Diagnosis not present

## 2021-02-10 LAB — POCT INR: INR: 4.8 — AB (ref 2.0–3.0)

## 2021-02-10 NOTE — Progress Notes (Signed)
    Chronic Care Management Pharmacy Assistant   Name: Teresa Frey  MRN: [id number] DOB: 1930-01-16  02-10-2021 APPOINTMENT REMINDER   Called Teresa Coombs, No answer, left message of appointment on 02-13-2021 at 1 via telephone visit with Jeni Salles Pharm D. Notified to have all medications, supplements, blood pressure and/or blood sugar logs available during appointment and to return call if need to reschedule.   Medications: Outpatient Encounter Medications as of 02/10/2021  Medication Sig   acetaminophen (TYLENOL) 325 MG tablet Take 1-2 tablets (325-650 mg total) by mouth every 6 (six) hours as needed for mild pain (pain score 1-3 or temp > 100.5).   albuterol (VENTOLIN HFA) 108 (90 Base) MCG/ACT inhaler Inhale 2 puffs into the lungs every 6 (six) hours as needed for wheezing or shortness of breath.   anastrozole (ARIMIDEX) 1 MG tablet TAKE 1 TABLET BY MOUTH DAILY   Ascorbic Acid (VITAMIN C) 1000 MG tablet Take 1,000 mg by mouth daily.   BIOTIN 5000 PO Take 1 tablet by mouth daily.   Calcium Carb-Cholecalciferol (CALCIUM 1000 + D PO) Take 1 tablet by mouth daily.   calcium-vitamin D (OSCAL WITH D) 500-200 MG-UNIT tablet Take 1 tablet by mouth 3 (three) times daily. (Patient taking differently: Take 1 tablet by mouth daily.)   hydrochlorothiazide (MICROZIDE) 12.5 MG capsule TAKE 1 CAPSULE (12.5 MG TOTAL) BY MOUTH DAILY.   methylPREDNISolone (MEDROL DOSEPAK) 4 MG TBPK tablet Use as directed (Patient not taking: Reported on 09/30/2020)   metoprolol tartrate (LOPRESSOR) 25 MG tablet TAKE 1 TABLET BY MOUTH DAILY.   Multiple Vitamins-Minerals (CENTRUM SILVER ADULT 50+) TABS Take 1 tablet by mouth daily.   omega-3 acid ethyl esters (LOVAZA) 1 g capsule Take 1 g by mouth daily.    sertraline (ZOLOFT) 50 MG tablet TAKE 1 TABLET BY MOUTH DAILY.   vitamin B-12 (CYANOCOBALAMIN) 1000 MCG tablet Take 1,000 mcg by mouth daily. (Patient not taking: Reported on 09/30/2020)   warfarin (COUMADIN) 5 MG  tablet TAKE UP TO 2 TABLETS DAILY OR AS DIRECTED.   zinc sulfate 220 (50 Zn) MG capsule Take 1 capsule (220 mg total) by mouth daily. (Patient not taking: Reported on 09/30/2020)   No facility-administered encounter medications on file as of 02/10/2021.     Care Gaps: Covid Booster #3 [name]) - Overdue Zoster Vaccine - Overdue Flu Vaccine - Overdue CCM - Scheduled for 02-13-2021 AWV - Done 09-30-2020   Star Rating Drugs: None  Ned Clines [facility] Clinical Pharmacist Assistant [phone]

## 2021-02-13 ENCOUNTER — Ambulatory Visit (INDEPENDENT_AMBULATORY_CARE_PROVIDER_SITE_OTHER): Payer: Medicare Other | Admitting: Pharmacist

## 2021-02-13 DIAGNOSIS — I10 Essential (primary) hypertension: Secondary | ICD-10-CM

## 2021-02-13 DIAGNOSIS — I4891 Unspecified atrial fibrillation: Secondary | ICD-10-CM

## 2021-02-13 NOTE — Progress Notes (Signed)
Chronic Care Management Pharmacy Note  02/24/2021 Name:  NELMA PHAGAN MRN:  [id number] DOB:  Feb 18, 1930  Summary: BP at goal < 140/90 per home readings   Recommendations/Changes made from today's visit: -Recommended stopping fish oil given risk for bleeding with warfarin   Plan: Patient requested no follow up   Subjective: CAROLIE MCILRATH is an 87 y.o. year old female who is a primary patient of [name], Malka So, MD.  The CCM team was consulted for assistance with disease management and care coordination needs.    Engaged with patient by telephone for follow up visit in response to provider referral for pharmacy case management and/or care coordination services.   Consent to Services:  The patient was given information about Chronic Care Management services, agreed to services, and gave verbal consent prior to initiation of services.  Please see initial visit note for detailed documentation.   Patient Care Team: [name], [name], MD as PCP - General (Family Medicine) [name], MD as PCP - Cardiology (Cardiology) Fanny Skates, MD as Consulting Physician (General Surgery) Truitt Merle, MD as Consulting Physician (Hematology) Gery Pray, MD as Consulting Physician (Radiation Oncology) Delice Bison Charlestine Massed, NP as Nurse Practitioner (Hematology and Oncology) Viona Gilmore, [facility] as Pharmacist (Pharmacist)  Recent office visits: 09/30/20 [name], LPN: Patient presented for AWV.  03/22/20 Betty [name], MD: Patient presented for COPD follow up. Influenza vaccine administered.  Recent consult visits: 02/10/21 Frederik Schmidt, RN (cardiology): Patient presented for anti-coag visit. INR 4.8, goal 2-3. Held dose x 1. Decreased to 2 tablets (10 mg) daily except 1.5 tablets (7.5 mg) each Monday and Friday.  12-30-2020 Anti Coag Visit  - Patient presented for Anti Coag check reading was 2.9   12-19-2020 Truitt Merle, MD (Oncology) - Patient presented for Malignant neoplasm of  upper-outer quadrant of right breast in female, estrogen receptor positive. Stopped COLACE 100 MG & MIRALAX / GLYCOLAX packet.   11-18-2020 Anti Coag Visit  - Patient presented for Anti Coag check reading was 3.3   11-11-2020 Anti Coag Visit  - Patient presented for Anti Coag check reading was 2.9  09/29/20 [name], MD (cardiology): Patient presented for A fib follow up.   Hospital visits: None in previous 6 months  Objective:  Lab Results  Component Value Date   CREATININE 0.77 12/19/2020   BUN 17 12/19/2020   GFR 80.43 03/06/2017   GFRNONAA >60 12/19/2020   GFRAA >60 11/13/2019   NA 144 12/19/2020   K 4.4 12/19/2020   CALCIUM 10.0 12/19/2020   CO2 29 12/19/2020    Lab Results  Component Value Date/Time   GFR 80.43 03/06/2017 11:55 AM   GFR 79.45 10/06/2015 08:46 AM    Last diabetic Eye exam: No results found for: HMDIABEYEEXA  Last diabetic Foot exam: No results found for: HMDIABFOOTEX   Lab Results  Component Value Date   CHOL 200 10/06/2015   HDL 53.20 10/06/2015   LDLCALC 121 (H) 10/06/2015   LDLDIRECT 126.1 06/12/2013   TRIG 133.0 10/06/2015   CHOLHDL 4 10/06/2015    Hepatic Function Latest Ref Rng & Units 12/19/2020 05/16/2020 11/13/2019  Total Protein 6.5 - 8.1 g/dL 7.2 7.1 7.5  Albumin 3.5 - 5.0 g/dL 3.7 3.8 4.0  AST 15 - 41 U/L 20 17 23   ALT 0 - 44 U/L 15 13 15   Alk Phosphatase 38 - 126 U/L 236(H) 211(H) 254(H)  Total Bilirubin 0.3 - 1.2 mg/dL 0.4 0.5 0.4  Bilirubin, Direct 0.1 -  0.5 mg/dL - - -    Lab Results  Component Value Date/Time   TSH 1.66 03/06/2017 11:55 AM   TSH 1.54 10/06/2015 08:46 AM    CBC Latest Ref Rng & Units 12/19/2020 05/16/2020 11/13/2019  WBC 4.0 - 10.5 K/uL 9.5 9.0 11.5(H)  Hemoglobin 12.0 - 15.0 g/dL 14.3 14.4 15.9(H)  Hematocrit 36.0 - 46.0 % 44.0 45.1 49.0(H)  Platelets 150 - 400 K/uL 205 187 216    Lab Results  Component Value Date/Time   VD25OH 45.97 05/16/2020 09:58 AM    Clinical ASCVD: No  The ASCVD  Risk score (Arnett DK, et al., 2019) failed to calculate for the following reasons:   The 2019 ASCVD risk score is only valid for ages 28 to 90    Depression screen PHQ 2/9 09/30/2020 09/30/2020 09/21/2019  Decreased Interest 0 0 0  Down, Depressed, Hopeless 0 0 0  PHQ - 2 Score 0 0 0  Some recent data might be hidden     CHA2DS2/VAS Stroke Risk Points  Current as of 4 days ago     4 >= 2 Points: High Risk  1 - 1.99 Points: Medium Risk  0 Points: Low Risk    Last Change: N/A      Details    This score determines the patient's risk of having a stroke if the  patient has atrial fibrillation.       Points Metrics  0 Has Congestive Heart Failure:  No    Current as of 4 days ago  0 Has Vascular Disease:  No    Current as of 4 days ago  1 Has Hypertension:  Yes    Current as of 4 days ago  2 Age:  65    Current as of 4 days ago  0 Has Diabetes:  No    Current as of 4 days ago  0 Had Stroke:  No  Had TIA:  No  Had Thromboembolism:  No    Current as of 4 days ago  1 Female:  Yes    Current as of 4 days ago        Social History   Tobacco Use  Smoking Status Former   Packs/day: 1.00   Years: 27.00   Pack years: 27.00   Types: Cigarettes   Quit date: 06/04/1978   Years since quitting: 42.7  Smokeless Tobacco Never   BP Readings from Last 3 Encounters:  12/19/20 130/68  09/30/20 122/68  09/29/20 125/71   Pulse Readings from Last 3 Encounters:  12/19/20 63  09/30/20 68  09/29/20 (!) 58   Wt Readings from Last 3 Encounters:  12/19/20 160 lb 3.2 oz (72.7 kg)  09/30/20 160 lb (72.6 kg)  09/29/20 163 lb 6.4 oz (74.1 kg)    Assessment/Interventions: Review of patient past medical history, allergies, medications, health status, including review of consultants reports, laboratory and other test data, was performed as part of comprehensive evaluation and provision of chronic care management services.   SDOH:  (Social Determinants of Health) assessments and interventions  performed: No   CCM Care Plan  Allergies  Allergen Reactions   Hydrocodone-Acetaminophen    Tramadol     Loss of control    Medications Reviewed Today     Reviewed by Truitt Merle, MD (Physician) on 12/19/20 at 1338  Med List Status: <None>   Medication Order Taking? Sig Documenting Provider Last Dose Status Informant  acetaminophen (TYLENOL) 325 MG tablet [id number] No Take 1-2 tablets (  325-650 mg total) by mouth every 6 (six) hours as needed for mild pain (pain score 1-3 or temp > 100.5). Hosie Poisson, MD Taking Active   albuterol (VENTOLIN HFA) 108 (90 Base) MCG/ACT inhaler [id number] No Inhale 2 puffs into the lungs every 6 (six) hours as needed for wheezing or shortness of breath. [name], [name], MD Taking Active   anastrozole (ARIMIDEX) 1 MG tablet [id number] No TAKE 1 TABLET BY MOUTH DAILY Truitt Merle, MD Taking Active   Ascorbic Acid (VITAMIN C) 1000 MG tablet [id number] No Take 1,000 mg by mouth daily. [provider] Taking Active Self  BIOTIN 5000 PO [id number] No Take 1 tablet by mouth daily. [provider] Taking Active Self  Calcium Carb-Cholecalciferol (CALCIUM 1000 + D PO) [id number] No Take 1 tablet by mouth daily. [provider] Taking Active Self  calcium-vitamin D (OSCAL WITH D) 500-200 MG-UNIT tablet [id number] No Take 1 tablet by mouth 3 (three) times daily.  Patient taking differently: Take 1 tablet by mouth daily.   Leandrew Koyanagi, MD Taking Active   hydrochlorothiazide (MICROZIDE) 12.5 MG capsule [id number] No TAKE 1 CAPSULE (12.5 MG TOTAL) BY MOUTH DAILY. [name], [name], MD Taking Active   methylPREDNISolone (MEDROL DOSEPAK) 4 MG TBPK tablet [id number] No Use as directed  Patient not taking: Reported on 09/30/2020   Leandrew Koyanagi, MD Not Taking Active   metoprolol tartrate (LOPRESSOR) 25 MG tablet [id number] No TAKE 1 TABLET BY MOUTH DAILY. [name], MD Taking Active   Multiple Vitamins-Minerals (CENTRUM SILVER ADULT 50+) TABS [id number] No  Take 1 tablet by mouth daily. [provider] Taking Active Self  omega-3 acid ethyl esters (LOVAZA) 1 g capsule [id number] No Take 1 g by mouth daily.  [provider] Taking Active Self  sertraline (ZOLOFT) 50 MG tablet [id number] No TAKE 1 TABLET BY MOUTH DAILY. [name], [name], MD Taking Active   vitamin B-12 (CYANOCOBALAMIN) 1000 MCG tablet [id number] No Take 1,000 mcg by mouth daily.  Patient not taking: Reported on 09/30/2020   [provider] Not Taking Active Self  warfarin (COUMADIN) 5 MG tablet [id number] No TAKE UP TO 2 TABLETS DAILY OR AS DIRECTED. [name], MD Taking Active   zinc sulfate 220 (50 Zn) MG capsule [id number] No Take 1 capsule (220 mg total) by mouth daily.  Patient not taking: Reported on 09/30/2020   Leandrew Koyanagi, MD Not Taking Active             Patient Active Problem List   Diagnosis Date Noted   Atrial fibrillation with RVR ([place]) 09/28/2020   Multiple falls 11/13/2019   COPD (chronic obstructive pulmonary disease) ([place]) 09/21/2019   Educated about COVID-19 virus infection 09/09/2019   Spondylosis without myelopathy or radiculopathy, lumbar region 02/12/2019   Spondylolisthesis of lumbar region 02/12/2019   Pain of left hip joint 12/23/2018   Primary osteoarthritis of left knee 12/23/2018   Periprosthetic fracture around internal prosthetic left hip joint ([place]) 04/19/2018   Long term (current) use of anticoagulants [Z79.01] 08/14/2017   Genetic testing 04/01/2016   possible sleep apnea by history 02/12/2016   A-fib ([place]) 02/11/2016   Surgical wound infection 02/11/2016   Breast cancer of upper-outer quadrant of right female breast ([place]) 01/11/2016   Impaired glucose tolerance 07/28/2014   Anxiety disorder, unspecified 04/07/2007   Osteoarthritis 04/07/2007   Essential hypertension 04/02/2007   History of colonic polyps 04/02/2007   DIVERTICULITIS, HX OF 04/02/2007    Immunization History  Administered  Date(s)  Administered   Fluad Quad(high Dose 65+) 03/22/2019, 03/22/2020   Influenza Split 04/05/2011, 05/07/2012   Influenza Whole 03/04/2006, 05/06/2007, 02/25/2008, 03/25/2010, 03/21/2013   Influenza, High Dose Seasonal PF 04/05/2015, 02/29/2016, 04/19/2017, 04/04/2018   Influenza,inj,Quad PF,6+ Mos 04/07/2014   Janssen (J&J) SARS-COV-2 Vaccination 08/18/2019   Moderna SARS-COV2 Booster Vaccination 04/30/2020   Pneumococcal Conjugate-13 06/12/2013   Pneumococcal Polysaccharide-23 03/04/2006   Tdap 01/24/2015   Tetanus 06/12/2013   Zoster Recombinat (Shingrix) 10/11/2020, 01/13/2021   Zoster, Live 09/25/2013   Patient is enjoying her retirement. She has been sleeping and reading a lot more.  Conditions to be addressed/monitored:  Hypertension, Atrial Fibrillation, COPD and frequent crying, breast cancer  Conditions addressed this visit: Hypertension, Atrial fibrillation  Care Plan : CCM Pharmacy Care Plan  Updates made by Viona Gilmore, [place] since 02/24/2021 12:00 AM     Problem: Problem: Hypertension, Atrial Fibrillation, COPD and frequent crying, breast cancer      Long-Range Goal: Patient-Specific Goal   Start Date: 08/15/2020  Expected End Date: 08/15/2021  Recent Progress: On track  Priority: High  Note:   Current Barriers:  Unable to independently monitor therapeutic efficacy  Pharmacist Clinical Goal(s):  Patient will achieve adherence to monitoring guidelines and medication adherence to achieve therapeutic efficacy through collaboration with PharmD and provider.   Interventions: 1:1 collaboration with [name], [name], MD regarding development and update of comprehensive plan of care as evidenced by provider attestation and co-signature Inter-disciplinary care team collaboration (see longitudinal plan of care) Comprehensive medication review performed; medication list updated in electronic medical record  Hypertension (BP goal <140/90) -Controlled -Current  treatment: Hydrochlorothiazide 12.5 mg 1 tablet daily Metoprolol tartrate 1 tablet 25 mg daily -Medications previously tried: none  -Current home readings: 132/63 HR 54; 128-135 (checks 2-3 times a week) -Current dietary habits: eats out or eats frozen meals -Current exercise habits: limited exercise as she ambulates with a cane ; doing leg exercises from when she broke her femur (every day) -Denies hypotensive/hypertensive symptoms -Educated on Exercise goal of 150 minutes per week; Importance of home blood pressure monitoring; Proper BP monitoring technique; -Counseled to monitor BP at home weekly, document, and provide log at future appointments -Recommended to continue current medication  Atrial Fibrillation (Goal: prevent stroke and major bleeding) -Controlled -CHADSVASC: 4 -Current treatment: Rate control: Metoprolol tartrate 25 mg daily Anticoagulation: Warfarin 10 daily and 7.5 mg on Mon and Fri -Medications previously tried: none -Home BP and HR readings: 132/63 HR 54; 128-135 (checks 2-3 times per week) -Counseled on bleeding risk associated with warfarin and importance of self-monitoring for signs/symptoms of bleeding; -Recommended to continue current medication Counseled on bleeding risk with fish oil.  COPD (Goal: control symptoms and prevent exacerbations) -Controlled -Current treatment  Albuterol 2 puffs every 6 hours as needed for shortness of breath -Medications previously tried: nebulizer  -Current COPD Classification:  A (low sx, <2 exacerbations/yr) -MMRC/CAT score: n/a -Pulmonary function testing: n/a -Exacerbations requiring treatment in last 6 months: none -Patient denies consistent use of maintenance inhaler -Frequency of rescue inhaler use: depends on exertion; might not use it for a week or two at a time -Counseled on Proper inhaler technique; When to use rescue inhaler -Recommended to continue current medication   Frequency crying (Goal: minimize  symptoms) -Controlled -Current treatment: Sertraline 50 mg daily -Medications previously tried/failed: none -PHQ9: 0 -Educated on Benefits of medication for symptom control -Recommended to continue current medication  Breast cancer (Goal: remission) -Controlled -Current treatment  Anastrazole  1 mg 1 tablet daily -Medications previously tried: none  -Recommended to continue current medication  Osteopenia (Goal prevent fractures) -Controlled -Last DEXA Scan: n/a   T-Score femoral neck: n/a  T-Score total hip: -1.1  T-Score lumbar spine: n/a  T-Score forearm radius: -1.1  10-year probability of major osteoporotic fracture: n/a  10-year probability of hip fracture: n/a -Patient is not a candidate for pharmacologic treatment -Current treatment  Calcium 1000 mg & D3 1000 units once daily -Medications previously tried: none  -Recommend [phone] units of vitamin D daily. Recommend 1200 mg of calcium daily from dietary and supplemental sources. Recommend weight-bearing and muscle strengthening exercises for building and maintaining bone density. -Recommended to continue current medication   Health Maintenance -Vaccine gaps: COVID booster, influenza -Current therapy:  Tylenol 325 mg as needed (typically once every AM) Vitamin C 2000 mg daily Biotin 5000 daily Centrum silver daily Vitamin B-12 1000 mcg daily Fish oil 1200 mg daily -Educated on Cost vs benefit of each product must be carefully weighed by individual consumer -Patient is satisfied with current therapy and denies issues -Recommended to continue current medication  Patient Goals/Self-Care Activities Patient will:  - take medications as prescribed check blood pressure weekly, document, and provide at future appointments target a minimum of 150 minutes of moderate intensity exercise weekly  Follow Up Plan: The patient has been provided with contact information for the care management team and has been advised to  call with any health related questions or concerns.          Medication Assistance: None required.  Patient affirms current coverage meets needs.  Compliance/Adherence/Medication fill history: Care Gaps: COVID booster, shingrix second dose, Prevnar   Star-Rating Drugs: None  Patient's preferred pharmacy is:  [facility] [address] [place] [address] Phone: [phone] Fax: [phone]  [facility] [phone] - [address], [address] [place] [address] Phone: [phone] Fax: [phone]  Uses pill box? No - uses her shelf and takes all medicines at once Pt endorses 100% compliance  We discussed: Current pharmacy is preferred with insurance plan and patient is satisfied with pharmacy services Patient decided to: Continue current medication management strategy  Care Plan and Follow Up Patient Decision:  Patient requests no follow-up at this time.  Plan: The patient has been provided with contact information for the care management team and has been advised to call with any health related questions or concerns.   Jeni Salles, PharmD [facility] Clinical Pharmacist [place] [facility] [place]

## 2021-02-23 ENCOUNTER — Other Ambulatory Visit: Payer: Self-pay | Admitting: Cardiology

## 2021-02-23 NOTE — Telephone Encounter (Signed)
Prescription refill request received for warfarin Lov: 09/29/2020 Next INR check: 9/23 Warfarin tablet strength: 5mg 

## 2021-02-24 ENCOUNTER — Ambulatory Visit (INDEPENDENT_AMBULATORY_CARE_PROVIDER_SITE_OTHER): Payer: Medicare Other

## 2021-02-24 ENCOUNTER — Other Ambulatory Visit: Payer: Self-pay

## 2021-02-24 DIAGNOSIS — Z7901 Long term (current) use of anticoagulants: Secondary | ICD-10-CM

## 2021-02-24 DIAGNOSIS — I4891 Unspecified atrial fibrillation: Secondary | ICD-10-CM | POA: Diagnosis not present

## 2021-02-24 LAB — POCT INR: INR: 3.2 — AB (ref 2.0–3.0)

## 2021-02-24 NOTE — Patient Instructions (Signed)
Take only 1 tablet on Saturday and then continue 2 tablets daily except 1.5 tablets each Monday and Friday.  Repeat INR in 3 weeks

## 2021-03-03 DIAGNOSIS — I10 Essential (primary) hypertension: Secondary | ICD-10-CM

## 2021-03-03 DIAGNOSIS — I4891 Unspecified atrial fibrillation: Secondary | ICD-10-CM

## 2021-03-13 ENCOUNTER — Other Ambulatory Visit: Payer: Self-pay | Admitting: Family Medicine

## 2021-03-15 DIAGNOSIS — L821 Other seborrheic keratosis: Secondary | ICD-10-CM | POA: Diagnosis not present

## 2021-03-15 DIAGNOSIS — L309 Dermatitis, unspecified: Secondary | ICD-10-CM | POA: Diagnosis not present

## 2021-03-16 ENCOUNTER — Ambulatory Visit (INDEPENDENT_AMBULATORY_CARE_PROVIDER_SITE_OTHER): Payer: Medicare Other

## 2021-03-16 ENCOUNTER — Other Ambulatory Visit: Payer: Self-pay

## 2021-03-16 DIAGNOSIS — I4891 Unspecified atrial fibrillation: Secondary | ICD-10-CM

## 2021-03-16 DIAGNOSIS — Z7901 Long term (current) use of anticoagulants: Secondary | ICD-10-CM | POA: Diagnosis not present

## 2021-03-16 LAB — POCT INR: INR: 3.2 — AB (ref 2.0–3.0)

## 2021-03-16 NOTE — Patient Instructions (Signed)
Decrease to 2 tablets daily except 1.5 tablets each Monday, Wednesday and Friday.  Repeat INR in 3 weeks

## 2021-03-20 DIAGNOSIS — Z853 Personal history of malignant neoplasm of breast: Secondary | ICD-10-CM | POA: Diagnosis not present

## 2021-03-20 LAB — HM MAMMOGRAPHY

## 2021-03-21 ENCOUNTER — Encounter: Payer: Self-pay | Admitting: Family Medicine

## 2021-04-06 ENCOUNTER — Ambulatory Visit: Payer: Medicare Other

## 2021-04-06 ENCOUNTER — Other Ambulatory Visit: Payer: Self-pay | Admitting: Family Medicine

## 2021-04-06 ENCOUNTER — Other Ambulatory Visit: Payer: Self-pay

## 2021-04-06 DIAGNOSIS — Z7901 Long term (current) use of anticoagulants: Secondary | ICD-10-CM | POA: Diagnosis not present

## 2021-04-06 DIAGNOSIS — I4891 Unspecified atrial fibrillation: Secondary | ICD-10-CM

## 2021-04-06 LAB — POCT INR: INR: 2.9 (ref 2.0–3.0)

## 2021-04-06 NOTE — Patient Instructions (Signed)
Continue 2 tablets daily except 1.5 tablets each Monday, Wednesday and Friday.  Repeat INR in 6 weeks

## 2021-05-08 ENCOUNTER — Other Ambulatory Visit: Payer: Self-pay | Admitting: Family Medicine

## 2021-05-10 ENCOUNTER — Encounter: Payer: Self-pay | Admitting: Family Medicine

## 2021-05-10 ENCOUNTER — Ambulatory Visit (INDEPENDENT_AMBULATORY_CARE_PROVIDER_SITE_OTHER): Payer: Medicare Other | Admitting: Family Medicine

## 2021-05-10 VITALS — BP 130/82 | HR 63 | Temp 98.4°F | Resp 16 | Ht 63.0 in | Wt 162.8 lb

## 2021-05-10 DIAGNOSIS — I10 Essential (primary) hypertension: Secondary | ICD-10-CM | POA: Diagnosis not present

## 2021-05-10 DIAGNOSIS — F419 Anxiety disorder, unspecified: Secondary | ICD-10-CM

## 2021-05-10 DIAGNOSIS — J449 Chronic obstructive pulmonary disease, unspecified: Secondary | ICD-10-CM

## 2021-05-10 MED ORDER — ALBUTEROL SULFATE HFA 108 (90 BASE) MCG/ACT IN AERS: 2.0000 | INHALATION_SPRAY | Freq: Four times a day (QID) | RESPIRATORY_TRACT | 4 refills | Status: DC | PRN

## 2021-05-10 MED ORDER — SERTRALINE HCL 50 MG PO TABS: 50.0000 mg | ORAL_TABLET | Freq: Every day | ORAL | 3 refills | Status: DC

## 2021-05-10 NOTE — Assessment & Plan Note (Signed)
Problem is well controlled with current management. We discussed some side effects, she is not interested in weaning medication off. She will continue sertraline 50 mg daily. Continue annual follow ups.

## 2021-05-10 NOTE — Assessment & Plan Note (Signed)
Problem is stable. Continue albuterol inhaler 2 puffs every 4-6 hours as needed for wheezing or dyspnea. Continue following annually, before if problem gets worse.

## 2021-05-10 NOTE — Progress Notes (Signed)
HPI: Teresa Frey is a 87 y.o. female with history of generalized OA, hypertension, COPD, right breast cancer on anastrozole, and atrial fibrillation on chronic anticoagulation here today for medication follow up.  She was last seen on 03/22/20. Anxiety on Zoloft (sertraline) 50 mg daily. She has taking medication for many years, still feels like it is helping. She has tolerating well. She usually gets 8 hours of sleep.  Depression screen [facility] 2/9 05/10/2021 09/30/2020 09/30/2020 09/21/2019 02/24/2018  Decreased Interest 0 0 0 0 0  Down, Depressed, Hopeless 0 0 0 0 0  PHQ - 2 Score 0 0 0 0 0  Altered sleeping 0 - - - -  Tired, decreased energy 0 - - - -  Change in appetite 0 - - - -  Feeling bad or failure about yourself  0 - - - -  Trouble concentrating 0 - - - -  Moving slowly or fidgety/restless 0 - - - -  Suicidal thoughts 0 - - - -  PHQ-9 Score 0 - - - -  Some recent data might be hidden   Hypertension: Currently she is on metoprolol titrate 25 mg twice daily and HCTZ 12.5 mg daily. Negative for unusual/frequent headache, visual changes, CP, focal neurologic deficit, or edema. Atrial fibrillation on Coumadin. She follows regularly with cardiologist and Coumadin clinic.  Lab Results  Component Value Date   CREATININE 0.77 12/19/2020   BUN 17 12/19/2020   NA 144 12/19/2020   K 4.4 12/19/2020   CL 106 12/19/2020   CO2 29 12/19/2020   COPD: Currently she is on albuterol inhaler 2 puff every 6 hours as needed. She does not use inhaler frequently. Occasional wheezing, no cough or dyspnea. Former smoker, quit in 1980.  Review of Systems  Constitutional:  Negative for chills and fever.  HENT:  Negative for mouth sores and sore throat.   Gastrointestinal:  Negative for abdominal pain, nausea and vomiting.  Genitourinary:  Negative for decreased urine volume, dysuria and hematuria.  Musculoskeletal:  Positive for arthralgias and gait problem.  Neurological:  Negative for  syncope and facial asymmetry.  Rest of ROS, see pertinent positives sand negatives in HPI  Current Outpatient Medications on File Prior to Visit  Medication Sig Dispense Refill   acetaminophen (TYLENOL) 325 MG tablet Take 1-2 tablets (325-650 mg total) by mouth every 6 (six) hours as needed for mild pain (pain score 1-3 or temp > 100.5).     anastrozole (ARIMIDEX) 1 MG tablet TAKE 1 TABLET BY MOUTH DAILY 90 tablet 3   Ascorbic Acid (VITAMIN C) 1000 MG tablet Take 1,000 mg by mouth daily.     BIOTIN 5000 PO Take 1 tablet by mouth daily.     Calcium Carb-Cholecalciferol (CALCIUM 1000 + D PO) Take 1 tablet by mouth daily.     calcium-vitamin D (OSCAL WITH D) 500-200 MG-UNIT tablet Take 1 tablet by mouth 3 (three) times daily. (Patient taking differently: Take 1 tablet by mouth daily.) 90 tablet 12   hydrochlorothiazide (MICROZIDE) 12.5 MG capsule TAKE 1 CAPSULE BY MOUTH DAILY. 90 capsule 1   methylPREDNISolone (MEDROL DOSEPAK) 4 MG TBPK tablet Use as directed 21 tablet 0   metoprolol tartrate (LOPRESSOR) 25 MG tablet TAKE 1 TABLET BY MOUTH DAILY. 90 tablet 4   Multiple Vitamins-Minerals (CENTRUM SILVER ADULT 50+) TABS Take 1 tablet by mouth daily.     omega-3 acid ethyl esters (LOVAZA) 1 g capsule Take 1 g by mouth daily.  vitamin B-12 (CYANOCOBALAMIN) 1000 MCG tablet Take 1,000 mcg by mouth daily.     warfarin (COUMADIN) 5 MG tablet Take 1 to 2 tablets by mouth daily as directed by the coumadin clinic 180 tablet 0   zinc sulfate 220 (50 Zn) MG capsule Take 1 capsule (220 mg total) by mouth daily. 42 capsule 0   No current facility-administered medications on file prior to visit.   Past Medical History:  Diagnosis Date   ANXIETY 04/07/2007   Breast cancer of upper-outer quadrant of right female breast ([place]) 01/11/2016   COLONIC POLYPS, HX OF 04/02/2007   adenomatous   DIVERTICULITIS, HX OF 04/02/2007   Diverticulosis    DJD (degenerative joint disease)    Hemorrhoids    HYPERTENSION  04/02/2007   OSTEOARTHRITIS, SEVERE 04/07/2007   Paget's disease of bone    lumbar and sacrum   WEIGHT GAIN 11/13/2007   Allergies  Allergen Reactions   Hydrocodone-Acetaminophen    Tramadol     Loss of control    Social History   Socioeconomic History   Marital status: Married    Spouse name: Not on file   Number of children: 2   Years of education: Not on file   Highest education level: Not on file  Occupational History   Not on file  Tobacco Use   Smoking status: Former    Packs/day: 1.00    Years: 27.00    Pack years: 27.00    Types: Cigarettes    Quit date: 06/04/1978    Years since quitting: 42.9   Smokeless tobacco: Never  Vaping Use   Vaping Use: Never used  Substance and Sexual Activity   Alcohol use: Yes    Alcohol/week: 7.0 standard drinks    Types: 7 Glasses of wine per week    Comment: 1-2 glasses wine after dinner, but not necessarily each day   Drug use: No   Sexual activity: Not on file  Other Topics Concern   Not on file  Social History Narrative   Not on file   Social Determinants of Health   Financial Resource Strain: Low Risk    Difficulty of Paying Living Expenses: Not hard at all  Food Insecurity: No Food Insecurity   Worried About Charity fundraiser in the Last Year: Never true   Arboriculturist in the Last Year: Never true  Transportation Needs: Not on file  Physical Activity: Unknown   Days of Exercise per Week: 5 days   Minutes of Exercise per Session: Not on file  Stress: No Stress Concern Present   Feeling of Stress : Not at all  Social Connections: Moderately Isolated   Frequency of Communication with Friends and Family: More than three times a week   Frequency of Social Gatherings with Friends and Family: More than three times a week   Attends Religious Services: 1 to 4 times per year   Active Member of [employer] or Organizations: No   Attends Archivist Meetings: Never   Marital Status: Widowed   Vitals:   05/10/21  1158  BP: 130/82  Pulse: 63  Resp: 16  Temp: 98.4 F (36.9 C)  SpO2: 98%   Body mass index is 28.84 kg/m. Physical Exam Vitals and nursing note reviewed.  Constitutional:      General: She is not in acute distress.    Appearance: She is well-developed.  HENT:     Head: Normocephalic and atraumatic.     Mouth/Throat:  Mouth: Mucous membranes are moist.  Eyes:     Conjunctiva/sclera: Conjunctivae normal.  Cardiovascular:     Rate and Rhythm: Normal rate and regular rhythm.     Heart sounds: No murmur heard.    Comments: DP pulses present. Pulmonary:     Effort: Pulmonary effort is normal. No respiratory distress.     Breath sounds: Normal breath sounds.  Abdominal:     Palpations: Abdomen is soft.     Tenderness: There is no abdominal tenderness.  Skin:    General: Skin is warm.     Findings: No erythema or rash.  Neurological:     General: No focal deficit present.     Mental Status: She is alert and oriented to person, place, and time.     Cranial Nerves: No cranial nerve deficit.     Comments: Antalgic gait,assisted with a cane.   Psychiatric:     Comments: Well groomed, good eye contact.   ASSESSMENT AND PLAN:  Ms. ATLAS KUC was seen today for 12 months follow-up.  No orders of the defined types were placed in this encounter.  Essential hypertension BP adequately controlled. Continue metoprolol tartrate 25 mg twice daily and HCTZ 12.5 mg daily. Continue low-salt diet. Following with cardiology regularly.  COPD (chronic obstructive pulmonary disease) (HCC) Problem is stable. Continue albuterol inhaler 2 puffs every 4-6 hours as needed for wheezing or dyspnea. Continue following annually, before if problem gets worse.  Anxiety disorder, unspecified Problem is well controlled with current management. We discussed some side effects, she is not interested in weaning medication off. She will continue sertraline 50 mg daily. Continue annual follow  ups.  Return in about 1 year (around 05/10/2022).   Jayvian Escoe G. [name], MD  [facility]. [place] office.

## 2021-05-10 NOTE — Patient Instructions (Signed)
A few things to remember from today's visit:   Essential hypertension  Anxiety disorder, unspecified type  Chronic obstructive pulmonary disease, unspecified COPD type ([place]), Chronic - Plan: albuterol (VENTOLIN HFA) 108 (90 Base) MCG/ACT inhaler  Chronic obstructive pulmonary disease, unspecified COPD type ([place]) - Plan: albuterol (VENTOLIN HFA) 108 (90 Base) MCG/ACT inhaler  If you need refills please call your pharmacy. Do not use My Chart to request refills or for acute issues that need immediate attention.   No changes today. Fall precautions. I will see you back in a year, before if needed.  Please be sure medication list is accurate. If a new problem present, please set up appointment sooner than planned today.

## 2021-05-10 NOTE — Assessment & Plan Note (Signed)
BP adequately controlled. Continue metoprolol tartrate 25 mg twice daily and HCTZ 12.5 mg daily. Continue low-salt diet. Following with cardiology regularly.

## 2021-05-18 ENCOUNTER — Ambulatory Visit: Payer: Medicare Other

## 2021-05-18 ENCOUNTER — Other Ambulatory Visit: Payer: Self-pay

## 2021-05-18 DIAGNOSIS — I4891 Unspecified atrial fibrillation: Secondary | ICD-10-CM | POA: Diagnosis not present

## 2021-05-18 DIAGNOSIS — Z7901 Long term (current) use of anticoagulants: Secondary | ICD-10-CM

## 2021-05-18 LAB — POCT INR: INR: 3.2 — AB (ref 2.0–3.0)

## 2021-05-18 NOTE — Patient Instructions (Signed)
Continue 2 tablets daily except 1.5 tablets each Monday, Wednesday and Friday.  Repeat INR in 4 weeks; Eat greens tonight

## 2021-05-23 DIAGNOSIS — D485 Neoplasm of uncertain behavior of skin: Secondary | ICD-10-CM | POA: Diagnosis not present

## 2021-05-23 DIAGNOSIS — C44622 Squamous cell carcinoma of skin of right upper limb, including shoulder: Secondary | ICD-10-CM | POA: Diagnosis not present

## 2021-05-23 DIAGNOSIS — D0461 Carcinoma in situ of skin of right upper limb, including shoulder: Secondary | ICD-10-CM | POA: Diagnosis not present

## 2021-06-01 ENCOUNTER — Other Ambulatory Visit: Payer: Self-pay | Admitting: Cardiology

## 2021-06-08 DIAGNOSIS — D0461 Carcinoma in situ of skin of right upper limb, including shoulder: Secondary | ICD-10-CM | POA: Diagnosis not present

## 2021-06-15 ENCOUNTER — Other Ambulatory Visit: Payer: Self-pay

## 2021-06-15 ENCOUNTER — Ambulatory Visit: Payer: Medicare Other

## 2021-06-15 DIAGNOSIS — Z7901 Long term (current) use of anticoagulants: Secondary | ICD-10-CM | POA: Diagnosis not present

## 2021-06-15 DIAGNOSIS — I4891 Unspecified atrial fibrillation: Secondary | ICD-10-CM

## 2021-06-15 LAB — POCT INR: INR: 3.7 — AB (ref 2.0–3.0)

## 2021-06-15 NOTE — Patient Instructions (Signed)
HOLD Friday and then Decrease to 1.5 tablets Daily, except 2 tablets  each Monday and Friday.  Repeat INR in 4 weeks;

## 2021-07-13 ENCOUNTER — Other Ambulatory Visit: Payer: Self-pay

## 2021-07-13 ENCOUNTER — Ambulatory Visit: Payer: Medicare Other

## 2021-07-13 DIAGNOSIS — Z7901 Long term (current) use of anticoagulants: Secondary | ICD-10-CM

## 2021-07-13 DIAGNOSIS — I4891 Unspecified atrial fibrillation: Secondary | ICD-10-CM

## 2021-07-13 LAB — POCT INR: INR: 2.8 (ref 2.0–3.0)

## 2021-07-13 NOTE — Patient Instructions (Signed)
Continue 1.5 tablets Daily, except 2 tablets  each Monday and Friday.  Repeat INR in 6 weeks;

## 2021-07-26 DIAGNOSIS — H02052 Trichiasis without entropian right lower eyelid: Secondary | ICD-10-CM | POA: Diagnosis not present

## 2021-07-26 DIAGNOSIS — H0100A Unspecified blepharitis right eye, upper and lower eyelids: Secondary | ICD-10-CM | POA: Diagnosis not present

## 2021-07-26 DIAGNOSIS — H02055 Trichiasis without entropian left lower eyelid: Secondary | ICD-10-CM | POA: Diagnosis not present

## 2021-07-26 DIAGNOSIS — H5203 Hypermetropia, bilateral: Secondary | ICD-10-CM | POA: Diagnosis not present

## 2021-08-11 ENCOUNTER — Inpatient Hospital Stay (HOSPITAL_COMMUNITY)
Admission: EM | Admit: 2021-08-11 | Discharge: 2021-08-15 | DRG: 193 | Disposition: A | Discharge status: Home Health | Payer: Medicare Other | Attending: Internal Medicine | Admitting: Internal Medicine

## 2021-08-11 ENCOUNTER — Emergency Department (HOSPITAL_COMMUNITY): Payer: Medicare Other

## 2021-08-11 ENCOUNTER — Encounter (HOSPITAL_COMMUNITY): Payer: Self-pay | Admitting: Internal Medicine

## 2021-08-11 ENCOUNTER — Ambulatory Visit
Admission: EM | Admit: 2021-08-11 | Discharge: 2021-08-11 | Disposition: A | Discharge status: Other Acute Inpatient Hospital | Payer: Medicare Other | Attending: Internal Medicine | Admitting: Internal Medicine

## 2021-08-11 ENCOUNTER — Other Ambulatory Visit: Payer: Self-pay

## 2021-08-11 DIAGNOSIS — Z885 Allergy status to narcotic agent status: Secondary | ICD-10-CM

## 2021-08-11 DIAGNOSIS — R7981 Abnormal blood-gas level: Secondary | ICD-10-CM | POA: Diagnosis not present

## 2021-08-11 DIAGNOSIS — Z808 Family history of malignant neoplasm of other organs or systems: Secondary | ICD-10-CM | POA: Diagnosis not present

## 2021-08-11 DIAGNOSIS — J9811 Atelectasis: Secondary | ICD-10-CM | POA: Diagnosis not present

## 2021-08-11 DIAGNOSIS — Z7901 Long term (current) use of anticoagulants: Secondary | ICD-10-CM

## 2021-08-11 DIAGNOSIS — Z800 Family history of malignant neoplasm of digestive organs: Secondary | ICD-10-CM

## 2021-08-11 DIAGNOSIS — Z886 Allergy status to analgesic agent status: Secondary | ICD-10-CM | POA: Diagnosis not present

## 2021-08-11 DIAGNOSIS — J189 Pneumonia, unspecified organism: Principal | ICD-10-CM | POA: Diagnosis present

## 2021-08-11 DIAGNOSIS — R5381 Other malaise: Secondary | ICD-10-CM

## 2021-08-11 DIAGNOSIS — J9601 Acute respiratory failure with hypoxia: Secondary | ICD-10-CM | POA: Diagnosis not present

## 2021-08-11 DIAGNOSIS — Z807 Family history of other malignant neoplasms of lymphoid, hematopoietic and related tissues: Secondary | ICD-10-CM

## 2021-08-11 DIAGNOSIS — Z8249 Family history of ischemic heart disease and other diseases of the circulatory system: Secondary | ICD-10-CM | POA: Diagnosis not present

## 2021-08-11 DIAGNOSIS — R54 Age-related physical debility: Secondary | ICD-10-CM | POA: Diagnosis not present

## 2021-08-11 DIAGNOSIS — I10 Essential (primary) hypertension: Secondary | ICD-10-CM | POA: Diagnosis present

## 2021-08-11 DIAGNOSIS — Z833 Family history of diabetes mellitus: Secondary | ICD-10-CM | POA: Diagnosis not present

## 2021-08-11 DIAGNOSIS — J440 Chronic obstructive pulmonary disease with acute lower respiratory infection: Secondary | ICD-10-CM | POA: Diagnosis not present

## 2021-08-11 DIAGNOSIS — M1990 Unspecified osteoarthritis, unspecified site: Secondary | ICD-10-CM | POA: Diagnosis not present

## 2021-08-11 DIAGNOSIS — Z8719 Personal history of other diseases of the digestive system: Secondary | ICD-10-CM | POA: Diagnosis not present

## 2021-08-11 DIAGNOSIS — Z66 Do not resuscitate: Secondary | ICD-10-CM | POA: Diagnosis present

## 2021-08-11 DIAGNOSIS — R0602 Shortness of breath: Secondary | ICD-10-CM | POA: Diagnosis not present

## 2021-08-11 DIAGNOSIS — Z20822 Contact with and (suspected) exposure to covid-19: Secondary | ICD-10-CM | POA: Diagnosis not present

## 2021-08-11 DIAGNOSIS — F419 Anxiety disorder, unspecified: Secondary | ICD-10-CM | POA: Diagnosis present

## 2021-08-11 DIAGNOSIS — J441 Chronic obstructive pulmonary disease with (acute) exacerbation: Secondary | ICD-10-CM | POA: Diagnosis present

## 2021-08-11 DIAGNOSIS — Z853 Personal history of malignant neoplasm of breast: Secondary | ICD-10-CM

## 2021-08-11 DIAGNOSIS — Z803 Family history of malignant neoplasm of breast: Secondary | ICD-10-CM

## 2021-08-11 DIAGNOSIS — R051 Acute cough: Secondary | ICD-10-CM

## 2021-08-11 DIAGNOSIS — Z801 Family history of malignant neoplasm of trachea, bronchus and lung: Secondary | ICD-10-CM

## 2021-08-11 DIAGNOSIS — Z806 Family history of leukemia: Secondary | ICD-10-CM

## 2021-08-11 DIAGNOSIS — R069 Unspecified abnormalities of breathing: Secondary | ICD-10-CM | POA: Diagnosis not present

## 2021-08-11 DIAGNOSIS — I480 Paroxysmal atrial fibrillation: Secondary | ICD-10-CM | POA: Diagnosis present

## 2021-08-11 DIAGNOSIS — Z743 Need for continuous supervision: Secondary | ICD-10-CM | POA: Diagnosis not present

## 2021-08-11 DIAGNOSIS — Z6829 Body mass index (BMI) 29.0-29.9, adult: Secondary | ICD-10-CM

## 2021-08-11 DIAGNOSIS — I4891 Unspecified atrial fibrillation: Secondary | ICD-10-CM | POA: Diagnosis present

## 2021-08-11 DIAGNOSIS — D72829 Elevated white blood cell count, unspecified: Secondary | ICD-10-CM

## 2021-08-11 DIAGNOSIS — R6889 Other general symptoms and signs: Secondary | ICD-10-CM | POA: Diagnosis not present

## 2021-08-11 DIAGNOSIS — R062 Wheezing: Secondary | ICD-10-CM | POA: Diagnosis not present

## 2021-08-11 DIAGNOSIS — Z87891 Personal history of nicotine dependence: Secondary | ICD-10-CM | POA: Diagnosis not present

## 2021-08-11 DIAGNOSIS — R0689 Other abnormalities of breathing: Secondary | ICD-10-CM | POA: Diagnosis not present

## 2021-08-11 DIAGNOSIS — Z79899 Other long term (current) drug therapy: Secondary | ICD-10-CM

## 2021-08-11 LAB — CBC WITH DIFFERENTIAL/PLATELET
Abs Immature Granulocytes: 0.13 10*3/uL — ABNORMAL HIGH (ref 0.00–0.07)
Basophils Absolute: 0 10*3/uL (ref 0.0–0.1)
Basophils Relative: 0 %
Eosinophils Absolute: 0 10*3/uL (ref 0.0–0.5)
Eosinophils Relative: 0 %
HCT: 45.8 % (ref 36.0–46.0)
Hemoglobin: 15.1 g/dL — ABNORMAL HIGH (ref 12.0–15.0)
Immature Granulocytes: 1 %
Lymphocytes Relative: 13 %
Lymphs Abs: 3.2 10*3/uL (ref 0.7–4.0)
MCH: 30.5 pg (ref 26.0–34.0)
MCHC: 33 g/dL (ref 30.0–36.0)
MCV: 92.5 fL (ref 80.0–100.0)
Monocytes Absolute: 0.9 10*3/uL (ref 0.1–1.0)
Monocytes Relative: 4 %
Neutro Abs: 19.3 10*3/uL — ABNORMAL HIGH (ref 1.7–7.7)
Neutrophils Relative %: 82 %
Platelets: 206 10*3/uL (ref 150–400)
RBC: 4.95 MIL/uL (ref 3.87–5.11)
RDW: 13.3 % (ref 11.5–15.5)
WBC: 23.5 10*3/uL — ABNORMAL HIGH (ref 4.0–10.5)
nRBC: 0 % (ref 0.0–0.2)

## 2021-08-11 LAB — BASIC METABOLIC PANEL
Anion gap: 11 (ref 5–15)
BUN: 13 mg/dL (ref 8–23)
CO2: 25 mmol/L (ref 22–32)
Calcium: 9.3 mg/dL (ref 8.9–10.3)
Chloride: 103 mmol/L (ref 98–111)
Creatinine, Ser: 0.76 mg/dL (ref 0.44–1.00)
GFR, Estimated: >60 mL/min (ref >60)
Glucose, Bld: 156 mg/dL — ABNORMAL HIGH (ref 70–99)
Potassium: 3.6 mmol/L (ref 3.5–5.1)
Sodium: 139 mmol/L (ref 135–145)

## 2021-08-11 LAB — RESP PANEL BY RT-PCR (FLU A&B, COVID) ARPGX2
Influenza A by PCR: NEGATIVE
Influenza B by PCR: NEGATIVE
SARS Coronavirus 2 by RT PCR: NEGATIVE

## 2021-08-11 LAB — PROCALCITONIN: Procalcitonin: <0.1 ng/mL

## 2021-08-11 LAB — LACTIC ACID, PLASMA
Lactic Acid, Venous: 2.5 mmol/L (ref 0.5–1.9)
Lactic Acid, Venous: 3.8 mmol/L (ref 0.5–1.9)

## 2021-08-11 LAB — PROTIME-INR
INR: 2.3 — ABNORMAL HIGH (ref 0.8–1.2)
Prothrombin Time: 25.6 seconds — ABNORMAL HIGH (ref 11.4–15.2)

## 2021-08-11 MED ORDER — ONDANSETRON HCL 4 MG/2ML IJ SOLN: 4.0000 mg | Freq: Four times a day (QID) | INTRAMUSCULAR | Status: DC | PRN

## 2021-08-11 MED ORDER — BISACODYL 5 MG PO TBEC
5.0000 mg | DELAYED_RELEASE_TABLET | Freq: Every day | ORAL | Status: DC | PRN
Start: 2021-08-11 — End: 2021-08-15

## 2021-08-11 MED ORDER — LACTATED RINGERS IV SOLN: INTRAVENOUS | Status: AC

## 2021-08-11 MED ORDER — PREDNISONE 20 MG PO TABS
40.0000 mg | ORAL_TABLET | Freq: Every day | ORAL | Status: DC
  Administered 2021-08-12 – 2021-08-13 (×2): 40 mg via ORAL
  Filled 2021-08-11 (×2): qty 2

## 2021-08-11 MED ORDER — ALBUTEROL SULFATE (2.5 MG/3ML) 0.083% IN NEBU
2.5000 mg | INHALATION_SOLUTION | RESPIRATORY_TRACT | Status: DC | PRN
  Administered 2021-08-14: 2.5 mg via RESPIRATORY_TRACT
  Filled 2021-08-11: qty 3

## 2021-08-11 MED ORDER — POLYETHYLENE GLYCOL 3350 17 G PO PACK: 17.0000 g | PACK | Freq: Every day | ORAL | Status: DC | PRN

## 2021-08-11 MED ORDER — SODIUM CHLORIDE 0.9 % IV BOLUS
500.0000 mL | Freq: Once | INTRAVENOUS | Status: AC
  Administered 2021-08-11: 500 mL via INTRAVENOUS

## 2021-08-11 MED ORDER — POLYVINYL ALCOHOL 1.4 % OP SOLN: 1.0000 [drp] | OPHTHALMIC | Status: DC | PRN

## 2021-08-11 MED ORDER — METHYLPREDNISOLONE SODIUM SUCC 125 MG IJ SOLR
60.0000 mg | Freq: Two times a day (BID) | INTRAMUSCULAR | Status: AC
  Administered 2021-08-11 – 2021-08-12 (×2): 60 mg via INTRAVENOUS
  Filled 2021-08-11 (×2): qty 2

## 2021-08-11 MED ORDER — SODIUM CHLORIDE 0.9 % IV SOLN
1.0000 g | Freq: Once | INTRAVENOUS | Status: AC
  Administered 2021-08-11: 1 g via INTRAVENOUS
  Filled 2021-08-11: qty 10

## 2021-08-11 MED ORDER — ACETAMINOPHEN 650 MG RE SUPP: 650.0000 mg | Freq: Four times a day (QID) | RECTAL | Status: DC | PRN

## 2021-08-11 MED ORDER — IPRATROPIUM-ALBUTEROL 0.5-2.5 (3) MG/3ML IN SOLN
3.0000 mL | Freq: Four times a day (QID) | RESPIRATORY_TRACT | Status: DC
  Administered 2021-08-11 – 2021-08-12 (×4): 3 mL via RESPIRATORY_TRACT
  Filled 2021-08-11 (×4): qty 3

## 2021-08-11 MED ORDER — ALBUTEROL SULFATE (2.5 MG/3ML) 0.083% IN NEBU
2.5000 mg | INHALATION_SOLUTION | Freq: Once | RESPIRATORY_TRACT | Status: AC
  Administered 2021-08-11: 2.5 mg via RESPIRATORY_TRACT

## 2021-08-11 MED ORDER — WARFARIN SODIUM 5 MG PO TABS
10.0000 mg | ORAL_TABLET | Freq: Once | ORAL | Status: AC
  Administered 2021-08-12: 10 mg via ORAL
  Filled 2021-08-11: qty 1
  Filled 2021-08-11: qty 2

## 2021-08-11 MED ORDER — SODIUM CHLORIDE 0.9 % IV SOLN
500.0000 mg | Freq: Once | INTRAVENOUS | Status: AC
  Administered 2021-08-11: 500 mg via INTRAVENOUS
  Filled 2021-08-11: qty 5

## 2021-08-11 MED ORDER — WARFARIN - PHARMACIST DOSING INPATIENT: Freq: Every day | Status: DC

## 2021-08-11 MED ORDER — SERTRALINE HCL 50 MG PO TABS
50.0000 mg | ORAL_TABLET | Freq: Every day | ORAL | Status: DC
  Administered 2021-08-12 – 2021-08-15 (×4): 50 mg via ORAL
  Filled 2021-08-11 (×4): qty 1

## 2021-08-11 MED ORDER — GUAIFENESIN ER 600 MG PO TB12
600.0000 mg | ORAL_TABLET | Freq: Two times a day (BID) | ORAL | Status: DC | PRN
  Administered 2021-08-13 – 2021-08-15 (×3): 600 mg via ORAL
  Filled 2021-08-11 (×3): qty 1

## 2021-08-11 MED ORDER — SODIUM CHLORIDE 0.9 % IV SOLN
2.0000 g | Freq: Two times a day (BID) | INTRAVENOUS | Status: DC
  Administered 2021-08-11 – 2021-08-12 (×2): 2 g via INTRAVENOUS
  Filled 2021-08-11 (×2): qty 2

## 2021-08-11 MED ORDER — DOCUSATE SODIUM 100 MG PO CAPS
100.0000 mg | ORAL_CAPSULE | Freq: Two times a day (BID) | ORAL | Status: DC
  Administered 2021-08-11 – 2021-08-14 (×6): 100 mg via ORAL
  Filled 2021-08-11 (×8): qty 1

## 2021-08-11 MED ORDER — HYDRALAZINE HCL 20 MG/ML IJ SOLN
5.0000 mg | INTRAMUSCULAR | Status: DC | PRN
  Administered 2021-08-13: 5 mg via INTRAVENOUS
  Filled 2021-08-11: qty 1

## 2021-08-11 MED ORDER — ACETAMINOPHEN 325 MG PO TABS
650.0000 mg | ORAL_TABLET | Freq: Four times a day (QID) | ORAL | Status: DC | PRN
  Administered 2021-08-15: 650 mg via ORAL
  Filled 2021-08-11: qty 2

## 2021-08-11 MED ORDER — METOPROLOL TARTRATE 25 MG PO TABS
25.0000 mg | ORAL_TABLET | Freq: Every day | ORAL | Status: DC
  Administered 2021-08-12: 25 mg via ORAL
  Filled 2021-08-11: qty 1

## 2021-08-11 MED ORDER — HYDROCHLOROTHIAZIDE 12.5 MG PO TABS
12.5000 mg | ORAL_TABLET | Freq: Every day | ORAL | Status: DC
  Administered 2021-08-12 – 2021-08-15 (×4): 12.5 mg via ORAL
  Filled 2021-08-11 (×4): qty 1

## 2021-08-11 MED ORDER — ALBUTEROL SULFATE HFA 108 (90 BASE) MCG/ACT IN AERS
4.0000 | INHALATION_SPRAY | Freq: Once | RESPIRATORY_TRACT | Status: AC
  Administered 2021-08-11: 4 via RESPIRATORY_TRACT
  Filled 2021-08-11: qty 6.7

## 2021-08-11 MED ORDER — ONDANSETRON HCL 4 MG PO TABS: 4.0000 mg | ORAL_TABLET | Freq: Four times a day (QID) | ORAL | Status: DC | PRN

## 2021-08-11 MED ORDER — PROPYLENE GLYCOL 0.6 % OP SOLN: 1.0000 [drp] | Freq: Every day | OPHTHALMIC | Status: DC | PRN

## 2021-08-11 MED ORDER — MORPHINE SULFATE (PF) 2 MG/ML IV SOLN: 2.0000 mg | INTRAVENOUS | Status: DC | PRN

## 2021-08-11 NOTE — Assessment & Plan Note (Addendum)
-  Continue Lopressor, HCTZ ?-Currently normotensive ?

## 2021-08-11 NOTE — Assessment & Plan Note (Addendum)
-  Paroxysmal A-fib.  Currently in normal sinus rhythm ?-Rate controlled with metoprolol ?-Continue Coumadin, pharmacy to dose ?

## 2021-08-11 NOTE — ED Provider Notes (Signed)
[facility]    CSN: [id number] Arrival date & time: 08/11/21  1002      History   Chief Complaint Chief Complaint  Patient presents with   Cough    HPI Teresa Frey is a 88 y.o. female.   Patient presents with persistent cough that has been present for approximately 2 weeks.  She reports that she had associated upper respiratory symptoms when symptoms first started but those have now resolved.  She developed a new fever last night with Tmax 101.  She has also had some associated intermittent shortness of breath but denies chest pain.  She does have a history of COPD but does not wear oxygen.  She has been prescribed albuterol inhaler that she has been taking with minimal improvement in symptoms.   Cough  Past Medical History:  Diagnosis Date   ANXIETY 04/07/2007   Breast cancer of upper-outer quadrant of right female breast (HCC) 01/11/2016   COLONIC POLYPS, HX OF 04/02/2007   adenomatous   DIVERTICULITIS, HX OF 04/02/2007   Diverticulosis    DJD (degenerative joint disease)    Hemorrhoids    HYPERTENSION 04/02/2007   OSTEOARTHRITIS, SEVERE 04/07/2007   Paget's disease of bone    lumbar and sacrum   WEIGHT GAIN 11/13/2007    Patient Active Problem List   Diagnosis Date Noted   Atrial fibrillation with RVR (HCC) 09/28/2020   Multiple falls 11/13/2019   COPD (chronic obstructive pulmonary disease) (HCC) 09/21/2019   Educated about COVID-19 virus infection 09/09/2019   Spondylosis without myelopathy or radiculopathy, lumbar region 02/12/2019   Spondylolisthesis of lumbar region 02/12/2019   Pain of left hip joint 12/23/2018   Primary osteoarthritis of left knee 12/23/2018   Periprosthetic fracture around internal prosthetic left hip joint (HCC) 04/19/2018   Long term (current) use of anticoagulants [Z79.01] 08/14/2017   Genetic testing 04/01/2016   possible sleep apnea by history 02/12/2016   A-fib (HCC) 02/11/2016   Surgical wound infection 02/11/2016    Breast cancer of upper-outer quadrant of right female breast (HCC) 01/11/2016   Impaired glucose tolerance 07/28/2014   Anxiety disorder, unspecified 04/07/2007   Osteoarthritis 04/07/2007   Essential hypertension 04/02/2007   History of colonic polyps 04/02/2007   DIVERTICULITIS, HX OF 04/02/2007    Past Surgical History:  Procedure Laterality Date   APPENDECTOMY     BREAST LUMPECTOMY WITH RADIOACTIVE SEED LOCALIZATION Right 02/08/2016   Procedure: RIGHT BREAST LUMPECTOMY WITH RADIOACTIVE SEED LOCALIZATION;  Surgeon: Claud Kelp, MD;  Location: [facility];  Service: General;  Laterality: Right;   ORIF FEMUR FRACTURE Left 04/21/2018   Procedure: OPEN REDUCTION INTERNAL FIXATION (ORIF) PERI PROSTHETIC FEMUR FRACTURE;  Surgeon: Tarry Kos, MD;  Location: [facility];  Service: Orthopedics;  Laterality: Left;   TONSILLECTOMY     TOTAL HIP ARTHROPLASTY  1999, left hip    2006 right hip   TUBAL LIGATION      OB History   No obstetric history on file.      Home Medications    Prior to Admission medications   Medication Sig Start Date End Date Taking? Authorizing Provider  acetaminophen (TYLENOL) 325 MG tablet Take 1-2 tablets (325-650 mg total) by mouth every 6 (six) hours as needed for mild pain (pain score 1-3 or temp > 100.5). 04/24/18   Kathlen Mody, MD  albuterol (VENTOLIN HFA) 108 (90 Base) MCG/ACT inhaler Inhale 2 puffs into the lungs every 6 (six) hours as needed for wheezing or shortness  of breath. 05/10/21   [place], [name], MD  anastrozole (ARIMIDEX) 1 MG tablet TAKE 1 TABLET BY MOUTH DAILY 06/14/20   Malachy Mood, MD  Ascorbic Acid (VITAMIN C) 1000 MG tablet Take 1,000 mg by mouth daily.    [provider]  BIOTIN 5000 PO Take 1 tablet by mouth daily.    [provider]  Calcium Carb-Cholecalciferol (CALCIUM 1000 + D PO) Take 1 tablet by mouth daily.    [provider]  calcium-vitamin D (OSCAL WITH D) 500-200 MG-UNIT tablet Take 1  tablet by mouth 3 (three) times daily. Patient taking differently: Take 1 tablet by mouth daily. 04/21/18   Tarry Kos, MD  hydrochlorothiazide (MICROZIDE) 12.5 MG capsule TAKE 1 CAPSULE BY MOUTH DAILY. 03/13/21   [place], [name], MD  methylPREDNISolone (MEDROL DOSEPAK) 4 MG TBPK tablet Use as directed 12/02/19   Tarry Kos, MD  metoprolol tartrate (LOPRESSOR) 25 MG tablet TAKE 1 TABLET BY MOUTH DAILY. 02/03/21   Rollene Rotunda, MD  Multiple Vitamins-Minerals (CENTRUM SILVER ADULT 50+) TABS Take 1 tablet by mouth daily.    [provider]  omega-3 acid ethyl esters (LOVAZA) 1 g capsule Take 1 g by mouth daily.     [provider]  sertraline (ZOLOFT) 50 MG tablet Take 1 tablet (50 mg total) by mouth daily. 05/10/21   [place], [name], MD  vitamin B-12 (CYANOCOBALAMIN) 1000 MCG tablet Take 1,000 mcg by mouth daily.    [provider]  warfarin (COUMADIN) 5 MG tablet TAKE 1 TO 2 TABLETS BY MOUTH DAILY AS DIRECTED BY THE COUMADIN CLINIC 06/01/21   Rollene Rotunda, MD  zinc sulfate 220 (50 Zn) MG capsule Take 1 capsule (220 mg total) by mouth daily. 04/21/18   Tarry Kos, MD    Family History Family History  Problem Relation Age of Onset   Pancreatic cancer Mother 21       d. 34   Cancer Father 30       hodgkin's lymphoma and leukemia; d. 81   Colon cancer Brother        dx. late 25s; smoker   Pancreatic cancer Brother 25       d. 41; smoker   Lung cancer Brother 29       smoker   Heart attack Brother        d. late 53s   Colon cancer Paternal Aunt    Breast cancer Daughter 86       negative genetic testing in 2016   Skin cancer Daughter        non-melanoma type; +sun exposure   Kidney failure Son 74       +EtOH abuse resulting in liver, kidney, and pancreas failure   Diabetes Maternal Uncle        d. later age   Heart disease Neg Hx        family   Esophageal cancer Neg Hx    Rectal cancer Neg Hx    Stomach cancer Neg Hx     Social  History Social History   Tobacco Use   Smoking status: Former    Packs/day: 1.00    Years: 27.00    Pack years: 27.00    Types: Cigarettes    Quit date: 06/04/1978    Years since quitting: 43.2   Smokeless tobacco: Never  Vaping Use   Vaping Use: Never used  Substance Use Topics   Alcohol use: Yes    Alcohol/week: 7.0  standard drinks    Types: 7 Glasses of wine per week    Comment: 1-2 glasses wine after dinner, but not necessarily each day   Drug use: No     Allergies   Hydrocodone-acetaminophen and Tramadol   Review of Systems Review of Systems Per HPI  Physical Exam Triage Vital Signs ED Triage Vitals  Enc Vitals Group     BP 08/11/21 1024 (!) 161/77     Pulse Rate 08/11/21 1024 71     Resp 08/11/21 1024 18     Temp 08/11/21 1024 99.3 F (37.4 C)     Temp Source 08/11/21 1024 Oral     SpO2 08/11/21 1024 91 %     Weight --      Height --      Head Circumference --      Peak Flow --      Pain Score 08/11/21 1025 0     Pain Loc --      Pain Edu? --      Excl. in GC? --    No data found.  Updated Vital Signs BP (!) 161/77 (BP Location: Left Arm)   Pulse 71   Temp 99.3 F (37.4 C) (Oral)   Resp 18   SpO2 (!) 89%   Visual Acuity Right Eye Distance:   Left Eye Distance:   Bilateral Distance:    Right Eye Near:   Left Eye Near:    Bilateral Near:     Physical Exam Constitutional:      General: She is not in acute distress.    Appearance: Normal appearance. She is not toxic-appearing or diaphoretic.  HENT:     Head: Normocephalic and atraumatic.  Eyes:     Extraocular Movements: Extraocular movements intact.     Conjunctiva/sclera: Conjunctivae normal.     Pupils: Pupils are equal, round, and reactive to light.  Cardiovascular:     Rate and Rhythm: Normal rate and regular rhythm.     Pulses: Normal pulses.     Heart sounds: Normal heart sounds.  Pulmonary:     Effort: Pulmonary effort is normal. No respiratory distress.     Breath sounds:  No stridor. Wheezing present. No rhonchi or rales.  Skin:    General: Skin is warm and dry.  Neurological:     General: No focal deficit present.     Mental Status: She is alert and oriented to person, place, and time. Mental status is at baseline.  Psychiatric:        Mood and Affect: Mood normal.        Behavior: Behavior normal.        Thought Content: Thought content normal.        Judgment: Judgment normal.     UC Treatments / Results  Labs (all labs ordered are listed, but only abnormal results are displayed) Labs Reviewed - No data to display  EKG   Radiology No results found.  Procedures Procedures (including critical care time)  Medications Ordered in UC Medications  albuterol (PROVENTIL) (2.5 MG/3ML) 0.083% nebulizer solution 2.5 mg (2.5 mg Nebulization Given 08/11/21 1049)    Initial Impression / Assessment and Plan / UC Course  I have reviewed the triage vital signs and the nursing notes.  Pertinent labs & imaging results that were available during my care of the patient were reviewed by me and considered in my medical decision making (see chart for details).     Patient's oxygen saturation was ranging  from 89 to 91% during initial triage and physical exam.  Patient has wheezing on exam.  Albuterol nebulizer solution was administered in urgent care with mild improvement.  Advised patient that she will need to go to the hospital due to oxygen saturation for further evaluation and management.  Patient was agreeable with plan.  Patient left via EMS transport. Final Clinical Impressions(s) / UC Diagnoses   Final diagnoses:  Low oxygen saturation  Shortness of breath  Wheezing  Acute cough     Discharge Instructions      Patient sent to hospital via EMS.     ED Prescriptions   None    PDMP not reviewed this encounter.   Gustavus Bryant, [place] 08/11/21 [phone]

## 2021-08-11 NOTE — ED Notes (Signed)
Patient is being discharged from the Urgent Care and sent to the Emergency Department via [facility] . Per [facility] FNP, patient is in need of higher level of care due to hypoxia. Patient is aware and verbalizes understanding of plan of care.  ?Vitals:  ? 08/11/21 1024 08/11/21 1044  ?BP: (!) 161/77   ?Pulse: 71   ?Resp: 18   ?Temp: 99.3 ?F (37.4 ?C)   ?SpO2: 91% (!) 89%  ? ? ?

## 2021-08-11 NOTE — ED Triage Notes (Signed)
Pt c/o cough, fever 115fat home, headache,  ? ?Denies sore throat, nasal congestion, ear ache, nausea, vomiting, diarrhea, constipation, body aches or chills  ? ?Onset ~ 2 weeks ago  ?

## 2021-08-11 NOTE — ED Notes (Signed)
Ems at bedside  

## 2021-08-11 NOTE — Assessment & Plan Note (Addendum)
Code status is DNR ?

## 2021-08-11 NOTE — Assessment & Plan Note (Addendum)
-  With possible concurrent pneumonia ?-Patient's shortness of breath and productive cough are most likely caused by acute COPD exacerbation.  ?-She has history of COPD but has never required O2; she was in the 70s at [facility] and 89% here  ?-She does not have fever here but did at home PTA; she has significant leukocytosis.  ?-Chest x-ray suspicious for pneumonia ?-Continue ceftriaxone azithromycin ?-Continue mucolytic's ?-She has been weaned to room air, hopefully she will not require oxygen on for home ?-Noted to have wheezing, rhonchi today, started on scheduled DuoNeb, Pulmicort, steroids changed to IV ?

## 2021-08-11 NOTE — ED Provider Notes (Addendum)
[facility] EMERGENCY DEPARTMENT Provider Note   CSN: [id number] Arrival date & time: 08/11/21  1207     History  Chief Complaint  Patient presents with   Shortness of Breath    Teresa Frey is a 88 y.o. female.  The history is provided by the patient.  Shortness of Breath Severity:  Moderate Onset quality:  Gradual Duration:  2 weeks Timing:  Constant Progression:  Worsening Chronicity:  New Context: URI   Relieved by:  Inhaler Worsened by:  Coughing and activity Associated symptoms: cough and fever   Associated symptoms: no abdominal pain, no chest pain, no claudication, no diaphoresis, no ear pain, no headaches, no hemoptysis, no neck pain, no PND, no rash, no sore throat, no sputum production, no syncope, no swollen glands, no vomiting and no wheezing   Risk factors comment:  COPD     Home Medications Prior to Admission medications   Medication Sig Start Date End Date Taking? Authorizing Provider  acetaminophen (TYLENOL) 325 MG tablet Take 1-2 tablets (325-650 mg total) by mouth every 6 (six) hours as needed for mild pain (pain score 1-3 or temp > 100.5). 04/24/18   Hosie Poisson, MD  albuterol (VENTOLIN HFA) 108 (90 Base) MCG/ACT inhaler Inhale 2 puffs into the lungs every 6 (six) hours as needed for wheezing or shortness of breath. 05/10/21   [name], [name], MD  anastrozole (ARIMIDEX) 1 MG tablet TAKE 1 TABLET BY MOUTH DAILY 06/14/20   Truitt Merle, MD  Ascorbic Acid (VITAMIN C) 1000 MG tablet Take 1,000 mg by mouth daily.    [provider]  BIOTIN 5000 PO Take 1 tablet by mouth daily.    [provider]  Calcium Carb-Cholecalciferol (CALCIUM 1000 + D PO) Take 1 tablet by mouth daily.    [provider]  calcium-vitamin D (OSCAL WITH D) 500-200 MG-UNIT tablet Take 1 tablet by mouth 3 (three) times daily. Patient taking differently: Take 1 tablet by mouth daily. 04/21/18   Leandrew Koyanagi, MD  hydrochlorothiazide (MICROZIDE)  12.5 MG capsule TAKE 1 CAPSULE BY MOUTH DAILY. 03/13/21   [name], [name], MD  methylPREDNISolone (MEDROL DOSEPAK) 4 MG TBPK tablet Use as directed 12/02/19   Leandrew Koyanagi, MD  metoprolol tartrate (LOPRESSOR) 25 MG tablet TAKE 1 TABLET BY MOUTH DAILY. 02/03/21   [name], MD  Multiple Vitamins-Minerals (CENTRUM SILVER ADULT 50+) TABS Take 1 tablet by mouth daily.    [provider]  omega-3 acid ethyl esters (LOVAZA) 1 g capsule Take 1 g by mouth daily.     [provider]  sertraline (ZOLOFT) 50 MG tablet Take 1 tablet (50 mg total) by mouth daily. 05/10/21   [name], [name], MD  vitamin B-12 (CYANOCOBALAMIN) 1000 MCG tablet Take 1,000 mcg by mouth daily.    [provider]  warfarin (COUMADIN) 5 MG tablet TAKE 1 TO 2 TABLETS BY MOUTH DAILY AS DIRECTED BY THE COUMADIN CLINIC 06/01/21   [name], MD  zinc sulfate 220 (50 Zn) MG capsule Take 1 capsule (220 mg total) by mouth daily. 04/21/18   Leandrew Koyanagi, MD      Allergies    Hydrocodone-acetaminophen and Tramadol    Review of Systems   Review of Systems  Constitutional:  Positive for fever. Negative for diaphoresis.  HENT:  Negative for ear pain and sore throat.   Respiratory:  Positive for cough and shortness of breath. Negative for hemoptysis, sputum production and wheezing.  Cardiovascular:  Negative for chest pain, claudication, syncope and PND.  Gastrointestinal:  Negative for abdominal pain and vomiting.  Musculoskeletal:  Negative for neck pain.  Skin:  Negative for rash.  Neurological:  Negative for headaches.   Physical Exam Updated Vital Signs BP (!) 143/57    Pulse 81    Resp 16    SpO2 94%  Physical Exam Vitals and nursing note reviewed.  Constitutional:      General: She is not in acute distress.    Appearance: She is well-developed. She is not ill-appearing.  HENT:     Head: Normocephalic and atraumatic.  Eyes:     Extraocular Movements: Extraocular movements intact.      Conjunctiva/sclera: Conjunctivae normal.     Pupils: Pupils are equal, round, and reactive to light.  Cardiovascular:     Rate and Rhythm: Normal rate and regular rhythm.     Pulses: Normal pulses.     Heart sounds: No murmur heard. Pulmonary:     Effort: Tachypnea present. No respiratory distress.     Breath sounds: Decreased breath sounds and wheezing present.  Abdominal:     Palpations: Abdomen is soft.     Tenderness: There is no abdominal tenderness.  Musculoskeletal:        General: No swelling.     Cervical back: Normal range of motion and neck supple.     Right lower leg: No edema.     Left lower leg: No edema.  Skin:    General: Skin is warm and dry.     Capillary Refill: Capillary refill takes less than 2 seconds.  Neurological:     General: No focal deficit present.     Mental Status: She is alert.  Psychiatric:        Mood and Affect: Mood normal.    ED Results / Procedures / Treatments   Labs (all labs ordered are listed, but only abnormal results are displayed) Labs Reviewed  CBC WITH DIFFERENTIAL/PLATELET - Abnormal; Notable for the following components:      Result Value   WBC 23.5 (*)    Hemoglobin 15.1 (*)    Neutro Abs 19.3 (*)    Abs Immature Granulocytes 0.13 (*)    All other components within normal limits  RESP PANEL BY RT-PCR (FLU A&B, COVID) ARPGX2  CULTURE, BLOOD (ROUTINE X 2)  CULTURE, BLOOD (ROUTINE X 2)  BASIC METABOLIC PANEL  PROTIME-INR  LACTIC ACID, PLASMA  LACTIC ACID, PLASMA    EKG EKG Interpretation  Date/Time:  Friday August 11 2021 12:12:08 EST Ventricular Rate:  82 PR Interval:  167 QRS Duration: 102 QT Interval:  487 QTC Calculation: 569 R Axis:   -34 Text Interpretation: Sinus rhythm Left axis deviation Confirmed by Lennice Sites ([id number]) on 08/11/2021 12:21:59 PM  Radiology DG Chest Portable 1 View  Result Date: 08/11/2021 CLINICAL DATA:  Shortness of breath, cough for 2 weeks EXAM: PORTABLE CHEST 1 VIEW COMPARISON:   06/17/2018 FINDINGS: Bilateral mild interstitial thickening. Left basilar atelectasis. No pleural effusion or pneumothorax. Heart and mediastinal contours are unremarkable. No acute osseous abnormality. IMPRESSION: 1. Bilateral interstitial thickening which may reflect mild interstitial edema versus infection. Electronically Signed   By: Kathreen Devoid M.D.   On: 08/11/2021 12:25    Procedures .Critical Care Performed by: Lennice Sites, DO Authorized by: Lennice Sites, DO   Critical care provider statement:    Critical care time (minutes):  35   Critical care was necessary to treat or prevent  imminent or life-threatening deterioration of the following conditions:  Respiratory failure   Critical care was time spent personally by me on the following activities:  Blood draw for specimens, development of treatment plan with patient or surrogate, discussions with primary provider, evaluation of patient's response to treatment, examination of patient, obtaining history from patient or surrogate, ordering and performing treatments and interventions, ordering and review of laboratory studies, ordering and review of radiographic studies, pulse oximetry, re-evaluation of patient's condition and review of old charts   Care discussed with: admitting provider      Medications Ordered in ED Medications  sodium chloride 0.9 % bolus 500 mL (has no administration in time range)  cefTRIAXone (ROCEPHIN) 1 g in sodium chloride 0.9 % 100 mL IVPB (has no administration in time range)  azithromycin (ZITHROMAX) 500 mg in sodium chloride 0.9 % 250 mL IVPB (has no administration in time range)  albuterol (VENTOLIN HFA) 108 (90 Base) MCG/ACT inhaler 4 puff (4 puffs Inhalation Given 08/11/21 1238)    ED Course/ Medical Decision Making/ A&P                           Medical Decision Making Amount and/or Complexity of Data Reviewed Labs: ordered. Radiology: ordered.  Risk Prescription drug management. Decision  regarding hospitalization.   Teresa Frey is here for shortness of breath and cough.  Patient here from urgent care.  History of COPD, high cholesterol.  Breast cancer on maintenance medicine.  2 weeks of worsening shortness of breath worse over the last 2 days with increased shortness of breath with exertion and cough.  At urgent care oxygenation dropped in the 70s with ambulation.  She got albuterol, Atrovent, Solu-Medrol, magnesium with EMS.  She appears fairly comfortable on exam.  She states that she had a fever this morning.  She had some sputum production.  Denies any chest pain, abdominal pain.  She is got diminished breath sounds with scattered wheezing and still with poor air movement.  But her work of breathing appears to be improved.  Her vital signs otherwise are normal on 2 L of oxygen.  She does not have fever.  Blood pressure is normal.  Differential diagnosis is likely COPD exacerbation in the setting of viral process and now may be postviral pneumonia.  EKG shows sinus rhythm.  No ischemic changes.  She is not having any chest pain and doubt acute coronary syndrome.  No concern for pulmonary embolism.  She is on Coumadin for A-fib.  Given new oxygen requirement and suspected COPD exacerbation anticipate admission.  We will get basic labs including CBC, BMP, COVID test, chest x-ray.  We will give her 4 more puffs of albuterol.  Per my review and interpretation of lab work patient with white count of 23.  Chest x-ray concerning for infectious process per my review and interpretation.  Radiology also suspects fluid versus infection.  Clinically I think she has postviral pneumonia with COPD exacerbation.  She is not appear to be volume overloaded.  We will add blood cultures and lactic acid but given her vital signs have low suspicion for sepsis.  We will start Rocephin and Zithromax.  She seems to have improved following breathing treatments.  Will admit to medicine for further care.  This  chart was dictated using voice recognition software.  Despite best efforts to proofread,  errors can occur which can change the documentation meaning.  Final Clinical Impression(s) / ED Diagnoses Final diagnoses:  Acute respiratory failure with hypoxia ([employer])  COPD exacerbation ([place])  Community acquired pneumonia, unspecified laterality    Rx / DC Orders ED Discharge Orders     None         Lennice Sites, DO 08/11/21 1252    Lennice Sites, DO 08/11/21 1255

## 2021-08-11 NOTE — Discharge Instructions (Addendum)
Patient sent to hospital via EMS.  

## 2021-08-11 NOTE — ED Notes (Signed)
Ems called per provider  

## 2021-08-11 NOTE — H&P (Signed)
History and Physical    Patient: Teresa Frey EUM:[id number] DOB: 01-Jan-1930 DOA: 08/11/2021 DOS: the patient was seen and examined on 08/11/2021 PCP: [name], [name], MD  Patient coming from: Home - lives alone; NOK: Daughter, Simone Curia, [phone]   Chief Complaint: SOB  HPI: Teresa Frey is a 88 y.o. female with medical history significant of remote breast cancer, afib on Coumadin, and HTN presenting with SOB.  She reports that 2 weeks ago got a cold.  It went from her nose to throat to chest.  Everything else cleared up but she had a cough that won't go away.  She was coughing up yellow-green sputum but now it is just clear.  No home O2, uses an inhaler at home prn.  She has been feeling SOB with exertion.  She has not checked her temp until this AM - she felt lousy this AM and her temp was 101.    ER Course:  Likely COPD exacerbation, post-viral PNA.  Illness x 2 weeks, not improving.  70s at Urgent Care, stable on 2L [place] O2.  Fever to 101.  WBC 24, CXR bronchitis.  Given Rocephin, Azithro, Albuterol.  COVID pending.     Review of Systems: As mentioned in the history of present illness. All other systems reviewed and are negative. Past Medical History:  Diagnosis Date   ANXIETY 04/07/2007   Breast cancer of upper-outer quadrant of right female breast ([place]) 01/11/2016   COLONIC POLYPS, HX OF 04/02/2007   adenomatous   DIVERTICULITIS, HX OF 04/02/2007   Diverticulosis    DJD (degenerative joint disease)    Hemorrhoids    HYPERTENSION 04/02/2007   OSTEOARTHRITIS, SEVERE 04/07/2007   Paget's disease of bone    lumbar and sacrum   WEIGHT GAIN 11/13/2007   Past Surgical History:  Procedure Laterality Date   APPENDECTOMY     BREAST LUMPECTOMY WITH RADIOACTIVE SEED LOCALIZATION Right 02/08/2016   Procedure: RIGHT BREAST LUMPECTOMY WITH RADIOACTIVE SEED LOCALIZATION;  Surgeon: Fanny Skates, MD;  Location: [facility];  Service: General;  Laterality: Right;   ORIF  FEMUR FRACTURE Left 04/21/2018   Procedure: OPEN REDUCTION INTERNAL FIXATION (ORIF) PERI PROSTHETIC FEMUR FRACTURE;  Surgeon: Leandrew Koyanagi, MD;  Location: [facility];  Service: Orthopedics;  Laterality: Left;   TONSILLECTOMY     TOTAL HIP ARTHROPLASTY  1999, left hip    2006 right hip   TUBAL LIGATION     Social History:  reports that she quit smoking about 43 years ago. Her smoking use included cigarettes. She has a 27.00 pack-year smoking history. She has never used smokeless tobacco. She reports current alcohol use of about 7.0 standard drinks per week. She reports that she does not use drugs.  Allergies  Allergen Reactions   Hydrocodone-Acetaminophen Other (See Comments)    UNK reaction   Tramadol     Loss of control    Family History  Problem Relation Age of Onset   Pancreatic cancer Mother 27       d. 105   Cancer Father 74       hodgkin's lymphoma and leukemia; d. 44   Colon cancer Brother        dx. late 72s; smoker   Pancreatic cancer Brother 60       d. 43; smoker   Lung cancer Brother 15       smoker   Heart attack Brother        d. late 4s  Colon cancer Paternal Aunt    Breast cancer Daughter 48       negative genetic testing in 2016   Skin cancer Daughter        non-melanoma type; +sun exposure   Kidney failure Son 4       +EtOH abuse resulting in liver, kidney, and pancreas failure   Diabetes Maternal Uncle        d. later age   Heart disease Neg Hx        family   Esophageal cancer Neg Hx    Rectal cancer Neg Hx    Stomach cancer Neg Hx     Prior to Admission medications   Medication Sig Start Date End Date Taking? Authorizing Provider  acetaminophen (TYLENOL) 325 MG tablet Take 1-2 tablets (325-650 mg total) by mouth every 6 (six) hours as needed for mild pain (pain score 1-3 or temp > 100.5). 04/24/18   Hosie Poisson, MD  albuterol (VENTOLIN HFA) 108 (90 Base) MCG/ACT inhaler Inhale 2 puffs into the lungs every 6 (six) hours as needed for wheezing or  shortness of breath. 05/10/21   [name], [name], MD  anastrozole (ARIMIDEX) 1 MG tablet TAKE 1 TABLET BY MOUTH DAILY 06/14/20   Truitt Merle, MD  Ascorbic Acid (VITAMIN C) 1000 MG tablet Take 1,000 mg by mouth daily.    [provider]  BIOTIN 5000 PO Take 1 tablet by mouth daily.    [provider]  Calcium Carb-Cholecalciferol (CALCIUM 1000 + D PO) Take 1 tablet by mouth daily.    [provider]  calcium-vitamin D (OSCAL WITH D) 500-200 MG-UNIT tablet Take 1 tablet by mouth 3 (three) times daily. Patient taking differently: Take 1 tablet by mouth daily. 04/21/18   Leandrew Koyanagi, MD  hydrochlorothiazide (MICROZIDE) 12.5 MG capsule TAKE 1 CAPSULE BY MOUTH DAILY. 03/13/21   [name], [name], MD  methylPREDNISolone (MEDROL DOSEPAK) 4 MG TBPK tablet Use as directed 12/02/19   Leandrew Koyanagi, MD  metoprolol tartrate (LOPRESSOR) 25 MG tablet TAKE 1 TABLET BY MOUTH DAILY. 02/03/21   [name], MD  Multiple Vitamins-Minerals (CENTRUM SILVER ADULT 50+) TABS Take 1 tablet by mouth daily.    [provider]  omega-3 acid ethyl esters (LOVAZA) 1 g capsule Take 1 g by mouth daily.     [provider]  sertraline (ZOLOFT) 50 MG tablet Take 1 tablet (50 mg total) by mouth daily. 05/10/21   [name], [name], MD  vitamin B-12 (CYANOCOBALAMIN) 1000 MCG tablet Take 1,000 mcg by mouth daily.    [provider]  warfarin (COUMADIN) 5 MG tablet TAKE 1 TO 2 TABLETS BY MOUTH DAILY AS DIRECTED BY THE COUMADIN CLINIC 06/01/21   [name], MD  zinc sulfate 220 (50 Zn) MG capsule Take 1 capsule (220 mg total) by mouth daily. 04/21/18   Leandrew Koyanagi, MD    Physical Exam: Vitals:   08/11/21 1315 08/11/21 1330 08/11/21 1345 08/11/21 1400  BP: (!) 128/54 (!) 146/54 (!) 132/56 139/69  Pulse: 89 89 89 88  Resp: 20 16 (!) 21 15  SpO2: 95% 96% 94% 94%   General:  Appears calm and comfortable and is in NAD, appears much younger than stated age Eyes:  PERRL, EOMI, normal  lids, iris ENT:  grossly normal hearing, lips & tongue, mmm; appropriate dentition Neck:  no LAD, masses or thyromegaly Cardiovascular:  RRR, no m/r/g. No LE edema.  Respiratory:   Diminished breath sounds with diffuse  expiratory wheezing.  Normal to mildly increased respiratory effort, on [place] O2. Abdomen:  soft, NT, ND Skin:  no rash or induration seen on limited exam Musculoskeletal:  grossly normal tone BUE/BLE, good ROM, no bony abnormality Psychiatric:  grossly normal mood and affect, speech fluent and appropriate, AOx3 Neurologic:  CN 2-12 grossly intact, moves all extremities in coordinated fashion   Radiological Exams on Admission: Independently reviewed - see discussion in A/P where applicable  DG Chest Portable 1 View  Result Date: 08/11/2021 CLINICAL DATA:  Shortness of breath, cough for 2 weeks EXAM: PORTABLE CHEST 1 VIEW COMPARISON:  06/17/2018 FINDINGS: Bilateral mild interstitial thickening. Left basilar atelectasis. No pleural effusion or pneumothorax. Heart and mediastinal contours are unremarkable. No acute osseous abnormality. IMPRESSION: 1. Bilateral interstitial thickening which may reflect mild interstitial edema versus infection. Electronically Signed   By: Kathreen Devoid M.D.   On: 08/11/2021 12:25    EKG: Independently reviewed.  NSR with rate 82; nonspecific ST changes with no evidence of acute ischemia   Labs on Admission: I have personally reviewed the available labs and imaging studies at the time of the admission.  Pertinent labs:    Glucose 156 WBC 23.5 INR 2.3 COVID/flu negative    Assessment and Plan: * COPD with acute exacerbation (HCC) Acute on chronic respiratory failure associated with a COPD exacerbation -Patient's shortness of breath and productive cough are most likely caused by acute COPD exacerbation.  -She has history of COPD but has never required O2; she was in the 70s at [facility] and 89% here  -She does not have fever here but did at home  PTA; she has significant leukocytosis.  -Chest x-ray is not consistent with pneumonia -will observe for now and attempt to wean off [place] O2 if possible -Nebulizers: scheduled Duoneb and prn albuterol -Solu-Medrol 60 mg IV BID -> Prednisone 40 mg PO daily -IV Cefepime -Coordinated care with [facility] team/PT/OT/Nutrition/RT consults  DNR (do not resuscitate) -I have discussed code status with the patient and she would not desire resuscitation and would prefer to die a natural death should that situation arise. -She will need a gold out of facility DNR form at the time of discharge -She is, however, quite spry and energetic and this does not appear to be an imminent situation  A-fib ([place]) -Rate controlled with metoprolol -Continue Coumadin, pharmacy to dose  Essential hypertension -Continue Lopressor, HCTZ -Will add prn IV hydralazine  Anxiety disorder, unspecified -Continue Zoloft     Advance Care Planning:   Code Status: DNR   Consults: Nutrition; PT/OT; TOC team  DVT Prophylaxis: Coumadin  Family Communication: None present; she is capable of communicating with her family at this time  Severity of Illness: The appropriate patient status for this patient is OBSERVATION. Observation status is judged to be reasonable and necessary in order to provide the required intensity of service to ensure the patient's safety. The patient's presenting symptoms, physical exam findings, and initial radiographic and laboratory data in the context of their medical condition is felt to place them at decreased risk for further clinical deterioration. Furthermore, it is anticipated that the patient will be medically stable for discharge from the hospital within 2 midnights of admission.   Author: Karmen Bongo, MD 08/11/2021 2:23 PM  For on call review [email or web address].

## 2021-08-11 NOTE — Progress Notes (Signed)
ANTICOAGULATION CONSULT NOTE - Initial Consult ? ?Pharmacy Consult for warfarin ?Indication: atrial fibrillation ? ?Allergies  ?Allergen Reactions  ? Hydrocodone-Acetaminophen Other (See Comments)  ?  UNK reaction  ? Tramadol   ?  Loss of control  ? ? ?Vital Signs: ?Temp: 99.3 ?F (37.4 ?C) (03/10 1024) ?Temp Source: Oral (03/10 1024) ?BP: 139/69 (03/10 1400) ?Pulse Rate: 88 (03/10 1400) ? ?Labs: ?Recent Labs  ?  08/11/21 ?1220  ?HGB 15.1*  ?HCT 45.8  ?PLT 206  ?LABPROT 25.6*  ?INR 2.3*  ?CREATININE 0.76  ? ? ?CrCl cannot be calculated (Unknown ideal weight.). ? ? ?Medical History: ?Past Medical History:  ?Diagnosis Date  ? ANXIETY 04/07/2007  ? Breast cancer of upper-outer quadrant of right female breast ([place]) 01/11/2016  ? COLONIC POLYPS, HX OF 04/02/2007  ? adenomatous  ? DIVERTICULITIS, HX OF 04/02/2007  ? Diverticulosis   ? DJD (degenerative joint disease)   ? Hemorrhoids   ? HYPERTENSION 04/02/2007  ? OSTEOARTHRITIS, SEVERE 04/07/2007  ? Paget's disease of bone   ? lumbar and sacrum  ? WEIGHT GAIN 11/13/2007  ? ? ?Medications:  ?(Not in a hospital admission)  ?Scheduled:  ? docusate sodium  100 mg Oral BID  ? [START ON 08/12/2021] hydrochlorothiazide  12.5 mg Oral Daily  ? ipratropium-albuterol  3 mL Nebulization Q6H  ? methylPREDNISolone (SOLU-MEDROL) injection  60 mg Intravenous Q12H  ? Followed by  ? [START ON 08/12/2021] predniSONE  40 mg Oral Q breakfast  ? [START ON 08/12/2021] metoprolol tartrate  25 mg Oral Daily  ? [START ON 08/12/2021] sertraline  50 mg Oral Daily  ? ?Infusions:  ? azithromycin (ZITHROMAX) 500 MG IVPB (Vial-Mate [employer]) 500 mg (08/11/21 1423)  ? ceFEPime (MAXIPIME) IV    ? lactated ringers    ? ? ?Assessment: ?Patient is reporting with shortness of breath and  Pharmacy consulted to dose warfarin. Last visit to [facility] measured INR of 2.8. Patient has received azithromycin and cefepime, which can increase bleeding risk. INR today is 2.3 on admission. Last dose of warfarin was 3/10 at 0800.  CBC stable. ? ?PTA regimen: warfarin 5 mg tablets - 1.5 tablets daily accept accept 2 mg on Monday and Friday ? ?Goal of Therapy:  ?INR 2-3 per coag note ?Monitor platelets by anticoagulation protocol: Yes ?  ?Plan:  ?Continue home regimen with 10 mg x 1 tomorrow morning on schedule ?Daily INR  ? ?Thank you for allowing pharmacy to participate in this patient's care. ? ?Reatha Harps, PharmD ?PGY1 Pharmacy Resident ?08/11/2021 2:44 PM ?Check [email or web address] for unit specific pharmacy number ? ? ? ?

## 2021-08-11 NOTE — ED Notes (Signed)
[facility] called to transport pt to hospital.  ?

## 2021-08-11 NOTE — Assessment & Plan Note (Signed)
Continue Zoloft 

## 2021-08-11 NOTE — ED Triage Notes (Signed)
Pt BIB [facility] from [facility] d/t SOB that started 2 weeks ago. Hx of COPD, home albuterol has not helped, dyspnea is worse with exertion, LS decreased all around. spO2 on RA was 85% & 79% with ambulation. EMS gave 5 mg Albuterol, 2 duo neds Atrovent, 125 solumedrol, 2g of Magnesium while en route to ED. Upon arrival to ED EMS reports pt improved & is sating 97% on 2LO2 via n/c. A/Ox4. ?

## 2021-08-12 DIAGNOSIS — Z833 Family history of diabetes mellitus: Secondary | ICD-10-CM | POA: Diagnosis not present

## 2021-08-12 DIAGNOSIS — Z885 Allergy status to narcotic agent status: Secondary | ICD-10-CM | POA: Diagnosis not present

## 2021-08-12 DIAGNOSIS — J441 Chronic obstructive pulmonary disease with (acute) exacerbation: Secondary | ICD-10-CM | POA: Diagnosis not present

## 2021-08-12 DIAGNOSIS — Z803 Family history of malignant neoplasm of breast: Secondary | ICD-10-CM | POA: Diagnosis not present

## 2021-08-12 DIAGNOSIS — Z8249 Family history of ischemic heart disease and other diseases of the circulatory system: Secondary | ICD-10-CM | POA: Diagnosis not present

## 2021-08-12 DIAGNOSIS — Z807 Family history of other malignant neoplasms of lymphoid, hematopoietic and related tissues: Secondary | ICD-10-CM | POA: Diagnosis not present

## 2021-08-12 DIAGNOSIS — J440 Chronic obstructive pulmonary disease with acute lower respiratory infection: Secondary | ICD-10-CM | POA: Diagnosis present

## 2021-08-12 DIAGNOSIS — J9601 Acute respiratory failure with hypoxia: Secondary | ICD-10-CM | POA: Diagnosis present

## 2021-08-12 DIAGNOSIS — F419 Anxiety disorder, unspecified: Secondary | ICD-10-CM | POA: Diagnosis present

## 2021-08-12 DIAGNOSIS — Z800 Family history of malignant neoplasm of digestive organs: Secondary | ICD-10-CM | POA: Diagnosis not present

## 2021-08-12 DIAGNOSIS — Z853 Personal history of malignant neoplasm of breast: Secondary | ICD-10-CM | POA: Diagnosis not present

## 2021-08-12 DIAGNOSIS — R5381 Other malaise: Secondary | ICD-10-CM

## 2021-08-12 DIAGNOSIS — J189 Pneumonia, unspecified organism: Secondary | ICD-10-CM | POA: Diagnosis present

## 2021-08-12 DIAGNOSIS — Z808 Family history of malignant neoplasm of other organs or systems: Secondary | ICD-10-CM | POA: Diagnosis not present

## 2021-08-12 DIAGNOSIS — Z87891 Personal history of nicotine dependence: Secondary | ICD-10-CM | POA: Diagnosis not present

## 2021-08-12 DIAGNOSIS — I10 Essential (primary) hypertension: Secondary | ICD-10-CM | POA: Diagnosis present

## 2021-08-12 DIAGNOSIS — M1990 Unspecified osteoarthritis, unspecified site: Secondary | ICD-10-CM | POA: Diagnosis present

## 2021-08-12 DIAGNOSIS — I480 Paroxysmal atrial fibrillation: Secondary | ICD-10-CM | POA: Diagnosis present

## 2021-08-12 DIAGNOSIS — Z886 Allergy status to analgesic agent status: Secondary | ICD-10-CM | POA: Diagnosis not present

## 2021-08-12 DIAGNOSIS — Z20822 Contact with and (suspected) exposure to covid-19: Secondary | ICD-10-CM | POA: Diagnosis present

## 2021-08-12 DIAGNOSIS — Z66 Do not resuscitate: Secondary | ICD-10-CM | POA: Diagnosis present

## 2021-08-12 DIAGNOSIS — Z8719 Personal history of other diseases of the digestive system: Secondary | ICD-10-CM | POA: Diagnosis not present

## 2021-08-12 DIAGNOSIS — Z806 Family history of leukemia: Secondary | ICD-10-CM | POA: Diagnosis not present

## 2021-08-12 DIAGNOSIS — R54 Age-related physical debility: Secondary | ICD-10-CM | POA: Diagnosis present

## 2021-08-12 DIAGNOSIS — Z801 Family history of malignant neoplasm of trachea, bronchus and lung: Secondary | ICD-10-CM | POA: Diagnosis not present

## 2021-08-12 LAB — BASIC METABOLIC PANEL
Anion gap: 7 (ref 5–15)
BUN: 15 mg/dL (ref 8–23)
CO2: 26 mmol/L (ref 22–32)
Calcium: 9.1 mg/dL (ref 8.9–10.3)
Chloride: 108 mmol/L (ref 98–111)
Creatinine, Ser: 0.7 mg/dL (ref 0.44–1.00)
GFR, Estimated: >60 mL/min (ref >60)
Glucose, Bld: 157 mg/dL — ABNORMAL HIGH (ref 70–99)
Potassium: 4.1 mmol/L (ref 3.5–5.1)
Sodium: 141 mmol/L (ref 135–145)

## 2021-08-12 LAB — CBC
HCT: 39.8 % (ref 36.0–46.0)
Hemoglobin: 13.1 g/dL (ref 12.0–15.0)
MCH: 30.3 pg (ref 26.0–34.0)
MCHC: 32.9 g/dL (ref 30.0–36.0)
MCV: 92.1 fL (ref 80.0–100.0)
Platelets: 183 10*3/uL (ref 150–400)
RBC: 4.32 MIL/uL (ref 3.87–5.11)
RDW: 13.5 % (ref 11.5–15.5)
WBC: 22 10*3/uL — ABNORMAL HIGH (ref 4.0–10.5)
nRBC: 0 % (ref 0.0–0.2)

## 2021-08-12 MED ORDER — SODIUM CHLORIDE 0.9 % IV SOLN
1.0000 g | INTRAVENOUS | Status: DC
  Administered 2021-08-12: 1 g via INTRAVENOUS
  Filled 2021-08-12: qty 10

## 2021-08-12 MED ORDER — SODIUM CHLORIDE 0.9 % IV SOLN
2.0000 g | INTRAVENOUS | Status: DC
  Administered 2021-08-13 – 2021-08-15 (×3): 2 g via INTRAVENOUS
  Filled 2021-08-12 (×3): qty 20

## 2021-08-12 MED ORDER — SODIUM CHLORIDE 0.9 % IV SOLN
500.0000 mg | INTRAVENOUS | Status: DC
  Administered 2021-08-12 – 2021-08-15 (×4): 500 mg via INTRAVENOUS
  Filled 2021-08-12 (×4): qty 5

## 2021-08-12 NOTE — Assessment & Plan Note (Signed)
PT/OT recommending home health on discharge ?

## 2021-08-12 NOTE — Evaluation (Signed)
Occupational Therapy Evaluation ?Patient Details ?Name: Teresa Frey ?MRN: [id number] ?DOB: [birth year] ?Today's Date: 08/12/2021 ? ? ?History of Present Illness 88 y.o. female with medical history significant of remote breast cancer, afib on Coumadin, and HTN presenting with SOB. Admitted for COPD exacerbation and post-viral pneumonia  ? ?Clinical Impression ?  ?Pt reports living at home alone, dtr next door. Generally her baseline is Indep with ADLs and uses SPC for mobility. No recent falls to report. Currently pt presents with limitation in activity tolerance, persistent SOB with activity, balance and strength deficits leading to need for increased A for functional transfers as well as seated basic ADLs. Anticipate significantly increased A required for IADLs at this time as pt was limited by fatigue. Pt was able to maintain O2 levels of 93% on 2L while sitting/standing and participating in ADLs with cues for pursed lip breathing. Anticipate pt to improve acutely with education and continued participation with skilled OT, as well as OOB activity with Staff. OT will continue to follow acutely, with recommendation at this time for d/c with [facility] pending progress. Will assess for changes at next session.  ?   ? ?Recommendations for follow up therapy are one component of a multi-disciplinary discharge planning process, led by the attending physician.  Recommendations may be updated based on patient status, additional functional criteria and insurance authorization.  ? ?Follow Up Recommendations ? Home health OT  ?  ?Assistance Recommended at Discharge PRN  ?Patient can return home with the following Assist for transportation;A little help with bathing/dressing/bathroom;A little help with walking and/or transfers ? ?  ?Functional Status Assessment ? Patient has had a recent decline in their functional status and demonstrates the ability to make significant improvements in function in a reasonable and predictable amount of  time.  ?Equipment Recommendations ? Other (comment) (pt with all appropriate DME at home)  ?  ?Recommendations for Other Services   ? ? ?  ?Precautions / Restrictions Precautions ?Precautions: None ?Restrictions ?Weight Bearing Restrictions: No  ? ?  ? ?Mobility Bed Mobility ?Overal bed mobility: Needs Assistance ?Bed Mobility: Supine to Sit, Sit to Supine ?  ?  ?Supine to sit: Supervision ?Sit to supine: Supervision ?  ?General bed mobility comments: Supervision for safety,cues for technique, requires extra time. ?  ? ?Transfers ?Overall transfer level: Needs assistance ?Equipment used: Straight cane ?Transfers: Sit to/from Stand ?Sit to Stand: Min guard ?  ?  ?  ?  ?  ?General transfer comment: cues for hand placement and technique for transition, requiring minguard to min to achieve full transition on 1st trial, min guard second trial. noted fatigue ?  ? ?  ?Balance Overall balance assessment: Needs assistance ?Sitting-balance support: No upper extremity supported, Feet supported ?  ?  ?  ?  ?  ?  ?  ?  ?  ?  ?  ?  ?  ?  ?  ?  ?  ?   ? ?ADL either performed or assessed with clinical judgement  ? ?ADL Overall ADL's : Needs assistance/impaired ?Eating/Feeding: Independent ?  ?Grooming: Set up ?  ?Upper Body Bathing: Set up;Minimal assistance;Sitting ?  ?Lower Body Bathing: Minimal assistance;Sit to/from stand ?  ?  ?  ?Lower Body Dressing: Minimal assistance;Sit to/from stand ?  ?Toilet Transfer: Minimal assistance;Rolling walker (2 wheels);Cueing for safety ?  ?Toileting- Architect and Hygiene: Min guard;Sit to/from stand ?  ?  ?  ?  ?General ADL Comments: pt requiring up to min A  for safety today d/t persistent reports and presence of SOB despite O2 levels on 2L O2 sustaining >94% throughout session .  ? ? ? ?Vision Baseline Vision/History: 1 Wears glasses ?Vision Assessment?: No apparent visual deficits  ?   ?Perception   ?  ?Praxis   ?  ? ?Pertinent Vitals/Pain Pain Assessment ?Pain Assessment:  No/denies pain  ? ? ? ?Hand Dominance Right ?  ?Extremity/Trunk Assessment Upper Extremity Assessment ?Upper Extremity Assessment: Generalized weakness ?  ?Lower Extremity Assessment ?Lower Extremity Assessment: Defer to PT evaluation ?  ?Cervical / Trunk Assessment ?Cervical / Trunk Assessment: Normal ?  ?Communication Communication ?Communication: No difficulties ?  ?Cognition Arousal/Alertness: Awake/alert ?Behavior During Therapy: [facility] for tasks assessed/performed ?Overall Cognitive Status: Within Functional Limits for tasks assessed ?  ?  ?  ?  ?  ?  ?  ?  ?  ?  ?  ?  ?  ?  ?  ?  ?  ?  ?  ?General Comments  2L via [place] on thorughout session with O2 sustaining >=94% ? ?  ?Exercises   ?  ?Shoulder Instructions    ? ? ?Home Living Family/patient expects to be discharged to:: Private residence ?Living Arrangements: Alone ?Available Help at Discharge: Family;Available PRN/intermittently ?Type of Home: House ?Home Access: Stairs to enter ?Entrance Stairs-Number of Steps: 2 steps and a rail ?Entrance Stairs-Rails: Right ?Home Layout: Two level;Able to live on main level with bedroom/bathroom;Other (Comment) (rents basement apartment) ?  ?  ?Bathroom Shower/Tub: Walk-in shower ?  ?  ?  ?  ?Home Equipment: Shower seat;BSC/3in1;Cane - single point;Wheelchair Financial trader (4 wheels) ?  ?Additional Comments: dtr lives next door ?  ? ?  ?Prior Functioning/Environment Prior Level of Function : Independent/Modified Independent ?  ?  ?  ?  ?  ?  ?Mobility Comments: uses SPC for mobility ?ADLs Comments: Indep basic ADLs, endorses generally sedentary at home, cares for dog ?  ? ?  ?  ?OT Problem List:   ?  ?   ?OT Treatment/Interventions: Self-care/ADL training;Therapeutic exercise;DME and/or AE instruction;Energy conservation;Therapeutic activities;Balance training;Patient/family education  ?  ?OT Goals(Current goals can be found in the care plan section) Acute Rehab OT Goals ?Patient Stated Goal: to be able to do more before  I get tired ?OT Goal Formulation: With patient ?Time For Goal Achievement: 08/26/21 ?Potential to Achieve Goals: Good ?ADL Goals ?Pt Will Perform Upper Body Bathing: with modified independence;with adaptive equipment;sitting ?Pt Will Perform Lower Body Bathing: with modified independence;with adaptive equipment ?Pt Will Perform Lower Body Dressing: with modified independence;with adaptive equipment;sit to/from stand ?Pt Will Transfer to Toilet: with modified independence;ambulating ?Pt Will Perform Tub/Shower Transfer: with modified independence ?Additional ADL Goal #1: pt will be able to indep verbalize 3 Energy conservation techniques for completion of ADLs for decreased presence of SOB  ?OT Frequency: Min 2X/week ?  ? ?Co-evaluation   ?  ?  ?  ?  ? ?  ?AM-PAC OT "6 Clicks" Daily Activity     ?Outcome Measure Help from another person eating meals?: None ?Help from another person taking care of personal grooming?: None ?Help from another person toileting, which includes using toliet, bedpan, or urinal?: A Little ?Help from another person bathing (including washing, rinsing, drying)?: A Lot ?Help from another person to put on and taking off regular upper body clothing?: A Little ?Help from another person to put on and taking off regular lower body clothing?: A Little ?6 Click Score: 19 ?  ?End  of Session   ? ?Activity Tolerance: Patient limited by fatigue ?Patient left: in bed;with bed alarm set;with call bell/phone within reach ? ?OT Visit Diagnosis: Unsteadiness on feet (R26.81);Muscle weakness (generalized) (M62.81)  ?              ?Time: 8119-1478 ?OT Time Calculation (min): 32 min ?Charges:  OT General Charges ?$OT Visit: 1 Visit ?OT Evaluation ?$OT Eval Low Complexity: 1 Low ?OT Treatments ?$Self Care/Home Management : 8-22 mins ? ?Temekia Caskey OTR/L ?acute rehab services ?Office: [phone] ? ? ?Ally L Isidra Mings ?08/12/2021, 9:46 AM ?

## 2021-08-12 NOTE — Progress Notes (Signed)
PROGRESS NOTE  Teresa Frey  VEH:[id number] DOB: 10/21/1929 DOA: 08/11/2021 PCP: [name], [name], MD   Brief Narrative:  Patient is a 88 year old female with history of remote breast cancer, paroxysmal A-fib on Coumadin, hypertension who presented with shortness of breath.  She reported 2 weeks history of getting cold, coughing, bringing up phlegm.  She reported fever at home.  At urgent care, she was hypoxic with saturation in the range of 70s on room air.  She was found to be febrile, had leukocytosis.  Chest x-ray showed possible bronchitis.  Patient was admitted for the management of COPD exacerbation with concurrent possible pneumonia.   Assessment & Plan:  Principal Problem:   COPD with acute exacerbation ([place]) Active Problems:   Anxiety disorder, unspecified   Essential hypertension   A-fib (HCC)   DNR (do not resuscitate)   Physical debility   Assessment and Plan: * COPD with acute exacerbation ([place]) -With possible concurrent pneumonia -Patient's shortness of breath and productive cough are most likely caused by acute COPD exacerbation.  -She has history of COPD but has never required O2; she was in the 70s at [facility] and 89% here  -She does not have fever here but did at home PTA; she has significant leukocytosis.  -Chest x-ray suspicious for pneumonia -Nebulizers: scheduled Duoneb and prn albuterol -Solu-Medrol 60 mg IV BID -> Prednisone 40 mg PO daily -Continue ceftriaxone azithromycin -We will try to wean the oxygen -Continue mucolytic's  Physical debility PT/OT recommending home health on discharge  DNR (do not resuscitate) Code status is DNR  A-fib (HCC) -Paroxysmal A-fib.  Currently in normal sinus rhythm -Rate controlled with metoprolol -Continue Coumadin, pharmacy to dose  Essential hypertension -Continue Lopressor, HCTZ -Added prn IV hydralazine  Anxiety disorder, unspecified -Continue Zoloft              DVT prophylaxis:Coumadin     Code  Status: DNR  Family Communication: Daughter on phone on 3/11  Patient status:Obs  Patient is from :Home  Anticipated discharge EZ:[facility]  Estimated DC date:in 1-2 days   Consultants: None   Procedures:None  Antimicrobials:  Anti-infectives (From admission, onward)    Start     Dose/Rate Route Frequency Ordered Stop   08/13/21 0900  cefTRIAXone (ROCEPHIN) 2 g in sodium chloride 0.9 % 100 mL IVPB        2 g 200 mL/hr over 30 Minutes Intravenous Every 24 hours 08/12/21 1146     08/12/21 1245  azithromycin (ZITHROMAX) 500 mg in sodium chloride 0.9 % 250 mL IVPB        500 mg 250 mL/hr over 60 Minutes Intravenous Every 24 hours 08/12/21 1146     08/12/21 0900  cefTRIAXone (ROCEPHIN) 1 g in sodium chloride 0.9 % 100 mL IVPB  Status:  Discontinued        1 g 200 mL/hr over 30 Minutes Intravenous Every 24 hours 08/12/21 0813 08/12/21 1146   08/11/21 1500  ceFEPIme (MAXIPIME) 2 g in sodium chloride 0.9 % 100 mL IVPB  Status:  Discontinued        2 g 200 mL/hr over 30 Minutes Intravenous Every 12 hours 08/11/21 1415 08/12/21 0813   08/11/21 1300  cefTRIAXone (ROCEPHIN) 1 g in sodium chloride 0.9 % 100 mL IVPB        1 g 200 mL/hr over 30 Minutes Intravenous  Once 08/11/21 1250 08/11/21 1422   08/11/21 1300  azithromycin (ZITHROMAX) 500 mg in sodium chloride 0.9 % 250  mL IVPB        500 mg 250 mL/hr over 60 Minutes Intravenous  Once 08/11/21 1250 08/11/21 1540       Subjective:  Patient seen and examined at the bedside this morning.  Hemodynamically stable.  On 2 to 3 L of oxygen per minute.  She was about to work with occupational therapy.  Continues to cough but denies any worsening shortness of breath.  Objective: Vitals:   08/12/21 0403 08/12/21 0746 08/12/21 0756 08/12/21 1011  BP: 132/61  (!) 116/48 (!) 135/57  Pulse: 84  78 91  Resp: '17  16 18  '$ Temp: 97.8 F (36.6 C)  98.1 F (36.7 C) 98.1 F (36.7 C)  TempSrc: Oral  Oral Oral  SpO2: 94% 95% 93% 95%     Intake/Output Summary (Last 24 hours) at 08/12/2021 1338 Last data filed at 08/12/2021 0300 Gross per 24 hour  Intake 980.02 ml  Output --  Net 980.02 ml   There were no vitals filed for this visit.  Examination:  General exam: Deconditioned elderly female HEENT: PERRL Respiratory system: Bilateral coarse breath sounds, rhonchi, no wheezing Cardiovascular system: S1 & S2 heard, RRR.  Gastrointestinal system: Abdomen is nondistended, soft and nontender. Central nervous system: Alert and oriented Extremities: No edema, no clubbing ,no cyanosis Skin: No rashes, no ulcers,no icterus     Data Reviewed: I have personally reviewed following labs and imaging studies  CBC: Recent Labs  Lab 08/11/21 1220 08/12/21 0241  WBC 23.5* 22.0*  NEUTROABS 19.3*  --   HGB 15.1* 13.1  HCT 45.8 39.8  MCV 92.5 92.1  PLT 206 882   Basic Metabolic Panel: Recent Labs  Lab 08/11/21 1220 08/12/21 0241  NA 139 141  K 3.6 4.1  CL 103 108  CO2 25 26  GLUCOSE 156* 157*  BUN 13 15  CREATININE 0.76 0.70  CALCIUM 9.3 9.1     Recent Results (from the past 240 hour(s))  Resp Panel by RT-PCR (Flu A&B, Covid) Nasopharyngeal Swab     Status: None   Collection Time: 08/11/21 12:08 PM   Specimen: Nasopharyngeal Swab; Nasopharyngeal(NP) swabs in vial transport medium  Result Value Ref Range Status   SARS Coronavirus 2 by RT PCR NEGATIVE NEGATIVE Final    Comment: (NOTE) SARS-CoV-2 target nucleic acids are NOT DETECTED.  The SARS-CoV-2 RNA is generally detectable in upper respiratory specimens during the acute phase of infection. The lowest concentration of SARS-CoV-2 viral copies this assay can detect is 138 copies/mL. A negative result does not preclude SARS-Cov-2 infection and should not be used as the sole basis for treatment or other patient management decisions. A negative result may occur with  improper specimen collection/handling, submission of specimen other than nasopharyngeal  swab, presence of viral mutation(s) within the areas targeted by this assay, and inadequate number of viral copies(<138 copies/mL). A negative result must be combined with clinical observations, patient history, and epidemiological information. The expected result is Negative.  Fact Sheet for Patients:  EntrepreneurPulse.com.au  Fact Sheet for Healthcare Providers:  IncredibleEmployment.be  This test is no t yet approved or cleared by the [place] FDA and  has been authorized for detection and/or diagnosis of SARS-CoV-2 by FDA under an Emergency Use Authorization (EUA). This EUA will remain  in effect (meaning this test can be used) for the duration of the COVID-19 declaration under Section 564(b)(1) of the Act, 21 U.S.C.section 360bbb-3(b)(1), unless the authorization is terminated  or revoked sooner.  Influenza A by PCR NEGATIVE NEGATIVE Final   Influenza B by PCR NEGATIVE NEGATIVE Final    Comment: (NOTE) The Xpert Xpress SARS-CoV-2/FLU/RSV plus assay is intended as an aid in the diagnosis of influenza from Nasopharyngeal swab specimens and should not be used as a sole basis for treatment. Nasal washings and aspirates are unacceptable for Xpert Xpress SARS-CoV-2/FLU/RSV testing.  Fact Sheet for Patients: EntrepreneurPulse.com.au  Fact Sheet for Healthcare Providers: IncredibleEmployment.be  This test is not yet approved or cleared by the [place] FDA and has been authorized for detection and/or diagnosis of SARS-CoV-2 by FDA under an Emergency Use Authorization (EUA). This EUA will remain in effect (meaning this test can be used) for the duration of the COVID-19 declaration under Section 564(b)(1) of the Act, 21 U.S.C. section 360bbb-3(b)(1), unless the authorization is terminated or revoked.  Performed at [facility], [place] [address], [place], [place] [address]   Blood  culture (routine x 2)     Status: None (Preliminary result)   Collection Time: 08/11/21  1:55 PM   Specimen: BLOOD  Result Value Ref Range Status   Specimen Description BLOOD SITE NOT SPECIFIED  Final   Special Requests   Final    BOTTLES DRAWN AEROBIC AND ANAEROBIC Blood Culture results may not be optimal due to an excessive volume of blood received in culture bottles   Culture   Final    NO GROWTH < 24 HOURS Performed at [facility], [place] [address] [place], [place] [address]    Report Status PENDING  Incomplete  Blood culture (routine x 2)     Status: None (Preliminary result)   Collection Time: 08/11/21  1:57 PM   Specimen: BLOOD  Result Value Ref Range Status   Specimen Description BLOOD SITE NOT SPECIFIED  Final   Special Requests   Final    BOTTLES DRAWN AEROBIC AND ANAEROBIC Blood Culture adequate volume   Culture   Final    NO GROWTH < 24 HOURS Performed at [facility], [place] [address] [place], [place] [address]    Report Status PENDING  Incomplete     Radiology Studies: DG Chest Portable 1 View  Result Date: 08/11/2021 CLINICAL DATA:  Shortness of breath, cough for 2 weeks EXAM: PORTABLE CHEST 1 VIEW COMPARISON:  06/17/2018 FINDINGS: Bilateral mild interstitial thickening. Left basilar atelectasis. No pleural effusion or pneumothorax. Heart and mediastinal contours are unremarkable. No acute osseous abnormality. IMPRESSION: 1. Bilateral interstitial thickening which may reflect mild interstitial edema versus infection. Electronically Signed   By: Kathreen Devoid M.D.   On: 08/11/2021 12:25    Scheduled Meds:  docusate sodium  100 mg Oral BID   hydrochlorothiazide  12.5 mg Oral Daily   metoprolol tartrate  25 mg Oral Daily   predniSONE  40 mg Oral Q breakfast   sertraline  50 mg Oral Daily   Warfarin - Pharmacist Dosing Inpatient   Does not apply q1600   Continuous Infusions:  azithromycin (ZITHROMAX) 500 MG IVPB (Vial-Mate [employer]) 500 mg (08/12/21 1240)    [START ON 08/13/2021] cefTRIAXone (ROCEPHIN)  IV       LOS: 0 days   Shelly Coss, MD [facility] P3/04/2022, 1:38 PM

## 2021-08-12 NOTE — Evaluation (Signed)
Physical Therapy Evaluation ?Patient Details ?Name: Teresa Frey ?MRN: [id number] ?DOB: [birth year] ?Today's Date: 08/12/2021 ? ?History of Present Illness ? 88 y.o. female with medical history significant of remote breast cancer, afib on Coumadin, and HTN presenting with SOB. Admitted for COPD exacerbation and post-viral pneumonia  ?Clinical Impression ? Pt admitted with above diagnosis. Demonstrates ability to ambulate with RW 125 feet on 2L supplemental O2, sats at 92% moderate DOE. Previously very active, using SPC, no supplemental O2. 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 Retired last year at 88 Y.O. Likely to progress well, may need HHPT pending progress.  Pt currently with functional limitations due to the deficits listed below (see PT Problem List). Pt will benefit from skilled PT to increase their independence and safety with mobility to allow discharge to the venue listed below.   ?   ?   ? ?Recommendations for follow up therapy are one component of a multi-disciplinary discharge planning process, led by the attending physician.  Recommendations may be updated based on patient status, additional functional criteria and insurance authorization. ? ?Follow Up Recommendations Home health PT (Pending progress) ? ?  ?Assistance Recommended at Discharge PRN  ?Patient can return home with the following ? Assist for transportation;Help with stairs or ramp for entrance ? ?  ?Equipment Recommendations None recommended by PT  ?Recommendations for Other Services ?    ?  ?Functional Status Assessment Patient has had a recent decline in their functional status and demonstrates the ability to make significant improvements in function in a reasonable and predictable amount of time.  ? ?  ?Precautions / Restrictions Precautions ?Precautions: None ?Restrictions ?Weight Bearing Restrictions: No  ? ?  ? ?Mobility ? Bed Mobility ?Overal bed mobility: Needs Assistance ?Bed Mobility: Supine to Sit, Sit to Supine ?  ?  ?Supine  to sit: Supervision ?Sit to supine: Supervision ?  ?General bed mobility comments: Supervision for safety,cues for technique, requires extra time. ?  ? ?Transfers ?Overall transfer level: Needs assistance ?Equipment used: Rolling walker (2 wheels) ?Transfers: Sit to/from Stand ?Sit to Stand: Min guard ?  ?  ?  ?  ?  ?General transfer comment: Min guard for safety. Effortful to rise (reportedly due to hx of Lt hip injury). Cues for technique and hand placement. ?  ? ?Ambulation/Gait ?Ambulation/Gait assistance: Min guard ?Gait Distance (Feet): 125 Feet ?Assistive device: Rollator (4 wheels) ?Gait Pattern/deviations: Step-through pattern, Decreased stride length ?Gait velocity: decreased ?  ?  ?General Gait Details: Min guard for safety. No overt LOB noted with RW. Pt agreeable to this device over ther SPC due to dyspnea and weakness. SpO2 92% on 2L supplemental O2. Educated on AD use and safety awareness. Required one brief standing rest break at mid-way point. ? ?Stairs ?  ?  ?  ?  ?  ? ?Wheelchair Mobility ?  ? ?Modified Rankin (Stroke Patients Only) ?  ? ?  ? ?Balance Overall balance assessment: Needs assistance ?Sitting-balance support: No upper extremity supported, Feet supported ?Sitting balance-Leahy Scale: Good ?  ?  ?Standing balance support: No upper extremity supported ?Standing balance-Leahy Scale: Fair ?  ?  ?  ?  ?  ?  ?  ?  ?  ?  ?  ?  ?   ? ? ? ?Pertinent Vitals/Pain Pain Assessment ?Pain Assessment: No/denies pain  ? ? ?Home Living Family/patient expects to be discharged to:: Private residence ?Living Arrangements: Alone ?Available Help at Discharge: Family;Available PRN/intermittently ?Type of Home: House ?Home Access:  Stairs to enter ?Entrance Stairs-Rails: Right ?Entrance Stairs-Number of Steps: 2 steps and a rail ?  ?Home Layout: Two level;Able to live on main level with bedroom/bathroom;Other (Comment) (rents basement apartment) ?Home Equipment: Shower seat;BSC/3in1;Cane - single  point;Wheelchair Probation officer (4 wheels) ?   ?  ?Prior Function Prior Level of Function : Independent/Modified Independent (Retired recently) ?  ?  ?  ?  ?  ?  ?  ?  ?  ? ? ?Hand Dominance  ? Dominant Hand: Right ? ?  ?Extremity/Trunk Assessment  ? Upper Extremity Assessment ?Upper Extremity Assessment: Defer to OT evaluation ?  ? ?Lower Extremity Assessment ?Lower Extremity Assessment: Generalized weakness ?  ? ?   ?Communication  ? Communication: No difficulties  ?Cognition Arousal/Alertness: Awake/alert ?Behavior During Therapy: [facility] for tasks assessed/performed ?Overall Cognitive Status: Within Functional Limits for tasks assessed ?  ?  ?  ?  ?  ?  ?  ?  ?  ?  ?  ?  ?  ?  ?  ?  ?  ?  ?  ? ?  ?General Comments General comments (skin integrity, edema, etc.): Pre activity SpO2 98% on 3L supplemental O2 HR 91 -> during activity SpO2 92% on 2L HR 116 -> Post activity SpO2 97% on 2L HR 102. ? ?  ?Exercises    ? ?Assessment/Plan  ?  ?PT Assessment Patient needs continued PT services  ?PT Problem List Decreased strength;Decreased activity tolerance;Decreased balance;Decreased mobility;Cardiopulmonary status limiting activity;Decreased knowledge of use of DME ? ?   ?  ?PT Treatment Interventions DME instruction;Gait training;Stair training;Functional mobility training;Therapeutic activities;Therapeutic exercise;Balance training;Neuromuscular re-education;Patient/family education   ? ?PT Goals (Current goals can be found in the Care Plan section)  ?Acute Rehab PT Goals ?Patient Stated Goal: Get well ?PT Goal Formulation: With patient ?Time For Goal Achievement: 08/26/21 ?Potential to Achieve Goals: Good ? ?  ?Frequency Min 3X/week ?  ? ? ?Co-evaluation   ?  ?  ?  ?  ? ? ?  ?AM-PAC PT "6 Clicks" Mobility  ?Outcome Measure Help needed turning from your back to your side while in a flat bed without using bedrails?: None ?Help needed moving from lying on your back to sitting on the side of a flat bed without using  bedrails?: None ?Help needed moving to and from a bed to a chair (including a wheelchair)?: A Little ?Help needed standing up from a chair using your arms (e.g., wheelchair or bedside chair)?: A Little ?Help needed to walk in hospital room?: A Little ?Help needed climbing 3-5 steps with a railing? : A Lot ?6 Click Score: 19 ? ?  ?End of Session Equipment Utilized During Treatment: Gait belt;Oxygen ?Activity Tolerance: Patient tolerated treatment well ?Patient left: in bed;in CPM;with bed alarm set ?  ?PT Visit Diagnosis: Unsteadiness on feet (R26.81);Muscle weakness (generalized) (M62.81);Difficulty in walking, not elsewhere classified (R26.2) ?  ? ?Time: 9774-1423 ?PT Time Calculation (min) (ACUTE ONLY): 28 min ? ? ?Charges:   PT Evaluation ?$PT Eval Low Complexity: 1 Low ?PT Treatments ?$Gait Training: 8-22 mins ?  ?   ? ? ?Elayne Snare, PT, DPT ? ?Ellouise Newer ?08/12/2021, 11:48 AM ? ?

## 2021-08-13 DIAGNOSIS — D72829 Elevated white blood cell count, unspecified: Secondary | ICD-10-CM

## 2021-08-13 LAB — CBC WITH DIFFERENTIAL/PLATELET
Abs Immature Granulocytes: 0.26 10*3/uL — ABNORMAL HIGH (ref 0.00–0.07)
Basophils Absolute: 0 10*3/uL (ref 0.0–0.1)
Basophils Relative: 0 %
Eosinophils Absolute: 0 10*3/uL (ref 0.0–0.5)
Eosinophils Relative: 0 %
HCT: 39 % (ref 36.0–46.0)
Hemoglobin: 12.6 g/dL (ref 12.0–15.0)
Immature Granulocytes: 1 %
Lymphocytes Relative: 6 %
Lymphs Abs: 1.4 10*3/uL (ref 0.7–4.0)
MCH: 30 pg (ref 26.0–34.0)
MCHC: 32.3 g/dL (ref 30.0–36.0)
MCV: 92.9 fL (ref 80.0–100.0)
Monocytes Absolute: 1.1 10*3/uL — ABNORMAL HIGH (ref 0.1–1.0)
Monocytes Relative: 5 %
Neutro Abs: 21.5 10*3/uL — ABNORMAL HIGH (ref 1.7–7.7)
Neutrophils Relative %: 88 %
Platelets: 205 10*3/uL (ref 150–400)
RBC: 4.2 MIL/uL (ref 3.87–5.11)
RDW: 13.8 % (ref 11.5–15.5)
WBC: 24.3 10*3/uL — ABNORMAL HIGH (ref 4.0–10.5)
nRBC: 0 % (ref 0.0–0.2)

## 2021-08-13 LAB — BASIC METABOLIC PANEL
Anion gap: 7 (ref 5–15)
BUN: 22 mg/dL (ref 8–23)
CO2: 26 mmol/L (ref 22–32)
Calcium: 8.8 mg/dL — ABNORMAL LOW (ref 8.9–10.3)
Chloride: 108 mmol/L (ref 98–111)
Creatinine, Ser: 0.65 mg/dL (ref 0.44–1.00)
GFR, Estimated: >60 mL/min (ref >60)
Glucose, Bld: 140 mg/dL — ABNORMAL HIGH (ref 70–99)
Potassium: 4.4 mmol/L (ref 3.5–5.1)
Sodium: 141 mmol/L (ref 135–145)

## 2021-08-13 LAB — PROTIME-INR
INR: 3.4 — ABNORMAL HIGH (ref 0.8–1.2)
Prothrombin Time: 34.4 seconds — ABNORMAL HIGH (ref 11.4–15.2)

## 2021-08-13 MED ORDER — WARFARIN SODIUM 5 MG PO TABS
5.0000 mg | ORAL_TABLET | Freq: Once | ORAL | Status: AC
  Administered 2021-08-13: 5 mg via ORAL
  Filled 2021-08-13: qty 1

## 2021-08-13 MED ORDER — ENSURE ENLIVE PO LIQD
237.0000 mL | Freq: Two times a day (BID) | ORAL | Status: DC
  Administered 2021-08-14 – 2021-08-15 (×3): 237 mL via ORAL

## 2021-08-13 MED ORDER — METOPROLOL TARTRATE 25 MG PO TABS
25.0000 mg | ORAL_TABLET | Freq: Two times a day (BID) | ORAL | Status: DC
  Administered 2021-08-13 – 2021-08-15 (×5): 25 mg via ORAL
  Filled 2021-08-13 (×5): qty 1

## 2021-08-13 NOTE — Progress Notes (Signed)
Initial Nutrition Assessment ? ?DOCUMENTATION CODES:  ?Not applicable ? ?INTERVENTION:  ?Continue current diet as ordered, encourage PO intake ?Ensure Enlive po BID, each supplement provides 350 kcal and 20 grams of protein. ?Request measured weight ? ?NUTRITION DIAGNOSIS:  ?Inadequate oral intake related to decreased appetite as evidenced by per patient/family report. ? ?GOAL:  ?Patient will meet greater than or equal to 90% of their needs ? ?MONITOR:  ?PO intake, Supplement acceptance ? ?REASON FOR ASSESSMENT:  ?Consult ? (nutritional goals) ? ?ASSESSMENT:  ?88 y.o. female with history of COPD, HLD, hx breast cancer, afib, diverticulosis, and HTN presented to ED with SOB and fever related to a cold that has been present for several weeks. Found to have COPD exacerbation.  ? ?Pt with improvement overnight, likely dc tomorrow.  ? ?No intake or weight recorded this admission. Will request new measured weight and add supplements to augment oral intake. ? ?Nutritionally Relevant Medications: ?Scheduled Meds: ? docusate sodium  100 mg Oral BID  ? hydrochlorothiazide  12.5 mg Oral Daily  ? predniSONE  40 mg Oral Q breakfast  ? Warfarin    Does not apply q1600  ? ?Continuous Infusions: ? azithromycin (ZITHROMAX) 500 MG  500 mg (08/13/21 1303)  ? cefTRIAXone (ROCEPHIN)  IV 2 g (08/13/21 1134)  ? ?PRN Meds: bisacodyl,  ondansetron, polyethylene glycol ? ?Labs Reviewed ? ?NUTRITION - FOCUSED PHYSICAL EXAM: ?Defer to in-person assessment ? ?Diet Order:   ?Diet Order   ? ?       ?  Diet regular Room service appropriate? Yes; Fluid consistency: Thin  Diet effective now       ?  ? ?  ?  ? ?  ? ? ?EDUCATION NEEDS:  ?No education needs have been identified at this time ? ?Skin:  Skin Assessment: Reviewed RN Assessment ? ?Last BM:  3/12 ? ?Height:  ?Ht Readings from Last 1 Encounters:  ?08/13/21 '5\' 3"'$  (1.6 m)  ? ? ?Weight:  ?Wt Readings from Last 1 Encounters:  ?05/10/21 73.8 kg  ? ? ?Ideal Body Weight:  52.3 kg ? ?BMI:  Body mass  index is 28.84 kg/m?. ? ?Estimated Nutritional Needs:  ?Kcal:  1500-1700 kcal/d ?Protein:  75-90 g/d ?Fluid:  1.6-1.8L/d ? ? ?Ranell Patrick, RD, LDN ?Clinical Dietitian ?RD pager # available in [place]  ?After hours/weekend pager # available in [place] ?

## 2021-08-13 NOTE — Plan of Care (Signed)
?  Problem: Activity: ?Goal: Ability to tolerate increased activity will improve ?Outcome: Progressing ?  ?Problem: Respiratory: ?Goal: Ability to maintain a clear airway will improve ?Outcome: Progressing ?  ?Problem: Respiratory: ?Goal: Ability to maintain adequate ventilation will improve ?Outcome: Progressing ?  ?Problem: Respiratory: ?Goal: Levels of oxygenation will improve ?Outcome: Progressing ?  ?

## 2021-08-13 NOTE — Progress Notes (Signed)
ANTICOAGULATION CONSULT NOTE - Initial Consult ? ?Pharmacy Consult for warfarin ?Indication: atrial fibrillation ? ?Vital Signs: ?Temp: 98.2 ?F (36.8 ?C) (03/12 0758) ?Temp Source: Oral (03/12 0758) ?BP: 162/64 (03/12 0758) ?Pulse Rate: 69 (03/12 0758) ? ?Labs: ?Recent Labs  ?  08/11/21 ?1220 08/12/21 ?0241 08/13/21 ?0118  ?HGB 15.1* 13.1 12.6  ?HCT 45.8 39.8 39.0  ?PLT 206 183 205  ?LABPROT 25.6*  --  34.4*  ?INR 2.3*  --  3.4*  ?CREATININE 0.76 0.70 0.65  ? ? ? ?CrCl cannot be calculated (Unknown ideal weight.). ? ?Assessment: ?Patient is reported with shortness of breath and  Pharmacy consulted to dose warfarin. Last visit to [place] clinic measured INR of 2.8. Patient has received azithromycin and cefepime, which can increase bleeding risk. INR this AM is 3.4 which is supratherapeutic. Last dose of warfarin was 10 mg on 3/11 at 0800. CBC wnl but downtrending since admission.  ? ?PTA regimen: warfarin 5 mg tablets - 1.5 tablets daily accept accept 2 tablets on Monday and Friday ? ?Goal of Therapy:  ?INR 2-3 per coag note ?Monitor platelets by anticoagulation protocol: Yes ?  ?Plan:  ?Warfarin 5 MG x1 today due to elevated INR  ?Monitor daily INR and CBC ?Monitor for s/sx of bleed  ? ?Cephus Slater, PharmD, MBA ?Pharmacy Resident ?[phone] ?08/13/2021 8:07 AM  ? ? ? ?

## 2021-08-13 NOTE — Assessment & Plan Note (Addendum)
She is afebrile, likely associated with the steroids, continue to monitor.Improving ?

## 2021-08-13 NOTE — TOC Initial Note (Signed)
Transition of Care (TOC) - Initial/Assessment Note  ? ? ?Patient Details  ?Name: Teresa Frey ?MRN: [id number] ?Date of Birth: 15-Apr-1930 ? ?Transition of Care (TOC) CM/SW Contact:    ?Bartholomew Crews, RN ?Phone Number: [phone] ?08/13/2021, 10:12 AM ? ?Clinical Narrative:                 ? ?Spoke with patient at the bedside to discuss post acute transition. PTA home alone, but stated that her daughter lives next door and a lady rents her basement. Is not on home oxygen, but has 2 walkers, cane, and wheelchair. She stated that the plan for today is to wean her oxygen. No current [place] agency, but has uses [facility] about 3 years ago after joint surgery. Typically independent with adls/Iadls and drives self to appointments. Stated that her daughter will provide transportation home. She is agreeable to [facility] referral for PT/OT - first choice is [facility] - referral pending.  ? ?Expected Discharge Plan: [place] ?Barriers to Discharge: Continued Medical Work up ? ? ?Patient Goals and CMS Choice ?Patient states their goals for this hospitalization and ongoing recovery are:: return to her home ?CMS Medicare.gov Compare Post Acute Care list provided to:: Patient ?Choice offered to / list presented to : Patient ? ?Expected Discharge Plan and Services ?Expected Discharge Plan: [place] ?  ?Discharge Planning Services: CM Consult ?Post Acute Care Choice: Home Health ?Living arrangements for the past 2 months: [place] ?                ?DME Arranged: N/A ?DME Agency: NA ?  ?  ?  ?HH Arranged: PT, OT ?  ?  ?  ?  ? ?Prior Living Arrangements/Services ?Living arrangements for the past 2 months: [place] ?Lives with:: Self ?Patient language and need for interpreter reviewed:: Yes ?Do you feel safe going back to the place where you live?: Yes      ?Need for Family Participation in Patient Care: Yes (Comment) ?Care giver support system in place?: Yes (comment) ?Current home  services: DME (cane, 2 walkers, wheelchair) ?Criminal Activity/Legal Involvement Pertinent to Current Situation/Hospitalization: No - Comment as needed ? ?Activities of Daily Living ?  ?  ? ?Permission Sought/Granted ?  ?  ?   ?   ?   ?   ? ?Emotional Assessment ?Appearance:: Appears stated age ?Attitude/Demeanor/Rapport: Engaged ?Affect (typically observed): Accepting ?Orientation: : Oriented to Self, Oriented to Place, Oriented to  Time, Oriented to Situation ?Alcohol / Substance Use: Not Applicable ?Psych Involvement: No (comment) ? ?Admission diagnosis:  COPD exacerbation ([place]) [J44.1] ?Acute respiratory failure with hypoxia ([place]) [J96.01] ?COPD with acute exacerbation ([place]) [J44.1] ?Community acquired pneumonia, unspecified laterality [J18.9] ?Patient Active Problem List  ? Diagnosis Date Noted  ? Physical debility 08/12/2021  ? COPD exacerbation ([place]) 08/12/2021  ? COPD with acute exacerbation ([place]) 08/11/2021  ? DNR (do not resuscitate) 08/11/2021  ? Atrial fibrillation with RVR ([place]) 09/28/2020  ? Multiple falls 11/13/2019  ? COPD (chronic obstructive pulmonary disease) ([place]) 09/21/2019  ? Educated about COVID-19 virus infection 09/09/2019  ? Spondylosis without myelopathy or radiculopathy, lumbar region 02/12/2019  ? Spondylolisthesis of lumbar region 02/12/2019  ? Pain of left hip joint 12/23/2018  ? Primary osteoarthritis of left knee 12/23/2018  ? Periprosthetic fracture around internal prosthetic left hip joint ([place]) 04/19/2018  ? Long term (current) use of anticoagulants [Z79.01] 08/14/2017  ? Genetic testing 04/01/2016  ? possible  sleep apnea by history 02/12/2016  ? A-fib ([place]) 02/11/2016  ? Surgical wound infection 02/11/2016  ? Breast cancer of upper-outer quadrant of right female breast ([place]) 01/11/2016  ? Impaired glucose tolerance 07/28/2014  ? Anxiety disorder, unspecified 04/07/2007  ? Osteoarthritis 04/07/2007  ? Essential hypertension 04/02/2007  ? History of colonic polyps 04/02/2007  ?  DIVERTICULITIS, HX OF 04/02/2007  ? ?PCP:  [name], [name], MD ?Pharmacy:   ?[facility] [place] ?[address] ?[address] ?[place] [address] ?Phone: [phone] Fax: [phone] ? ?[facility] [phone] - [place] [place] ?[place] [address] ?Phone: [phone] Fax: [phone] ? ? ? ? ?Social Determinants of Health (SDOH) Interventions ?  ? ?Readmission Risk Interventions ?No flowsheet data found. ? ? ?

## 2021-08-13 NOTE — Progress Notes (Addendum)
PROGRESS NOTE  Teresa Frey  GGY:[id number] DOB: July 04, 1929 DOA: 08/11/2021 PCP: [name], [name], MD   Brief Narrative:  Patient is a 88 year old female with history of remote breast cancer, paroxysmal A-fib on Coumadin, hypertension who presented with shortness of breath.  She reported 2 weeks history of getting cold, coughing, bringing up phlegm.  She reported fever at home.  At urgent care, she was hypoxic with saturation in the range of 70s on room air.  She was found to be febrile, had leukocytosis.  Chest x-ray showed possible bronchitis.  Patient was admitted for the management of COPD exacerbation with concurrent possible pneumonia.   Assessment & Plan:  Principal Problem:   COPD with acute exacerbation ([place]) Active Problems:   Anxiety disorder, unspecified   Essential hypertension   A-fib (HCC)   DNR (do not resuscitate)   Physical debility   COPD exacerbation ([place])   Leukocytosis   Assessment and Plan: * COPD with acute exacerbation ([place]) -With possible concurrent pneumonia -Patient's shortness of breath and productive cough are most likely caused by acute COPD exacerbation.  -She has history of COPD but has never required O2; she was in the 70s at [facility] and 89% here  -She does not have fever here but did at home PTA; she has significant leukocytosis.  -Chest x-ray suspicious for pneumonia -Nebulizers: scheduled Duoneb and prn albuterol -Solu-Medrol 60 mg IV BID -> Prednisone 40 mg PO daily -Continue ceftriaxone azithromycin -We will try to wean the oxygen -Continue mucolytic's  Leukocytosis   She is afebrile, likely associated with the steroids, continue to monitor.  Physical debility PT/OT recommending home health on discharge  DNR (do not resuscitate) Code status is DNR  A-fib ([place]) -Paroxysmal A-fib.  Currently in normal sinus rhythm -Rate controlled with metoprolol -Continue Coumadin, pharmacy to dose  Essential hypertension -Continue Lopressor,  HCTZ -Added prn IV hydralazine  Anxiety disorder, unspecified -Continue Zoloft              DVT prophylaxis:Coumadin warfarin (COUMADIN) tablet 5 mg     Code Status: DNR  Family Communication: Daughter on phone on 3/11  Patient status:Inpatient  Patient is from :Home  Anticipated discharge OJ:JKKX with [facility]  Estimated DC date:tomorrow  Consultants: None   Procedures:None  Antimicrobials:  Anti-infectives (From admission, onward)    Start     Dose/Rate Route Frequency Ordered Stop   08/13/21 1000  cefTRIAXone (ROCEPHIN) 2 g in sodium chloride 0.9 % 100 mL IVPB        2 g 200 mL/hr over 30 Minutes Intravenous Every 24 hours 08/12/21 1146     08/12/21 1245  azithromycin (ZITHROMAX) 500 mg in sodium chloride 0.9 % 250 mL IVPB        500 mg 250 mL/hr over 60 Minutes Intravenous Every 24 hours 08/12/21 1146     08/12/21 0900  cefTRIAXone (ROCEPHIN) 1 g in sodium chloride 0.9 % 100 mL IVPB  Status:  Discontinued        1 g 200 mL/hr over 30 Minutes Intravenous Every 24 hours 08/12/21 0813 08/12/21 1146   08/11/21 1500  ceFEPIme (MAXIPIME) 2 g in sodium chloride 0.9 % 100 mL IVPB  Status:  Discontinued        2 g 200 mL/hr over 30 Minutes Intravenous Every 12 hours 08/11/21 1415 08/12/21 0813   08/11/21 1300  cefTRIAXone (ROCEPHIN) 1 g in sodium chloride 0.9 % 100 mL IVPB        1 g 200 mL/hr  over 30 Minutes Intravenous  Once 08/11/21 1250 08/11/21 1422   08/11/21 1300  azithromycin (ZITHROMAX) 500 mg in sodium chloride 0.9 % 250 mL IVPB        500 mg 250 mL/hr over 60 Minutes Intravenous  Once 08/11/21 1250 08/11/21 1540       Subjective:  Patient seen and examined at bedside this morning.  Hemodynamically stable.  Looks better today.  Sitting in the chair.  Still having cough.  We discussed about trying to wean the oxygen.  She feels better today.  We discussed about possibility of going home tomorrow  Objective: Vitals:   08/13/21 0528 08/13/21 0726 08/13/21  0758 08/13/21 1119  BP: (!) 149/53 (!) 171/65 (!) 162/64 (!) 148/67  Pulse: 60 63 69 69  Resp: '20 18 20 18  '$ Temp: 98.1 F (36.7 C) 97.9 F (36.6 C) 98.2 F (36.8 C) 97.7 F (36.5 C)  TempSrc: Oral Oral Oral Oral  SpO2: 95% 95% 94% 97%    Intake/Output Summary (Last 24 hours) at 08/13/2021 1133 Last data filed at 08/13/2021 0600 Gross per 24 hour  Intake 240 ml  Output 1700 ml  Net -1460 ml   There were no vitals filed for this visit.  Examination:  General exam: Deconditioned pleasant elderly female HEENT: PERRL Respiratory system:  no wheezes or crackles  Cardiovascular system: S1 & S2 heard, RRR.  Gastrointestinal system: Abdomen is nondistended, soft and nontender. Central nervous system: Alert and oriented Extremities: No edema, no clubbing ,no cyanosis Skin: No rashes, no ulcers,no icterus     Data Reviewed: I have personally reviewed following labs and imaging studies  CBC: Recent Labs  Lab 08/11/21 1220 08/12/21 0241 08/13/21 0118  WBC 23.5* 22.0* 24.3*  NEUTROABS 19.3*  --  21.5*  HGB 15.1* 13.1 12.6  HCT 45.8 39.8 39.0  MCV 92.5 92.1 92.9  PLT 206 183 748   Basic Metabolic Panel: Recent Labs  Lab 08/11/21 1220 08/12/21 0241 08/13/21 0118  NA 139 141 141  K 3.6 4.1 4.4  CL 103 108 108  CO2 '25 26 26  '$ GLUCOSE 156* 157* 140*  BUN '13 15 22  '$ CREATININE 0.76 0.70 0.65  CALCIUM 9.3 9.1 8.8*     Recent Results (from the past 240 hour(s))  Resp Panel by RT-PCR (Flu A&B, Covid) Nasopharyngeal Swab     Status: None   Collection Time: 08/11/21 12:08 PM   Specimen: Nasopharyngeal Swab; Nasopharyngeal(NP) swabs in vial transport medium  Result Value Ref Range Status   SARS Coronavirus 2 by RT PCR NEGATIVE NEGATIVE Final    Comment: (NOTE) SARS-CoV-2 target nucleic acids are NOT DETECTED.  The SARS-CoV-2 RNA is generally detectable in upper respiratory specimens during the acute phase of infection. The lowest concentration of SARS-CoV-2 viral copies  this assay can detect is 138 copies/mL. A negative result does not preclude SARS-Cov-2 infection and should not be used as the sole basis for treatment or other patient management decisions. A negative result may occur with  improper specimen collection/handling, submission of specimen other than nasopharyngeal swab, presence of viral mutation(s) within the areas targeted by this assay, and inadequate number of viral copies(<138 copies/mL). A negative result must be combined with clinical observations, patient history, and epidemiological information. The expected result is Negative.  Fact Sheet for Patients:  EntrepreneurPulse.com.au  Fact Sheet for Healthcare Providers:  IncredibleEmployment.be  This test is no t yet approved or cleared by the [place] and  has been authorized for  detection and/or diagnosis of SARS-CoV-2 by FDA under an Emergency Use Authorization (EUA). This EUA will remain  in effect (meaning this test can be used) for the duration of the COVID-19 declaration under Section 564(b)(1) of the Act, 21 U.S.C.section 360bbb-3(b)(1), unless the authorization is terminated  or revoked sooner.       Influenza A by PCR NEGATIVE NEGATIVE Final   Influenza B by PCR NEGATIVE NEGATIVE Final    Comment: (NOTE) The Xpert Xpress SARS-CoV-2/FLU/RSV plus assay is intended as an aid in the diagnosis of influenza from Nasopharyngeal swab specimens and should not be used as a sole basis for treatment. Nasal washings and aspirates are unacceptable for Xpert Xpress SARS-CoV-2/FLU/RSV testing.  Fact Sheet for Patients: EntrepreneurPulse.com.au  Fact Sheet for Healthcare Providers: IncredibleEmployment.be  This test is not yet approved or cleared by the [place] FDA and has been authorized for detection and/or diagnosis of SARS-CoV-2 by FDA under an Emergency Use Authorization (EUA). This EUA  will remain in effect (meaning this test can be used) for the duration of the COVID-19 declaration under Section 564(b)(1) of the Act, 21 U.S.C. section 360bbb-3(b)(1), unless the authorization is terminated or revoked.  Performed at [facility], [place] [address] [place], [place] [address]   Blood culture (routine x 2)     Status: None (Preliminary result)   Collection Time: 08/11/21  1:55 PM   Specimen: BLOOD  Result Value Ref Range Status   Specimen Description BLOOD SITE NOT SPECIFIED  Final   Special Requests   Final    BOTTLES DRAWN AEROBIC AND ANAEROBIC Blood Culture results may not be optimal due to an excessive volume of blood received in culture bottles   Culture   Final    NO GROWTH 2 DAYS Performed at [facility], [place] [address] [place], [place] [address]    Report Status PENDING  Incomplete  Blood culture (routine x 2)     Status: None (Preliminary result)   Collection Time: 08/11/21  1:57 PM   Specimen: BLOOD  Result Value Ref Range Status   Specimen Description BLOOD SITE NOT SPECIFIED  Final   Special Requests   Final    BOTTLES DRAWN AEROBIC AND ANAEROBIC Blood Culture adequate volume   Culture   Final    NO GROWTH 2 DAYS Performed at [facility], [address] [place], [place] [address]    Report Status PENDING  Incomplete     Radiology Studies: DG Chest Portable 1 View  Result Date: 08/11/2021 CLINICAL DATA:  Shortness of breath, cough for 2 weeks EXAM: PORTABLE CHEST 1 VIEW COMPARISON:  06/17/2018 FINDINGS: Bilateral mild interstitial thickening. Left basilar atelectasis. No pleural effusion or pneumothorax. Heart and mediastinal contours are unremarkable. No acute osseous abnormality. IMPRESSION: 1. Bilateral interstitial thickening which may reflect mild interstitial edema versus infection. Electronically Signed   By: Kathreen Devoid M.D.   On: 08/11/2021 12:25    Scheduled Meds:  docusate sodium  100 mg Oral BID   hydrochlorothiazide   12.5 mg Oral Daily   metoprolol tartrate  25 mg Oral BID   predniSONE  40 mg Oral Q breakfast   sertraline  50 mg Oral Daily   warfarin  5 mg Oral ONCE-1600   Warfarin - Pharmacist Dosing Inpatient   Does not apply q1600   Continuous Infusions:  azithromycin (ZITHROMAX) 500 MG IVPB (Vial-Mate [employer]) 500 mg (08/12/21 1240)   cefTRIAXone (ROCEPHIN)  IV  LOS: 1 day   Shelly Coss, MD [facility] P3/05/2022, 11:33 AM

## 2021-08-14 LAB — CBC WITH DIFFERENTIAL/PLATELET
Abs Immature Granulocytes: 0.11 10*3/uL — ABNORMAL HIGH (ref 0.00–0.07)
Basophils Absolute: 0 10*3/uL (ref 0.0–0.1)
Basophils Relative: 0 %
Eosinophils Absolute: 0 10*3/uL (ref 0.0–0.5)
Eosinophils Relative: 0 %
HCT: 43.2 % (ref 36.0–46.0)
Hemoglobin: 14.2 g/dL (ref 12.0–15.0)
Immature Granulocytes: 1 %
Lymphocytes Relative: 16 %
Lymphs Abs: 2.2 10*3/uL (ref 0.7–4.0)
MCH: 30.3 pg (ref 26.0–34.0)
MCHC: 32.9 g/dL (ref 30.0–36.0)
MCV: 92.3 fL (ref 80.0–100.0)
Monocytes Absolute: 1 10*3/uL (ref 0.1–1.0)
Monocytes Relative: 7 %
Neutro Abs: 10.5 10*3/uL — ABNORMAL HIGH (ref 1.7–7.7)
Neutrophils Relative %: 76 %
Platelets: 214 10*3/uL (ref 150–400)
RBC: 4.68 MIL/uL (ref 3.87–5.11)
RDW: 13.8 % (ref 11.5–15.5)
WBC: 13.9 10*3/uL — ABNORMAL HIGH (ref 4.0–10.5)
nRBC: 0 % (ref 0.0–0.2)

## 2021-08-14 LAB — PROTIME-INR
INR: 2.8 — ABNORMAL HIGH (ref 0.8–1.2)
Prothrombin Time: 29.5 seconds — ABNORMAL HIGH (ref 11.4–15.2)

## 2021-08-14 MED ORDER — IPRATROPIUM-ALBUTEROL 0.5-2.5 (3) MG/3ML IN SOLN
3.0000 mL | Freq: Four times a day (QID) | RESPIRATORY_TRACT | Status: DC
  Administered 2021-08-14 – 2021-08-15 (×4): 3 mL via RESPIRATORY_TRACT
  Filled 2021-08-14 (×4): qty 3

## 2021-08-14 MED ORDER — WARFARIN SODIUM 7.5 MG PO TABS
7.5000 mg | ORAL_TABLET | Freq: Once | ORAL | Status: AC
  Administered 2021-08-14: 7.5 mg via ORAL
  Filled 2021-08-14: qty 1

## 2021-08-14 MED ORDER — METHYLPREDNISOLONE SODIUM SUCC 40 MG IJ SOLR: 40.0000 mg | Freq: Two times a day (BID) | INTRAMUSCULAR | Status: DC

## 2021-08-14 MED ORDER — BUDESONIDE 0.5 MG/2ML IN SUSP
0.5000 mg | Freq: Two times a day (BID) | RESPIRATORY_TRACT | Status: DC
  Administered 2021-08-14 – 2021-08-15 (×2): 0.5 mg via RESPIRATORY_TRACT
  Filled 2021-08-14 (×2): qty 2

## 2021-08-14 MED ORDER — METHYLPREDNISOLONE SODIUM SUCC 125 MG IJ SOLR
80.0000 mg | INTRAMUSCULAR | Status: DC
  Administered 2021-08-14 – 2021-08-15 (×2): 80 mg via INTRAVENOUS
  Filled 2021-08-14 (×2): qty 2

## 2021-08-14 NOTE — Progress Notes (Signed)
Physical Therapy Treatment ?Patient Details ?Name: Teresa Frey ?MRN: [id number] ?DOB: [birth year] ?Today's Date: 08/14/2021 ? ? ?History of Present Illness 88 y.o. female with medical history significant of remote breast cancer, afib on Coumadin, and HTN presenting with SOB. Admitted for COPD exacerbation and post-viral pneumonia ? ?  ?PT Comments  ? ? Focus of session today was functional tranfers, gait, and stair training. The patient tolerated well.  Pt. Shows overall improvement with her functional endurance, strength, and balance. Overall oxygen saturation and fatigue are still limiting function. Her oxygen saturation would drop to ~ 89% after brief bouts of activity but would quickly climb to >93% with seated rest for 1-2 minutes. Pt was educated on pursed lip breathing and received it well. Pt. Would benefit from skilled PT to continue to address her functional endurance, dynamic balance, and gait capacity. Plan and discharge setting remains unchanged. Pt to follow acutely as appropriate.  ?   ?Recommendations for follow up therapy are one component of a multi-disciplinary discharge planning process, led by the attending physician.  Recommendations may be updated based on patient status, additional functional criteria and insurance authorization. ? ?Follow Up Recommendations ? Home health PT ?  ?  ?Assistance Recommended at Discharge PRN  ?Patient can return home with the following Assist for transportation;Help with stairs or ramp for entrance;A little help with walking and/or transfers ?  ?Equipment Recommendations ? None recommended by PT  ?  ?Recommendations for Other Services   ? ? ?  ?Precautions / Restrictions Precautions ?Precautions: Fall ?Restrictions ?Weight Bearing Restrictions: No  ?  ? ?Mobility ? Bed Mobility ?Overal bed mobility: Modified Independent ?  ?  ?  ?  ?  ?  ?General bed mobility comments: Pt. in chair upon arrival ?  ? ?Transfers ?Overall transfer level: Needs assistance ?Equipment  used: Rolling walker (2 wheels) ?Transfers: Sit to/from Stand ?Sit to Stand: Supervision ?  ?  ?  ?  ?  ?General transfer comment: Patient choosing to use rolling walker, not on supplamental 02 ?  ? ?Ambulation/Gait ?Ambulation/Gait assistance: Supervision ?Gait Distance (Feet): 150 Feet ?Assistive device: Rolling walker (2 wheels) ?Gait Pattern/deviations: Step-through pattern, Decreased stride length, Decreased weight shift to right ?Gait velocity: decreased ?  ?  ?General Gait Details: Supervision for saftey and line management. Pt. used RW for ambulation with minimal reports of SOB. Pt. takes seated rest break and SpO2 89%, instructed in pursed lip breathing and saturation came back up to 94%. SpO2 88% upon return to room after ambulation and stairs, quickly climbed with seated rest and pursed lip breathing. ? ? ?Stairs ?Stairs: Yes ?Stairs assistance: Supervision ?Stair Management: One rail Right ?Number of Stairs: 2 ?General stair comments: Cues for use of rail on right side and not therapist for support. Management of IV lines. ? ? ?Wheelchair Mobility ?  ? ?Modified Rankin (Stroke Patients Only) ?  ? ? ?  ?Balance Overall balance assessment: Modified Independent ?Sitting-balance support: No upper extremity supported, Feet supported ?Sitting balance-Leahy Scale: Good ?  ?  ?Standing balance support: Bilateral upper extremity supported ?Standing balance-Leahy Scale: Poor ?Standing balance comment: Pt. requests use of RW for dynamic activities secondary to weakness. Uses a cane at baseline. ?  ?  ?  ?  ?  ?  ?  ?  ?  ?  ?  ?  ? ?  ?Cognition Arousal/Alertness: Awake/alert ?Behavior During Therapy: [facility] for tasks assessed/performed ?Overall Cognitive Status: Within Functional Limits for tasks assessed ?  ?  ?  ?  ?  ?  ?  ?  ?  ?  ?  ?  ?  ?  ?  ?  ?  ?  ?  ? ?  ?  Exercises   ? ?  ?General Comments General comments (skin integrity, edema, etc.): RA throughout treatment SpO2 88-94%, seated rest when dropping below  90% ?  ?  ? ?Pertinent Vitals/Pain Pain Assessment ?Pain Assessment: No/denies pain  ? ? ?Home Living   ?  ?  ?  ?  ?  ?  ?  ?  ?  ?   ?  ?Prior Function    ?  ?  ?   ? ?PT Goals (current goals can now be found in the care plan section) Acute Rehab PT Goals ?Patient Stated Goal: Get well ?PT Goal Formulation: With patient ?Time For Goal Achievement: 08/26/21 ?Potential to Achieve Goals: Good ?Progress towards PT goals: Progressing toward goals ? ?  ?Frequency ? ? ? Min 3X/week ? ? ? ?  ?PT Plan Current plan remains appropriate  ? ? ?Co-evaluation   ?  ?  ?  ?  ? ?  ?AM-PAC PT "6 Clicks" Mobility   ?Outcome Measure ? Help needed turning from your back to your side while in a flat bed without using bedrails?: None ?Help needed moving from lying on your back to sitting on the side of a flat bed without using bedrails?: None ?Help needed moving to and from a bed to a chair (including a wheelchair)?: A Little ?Help needed standing up from a chair using your arms (e.g., wheelchair or bedside chair)?: A Little ?Help needed to walk in hospital room?: A Little ?Help needed climbing 3-5 steps with a railing? : A Little ?6 Click Score: 20 ? ?  ?End of Session Equipment Utilized During Treatment: Gait belt ?Activity Tolerance: Patient tolerated treatment well ?Patient left: in chair;with call bell/phone within reach ?Nurse Communication: Mobility status;Other (comment) (Oxygen level throughout the session) ?PT Visit Diagnosis: Unsteadiness on feet (R26.81);Muscle weakness (generalized) (M62.81);Difficulty in walking, not elsewhere classified (R26.2) ?  ? ? ?Time: 6789-3810 ?PT Time Calculation (min) (ACUTE ONLY): 17 min ? ?Charges:  $Gait Training: 8-22 mins          ?          ? ?Thermon Leyland, SPT ?Acute Rehab Services ? ? ? ?Thermon Leyland ?08/14/2021, 3:08 PM ? ?

## 2021-08-14 NOTE — Progress Notes (Signed)
Occupational Therapy Treatment ?Patient Details ?Name: Teresa Frey ?MRN: [id number] ?DOB: 24-Feb-1930 ?Today's Date: 08/14/2021 ? ? ?History of present illness 88 y.o. female with medical history significant of remote breast cancer, afib on Coumadin, and HTN presenting with SOB. Admitted for COPD exacerbation and post-viral pneumonia ?  ?OT comments ? Pt readily willing to work with OT. Choosing to use RW with supervision. Maintained Sp02 of 90% on RA during seated dressing and standing grooming. Educated in energy conservation and provided written handout.   ? ?Recommendations for follow up therapy are one component of a multi-disciplinary discharge planning process, led by the attending physician.  Recommendations may be updated based on patient status, additional functional criteria and insurance authorization. ?   ?Follow Up Recommendations ? Home health OT  ?  ?Assistance Recommended at Discharge PRN  ?Patient can return home with the following ? Assistance with cooking/housework;Help with stairs or ramp for entrance ?  ?Equipment Recommendations ? None recommended by OT  ?  ?Recommendations for Other Services   ? ?  ?Precautions / Restrictions Precautions ?Precautions: Fall  ? ? ?  ? ?Mobility Bed Mobility ?Overal bed mobility: Modified Independent ?  ?  ?  ?  ?  ?  ?  ?  ? ?Transfers ?Overall transfer level: Needs assistance ?Equipment used: Rolling walker (2 wheels) ?Transfers: Sit to/from Stand ?Sit to Stand: Supervision ?  ?  ?  ?  ?  ?General transfer comment: Patient choosing to use rolling walker ?  ?  ?Balance Overall balance assessment: Modified Independent ?  ?  ?  ?  ?  ?  ?  ?  ?  ?  ?  ?  ?  ?  ?  ?  ?  ?  ?   ? ?ADL either performed or assessed with clinical judgement  ? ?ADL Overall ADL's : Needs assistance/impaired ?  ?  ?Grooming: Oral care;Brushing hair;Standing;Supervision/safety ?  ?  ?  ?  ?  ?Upper Body Dressing : Set up;Sitting ?  ?Lower Body Dressing: Sitting/lateral leans;Minimal  assistance ?  ?  ?  ?  ?  ?  ?  ?Functional mobility during ADLs: Supervision/safety;Rolling walker (2 wheels) ?General ADL Comments: Educated in energy consrvation and provided handout ?  ? ?Extremity/Trunk Assessment   ?  ?  ?  ?  ?  ? ?Vision   ?  ?  ?Perception   ?  ?Praxis   ?  ? ?Cognition Arousal/Alertness: Awake/alert ?Behavior During Therapy: [facility] for tasks assessed/performed ?Overall Cognitive Status: Within Functional Limits for tasks assessed ?  ?  ?  ?  ?  ?  ?  ?  ?  ?  ?  ?  ?  ?  ?  ?  ?  ?  ?  ?   ?Exercises   ? ?  ?Shoulder Instructions   ? ? ?  ?General Comments    ? ? ?Pertinent Vitals/ Pain       Pain Assessment ?Pain Assessment: No/denies pain ? ?Home Living   ?  ?  ?  ?  ?  ?  ?  ?  ?  ?  ?  ?  ?  ?  ?  ?  ?  ?  ? ?  ?Prior Functioning/Environment    ?  ?  ?  ?   ? ?Frequency ? Min 2X/week  ? ? ? ? ?  ?Progress Toward Goals ? ?OT Goals(current goals can now be found  in the care plan section) ? Progress towards OT goals: Progressing toward goals ? ?Acute Rehab OT Goals ?Time For Goal Achievement: 08/26/21 ?Potential to Achieve Goals: Good  ?Plan Discharge plan remains appropriate   ? ?Co-evaluation ? ? ?   ?  ?  ?  ?  ? ?  ?AM-PAC OT "6 Clicks" Daily Activity     ?Outcome Measure ? ? Help from another person eating meals?: None ?Help from another person taking care of personal grooming?: A Little ?Help from another person toileting, which includes using toliet, bedpan, or urinal?: A Little ?Help from another person bathing (including washing, rinsing, drying)?: A Little ?Help from another person to put on and taking off regular upper body clothing?: A Little ?Help from another person to put on and taking off regular lower body clothing?: A Little ?6 Click Score: 19 ? ?  ?End of Session Equipment Utilized During Treatment: Rolling walker (2 wheels) ? ?OT Visit Diagnosis: Muscle weakness (generalized) (M62.81) (Decreased activity tolerance) ?  ?Activity Tolerance Patient tolerated treatment well ?   ?Patient Left in chair;with call bell/phone within reach ?  ?Nurse Communication  (Activity tolerance) ?  ? ?   ? ?Time: 1103-1594 ?OT Time Calculation (min): 37 min ? ?Charges: OT General Charges ?$OT Visit: 1 Visit ?OT Treatments ?$Self Care/Home Management : 23-37 mins ? ?Nestor Lewandowsky, OTR/L ?Acute Rehabilitation Services ?Pager: [phone] ?Office: [phone]  ?Teresa Frey ?08/14/2021, 10:33 AM ?

## 2021-08-14 NOTE — Progress Notes (Signed)
PROGRESS NOTE  Teresa Frey  OJJ:[id number] DOB: [birth year] DOA: 08/11/2021 PCP: [name], [name], MD   Brief Narrative:  Patient is a 88 year old female with history of remote breast cancer, paroxysmal A-fib on Coumadin, hypertension who presented with shortness of breath.  She reported 2 weeks history of getting cold, coughing, bringing up phlegm.  She reported fever at home.  At urgent care, she was hypoxic with saturation in the range of 70s on room air.  She was found to be febrile, had leukocytosis.  Chest x-ray showed possible bronchitis.  Patient was admitted for the management of COPD exacerbation with concurrent possible pneumonia.  Hospital course remarkable for slow improvement in the respiratory status, but has been weaned to room air now.  Possible plan for discharge home tomorrow with home health.   Assessment & Plan:  Principal Problem:   COPD with acute exacerbation ([place]) Active Problems:   Anxiety disorder, unspecified   Essential hypertension   A-fib (HCC)   DNR (do not resuscitate)   Physical debility   COPD exacerbation ([place])   Leukocytosis   Assessment and Plan: * COPD with acute exacerbation ([place]) -With possible concurrent pneumonia -Patient's shortness of breath and productive cough are most likely caused by acute COPD exacerbation.  -She has history of COPD but has never required O2; she was in the 70s at [facility] and 89% here  -She does not have fever here but did at home PTA; she has significant leukocytosis.  -Chest x-ray suspicious for pneumonia -Continue ceftriaxone azithromycin -Continue mucolytic's -She has been weaned to room air, hopefully she will not require oxygen on for home -Noted to have wheezing, rhonchi today, started on scheduled DuoNeb, Pulmicort, steroids changed to IV  Leukocytosis   She is afebrile, likely associated with the steroids, continue to monitor.Improving  Physical debility PT/OT recommending home health on discharge  DNR (do  not resuscitate) Code status is DNR  A-fib ([place]) -Paroxysmal A-fib.  Currently in normal sinus rhythm -Rate controlled with metoprolol -Continue Coumadin, pharmacy to dose  Essential hypertension -Continue Lopressor, HCTZ -Currently normotensive  Anxiety disorder, unspecified -Continue Zoloft         Nutrition Problem: Inadequate oral intake Etiology: decreased appetite    DVT prophylaxis:Coumadin warfarin (COUMADIN) tablet 7.5 mg     Code Status: DNR  Family Communication: Daughter on phone on 3/11  Patient status:Inpatient  Patient is from :Home  Anticipated discharge HB:ZJIR with [facility]  Estimated DC date:tomorrow  Consultants: None   Procedures:None  Antimicrobials:  Anti-infectives (From admission, onward)    Start     Dose/Rate Route Frequency Ordered Stop   08/13/21 1000  cefTRIAXone (ROCEPHIN) 2 g in sodium chloride 0.9 % 100 mL IVPB        2 g 200 mL/hr over 30 Minutes Intravenous Every 24 hours 08/12/21 1146     08/12/21 1245  azithromycin (ZITHROMAX) 500 mg in sodium chloride 0.9 % 250 mL IVPB        500 mg 250 mL/hr over 60 Minutes Intravenous Every 24 hours 08/12/21 1146     08/12/21 0900  cefTRIAXone (ROCEPHIN) 1 g in sodium chloride 0.9 % 100 mL IVPB  Status:  Discontinued        1 g 200 mL/hr over 30 Minutes Intravenous Every 24 hours 08/12/21 0813 08/12/21 1146   08/11/21 1500  ceFEPIme (MAXIPIME) 2 g in sodium chloride 0.9 % 100 mL IVPB  Status:  Discontinued        2 g 200  mL/hr over 30 Minutes Intravenous Every 12 hours 08/11/21 1415 08/12/21 0813   08/11/21 1300  cefTRIAXone (ROCEPHIN) 1 g in sodium chloride 0.9 % 100 mL IVPB        1 g 200 mL/hr over 30 Minutes Intravenous  Once 08/11/21 1250 08/11/21 1422   08/11/21 1300  azithromycin (ZITHROMAX) 500 mg in sodium chloride 0.9 % 250 mL IVPB        500 mg 250 mL/hr over 60 Minutes Intravenous  Once 08/11/21 1250 08/11/21 1540       Subjective:  Patient seen and examined at the  bedside this morning.  She does not feel very good today.  Though she has been weaned to room air, she has some shortness of breath.  Auscultation reveals rhonchi, bilateral wheezing.  Discharge canceled  Objective: Vitals:   08/13/21 1732 08/13/21 2035 08/14/21 0417 08/14/21 0732  BP:  (!) 166/62 (!) 175/70 127/81  Pulse:  (!) 59 62 71  Resp:  '18 18 16  '$ Temp:  98.3 F (36.8 C) 98.1 F (36.7 C) 98.5 F (36.9 C)  TempSrc:  Oral Oral Oral  SpO2:  94% 93% 91%  Height: '5\' 3"'$  (1.6 m)       Intake/Output Summary (Last 24 hours) at 08/14/2021 1043 Last data filed at 08/13/2021 2035 Gross per 24 hour  Intake --  Output 400 ml  Net -400 ml   There were no vitals filed for this visit.  Examination:  General exam: Deconditioned pleasant elderly female HEENT: PERRL Respiratory system: Bilateral expiratory wheezing, rhonchi Cardiovascular system: S1 & S2 heard, RRR.  Gastrointestinal system: Abdomen is nondistended, soft and nontender. Central nervous system: Alert and oriented Extremities: No edema, no clubbing ,no cyanosis Skin: No rashes, no ulcers,no icterus     Data Reviewed: I have personally reviewed following labs and imaging studies  CBC: Recent Labs  Lab 08/11/21 1220 08/12/21 0241 08/13/21 0118 08/14/21 0141  WBC 23.5* 22.0* 24.3* 13.9*  NEUTROABS 19.3*  --  21.5* 10.5*  HGB 15.1* 13.1 12.6 14.2  HCT 45.8 39.8 39.0 43.2  MCV 92.5 92.1 92.9 92.3  PLT 206 183 205 841   Basic Metabolic Panel: Recent Labs  Lab 08/11/21 1220 08/12/21 0241 08/13/21 0118  NA 139 141 141  K 3.6 4.1 4.4  CL 103 108 108  CO2 '25 26 26  '$ GLUCOSE 156* 157* 140*  BUN '13 15 22  '$ CREATININE 0.76 0.70 0.65  CALCIUM 9.3 9.1 8.8*     Recent Results (from the past 240 hour(s))  Resp Panel by RT-PCR (Flu A&B, Covid) Nasopharyngeal Swab     Status: None   Collection Time: 08/11/21 12:08 PM   Specimen: Nasopharyngeal Swab; Nasopharyngeal(NP) swabs in vial transport medium  Result Value  Ref Range Status   SARS Coronavirus 2 by RT PCR NEGATIVE NEGATIVE Final    Comment: (NOTE) SARS-CoV-2 target nucleic acids are NOT DETECTED.  The SARS-CoV-2 RNA is generally detectable in upper respiratory specimens during the acute phase of infection. The lowest concentration of SARS-CoV-2 viral copies this assay can detect is 138 copies/mL. A negative result does not preclude SARS-Cov-2 infection and should not be used as the sole basis for treatment or other patient management decisions. A negative result may occur with  improper specimen collection/handling, submission of specimen other than nasopharyngeal swab, presence of viral mutation(s) within the areas targeted by this assay, and inadequate number of viral copies(<138 copies/mL). A negative result must be combined with clinical observations, patient history,  and epidemiological information. The expected result is Negative.  Fact Sheet for Patients:  EntrepreneurPulse.com.au  Fact Sheet for Healthcare Providers:  IncredibleEmployment.be  This test is no t yet approved or cleared by the [place] FDA and  has been authorized for detection and/or diagnosis of SARS-CoV-2 by FDA under an Emergency Use Authorization (EUA). This EUA will remain  in effect (meaning this test can be used) for the duration of the COVID-19 declaration under Section 564(b)(1) of the Act, 21 U.S.C.section 360bbb-3(b)(1), unless the authorization is terminated  or revoked sooner.       Influenza A by PCR NEGATIVE NEGATIVE Final   Influenza B by PCR NEGATIVE NEGATIVE Final    Comment: (NOTE) The Xpert Xpress SARS-CoV-2/FLU/RSV plus assay is intended as an aid in the diagnosis of influenza from Nasopharyngeal swab specimens and should not be used as a sole basis for treatment. Nasal washings and aspirates are unacceptable for Xpert Xpress SARS-CoV-2/FLU/RSV testing.  Fact Sheet for  Patients: EntrepreneurPulse.com.au  Fact Sheet for Healthcare Providers: IncredibleEmployment.be  This test is not yet approved or cleared by the [place] FDA and has been authorized for detection and/or diagnosis of SARS-CoV-2 by FDA under an Emergency Use Authorization (EUA). This EUA will remain in effect (meaning this test can be used) for the duration of the COVID-19 declaration under Section 564(b)(1) of the Act, 21 U.S.C. section 360bbb-3(b)(1), unless the authorization is terminated or revoked.  Performed at [facility], [place] [address], [place], [place] [address]   Blood culture (routine x 2)     Status: None (Preliminary result)   Collection Time: 08/11/21  1:55 PM   Specimen: BLOOD  Result Value Ref Range Status   Specimen Description BLOOD SITE NOT SPECIFIED  Final   Special Requests   Final    BOTTLES DRAWN AEROBIC AND ANAEROBIC Blood Culture results may not be optimal due to an excessive volume of blood received in culture bottles   Culture   Final    NO GROWTH 3 DAYS Performed at [facility], [place] [address] [place], [place] [address]    Report Status PENDING  Incomplete  Blood culture (routine x 2)     Status: None (Preliminary result)   Collection Time: 08/11/21  1:57 PM   Specimen: BLOOD  Result Value Ref Range Status   Specimen Description BLOOD SITE NOT SPECIFIED  Final   Special Requests   Final    BOTTLES DRAWN AEROBIC AND ANAEROBIC Blood Culture adequate volume   Culture   Final    NO GROWTH 3 DAYS Performed at [facility], [address] [place],  [address]    Report Status PENDING  Incomplete     Radiology Studies: No results found.  Scheduled Meds:  budesonide (PULMICORT) nebulizer solution  0.5 mg Nebulization BID   docusate sodium  100 mg Oral BID   feeding supplement  237 mL Oral BID BM   hydrochlorothiazide  12.5 mg Oral Daily   ipratropium-albuterol  3 mL Nebulization  Q6H   methylPREDNISolone (SOLU-MEDROL) injection  80 mg Intravenous Q24H   metoprolol tartrate  25 mg Oral BID   sertraline  50 mg Oral Daily   warfarin  7.5 mg Oral ONCE-1600   Warfarin - Pharmacist Dosing Inpatient   Does not apply q1600   Continuous Infusions:  azithromycin (ZITHROMAX) 500 MG IVPB (Vial-Mate [employer]) 500 mg (08/13/21 1303)   cefTRIAXone (ROCEPHIN)  IV 2 g (08/14/21 0954)  LOS: 2 days   Teresa Coss, MD [facility] P3/13/2023, 10:43 AM

## 2021-08-14 NOTE — Plan of Care (Signed)
?  Problem: Education: ?Goal: Knowledge of disease or condition will improve ?Outcome: Progressing ?  ?Problem: Activity: ?Goal: Will verbalize the importance of balancing activity with adequate rest periods ?Outcome: Progressing ?  ?

## 2021-08-14 NOTE — TOC Progression Note (Signed)
Transition of Care (TOC) - Progression Note  ? ? ?Patient Details  ?Name: NOLIA TSCHANTZ ?MRN: [id number] ?Date of Birth: 1930-01-03 ? ?Transition of Care (TOC) CM/SW Contact  ?Marilu Favre, RN ?Phone Number: ?08/14/2021, 1:23 PM ? ?Clinical Narrative:    ? ?Followed up with Stacie with [facility] , she has accepted home health referral. Patient aware.  ? ?Expected Discharge Plan: [place] ?Barriers to Discharge: Continued Medical Work up ? ?Expected Discharge Plan and Services ?Expected Discharge Plan: [place] ?  ?Discharge Planning Services: CM Consult ?Post Acute Care Choice: Home Health ?Living arrangements for the past 2 months: [place] ?                ?DME Arranged: N/A ?DME Agency: NA ?  ?  ?  ?HH Arranged: PT, OT ?  ?  ?  ?  ? ? ?Social Determinants of Health (SDOH) Interventions ?  ? ?Readmission Risk Interventions ?No flowsheet data found. ? ?

## 2021-08-14 NOTE — Progress Notes (Signed)
ANTICOAGULATION CONSULT NOTE ? ?Pharmacy Consult for warfarin ?Indication: atrial fibrillation ? ?Vital Signs: ?Temp: 98.5 ?F (36.9 ?C) (03/13 0732) ?Temp Source: Oral (03/13 0732) ?BP: 127/81 (03/13 0732) ?Pulse Rate: 71 (03/13 0732) ? ?Labs: ?Recent Labs  ?  08/11/21 ?1220 08/12/21 ?0241 08/13/21 ?0118 08/14/21 ?0141  ?HGB 15.1* 13.1 12.6 14.2  ?HCT 45.8 39.8 39.0 43.2  ?PLT 206 183 205 214  ?LABPROT 25.6*  --  34.4* 29.5*  ?INR 2.3*  --  3.4* 2.8*  ?CREATININE 0.76 0.70 0.65  --   ? ? ? ?CrCl cannot be calculated (Unknown ideal weight.). ? ?Assessment: ?Pt on warfarin PTA for afib. INR 2.8 (therapeutic) today - trending down. CBC stable. Noted pt on ceftriaxone and azithromycin which may potentiate affects of warfarin. ? ?PTA regimen: warfarin 7.'5mg'$  daily except for '10mg'$  on Mon and Fri ? ?Goal of Therapy:  ?INR 2-3 per coag note ?Monitor platelets by anticoagulation protocol: Yes ?  ?Plan:  ?Warfarin 7.5 MG x1 today  ?Monitor daily INR and CBC ? ?Sherlon Handing, PharmD, BCPS ?Please see amion for complete clinical pharmacist phone list ?08/14/2021 8:25 AM  ? ? ? ?

## 2021-08-15 LAB — PROTIME-INR
INR: 2.7 — ABNORMAL HIGH (ref 0.8–1.2)
Prothrombin Time: 28.6 seconds — ABNORMAL HIGH (ref 11.4–15.2)

## 2021-08-15 MED ORDER — IPRATROPIUM-ALBUTEROL 0.5-2.5 (3) MG/3ML IN SOLN: 3.0000 mL | Freq: Four times a day (QID) | RESPIRATORY_TRACT | 1 refills | Status: DC | PRN

## 2021-08-15 MED ORDER — WARFARIN SODIUM 5 MG PO TABS: 10.0000 mg | ORAL_TABLET | ORAL | Status: DC

## 2021-08-15 MED ORDER — WARFARIN SODIUM 7.5 MG PO TABS
7.5000 mg | ORAL_TABLET | ORAL | Status: DC
  Filled 2021-08-15: qty 1

## 2021-08-15 MED ORDER — HYDROCOD POLI-CHLORPHE POLI ER 10-8 MG/5ML PO SUER: 5.0000 mL | Freq: Two times a day (BID) | ORAL | 0 refills | Status: DC | PRN

## 2021-08-15 MED ORDER — GUAIFENESIN ER 600 MG PO TB12: 600.0000 mg | ORAL_TABLET | Freq: Two times a day (BID) | ORAL | 0 refills | Status: AC

## 2021-08-15 MED ORDER — PREDNISONE 20 MG PO TABS: 40.0000 mg | ORAL_TABLET | Freq: Every day | ORAL | 0 refills | Status: DC

## 2021-08-15 NOTE — Progress Notes (Signed)
PT Cancellation Note ? ?Patient Details ?Name: Teresa Frey ?MRN: [id number] ?DOB: Apr 13, 1930 ? ? ?Cancelled Treatment:    Reason Eval/Treat Not Completed: Other (comment) (Pt. d/c today and feels she is at baseline level of functioning.)- Pt. D/c today and reports she is at baseline level of functioning. No further PT needed.  ?Thermon Leyland, SPT ?Acute Rehab Services ? ? ? ?Thermon Leyland ?08/15/2021, 11:06 AM ?

## 2021-08-15 NOTE — Discharge Summary (Signed)
Physician Discharge Summary  ?Teresa Frey QQP:[id number] DOB: [birth year] DOA: 08/11/2021 ? ?PCP: [name], [name], MD ? ?Admit date: 08/11/2021 ?Discharge date: 08/15/2021 ? ?Admitted From: Home ?Disposition:  Home ? ?Discharge Condition:Stable ?CODE STATUS:DNR ?Diet recommendation:  Regular  ?  ? ?Brief/Interim Summary: ? ?Patient is a 88 year old female with history of remote breast cancer, paroxysmal A-fib on Coumadin, hypertension who presented with shortness of breath.  She reported 2 weeks history of getting cold, coughing, bringing up phlegm.  She reported fever at home.  At urgent care, she was hypoxic with saturation in the range of 70s on room air.  She was found to be febrile, had leukocytosis.  Chest x-ray showed possible bronchitis.  Patient was admitted for the management of COPD exacerbation with concurrent possible pneumonia.  Hospital course remarkable for slow improvement in the respiratory status, but has been weaned to room air now.  Possible plan for discharge home today with home health. ? ?Following problems were addressed during her hospitalization: ? ?COPD with acute exacerbation ([place]) ?-With possible concurrent pneumonia ?-Patient's shortness of breath and productive cough are most likely caused by acute COPD exacerbation.  ?-She has history of COPD but has never required O2; she was in the 70s at [facility] and 89% here  ?-Chest x-ray suspicious for pneumonia ?-Started on  ceftriaxone ,azithromycin.  Completed 5 days course ?-She has been weaned to room air ?-Continue prednisone, Mucinex, Tussionex at home.  Nebulizer machine also ordered  ?-She takes albuterol inhaler at home.  We recommend to follow-up with pulmonology for outpatient pulmonary function test. ? ?leukocytosis ?  She is afebrile, likely associated with the steroids.  Improved now ? ?Physical debility ?PT recommending home health on discharge ?  ?DNR (do not resuscitate) ?Code status is DNR ?  ?A-fib ([place]) ?-Paroxysmal A-fib.  Currently  in normal sinus rhythm ?-Rate controlled with metoprolol ?-Continue Coumadin ?  ?Essential hypertension ?-Continue Lopressor, HCTZ ?-Currently normotensive ?  ?Anxiety disorder, unspecified ?-Continue Zoloft ?  ? ? ?Discharge Diagnoses:  ?Principal Problem: ?  COPD with acute exacerbation ([place]) ?Active Problems: ?  Anxiety disorder, unspecified ?  Essential hypertension ?  A-fib ([place]) ?  DNR (do not resuscitate) ?  Physical debility ?  COPD exacerbation ([place]) ?  Leukocytosis ? ? ? ?Discharge Instructions ? ?Discharge Instructions   ? ? Diet general   Complete by: As directed ?  ? Discharge instructions   Complete by: As directed ?  ? 1)Please take prescribed medications as instructed ?2)Follow up with your PCP in a week ?3)Follow up with pulmonology as an outpatient for pulmonary function test.  ? Increase activity slowly   Complete by: As directed ?  ? ?  ? ?Allergies as of 08/15/2021   ? ?   Reactions  ? Hydrocodone-acetaminophen Other (See Comments)  ? UNK reaction  ? Tramadol   ? Loss of control  ? ?  ? ?  ?Medication List  ?  ? ?TAKE these medications   ? ?acetaminophen 500 MG tablet ?Commonly known as: TYLENOL ?Take 500 mg by mouth daily. ?  ?albuterol 108 (90 Base) MCG/ACT inhaler ?Commonly known as: VENTOLIN HFA ?Inhale 2 puffs into the lungs every 6 (six) hours as needed for wheezing or shortness of breath. ?  ?BIOTIN 5000 PO ?Take 5,000 mcg by mouth daily. ?  ?Centrum Silver Adult 50+ Tabs ?Take 1 tablet by mouth daily. ?  ?chlorpheniramine-HYDROcodone 10-8 MG/5ML ?Take 5 mLs by mouth every 12 (twelve) hours as needed for  up to 7 days for cough. ?  ?cholecalciferol 25 MCG (1000 UNIT) tablet ?Commonly known as: VITAMIN D3 ?Take 1,000 Units by mouth daily. ?  ?guaiFENesin 600 MG 12 hr tablet ?Commonly known as: [place] ?Take 1 tablet (600 mg total) by mouth 2 (two) times daily for 5 days. ?  ?hydrochlorothiazide 12.5 MG capsule ?Commonly known as: MICROZIDE ?TAKE 1 CAPSULE BY MOUTH DAILY. ?   ?ipratropium-albuterol 0.5-2.5 (3) MG/3ML Soln ?Commonly known as: DUONEB ?Take 3 mLs by nebulization every 6 (six) hours as needed. ?  ?metoprolol tartrate 25 MG tablet ?Commonly known as: LOPRESSOR ?TAKE 1 TABLET BY MOUTH DAILY. ?  ?predniSONE 20 MG tablet ?Commonly known as: DELTASONE ?Take 2 tablets (40 mg total) by mouth daily for 5 days. ?Start taking on: August 16, 2021 ?  ?sertraline 50 MG tablet ?Commonly known as: ZOLOFT ?Take 1 tablet (50 mg total) by mouth daily. ?  ?Systane Balance 0.6 % Soln ?Generic drug: Propylene Glycol ?Place 1 drop into both eyes daily as needed (dry eyes). ?  ?vitamin B-12 1000 MCG tablet ?Commonly known as: CYANOCOBALAMIN ?Take 1,000 mcg by mouth daily. ?  ?vitamin C 1000 MG tablet ?Take 1,000 mg by mouth daily. ?  ?warfarin 5 MG tablet ?Commonly known as: COUMADIN ?Take as directed. If you are unsure how to take this medication, talk to your nurse or doctor. ?Original instructions: TAKE 1 TO 2 TABLETS BY MOUTH DAILY AS DIRECTED BY THE COUMADIN CLINIC ?What changed: See the new instructions. ?  ? ?  ? ?  ?  ? ? ?  ?Durable Medical Equipment  ?(From admission, onward)  ?  ? ? ?  ? ?  Start     Ordered  ? 08/15/21 1006  For home use only DME Nebulizer machine  Once       ?Question Answer Comment  ?Patient needs a nebulizer to treat with the following condition COPD exacerbation ([place])   ?Length of Need Lifetime   ?  ? 08/15/21 1005  ? ?  ?  ? ?  ? ? Follow-up Information   ? ? [facility] [place] Follow up.   ?Specialty: Home Health Services ?Contact information: ?[address] ?[address] ?[place] [address] ?[phone] ? ? ?  ?  ? ? [name], [name], MD. Schedule an appointment as soon as possible for a visit in 1 week(s).   ?Specialty: Family Medicine ?Contact information: ?[address] ?[place] [address] ?[phone] ? ? ?  ?  ? ?  ?  ? ?  ? ?Allergies  ?Allergen Reactions  ? Hydrocodone-Acetaminophen Other (See Comments)  ?  UNK reaction  ? Tramadol   ?  Loss  of control  ? ? ?Consultations: ?None ? ? ?Procedures/Studies: ?DG Chest Portable 1 View ? ?Result Date: 08/11/2021 ?CLINICAL DATA:  Shortness of breath, cough for 2 weeks EXAM: PORTABLE CHEST 1 VIEW COMPARISON:  06/17/2018 FINDINGS: Bilateral mild interstitial thickening. Left basilar atelectasis. No pleural effusion or pneumothorax. Heart and mediastinal contours are unremarkable. No acute osseous abnormality. IMPRESSION: 1. Bilateral interstitial thickening which may reflect mild interstitial edema versus infection. Electronically Signed   By: Kathreen Devoid M.D.   On: 08/11/2021 12:25   ? ? ? ?Subjective: ?Patient seen and examined at the bedside this morning.  Hemodynamically stable for discharge today.  I called the daughter and discussed about the discharge planning. ? ?Discharge Exam: ?Vitals:  ? 08/15/21 0733 08/15/21 0901  ?BP: (!) 162/83   ?Pulse: Marland Kitchen)  59   ?Resp: 17   ?Temp: 97.7 ?F (36.5 ?C)   ?SpO2: 94% 94%  ? ?Vitals:  ? 08/15/21 0245 08/15/21 0535 08/15/21 0733 08/15/21 0901  ?BP:  (!) 136/102 (!) 162/83   ?Pulse:  65 (!) 59   ?Resp:  16 17   ?Temp:  (!) 97.5 ?F (36.4 ?C) 97.7 ?F (36.5 ?C)   ?TempSrc:  Oral Oral   ?SpO2: 95% 93% 94% 94%  ?Weight:      ?Height:      ? ? ?General: Pt is alert, awake, not in acute distress ?Cardiovascular: RRR, S1/S2 +, no rubs, no gallops ?Respiratory: CTA bilaterally, no wheezing, no rhonchi ?Abdominal: Soft, NT, ND, bowel sounds + ?Extremities: no edema, no cyanosis ? ? ? ?The results of significant diagnostics from this hospitalization (including imaging, microbiology, ancillary and laboratory) are listed below for reference.   ? ? ?Microbiology: ?Recent Results (from the past 240 hour(s))  ?Resp Panel by RT-PCR (Flu A&B, Covid) Nasopharyngeal Swab     Status: None  ? Collection Time: 08/11/21 12:08 PM  ? Specimen: Nasopharyngeal Swab; Nasopharyngeal(NP) swabs in vial transport medium  ?Result Value Ref Range Status  ? SARS Coronavirus 2 by RT PCR NEGATIVE NEGATIVE  Final  ?  Comment: (NOTE) ?SARS-CoV-2 target nucleic acids are NOT DETECTED. ? ?The SARS-CoV-2 RNA is generally detectable in upper respiratory ?specimens during the acute phase of infection. The lowest ?concentration of

## 2021-08-15 NOTE — Care Management Important Message (Signed)
Important Message ? ?Patient Details  ?Name: Teresa Frey ?MRN: [id number] ?Date of Birth: 05/11/1930 ? ? ?Medicare Important Message Given:  Yes ? ? ? ? ?Levada Dy  Emalea Mix-Martin ?08/15/2021, 11:47 AM ?

## 2021-08-16 ENCOUNTER — Telehealth: Payer: Self-pay

## 2021-08-16 LAB — CULTURE, BLOOD (ROUTINE X 2)
Culture: NO GROWTH
Culture: NO GROWTH
Special Requests: ADEQUATE

## 2021-08-16 NOTE — Telephone Encounter (Signed)
Transition Care Management Follow-up Telephone Call ?Date of discharge and from where: [facility] 08-15-21 Dx: COPD exac ?How have you been since you were released from the hospital? Doing good  ?Any questions or concerns? No ? ?Items Reviewed: ?Did the pt receive and understand the discharge instructions provided? Yes  ?Medications obtained and verified? Yes  ?Other? No  ?Any new allergies since your discharge? No  ?Dietary orders reviewed? Yes ?Do you have support at home? Yes  ? ?Home Care and Equipment/Supplies: ?Were home health services ordered? yes ?If so, what is the name of the agency? [facility]   ?Has the agency set up a time to come to the patient's home? no ?Were any new equipment or medical supplies ordered?  Yes- nebulizer  ?What is the name of the medical supply agency? na ?Were you able to get the supplies/equipment? no ?Do you have any questions related to the use of the equipment or supplies? No ? ?Functional Questionnaire: (I = Independent and D = Dependent) ?ADLs: I ? ?Bathing/Dressing- I ? ?Meal Prep- I ? ?Eating- I ? ?Maintaining continence- I ? ?Transferring/Ambulation- I ? ?Managing Meds- I ? ?Follow up appointments reviewed: ? ?PCP Hospital f/u appt confirmed? Yes  Scheduled to see Dr [name] on 08-22-21 @ 1130am. ?[facility] f/u appt confirmed? No  . ?Are transportation arrangements needed? No  ?If their condition worsens, is the pt aware to call PCP or go to the Emergency Dept.? Yes ?Was the patient provided with contact information for the PCP's office or ED? Yes ?Was to pt encouraged to call back with questions or concerns? Yes  ?

## 2021-08-21 ENCOUNTER — Other Ambulatory Visit: Payer: Self-pay

## 2021-08-21 ENCOUNTER — Ambulatory Visit (INDEPENDENT_AMBULATORY_CARE_PROVIDER_SITE_OTHER): Payer: Medicare Other

## 2021-08-21 DIAGNOSIS — I4891 Unspecified atrial fibrillation: Secondary | ICD-10-CM | POA: Diagnosis not present

## 2021-08-21 DIAGNOSIS — Z7901 Long term (current) use of anticoagulants: Secondary | ICD-10-CM | POA: Diagnosis not present

## 2021-08-21 LAB — POCT INR: INR: 2 (ref 2.0–3.0)

## 2021-08-21 NOTE — Patient Instructions (Signed)
Continue 1.5 tablets Daily, except 2 tablets  each Monday and Friday.  Repeat INR in 6 weeks;  ?

## 2021-08-21 NOTE — Progress Notes (Signed)
? ? ?HPI: ? ?[name] is a 88 y.o. female, who is here today to follow on recent hospitalization. ?She was hospitalized from 08/11/21 to 08/15/21. ?TCM call on 08/16/21. ? ?She presented to the ED c/o SOB and fever, found to have pulse O2 at RA in the 70's and febrile during initial evaluation. ? ?COPD exacerbation with possible pneumonia. ?Chest X ray: Bilateral interstitial thickening which may reflect mild interstitial edema versus infection. ? ?She was started on Ceftriaxone and azithromycin, completed 5 days course.  ?She was discharged on Prednisone, which she has completed. ?Still feeling fatigue. ?Negative for fever or chills. ?She was supposed to have Albuterol neb treatments at home but did not receive the nebulizer machine. She is using Albuterol inh bid with no symptoms, mainly for prevention. ?Component ?    Latest Ref Rng 08/14/2021  ?WBC ?    4.0 - 10.5 K/uL 13.9 (H)   ?NEUT# ?    1.4 - 7.7 K/uL 10.5 (H)   ?Hemoglobin ?    12.0 - 15.0 g/dL 14.2   ?HCT ?    36.0 - 46.0 % 43.2   ?Platelets ?    150.0 - 400.0 K/uL 214   ?MCV ?    78.0 - 100.0 fl 92.3   ?MCH ?    26.0 - 34.0 pg 30.3   ?MCHC ?    30.0 - 36.0 g/dL 32.9   ?RBC ?    3.87 - 5.11 Mil/uL 4.68   ?RDW ?    11.5 - 15.5 % 13.8   ?lymph# ?    0.9 - 3.3 10e3/uL   ?MONO# ?    0.1 - 0.9 10e3/uL   ?Eosinophils Absolute ?    0.0 - 0.7 K/uL 0.0   ?Basophils Absolute ?    0.0 - 0.1 K/uL 0.0   ?NEUT% ?    38.4 - 76.8 %   ?LYMPH% ?    14.0 - 49.7 %   ?MONO% ?    0.0 - 14.0 %   ?EOS% ?    0.0 - 7.0 %   ?BASO% ?    0.0 - 2.0 %   ?Neutrophils ?    43.0 - 77.0 % 76   ?Lymphocytes ?    12.0 - 46.0 % 16   ?Monocytes Relative ?    3.0 - 12.0 % 7   ?Eosinophil ?    0.0 - 5.0 % 0   ?Basophil ?    0.0 - 3.0 % 0   ?Lymphocyte # ?    0.7 - 4.0 K/uL 2.2   ?Monocyte # ?    0.1 - 1.0 K/uL 1.0   ?nRBC ?    0.0 - 0.2 % 0.0   ?Immature Granulocytes ?    % 1   ?Abs Immature Granulocytes ?    0.00 - 0.07 K/uL 0.11 (H)   ?Sodium ?    135 - 145 mmol/L   ?Potassium ?    3.5 -  5.1 mmol/L   ?Glucose ?    70 - 99 mg/dL   ? ?PT evaluation scheduled for today. ?She is using a cane. ?She is feeling much better, still having some cough but closer to her baseline. ?Negative for CP,wheezing,or SOB. ?She is not on supplemental O2. ? ?Lab Results  ?Component Value Date  ? CREATININE 0.65 08/13/2021  ? BUN 22 08/13/2021  ? NA 141 08/13/2021  ? K 4.4 08/13/2021  ? CL 108 08/13/2021  ? CO2 26  08/13/2021  ? ?Glucose elevated during hospitalization at 140-150's. ?No hx of diabetes. ?Negative for polydipsia,polyuria, or polyphagia. ? ?Atrial fib: INR was checked yesterday and she is reporting that it was at goal. ?HTN: She is on Metoprolol tartrate 25 mg daily and HCTZ 12.5 mg daily. ? ?Hx of right breast cancer, 01/2016, s/p right breast lumpectomy, completed 5 years of Anastrozole 02/2021. She follows with oncologist, next appt 09/2021. ? ?Review of Systems  ?Constitutional:  Positive for activity change and fatigue. Negative for appetite change.  ?HENT:  Negative for mouth sores, nosebleeds and sore throat.   ?Eyes:  Negative for redness and visual disturbance.  ?Cardiovascular:  Negative for palpitations and leg swelling.  ?Gastrointestinal:  Negative for abdominal pain, nausea and vomiting.  ?     Negative for changes in bowel habits.  ?Genitourinary:  Negative for decreased urine volume, dysuria and hematuria.  ?Neurological:  Negative for syncope, weakness and headaches.  ?Rest see pertinent positives and negatives per HPI. ? ?Current Outpatient Medications on File Prior to Visit  ?Medication Sig Dispense Refill  ? acetaminophen (TYLENOL) 500 MG tablet Take 500 mg by mouth daily.    ? albuterol (VENTOLIN HFA) 108 (90 Base) MCG/ACT inhaler Inhale 2 puffs into the lungs every 6 (six) hours as needed for wheezing or shortness of breath. 18 g 4  ? Ascorbic Acid (VITAMIN C) 1000 MG tablet Take 1,000 mg by mouth daily.    ? BIOTIN 5000 PO Take 5,000 mcg by mouth daily.    ? cholecalciferol (VITAMIN D3) 25  MCG (1000 UNIT) tablet Take 1,000 Units by mouth daily.    ? hydrochlorothiazide (MICROZIDE) 12.5 MG capsule TAKE 1 CAPSULE BY MOUTH DAILY. (Patient taking differently: Take 12.5 mg by mouth daily.) 90 capsule 1  ? ipratropium-albuterol (DUONEB) 0.5-2.5 (3) MG/3ML SOLN Take 3 mLs by nebulization every 6 (six) hours as needed. 360 mL 1  ? metoprolol tartrate (LOPRESSOR) 25 MG tablet TAKE 1 TABLET BY MOUTH DAILY. (Patient taking differently: Take 25 mg by mouth daily.) 90 tablet 4  ? Multiple Vitamins-Minerals (CENTRUM SILVER ADULT 50+) TABS Take 1 tablet by mouth daily.    ? Propylene Glycol (SYSTANE BALANCE) 0.6 % SOLN Place 1 drop into both eyes daily as needed (dry eyes).    ? sertraline (ZOLOFT) 50 MG tablet Take 1 tablet (50 mg total) by mouth daily. 90 tablet 3  ? vitamin B-12 (CYANOCOBALAMIN) 1000 MCG tablet Take 1,000 mcg by mouth daily.    ? warfarin (COUMADIN) 5 MG tablet TAKE 1 TO 2 TABLETS BY MOUTH DAILY AS DIRECTED BY THE COUMADIN CLINIC (Patient taking differently: Take 7.5-10 mg by mouth See admin instructions. Taking 1.2  tablets ( 7.5 mg) on Tues, Wed, Thurs, Sat & Sunday. On Monday & Friday 10 mg) 180 tablet 0  ? ?No current facility-administered medications on file prior to visit.  ? ?Past Medical History:  ?Diagnosis Date  ? ANXIETY 04/07/2007  ? Breast cancer of upper-outer quadrant of right female breast ([place]) 01/11/2016  ? COLONIC POLYPS, HX OF 04/02/2007  ? adenomatous  ? DIVERTICULITIS, HX OF 04/02/2007  ? Diverticulosis   ? DJD (degenerative joint disease)   ? Hemorrhoids   ? HYPERTENSION 04/02/2007  ? OSTEOARTHRITIS, SEVERE 04/07/2007  ? Paget's disease of bone   ? lumbar and sacrum  ? WEIGHT GAIN 11/13/2007  ? ?Allergies  ?Allergen Reactions  ? Hydrocodone-Acetaminophen Other (See Comments)  ?  UNK reaction  ? Tramadol   ?  Loss of  control  ? ? ?Social History  ? ?Socioeconomic History  ? Marital status: Married  ?  Spouse name: Not on file  ? Number of children: 2  ? Years of education: Not on  file  ? Highest education level: Not on file  ?Occupational History  ? Occupation: retired  ?Tobacco Use  ? Smoking status: Former  ?  Packs/day: 1.00  ?  Years: 27.00  ?  Pack years: 27.00  ?  Types: Cigarettes  ?  Quit date: 06/04/1978  ?  Years since quitting: 43.2  ? Smokeless tobacco: Never  ?Vaping Use  ? Vaping Use: Never used  ?Substance and Sexual Activity  ? Alcohol use: Yes  ?  Alcohol/week: 7.0 standard drinks  ?  Types: 7 Glasses of wine per week  ?  Comment: 1-2 glasses wine after dinner, but not necessarily each day  ? Drug use: No  ? Sexual activity: Not on file  ?Other Topics Concern  ? Not on file  ?Social History Narrative  ? Not on file  ? ?Social Determinants of Health  ? ?Financial Resource Strain: Low Risk   ? Difficulty of Paying Living Expenses: Not hard at all  ?Food Insecurity: No Food Insecurity  ? Worried About Charity fundraiser in the Last Year: Never true  ? Ran Out of Food in the Last Year: Never true  ?Transportation Needs: Not on file  ?Physical Activity: Unknown  ? Days of Exercise per Week: 5 days  ? Minutes of Exercise per Session: Not on file  ?Stress: No Stress Concern Present  ? Feeling of Stress : Not at all  ?Social Connections: Moderately Isolated  ? Frequency of Communication with Friends and Family: More than three times a week  ? Frequency of Social Gatherings with Friends and Family: More than three times a week  ? Attends Religious Services: 1 to 4 times per year  ? Active Member of Clubs or Organizations: No  ? Attends Archivist Meetings: Never  ? Marital Status: Widowed  ? ? ?Vitals:  ? 08/22/21 1114  ?BP: 120/80  ?Pulse: 68  ?Resp: 16  ?SpO2: 91%  ? ?Body mass index is 28.17 kg/m?. ? ?Physical Exam ?Vitals and nursing note reviewed.  ?Constitutional:   ?   General: She is not in acute distress. ?   Appearance: She is well-developed.  ?HENT:  ?   Head: Normocephalic and atraumatic.  ?   Mouth/Throat:  ?   Mouth: Mucous membranes are moist.  ?   Pharynx:  Oropharynx is clear.  ?Eyes:  ?   Conjunctiva/sclera: Conjunctivae normal.  ?Cardiovascular:  ?   Rate and Rhythm: Normal rate and regular rhythm.  ?   Heart sounds: No murmur heard. ?   Comments: DP p

## 2021-08-22 ENCOUNTER — Ambulatory Visit (INDEPENDENT_AMBULATORY_CARE_PROVIDER_SITE_OTHER): Payer: Medicare Other | Admitting: Family Medicine

## 2021-08-22 ENCOUNTER — Encounter: Payer: Self-pay | Admitting: Family Medicine

## 2021-08-22 VITALS — BP 120/80 | HR 68 | Resp 16 | Ht 63.0 in | Wt 159.0 lb

## 2021-08-22 DIAGNOSIS — I10 Essential (primary) hypertension: Secondary | ICD-10-CM

## 2021-08-22 DIAGNOSIS — Z170 Estrogen receptor positive status [ER+]: Secondary | ICD-10-CM | POA: Diagnosis not present

## 2021-08-22 DIAGNOSIS — R739 Hyperglycemia, unspecified: Secondary | ICD-10-CM | POA: Diagnosis not present

## 2021-08-22 DIAGNOSIS — D72829 Elevated white blood cell count, unspecified: Secondary | ICD-10-CM | POA: Diagnosis not present

## 2021-08-22 DIAGNOSIS — J449 Chronic obstructive pulmonary disease, unspecified: Secondary | ICD-10-CM

## 2021-08-22 DIAGNOSIS — C50411 Malignant neoplasm of upper-outer quadrant of right female breast: Secondary | ICD-10-CM

## 2021-08-22 LAB — CBC WITH DIFFERENTIAL/PLATELET
Basophils Absolute: 0 10*3/uL (ref 0.0–0.1)
Basophils Relative: 0.2 % (ref 0.0–3.0)
Eosinophils Absolute: 0.1 10*3/uL (ref 0.0–0.7)
Eosinophils Relative: 0.5 % (ref 0.0–5.0)
HCT: 44.8 % (ref 36.0–46.0)
Hemoglobin: 14.7 g/dL (ref 12.0–15.0)
Lymphocytes Relative: 21.3 % (ref 12.0–46.0)
Lymphs Abs: 2.9 10*3/uL (ref 0.7–4.0)
MCHC: 32.8 g/dL (ref 30.0–36.0)
MCV: 91.4 fl (ref 78.0–100.0)
Monocytes Absolute: 0.9 10*3/uL (ref 0.1–1.0)
Monocytes Relative: 6.8 % (ref 3.0–12.0)
Neutro Abs: 9.6 10*3/uL — ABNORMAL HIGH (ref 1.4–7.7)
Neutrophils Relative %: 71.2 % (ref 43.0–77.0)
Platelets: 276 10*3/uL (ref 150.0–400.0)
RBC: 4.9 Mil/uL (ref 3.87–5.11)
RDW: 14.5 % (ref 11.5–15.5)
WBC: 13.4 10*3/uL — ABNORMAL HIGH (ref 4.0–10.5)

## 2021-08-22 LAB — HEMOGLOBIN A1C: Hgb A1c MFr Bld: 6 % (ref 4.6–6.5)

## 2021-08-22 NOTE — Patient Instructions (Addendum)
A few things to remember from today's visit: ? ?Chronic obstructive pulmonary disease, unspecified COPD type ([place]) - Plan: Pulmonary Function Test ? ?Leukocytosis, unspecified type - Plan: CBC with Differential/Platelet, CBC with Differential/Platelet ? ?Hyperglycemia - Plan: Hemoglobin A1c, Hemoglobin A1c ? ?If you need refills please call your pharmacy. ?Do not use My Chart to request refills or for acute issues that need immediate attention. ?  ?Continue Albuterol inh every 4-6 hours as needed. ?If you still need Albuterol often , we can add another inhaler to use daily. ?Pulmonary function test will be arranged. ?Measure oxygen saturation at home. ? ?Please be sure medication list is accurate. ?If a new problem present, please set up appointment sooner than planned today. ? ? ? ? ? ? ? ?

## 2021-08-23 ENCOUNTER — Telehealth: Payer: Self-pay | Admitting: Family Medicine

## 2021-08-23 DIAGNOSIS — I10 Essential (primary) hypertension: Secondary | ICD-10-CM | POA: Diagnosis not present

## 2021-08-23 DIAGNOSIS — J9621 Acute and chronic respiratory failure with hypoxia: Secondary | ICD-10-CM | POA: Diagnosis not present

## 2021-08-23 DIAGNOSIS — M881 Osteitis deformans of vertebrae: Secondary | ICD-10-CM | POA: Diagnosis not present

## 2021-08-23 DIAGNOSIS — Z9181 History of falling: Secondary | ICD-10-CM | POA: Diagnosis not present

## 2021-08-23 DIAGNOSIS — K5790 Diverticulosis of intestine, part unspecified, without perforation or abscess without bleeding: Secondary | ICD-10-CM | POA: Diagnosis not present

## 2021-08-23 DIAGNOSIS — Z87891 Personal history of nicotine dependence: Secondary | ICD-10-CM | POA: Diagnosis not present

## 2021-08-23 DIAGNOSIS — M1990 Unspecified osteoarthritis, unspecified site: Secondary | ICD-10-CM | POA: Diagnosis not present

## 2021-08-23 DIAGNOSIS — Z7901 Long term (current) use of anticoagulants: Secondary | ICD-10-CM | POA: Diagnosis not present

## 2021-08-23 DIAGNOSIS — Z96641 Presence of right artificial hip joint: Secondary | ICD-10-CM | POA: Diagnosis not present

## 2021-08-23 DIAGNOSIS — D72829 Elevated white blood cell count, unspecified: Secondary | ICD-10-CM | POA: Diagnosis not present

## 2021-08-23 DIAGNOSIS — E785 Hyperlipidemia, unspecified: Secondary | ICD-10-CM | POA: Diagnosis not present

## 2021-08-23 DIAGNOSIS — K649 Unspecified hemorrhoids: Secondary | ICD-10-CM | POA: Diagnosis not present

## 2021-08-23 DIAGNOSIS — Z853 Personal history of malignant neoplasm of breast: Secondary | ICD-10-CM | POA: Diagnosis not present

## 2021-08-23 DIAGNOSIS — J441 Chronic obstructive pulmonary disease with (acute) exacerbation: Secondary | ICD-10-CM | POA: Diagnosis not present

## 2021-08-23 DIAGNOSIS — I480 Paroxysmal atrial fibrillation: Secondary | ICD-10-CM | POA: Diagnosis not present

## 2021-08-23 NOTE — Telephone Encounter (Signed)
Anda Kraft with [facility] called in to get verbal orders for patient. The verbal orders are home health PT for 1 week for 5 week and once every other week for 4 weeks. Anda Kraft stated that hospital never sent in the nebulizer when patient was released and the patient needs it. ? ?Anda Kraft could be contacted at [phone]. ? ?Please advise. ?

## 2021-08-23 NOTE — Telephone Encounter (Signed)
Okay to order nebulizer?  

## 2021-08-24 ENCOUNTER — Encounter: Payer: Self-pay | Admitting: Family Medicine

## 2021-08-24 DIAGNOSIS — Z86007 Personal history of in-situ neoplasm of skin: Secondary | ICD-10-CM | POA: Diagnosis not present

## 2021-08-24 DIAGNOSIS — L821 Other seborrheic keratosis: Secondary | ICD-10-CM | POA: Diagnosis not present

## 2021-08-24 DIAGNOSIS — Z85828 Personal history of other malignant neoplasm of skin: Secondary | ICD-10-CM | POA: Diagnosis not present

## 2021-08-24 DIAGNOSIS — Z08 Encounter for follow-up examination after completed treatment for malignant neoplasm: Secondary | ICD-10-CM | POA: Diagnosis not present

## 2021-08-24 DIAGNOSIS — D485 Neoplasm of uncertain behavior of skin: Secondary | ICD-10-CM | POA: Diagnosis not present

## 2021-08-25 NOTE — Telephone Encounter (Signed)
Spoke with Anda Kraft, first informed her of VO and ordering nebulizer. Spoke with Dr [name] and she does not want pt using nebulizer. Called Anda Kraft and left VM that nebulizer will not be ordered per Dr [name], as she wants pt to use inhaler as needed.  ?

## 2021-08-25 NOTE — Telephone Encounter (Signed)
I recommend continuing with Albuterol inh, which she has been using. ?I ordered PFT's. ?We can also add another inhaler to use daily to better control symptoms. ?Thanks, ?BJ ?

## 2021-08-29 DIAGNOSIS — Z9181 History of falling: Secondary | ICD-10-CM | POA: Diagnosis not present

## 2021-08-29 DIAGNOSIS — M881 Osteitis deformans of vertebrae: Secondary | ICD-10-CM | POA: Diagnosis not present

## 2021-08-29 DIAGNOSIS — M1990 Unspecified osteoarthritis, unspecified site: Secondary | ICD-10-CM | POA: Diagnosis not present

## 2021-08-29 DIAGNOSIS — E785 Hyperlipidemia, unspecified: Secondary | ICD-10-CM | POA: Diagnosis not present

## 2021-08-29 DIAGNOSIS — J441 Chronic obstructive pulmonary disease with (acute) exacerbation: Secondary | ICD-10-CM | POA: Diagnosis not present

## 2021-08-29 DIAGNOSIS — I480 Paroxysmal atrial fibrillation: Secondary | ICD-10-CM | POA: Diagnosis not present

## 2021-08-29 DIAGNOSIS — Z96641 Presence of right artificial hip joint: Secondary | ICD-10-CM | POA: Diagnosis not present

## 2021-08-29 DIAGNOSIS — I10 Essential (primary) hypertension: Secondary | ICD-10-CM | POA: Diagnosis not present

## 2021-08-29 DIAGNOSIS — D72829 Elevated white blood cell count, unspecified: Secondary | ICD-10-CM | POA: Diagnosis not present

## 2021-08-29 DIAGNOSIS — J9621 Acute and chronic respiratory failure with hypoxia: Secondary | ICD-10-CM | POA: Diagnosis not present

## 2021-08-29 DIAGNOSIS — Z7901 Long term (current) use of anticoagulants: Secondary | ICD-10-CM | POA: Diagnosis not present

## 2021-08-29 DIAGNOSIS — K649 Unspecified hemorrhoids: Secondary | ICD-10-CM | POA: Diagnosis not present

## 2021-08-29 DIAGNOSIS — K5790 Diverticulosis of intestine, part unspecified, without perforation or abscess without bleeding: Secondary | ICD-10-CM | POA: Diagnosis not present

## 2021-08-29 DIAGNOSIS — Z87891 Personal history of nicotine dependence: Secondary | ICD-10-CM | POA: Diagnosis not present

## 2021-08-29 DIAGNOSIS — Z853 Personal history of malignant neoplasm of breast: Secondary | ICD-10-CM | POA: Diagnosis not present

## 2021-09-04 ENCOUNTER — Telehealth: Payer: Self-pay | Admitting: Hematology

## 2021-09-04 NOTE — Telephone Encounter (Signed)
Spoke with pt about reschedule.  Pt is aware and confirmed appointment  ?

## 2021-09-06 DIAGNOSIS — E785 Hyperlipidemia, unspecified: Secondary | ICD-10-CM | POA: Diagnosis not present

## 2021-09-06 DIAGNOSIS — Z9181 History of falling: Secondary | ICD-10-CM | POA: Diagnosis not present

## 2021-09-06 DIAGNOSIS — Z853 Personal history of malignant neoplasm of breast: Secondary | ICD-10-CM | POA: Diagnosis not present

## 2021-09-06 DIAGNOSIS — Z96641 Presence of right artificial hip joint: Secondary | ICD-10-CM | POA: Diagnosis not present

## 2021-09-06 DIAGNOSIS — J9621 Acute and chronic respiratory failure with hypoxia: Secondary | ICD-10-CM | POA: Diagnosis not present

## 2021-09-06 DIAGNOSIS — J441 Chronic obstructive pulmonary disease with (acute) exacerbation: Secondary | ICD-10-CM | POA: Diagnosis not present

## 2021-09-06 DIAGNOSIS — M881 Osteitis deformans of vertebrae: Secondary | ICD-10-CM | POA: Diagnosis not present

## 2021-09-06 DIAGNOSIS — Z87891 Personal history of nicotine dependence: Secondary | ICD-10-CM | POA: Diagnosis not present

## 2021-09-06 DIAGNOSIS — I480 Paroxysmal atrial fibrillation: Secondary | ICD-10-CM | POA: Diagnosis not present

## 2021-09-06 DIAGNOSIS — M1990 Unspecified osteoarthritis, unspecified site: Secondary | ICD-10-CM | POA: Diagnosis not present

## 2021-09-06 DIAGNOSIS — K5790 Diverticulosis of intestine, part unspecified, without perforation or abscess without bleeding: Secondary | ICD-10-CM | POA: Diagnosis not present

## 2021-09-06 DIAGNOSIS — I10 Essential (primary) hypertension: Secondary | ICD-10-CM | POA: Diagnosis not present

## 2021-09-06 DIAGNOSIS — K649 Unspecified hemorrhoids: Secondary | ICD-10-CM | POA: Diagnosis not present

## 2021-09-06 DIAGNOSIS — D72829 Elevated white blood cell count, unspecified: Secondary | ICD-10-CM | POA: Diagnosis not present

## 2021-09-06 DIAGNOSIS — Z7901 Long term (current) use of anticoagulants: Secondary | ICD-10-CM | POA: Diagnosis not present

## 2021-09-11 ENCOUNTER — Telehealth: Payer: Self-pay | Admitting: Family Medicine

## 2021-09-11 DIAGNOSIS — J449 Chronic obstructive pulmonary disease, unspecified: Secondary | ICD-10-CM

## 2021-09-11 MED ORDER — ALBUTEROL SULFATE HFA 108 (90 BASE) MCG/ACT IN AERS: 2.0000 | INHALATION_SPRAY | Freq: Four times a day (QID) | RESPIRATORY_TRACT | 4 refills | Status: DC | PRN

## 2021-09-11 NOTE — Telephone Encounter (Signed)
Rx sent in as requested. 

## 2021-09-11 NOTE — Telephone Encounter (Signed)
Pt call and need a refill on her albuterol (VENTOLIN HFA) 108 (90 Base) MCG/ACT inhaler sent to  ?[place] [place] Phone:  [phone]  ?Fax:  [phone]  ?  ? ?

## 2021-09-12 ENCOUNTER — Other Ambulatory Visit: Payer: Self-pay | Admitting: Family Medicine

## 2021-09-13 DIAGNOSIS — I480 Paroxysmal atrial fibrillation: Secondary | ICD-10-CM | POA: Diagnosis not present

## 2021-09-13 DIAGNOSIS — I10 Essential (primary) hypertension: Secondary | ICD-10-CM | POA: Diagnosis not present

## 2021-09-13 DIAGNOSIS — K649 Unspecified hemorrhoids: Secondary | ICD-10-CM | POA: Diagnosis not present

## 2021-09-13 DIAGNOSIS — Z87891 Personal history of nicotine dependence: Secondary | ICD-10-CM | POA: Diagnosis not present

## 2021-09-13 DIAGNOSIS — Z96641 Presence of right artificial hip joint: Secondary | ICD-10-CM | POA: Diagnosis not present

## 2021-09-13 DIAGNOSIS — E785 Hyperlipidemia, unspecified: Secondary | ICD-10-CM | POA: Diagnosis not present

## 2021-09-13 DIAGNOSIS — D72829 Elevated white blood cell count, unspecified: Secondary | ICD-10-CM | POA: Diagnosis not present

## 2021-09-13 DIAGNOSIS — M881 Osteitis deformans of vertebrae: Secondary | ICD-10-CM | POA: Diagnosis not present

## 2021-09-13 DIAGNOSIS — M1990 Unspecified osteoarthritis, unspecified site: Secondary | ICD-10-CM | POA: Diagnosis not present

## 2021-09-13 DIAGNOSIS — Z9181 History of falling: Secondary | ICD-10-CM | POA: Diagnosis not present

## 2021-09-13 DIAGNOSIS — K5790 Diverticulosis of intestine, part unspecified, without perforation or abscess without bleeding: Secondary | ICD-10-CM | POA: Diagnosis not present

## 2021-09-13 DIAGNOSIS — J9621 Acute and chronic respiratory failure with hypoxia: Secondary | ICD-10-CM | POA: Diagnosis not present

## 2021-09-13 DIAGNOSIS — Z7901 Long term (current) use of anticoagulants: Secondary | ICD-10-CM | POA: Diagnosis not present

## 2021-09-13 DIAGNOSIS — Z853 Personal history of malignant neoplasm of breast: Secondary | ICD-10-CM | POA: Diagnosis not present

## 2021-09-13 DIAGNOSIS — J441 Chronic obstructive pulmonary disease with (acute) exacerbation: Secondary | ICD-10-CM | POA: Diagnosis not present

## 2021-09-20 ENCOUNTER — Inpatient Hospital Stay: Payer: Medicare Other

## 2021-09-20 ENCOUNTER — Inpatient Hospital Stay: Payer: Medicare Other | Admitting: Hematology

## 2021-09-21 DIAGNOSIS — Z853 Personal history of malignant neoplasm of breast: Secondary | ICD-10-CM | POA: Diagnosis not present

## 2021-09-21 DIAGNOSIS — E785 Hyperlipidemia, unspecified: Secondary | ICD-10-CM | POA: Diagnosis not present

## 2021-09-21 DIAGNOSIS — K649 Unspecified hemorrhoids: Secondary | ICD-10-CM | POA: Diagnosis not present

## 2021-09-21 DIAGNOSIS — Z9181 History of falling: Secondary | ICD-10-CM | POA: Diagnosis not present

## 2021-09-21 DIAGNOSIS — M1990 Unspecified osteoarthritis, unspecified site: Secondary | ICD-10-CM | POA: Diagnosis not present

## 2021-09-21 DIAGNOSIS — I480 Paroxysmal atrial fibrillation: Secondary | ICD-10-CM | POA: Diagnosis not present

## 2021-09-21 DIAGNOSIS — D72829 Elevated white blood cell count, unspecified: Secondary | ICD-10-CM | POA: Diagnosis not present

## 2021-09-21 DIAGNOSIS — Z7901 Long term (current) use of anticoagulants: Secondary | ICD-10-CM | POA: Diagnosis not present

## 2021-09-21 DIAGNOSIS — K5790 Diverticulosis of intestine, part unspecified, without perforation or abscess without bleeding: Secondary | ICD-10-CM | POA: Diagnosis not present

## 2021-09-21 DIAGNOSIS — M881 Osteitis deformans of vertebrae: Secondary | ICD-10-CM | POA: Diagnosis not present

## 2021-09-21 DIAGNOSIS — I10 Essential (primary) hypertension: Secondary | ICD-10-CM | POA: Diagnosis not present

## 2021-09-21 DIAGNOSIS — J441 Chronic obstructive pulmonary disease with (acute) exacerbation: Secondary | ICD-10-CM | POA: Diagnosis not present

## 2021-09-21 DIAGNOSIS — Z96641 Presence of right artificial hip joint: Secondary | ICD-10-CM | POA: Diagnosis not present

## 2021-09-21 DIAGNOSIS — J9621 Acute and chronic respiratory failure with hypoxia: Secondary | ICD-10-CM | POA: Diagnosis not present

## 2021-09-21 DIAGNOSIS — Z87891 Personal history of nicotine dependence: Secondary | ICD-10-CM | POA: Diagnosis not present

## 2021-09-25 ENCOUNTER — Other Ambulatory Visit: Payer: Self-pay | Admitting: Cardiology

## 2021-10-02 ENCOUNTER — Ambulatory Visit (INDEPENDENT_AMBULATORY_CARE_PROVIDER_SITE_OTHER): Payer: Medicare Other

## 2021-10-02 DIAGNOSIS — I4891 Unspecified atrial fibrillation: Secondary | ICD-10-CM | POA: Diagnosis not present

## 2021-10-02 DIAGNOSIS — Z7901 Long term (current) use of anticoagulants: Secondary | ICD-10-CM | POA: Diagnosis not present

## 2021-10-02 LAB — POCT INR: INR: 2.3 (ref 2.0–3.0)

## 2021-10-02 NOTE — Patient Instructions (Signed)
Continue 1.5 tablets Daily, except 2 tablets  each Monday and Friday.  Repeat INR in 6 weeks;  ?

## 2021-10-06 ENCOUNTER — Other Ambulatory Visit: Payer: Self-pay

## 2021-10-06 ENCOUNTER — Inpatient Hospital Stay: Payer: Medicare Other | Attending: Hematology

## 2021-10-06 ENCOUNTER — Inpatient Hospital Stay: Payer: Medicare Other | Admitting: Hematology

## 2021-10-06 VITALS — BP 153/70 | HR 61 | Temp 98.0°F | Resp 18 | Wt 163.5 lb

## 2021-10-06 DIAGNOSIS — J9621 Acute and chronic respiratory failure with hypoxia: Secondary | ICD-10-CM | POA: Diagnosis not present

## 2021-10-06 DIAGNOSIS — Z79811 Long term (current) use of aromatase inhibitors: Secondary | ICD-10-CM | POA: Insufficient documentation

## 2021-10-06 DIAGNOSIS — K5790 Diverticulosis of intestine, part unspecified, without perforation or abscess without bleeding: Secondary | ICD-10-CM | POA: Diagnosis not present

## 2021-10-06 DIAGNOSIS — J441 Chronic obstructive pulmonary disease with (acute) exacerbation: Secondary | ICD-10-CM | POA: Diagnosis not present

## 2021-10-06 DIAGNOSIS — Z96641 Presence of right artificial hip joint: Secondary | ICD-10-CM | POA: Diagnosis not present

## 2021-10-06 DIAGNOSIS — M881 Osteitis deformans of vertebrae: Secondary | ICD-10-CM | POA: Diagnosis not present

## 2021-10-06 DIAGNOSIS — C50411 Malignant neoplasm of upper-outer quadrant of right female breast: Secondary | ICD-10-CM | POA: Insufficient documentation

## 2021-10-06 DIAGNOSIS — Z79899 Other long term (current) drug therapy: Secondary | ICD-10-CM | POA: Diagnosis not present

## 2021-10-06 DIAGNOSIS — Z87891 Personal history of nicotine dependence: Secondary | ICD-10-CM | POA: Diagnosis not present

## 2021-10-06 DIAGNOSIS — Z1231 Encounter for screening mammogram for malignant neoplasm of breast: Secondary | ICD-10-CM

## 2021-10-06 DIAGNOSIS — I480 Paroxysmal atrial fibrillation: Secondary | ICD-10-CM | POA: Diagnosis not present

## 2021-10-06 DIAGNOSIS — M85852 Other specified disorders of bone density and structure, left thigh: Secondary | ICD-10-CM | POA: Diagnosis not present

## 2021-10-06 DIAGNOSIS — E785 Hyperlipidemia, unspecified: Secondary | ICD-10-CM | POA: Diagnosis not present

## 2021-10-06 DIAGNOSIS — D72829 Elevated white blood cell count, unspecified: Secondary | ICD-10-CM | POA: Diagnosis not present

## 2021-10-06 DIAGNOSIS — I10 Essential (primary) hypertension: Secondary | ICD-10-CM | POA: Diagnosis not present

## 2021-10-06 DIAGNOSIS — Z7901 Long term (current) use of anticoagulants: Secondary | ICD-10-CM | POA: Diagnosis not present

## 2021-10-06 DIAGNOSIS — Z170 Estrogen receptor positive status [ER+]: Secondary | ICD-10-CM | POA: Diagnosis not present

## 2021-10-06 DIAGNOSIS — Z853 Personal history of malignant neoplasm of breast: Secondary | ICD-10-CM | POA: Diagnosis not present

## 2021-10-06 DIAGNOSIS — I4891 Unspecified atrial fibrillation: Secondary | ICD-10-CM | POA: Insufficient documentation

## 2021-10-06 DIAGNOSIS — K649 Unspecified hemorrhoids: Secondary | ICD-10-CM | POA: Diagnosis not present

## 2021-10-06 DIAGNOSIS — M1990 Unspecified osteoarthritis, unspecified site: Secondary | ICD-10-CM | POA: Diagnosis not present

## 2021-10-06 DIAGNOSIS — Z9181 History of falling: Secondary | ICD-10-CM | POA: Diagnosis not present

## 2021-10-06 LAB — CMP (CANCER CENTER ONLY)
ALT: 11 U/L (ref 0–44)
AST: 17 U/L (ref 15–41)
Albumin: 4 g/dL (ref 3.5–5.0)
Alkaline Phosphatase: 206 U/L — ABNORMAL HIGH (ref 38–126)
Anion gap: 4 — ABNORMAL LOW (ref 5–15)
BUN: 19 mg/dL (ref 8–23)
CO2: 33 mmol/L — ABNORMAL HIGH (ref 22–32)
Calcium: 10 mg/dL (ref 8.9–10.3)
Chloride: 107 mmol/L (ref 98–111)
Creatinine: 0.78 mg/dL (ref 0.44–1.00)
GFR, Estimated: >60 mL/min (ref >60)
Glucose, Bld: 100 mg/dL — ABNORMAL HIGH (ref 70–99)
Potassium: 4.4 mmol/L (ref 3.5–5.1)
Sodium: 144 mmol/L (ref 135–145)
Total Bilirubin: 0.5 mg/dL (ref 0.3–1.2)
Total Protein: 6.9 g/dL (ref 6.5–8.1)

## 2021-10-06 LAB — CBC WITH DIFFERENTIAL (CANCER CENTER ONLY)
Abs Immature Granulocytes: 0.03 10*3/uL (ref 0.00–0.07)
Basophils Absolute: 0 10*3/uL (ref 0.0–0.1)
Basophils Relative: 1 %
Eosinophils Absolute: 0.1 10*3/uL (ref 0.0–0.5)
Eosinophils Relative: 1 %
HCT: 44.6 % (ref 36.0–46.0)
Hemoglobin: 14.2 g/dL (ref 12.0–15.0)
Immature Granulocytes: 0 %
Lymphocytes Relative: 27 %
Lymphs Abs: 2.4 10*3/uL (ref 0.7–4.0)
MCH: 29.6 pg (ref 26.0–34.0)
MCHC: 31.8 g/dL (ref 30.0–36.0)
MCV: 92.9 fL (ref 80.0–100.0)
Monocytes Absolute: 0.8 10*3/uL (ref 0.1–1.0)
Monocytes Relative: 9 %
Neutro Abs: 5.5 10*3/uL (ref 1.7–7.7)
Neutrophils Relative %: 62 %
Platelet Count: 184 10*3/uL (ref 150–400)
RBC: 4.8 MIL/uL (ref 3.87–5.11)
RDW: 13.5 % (ref 11.5–15.5)
WBC Count: 8.8 10*3/uL (ref 4.0–10.5)
nRBC: 0 % (ref 0.0–0.2)

## 2021-10-06 NOTE — Progress Notes (Signed)
?[place]   ?[phone] [phone]   ?Clinic Follow up Note  ? ?Patient Care Team: ?[name], [name], MD as PCP - General (Family Medicine) ?[name], MD as PCP - Cardiology (Cardiology) ?Fanny Skates, MD as Consulting Physician (General Surgery) ?Truitt Merle, MD as Consulting Physician (Hematology) ?Gery Pray, MD as Consulting Physician (Radiation Oncology) ?Gardenia Phlegm, NP as Nurse Practitioner (Hematology and Oncology) ?Viona Gilmore, [facility] as Pharmacist (Pharmacist) ? ?Date of Service:  10/06/2021 ? ?CHIEF COMPLAINT: f/u of right breast cancer ? ?CURRENT THERAPY:  ?Surveillance ? ?ASSESSMENT & PLAN:  ?Teresa Frey is a 89 y.o. post-menopausal female with  ? ?1. Breast cancer of upper-outer quadrant of right breast, invasive ductal carcinoma, pT1CN0M0, stage IA, ER+/PR+/HER2- ?-diagnosed in 01/2016, s/p right breast lumpectomy.  ?-She completed 5 years of antiestrogen with Anastrozole 02/2016 - 02/2021. ?-most recent mammogram from 03/20/21 was benign. ?-She is clinically doing well. Lab reviewed, WNL. Her physical exam was unremarkable. There is no clinical concern for recurrence. ?-She has completed 5 years of anastrozole and follow up. I feel comfortable releasing her to f/u with her PCP, due to her advanced age; she is agreeable. She will continue surveillance with other doctors, next mammogram due 03/2022. ?  ?2. Genetic Testing was negative ?  ?3. HTN, AFib ?-She was on hydrochlorothiazide for hypertension before, which was held when she had a 2 fibrillation. She is on low-dose metoprolol. ?-Given her Afib episode on 06/28/17, she was placed on Lopressor 12.43m BID and Eliquis 5 mg BID ?  ?4. Arthralgia, Joint Stiffness ?-Chronic left hip pain. Has lower back pain. Follows with orthopedics. She ambulates with cane.   ?-Taking meloxicam. Stable on anastrozole.  ?  ?5. Mild Osteopenia, 2019 left femur fracture, multiple fractures in 04/2019  ?-Had  fall in 04/2018 that resulted in left femur fracture ?-Her 01/22/17 DEXA was normal. 02/23/20 DEXA showed mild osteopenia with lowest T-score -1.1 at left distal radius.  ?-She previously declined Zometa ?-Continue Vit D daily.  ?  ?  ?Plan  ?-m[facility]ogram in [facility]/2022 [facility] ordered today ?-f/u open, she will follow-up with PCP for breast cancer surveillance ? ? ?No problem-specific Assessment & Plan notes found for this encounter. ? ? ?SUMMARY OF ONCOLOGIC HISTORY: ?Oncology History Overview Note  ?Breast cancer of upper-outer quadrant of right female breast [place] [place] ?  Staging form: Breast, AJCC 7th Edition ?  - Clinical stage from 01/18/2016: Stage IA (T1b, N0, M0) - Unsigned ?  - Pathologic stage from 02/08/2016: Stage IA (T1c, N0, cM0) - Signed by YTruitt Merle MD on 02/27/2016 ? ? ?  ?[place]ast [place] of upper-outer quadrant of right female [place] ([facility] [place]  ?01/05/2016 Mammogram  ? Diagnostic mammogram and ultrasound showed a 9 mm mass in the upper outer quadrant of right breast, axillary nodes were negative. ? ?  ?01/09/2016 Initial Diagnosis  ? Breast cancer of upper-[place] quadrant of right female breast [place] ? ?  ?01/09/2016 Initial Biopsy  ? R breast 9:00 position biopsy showed invasive ductal carcinoma, G1-2 ? ?  ?01/09/2016 Receptors her2  ? ER 90%+, PR 40%+, HER2-, Ki67 5% ? ?  ?02/08/2016 Surgery  ? Right breast lumpectomy, no SLN biopsy  ? ? ?  ?02/08/2016 Pathology Results  ? Right breast lumpectomy showed invasive and in situ ductal carcinoma, 1.1 cm, margins were negative. Grade 1, no lymphovascular invasion. Atypical lobular hyperplasia. ? ?  ?02/21/2016 Genetic Testing  ? Genetic testing did not reveal a known  deleterious mutation.  Genetic testing did identify a variant of uncertain significance (VUS) called "c.1659G>A (p.Met553Ile)" in one copy of the NBN gene.  Genes tested include: APC, ATM, AXIN2, BARD1, BMPR1A, BRCA1, BRCA2, BRIP1, CDH1, CDK4, CDKN2A, CHEK2, EPCAM, FANCC, MLH1, MSH2, MSH6, MUTYH, NBN, PALB2, PMS2, POLD1,  POLE, PTEN, RAD51C, RAD51D, SCG5/GREM1, SMAD4, STK11, TP53, VHL, and XRCC2. ?  ?02/28/2016 -  Anti-estrogen oral therapy  ? Anastrozole 1 mg daily ? ?  ? ? ? ?INTERVAL HISTORY:  ?Teresa Frey is here for a follow up of breast cancer. She was last seen by me on 12/19/20. She presents to the clinic alone.  ?She reports she is doing well overall. She tells me since her last visit she was hospitalized for COPD. ?She notes she lives alone, but her daughter lives nearby and she has someone living in her basement apartment. She was in a wheelchair today, but she notes she normally walks with a cane (the distance from the entrance to the exam room was long). ?  ?All other systems were reviewed with the patient and are negative. ? ?MEDICAL HISTORY:  ?Past Medical History:  ?Diagnosis Date  ? ANXIETY 04/07/2007  ? Breast cancer of upper-outer quadrant of right female breast ([place]) 01/11/2016  ? COLONIC POLYPS, HX OF 04/02/2007  ? adenomatous  ? DIVERTICULITIS, HX OF 04/02/2007  ? Diverticulosis   ? DJD (degenerative joint disease)   ? Hemorrhoids   ? HYPERTENSION 04/02/2007  ? OSTEOARTHRITIS, SEVERE 04/07/2007  ? Paget's disease of bone   ? lumbar and sacrum  ? WEIGHT GAIN 11/13/2007  ? ? ?SURGICAL HISTORY: ?Past Surgical History:  ?Procedure Laterality Date  ? APPENDECTOMY    ? BREAST LUMPECTOMY WITH RADIOACTIVE SEED LOCALIZATION Right 02/08/2016  ? Procedure: RIGHT BREAST LUMPECTOMY WITH RADIOACTIVE SEED LOCALIZATION;  Surgeon: Fanny Skates, MD;  Location: [facility];  Service: General;  Laterality: Right;  ? ORIF FEMUR FRACTURE Left 04/21/2018  ? Procedure: OPEN REDUCTION INTERNAL FIXATION (ORIF) PERI PROSTHETIC FEMUR FRACTURE;  Surgeon: Leandrew Koyanagi, MD;  Location: [facility];  Service: Orthopedics;  Laterality: Left;  ? TONSILLECTOMY    ? TOTAL HIP ARTHROPLASTY  1999, left hip   ? 2006 right hip  ? TUBAL LIGATION    ? ? ?I have reviewed the social history and family history with the patient and they are unchanged  from previous note. ? ?ALLERGIES:  is allergic to hydrocodone-acetaminophen and tramadol. ? ?MEDICATIONS:  ?Current Outpatient Medications  ?Medication Sig Dispense Refill  ? acetaminophen (TYLENOL) 500 MG tablet Take 500 mg by mouth daily.    ? albuterol (VENTOLIN HFA) 108 (90 Base) MCG/ACT inhaler Inhale 2 puffs into the lungs every 6 (six) hours as needed for wheezing or shortness of breath. 18 g 4  ? Ascorbic Acid (VITAMIN C) 1000 MG tablet Take 1,000 mg by mouth daily.    ? BIOTIN 5000 PO Take 5,000 mcg by mouth daily.    ? cholecalciferol (VITAMIN D3) 25 MCG (1000 UNIT) tablet Take 1,000 Units by mouth daily.    ? hydrochlorothiazide (MICROZIDE) 12.5 MG capsule TAKE 1 CAPSULE BY MOUTH DAILY. 90 capsule 1  ? ipratropium-albuterol (DUONEB) 0.5-2.5 (3) MG/3ML SOLN Take 3 mLs by nebulization every 6 (six) hours as needed. 360 mL 1  ? metoprolol tartrate (LOPRESSOR) 25 MG tablet TAKE 1 TABLET BY MOUTH DAILY. (Patient taking differently: Take 25 mg by mouth daily.) 90 tablet 4  ? Multiple Vitamins-Minerals (CENTRUM SILVER ADULT 50+) TABS Take 1 tablet  by mouth daily.    ? Propylene Glycol (SYSTANE BALANCE) 0.6 % SOLN Place 1 drop into both eyes daily as needed (dry eyes).    ? sertraline (ZOLOFT) 50 MG tablet Take 1 tablet (50 mg total) by mouth daily. 90 tablet 3  ? vitamin B-12 (CYANOCOBALAMIN) 1000 MCG tablet Take 1,000 mcg by mouth daily.    ? warfarin (COUMADIN) 5 MG tablet TAKE 1 TO 2 TABLETS BY MOUTH DAILY AS DIRECTED BY THE COUMADIN CLINIC 180 tablet 0  ? ?No current facility-administered medications for this visit.  ? ? ?PHYSICAL EXAMINATION: ?ECOG PERFORMANCE STATUS: 1 - Symptomatic but completely ambulatory ? ?There were no vitals filed for this visit. ?Wt Readings from Last 3 Encounters:  ?08/22/21 159 lb (72.1 kg)  ?08/14/21 169 lb (76.7 kg)  ?05/10/21 162 lb 12.8 oz (73.8 kg)  ?  ? ?GENERAL:alert, no distress and comfortable ?SKIN: skin color, texture, turgor are normal, no rashes or significant  lesions ?EYES: normal, Conjunctiva are pink and non-injected, sclera clear  ?NECK: supple, thyroid normal size, non-tender, without nodularity ?LYMPH:  no palpable lymphadenopathy in the cervical, axillary ?LUN

## 2021-10-07 ENCOUNTER — Encounter: Payer: Self-pay | Admitting: Hematology

## 2021-10-10 ENCOUNTER — Telehealth: Payer: Self-pay

## 2021-10-10 NOTE — Telephone Encounter (Signed)
Left message on voicemail okay to reschedule ?

## 2021-10-10 NOTE — Telephone Encounter (Signed)
Unsuccessful attempt to reach patient on preferred number listed in notes for scheduled AWV. ?

## 2021-10-18 DIAGNOSIS — J9621 Acute and chronic respiratory failure with hypoxia: Secondary | ICD-10-CM | POA: Diagnosis not present

## 2021-10-18 DIAGNOSIS — E785 Hyperlipidemia, unspecified: Secondary | ICD-10-CM | POA: Diagnosis not present

## 2021-10-18 DIAGNOSIS — K649 Unspecified hemorrhoids: Secondary | ICD-10-CM | POA: Diagnosis not present

## 2021-10-18 DIAGNOSIS — Z96641 Presence of right artificial hip joint: Secondary | ICD-10-CM | POA: Diagnosis not present

## 2021-10-18 DIAGNOSIS — Z87891 Personal history of nicotine dependence: Secondary | ICD-10-CM | POA: Diagnosis not present

## 2021-10-18 DIAGNOSIS — D72829 Elevated white blood cell count, unspecified: Secondary | ICD-10-CM | POA: Diagnosis not present

## 2021-10-18 DIAGNOSIS — I10 Essential (primary) hypertension: Secondary | ICD-10-CM | POA: Diagnosis not present

## 2021-10-18 DIAGNOSIS — K5790 Diverticulosis of intestine, part unspecified, without perforation or abscess without bleeding: Secondary | ICD-10-CM | POA: Diagnosis not present

## 2021-10-18 DIAGNOSIS — M881 Osteitis deformans of vertebrae: Secondary | ICD-10-CM | POA: Diagnosis not present

## 2021-10-18 DIAGNOSIS — J441 Chronic obstructive pulmonary disease with (acute) exacerbation: Secondary | ICD-10-CM | POA: Diagnosis not present

## 2021-10-18 DIAGNOSIS — Z853 Personal history of malignant neoplasm of breast: Secondary | ICD-10-CM | POA: Diagnosis not present

## 2021-10-18 DIAGNOSIS — I480 Paroxysmal atrial fibrillation: Secondary | ICD-10-CM | POA: Diagnosis not present

## 2021-10-18 DIAGNOSIS — Z9181 History of falling: Secondary | ICD-10-CM | POA: Diagnosis not present

## 2021-10-18 DIAGNOSIS — Z7901 Long term (current) use of anticoagulants: Secondary | ICD-10-CM | POA: Diagnosis not present

## 2021-10-18 DIAGNOSIS — M1990 Unspecified osteoarthritis, unspecified site: Secondary | ICD-10-CM | POA: Diagnosis not present

## 2021-10-23 ENCOUNTER — Ambulatory Visit (INDEPENDENT_AMBULATORY_CARE_PROVIDER_SITE_OTHER): Payer: Medicare Other

## 2021-10-23 VITALS — Ht 63.0 in | Wt 163.0 lb

## 2021-10-23 DIAGNOSIS — Z0000 Encounter for general adult medical examination without abnormal findings: Secondary | ICD-10-CM

## 2021-10-23 NOTE — Progress Notes (Signed)
Subjective:   Teresa Frey is a 88 y.o. female who presents for Medicare Annual (Subsequent) preventive examination.  Review of Systems    Virtual Visit via Telephone Note  I connected with  Teresa Frey on 10/23/21 at 11:30 AM EDT by telephone and verified that I am speaking with the correct person using two identifiers.  Location: Patient: Home Provider: Office Persons participating in the virtual visit: patient/Nurse Health Advisor   I discussed the limitations, risks, security and privacy concerns of performing an evaluation and management service by telephone and the availability of in person appointments. The patient expressed understanding and agreed to proceed.  Interactive audio and video telecommunications were attempted between this nurse and patient, however failed, due to patient having technical difficulties OR patient did not have access to video capability.  We continued and completed visit with audio only.  Some vital signs may be absent or patient reported.   Criselda Peaches, LPN  Cardiac Risk Factors include: advanced age (>60mn, >>76women);hypertension     Objective:    Today's Vitals   10/23/21 1131  Weight: 163 lb (73.9 kg)  Height: '5\' 3"'$  (1.6 m)   Body mass index is 28.87 kg/m.     10/23/2021   11:42 AM 08/14/2021    9:00 PM 09/30/2020   11:42 AM 11/13/2019   10:47 AM 04/12/2019    2:58 PM 04/20/2018    6:00 PM 04/19/2018   11:40 PM  Advanced Directives  Does Patient Have a Medical Advance Directive? Yes No Yes Yes Yes Yes Yes  Type of AParamed[place]of [place]woodLiving will  HEl PortalLiving will   Healthcare Power of [place] Does patient want to make changes to medical advance directive? No - Patient declined  No - Patient declined No - Patient declined  No - Patient declined   Copy of [place] Chart? No - copy requested  No - copy requested      Would  patient like information on creating a medical advance directive?  No - Patient declined         Current Medications (verified) Outpatient Encounter Medications as of 10/23/2021  Medication Sig   acetaminophen (TYLENOL) 500 MG tablet Take 500 mg by mouth daily.   albuterol (VENTOLIN HFA) 108 (90 Base) MCG/ACT inhaler Inhale 2 puffs into the lungs every 6 (six) hours as needed for wheezing or shortness of breath.   Ascorbic Acid (VITAMIN C) 1000 MG tablet Take 1,000 mg by mouth daily.   BIOTIN 5000 PO Take 5,000 mcg by mouth daily.   cholecalciferol (VITAMIN D3) 25 MCG (1000 UNIT) tablet Take 1,000 Units by mouth daily.   hydrochlorothiazide (MICROZIDE) 12.5 MG capsule TAKE 1 CAPSULE BY MOUTH DAILY.   ipratropium-albuterol (DUONEB) 0.5-2.5 (3) MG/3ML SOLN Take 3 mLs by nebulization every 6 (six) hours as needed.   metoprolol tartrate (LOPRESSOR) 25 MG tablet TAKE 1 TABLET BY MOUTH DAILY. (Patient taking differently: Take 25 mg by mouth daily.)   Multiple Vitamins-Minerals (CENTRUM SILVER ADULT 50+) TABS Take 1 tablet by mouth daily.   Propylene Glycol (SYSTANE BALANCE) 0.6 % SOLN Place 1 drop into both eyes daily as needed (dry eyes).   sertraline (ZOLOFT) 50 MG tablet Take 1 tablet (50 mg total) by mouth daily.   vitamin B-12 (CYANOCOBALAMIN) 1000 MCG tablet Take 1,000 mcg by mouth daily.   warfarin (COUMADIN) 5 MG tablet TAKE 1 TO 2 TABLETS  BY MOUTH DAILY AS DIRECTED BY THE COUMADIN CLINIC   No facility-administered encounter medications on file as of 10/23/2021.    Allergies (verified) Hydrocodone-acetaminophen and Tramadol   History: Past Medical History:  Diagnosis Date   ANXIETY 04/07/2007   Breast cancer of upper-outer quadrant of right female breast ([place]) 01/11/2016   COLONIC POLYPS, HX OF 04/02/2007   adenomatous   DIVERTICULITIS, HX OF 04/02/2007   Diverticulosis    DJD (degenerative joint disease)    Hemorrhoids    HYPERTENSION 04/02/2007   OSTEOARTHRITIS, SEVERE 04/07/2007    Paget's disease of bone    lumbar and sacrum   WEIGHT GAIN 11/13/2007   Past Surgical History:  Procedure Laterality Date   APPENDECTOMY     BREAST LUMPECTOMY WITH RADIOACTIVE SEED LOCALIZATION Right 02/08/2016   Procedure: RIGHT BREAST LUMPECTOMY WITH RADIOACTIVE SEED LOCALIZATION;  Surgeon: Fanny Skates, MD;  Location: [facility];  Service: General;  Laterality: Right;   ORIF FEMUR FRACTURE Left 04/21/2018   Procedure: OPEN REDUCTION INTERNAL FIXATION (ORIF) PERI PROSTHETIC FEMUR FRACTURE;  Surgeon: Leandrew Koyanagi, MD;  Location: [facility];  Service: Orthopedics;  Laterality: Left;   TONSILLECTOMY     TOTAL HIP ARTHROPLASTY  1999, left hip    2006 right hip   TUBAL LIGATION     Family History  Problem Relation Age of Onset   Pancreatic cancer Mother 68       d. 69   Cancer Father 31       hodgkin's lymphoma and leukemia; d. 58   Colon cancer Brother        dx. late 22s; smoker   Pancreatic cancer Brother 84       d. 45; smoker   Lung cancer Brother 39       smoker   Heart attack Brother        d. late 71s   Colon cancer Paternal 56    Breast cancer Daughter 65       negative genetic testing in 2016   Skin cancer Daughter        non-melanoma type; +sun exposure   Kidney failure Son 54       +EtOH abuse resulting in liver, kidney, and pancreas failure   Diabetes Maternal Uncle        d. later age   Heart disease Neg Hx        family   Esophageal cancer Neg Hx    Rectal cancer Neg Hx    Stomach cancer Neg Hx    Social History   Socioeconomic History   Marital status: Married    Spouse name: Not on file   Number of children: 2   Years of education: Not on file   Highest education level: Not on file  Occupational History   Occupation: retired  Tobacco Use   Smoking status: Former    Packs/day: 1.00    Years: 27.00    Pack years: 27.00    Types: Cigarettes    Quit date: 06/04/1978    Years since quitting: 43.4   Smokeless tobacco: Never  Vaping  Use   Vaping Use: Never used  Substance and Sexual Activity   Alcohol use: Yes    Alcohol/week: 7.0 standard drinks    Types: 7 Glasses of wine per week    Comment: 1-2 glasses wine after dinner, but not necessarily each day   Drug use: No   Sexual activity: Not on file  Other Topics Concern  Not on file  Social History Narrative   Not on file   Social Determinants of Health   Financial Resource Strain: Low Risk    Difficulty of Paying Living Expenses: Not hard at all  Food Insecurity: No Food Insecurity   Worried About Charity fundraiser in the Last Year: Never true   Arboriculturist in the Last Year: Never true  Transportation Needs: No Transportation Needs   Lack of Transportation (Medical): No   Lack of Transportation (Non-Medical): No  Physical Activity: Insufficiently Active   Days of Exercise per Week: 5 days   Minutes of Exercise per Session: 20 min  Stress: No Stress Concern Present   Feeling of Stress : Not at all  Social Connections: Moderately Integrated   Frequency of Communication with Friends and Family: More than three times a week   Frequency of Social Gatherings with Friends and Family: More than three times a week   Attends Religious Services: More than 4 times per year   Active Member of [employer] or Organizations: Yes   Attends Archivist Meetings: More than 4 times per year   Marital Status: Widowed    Tobacco Counseling Counseling given: Not Answered   Clinical Intake:  Diabetic?  No   Activities of Daily Living    10/23/2021   11:38 AM 08/14/2021    9:00 PM  In your present state of health, do you have any difficulty performing the following activities:  Hearing? 0 0  Vision? 0 0  Difficulty concentrating or making decisions? 0 0  Walking or climbing stairs? 0 0  Dressing or bathing? 0 0  Doing errands, shopping? 0 0  Preparing Food and eating ? N   Using the Toilet? N   In the past six months, have you accidently leaked  urine? N   Do you have problems with loss of bowel control? N   Managing your Medications? N   Managing your Finances? N   Housekeeping or managing your Housekeeping? N     Patient Care Team: [name], [name], MD as PCP - General (Family Medicine) [name], MD as PCP - Cardiology (Cardiology) Fanny Skates, MD as Consulting Physician (General Surgery) Truitt Merle, MD as Consulting Physician (Hematology) Gery Pray, MD as Consulting Physician (Radiation Oncology) Delice Bison Charlestine Massed, NP as Nurse Practitioner (Hematology and Oncology) Viona Gilmore, [facility] as Pharmacist (Pharmacist)  Indicate any recent Medical Services you may have received from other than [facility] providers in the past year (date may be approximate).     Assessment:   This is a routine wellness examination for [place].  Hearing/Vision screen Hearing Screening - Comments:: No difficulty hearing Vision Screening - Comments:: Wears glasses. Followed by [place] issues and exercise activities discussed: Exercise limited by: None identified   Goals Addressed               This Visit's Progress     Patient stated (pt-stated)        No current goals       Depression Screen    10/23/2021   11:35 AM 08/22/2021   11:24 AM 05/10/2021   12:01 PM 09/30/2020   11:43 AM 09/30/2020   11:40 AM 09/21/2019    1:54 PM 02/24/2018   10:07 AM  PHQ 2/9 Scores  PHQ - 2 Score 0 0 0 0 0 0 0  PHQ- 9 Score   0        Fall  Risk    10/23/2021   11:38 AM 08/22/2021   11:25 AM 05/10/2021   12:02 PM 09/30/2020   11:43 AM 03/24/2020    3:41 PM  Fall Risk   Falls in the past year? 0 0 0 0 0  Number falls in past yr: 0 0  0 0  Injury with Fall? 0 0  0 0  Risk for fall due to : No Fall Risks History of fall(s)     Follow up  Falls evaluation completed   Education provided    [place]:  Any stairs in or around the home? Yes  If so, are there any without  handrails? No  Home free of loose throw rugs in walkways, pet beds, electrical cords, etc? Yes  Adequate lighting in your home to reduce risk of falls? Yes   ASSISTIVE DEVICES UTILIZED TO PREVENT FALLS:  Life alert? Yes  Use of a cane, walker or w/c? Yes  Grab bars in the bathroom? Yes  Shower chair or bench in shower? Yes  Elevated toilet seat or a handicapped toilet? Yes   TIMED UP AND GO:  Was the test performed? No . Audio Visit  Cognitive Function:     Immunizations Immunization History  Administered Date(s) Administered   Fluad Quad(high Dose 65+) 03/22/2019, 03/22/2020   Influenza Split 04/05/2011, 05/07/2012   Influenza Whole 03/04/2006, 05/06/2007, 02/25/2008, 03/25/2010, 03/21/2013   Influenza, High Dose Seasonal PF 04/05/2015, 02/29/2016, 04/19/2017, 04/04/2018, 03/23/2021   Influenza,inj,Quad PF,6+ Mos 04/07/2014   Janssen (J&J) SARS-COV-2 Vaccination 08/18/2019   Moderna SARS-COV2 Booster Vaccination 04/30/2020   Pneumococcal Conjugate-13 06/12/2013   Pneumococcal Polysaccharide-23 03/04/2006   Tdap 01/24/2015   Tetanus 06/12/2013   Zoster Recombinat (Shingrix) 10/11/2020, 01/13/2021   Zoster, Live 09/25/2013    TDAP status: Up to date  Flu Vaccine status: Up to date  Pneumococcal vaccine status: Up to date  Covid-19 vaccine status: Completed vaccines  Qualifies for Shingles Vaccine? Yes   Zostavax completed Yes   Shingrix Completed?: Yes  Screening Tests Health Maintenance  Topic Date Due   INFLUENZA VACCINE  01/02/2022   MAMMOGRAM  03/20/2022   TETANUS/TDAP  01/23/2025   Pneumonia Vaccine 81+ Years old  Completed   DEXA SCAN  Completed   Zoster Vaccines- Shingrix  Completed   HPV VACCINES  Aged Out   COLONOSCOPY (Pts 45-70yr Insurance coverage will need to be confirmed)  Discontinued   COVID-19 Vaccine  Discontinued    Health Maintenance  There are no preventive care reminders to display for this patient.   Colorectal cancer  screening: No longer required.   Mammogram status: No longer required due to Age.  Bone Density status: Completed 02/03/20. Results reflect: Bone density results: NORMAL. Repeat every   years.  Lung Cancer Screening: (Low Dose CT Chest recommended if Age 86-80years, 30 pack-year currently smoking OR have quit w/in 15years.) does not qualify.     Additional Screening:  Hepatitis C Screening: does not qualify; Completed   Vision Screening: Recommended annual ophthalmology exams for early detection of glaucoma and other disorders of the eye. Is the patient up to date with their annual eye exam?  Yes  Who is the provider or what is the name of the office in which the patient attends annual eye exams[facility] is not established with a provider, would they like to be referred to a provider to establish care? No .   Dental Screening: Recommended annual  dental exams for proper oral hygiene  Community Resource Referral / Chronic Care Management:  CRR required this visit?  No   CCM required this visit?  No      Plan:     I have personally reviewed and noted the following in the patient's chart:   Medical and social history Use of alcohol, tobacco or illicit drugs  Current medications and supplements including opioid prescriptions.  Functional ability and status Nutritional status Physical activity Advanced directives List of other physicians Hospitalizations, surgeries, and ER visits in previous 12 months Vitals Screenings to include cognitive, depression, and falls Referrals and appointments  In addition, I have reviewed and discussed with patient certain preventive protocols, quality metrics, and best practice recommendations. A written personalized care plan for preventive services as well as general preventive health recommendations were provided to patient.     Criselda Peaches, LPN   09/20/6220   Nurse Notes: None

## 2021-10-23 NOTE — Patient Instructions (Addendum)
Teresa Frey , Thank you for taking time to come for your Medicare Wellness Visit. I appreciate your ongoing commitment to your health goals. Please review the following plan we discussed and let me know if I can assist you in the future.   These are the goals we discussed:  Goals       Exercise 3x per week (30 min per time)      Patient stated (pt-stated)      No current goals      Prevent falls      Continue daily low impact exercise. Fall precautions. Continue calcium and vit D.         This is a list of the screening recommended for you and due dates:  Health Maintenance  Topic Date Due   Flu Shot  01/02/2022   Mammogram  03/20/2022   Tetanus Vaccine  01/23/2025   Pneumonia Vaccine  Completed   DEXA scan (bone density measurement)  Completed   Zoster (Shingles) Vaccine  Completed   HPV Vaccine  Aged Out   Colon Cancer Screening  Discontinued   COVID-19 Vaccine  Discontinued   Advanced directives: Yes  Conditions/risks identified: None  Next appointment: Follow up in one year for your annual wellness visit    Preventive Care 65 Years and Older, Female Preventive care refers to lifestyle choices and visits with your health care provider that can promote health and wellness. What does preventive care include? A yearly physical exam. This is also called an annual well check. Dental exams once or twice a year. Routine eye exams. Ask your health care provider how often you should have your eyes checked. Personal lifestyle choices, including: Daily care of your teeth and gums. Regular physical activity. Eating a healthy diet. Avoiding tobacco and drug use. Limiting alcohol use. Practicing safe sex. Taking low-dose aspirin every day. Taking vitamin and mineral supplements as recommended by your health care provider. What happens during an annual well check? The services and screenings done by your health care provider during your annual well check will depend on your  age, overall health, lifestyle risk factors, and family history of disease. Counseling  Your health care provider may ask you questions about your: Alcohol use. Tobacco use. Drug use. Emotional well-being. Home and relationship well-being. Sexual activity. Eating habits. History of falls. Memory and ability to understand (cognition). Work and work Statistician. Reproductive health. Screening  You may have the following tests or measurements: Height, weight, and BMI. Blood pressure. Lipid and cholesterol levels. These may be checked every 5 years, or more frequently if you are over 58 years old. Skin check. Lung cancer screening. You may have this screening every year starting at age 70 if you have a 30-pack-year history of smoking and currently smoke or have quit within the past 15 years. Fecal occult blood test (FOBT) of the stool. You may have this test every year starting at age 42. Flexible sigmoidoscopy or colonoscopy. You may have a sigmoidoscopy every 5 years or a colonoscopy every 10 years starting at age 42. Hepatitis C blood test. Hepatitis B blood test. Sexually transmitted disease (STD) testing. Diabetes screening. This is done by checking your blood sugar (glucose) after you have not eaten for a while (fasting). You may have this done every 1-3 years. Bone density scan. This is done to screen for osteoporosis. You may have this done starting at age 30. Mammogram. This may be done every 1-2 years. Talk to your health care provider about  how often you should have regular mammograms. Talk with your health care provider about your test results, treatment options, and if necessary, the need for more tests. Vaccines  Your health care provider may recommend certain vaccines, such as: Influenza vaccine. This is recommended every year. Tetanus, diphtheria, and acellular pertussis (Tdap, Td) vaccine. You may need a Td booster every 10 years. Zoster vaccine. You may need this after  age 2. Pneumococcal 13-valent conjugate (PCV13) vaccine. One dose is recommended after age 20. Pneumococcal polysaccharide (PPSV23) vaccine. One dose is recommended after age 12. Talk to your health care provider about which screenings and vaccines you need and how often you need them. This information is not intended to replace advice given to you by your health care provider. Make sure you discuss any questions you have with your health care provider. Document Released: 06/17/2015 Document Revised: 02/08/2016 Document Reviewed: 03/22/2015 Elsevier Interactive Patient Education  2017 [place] Prevention in the Home Falls can cause injuries. They can happen to people of all ages. There are many things you can do to make your home safe and to help prevent falls. What can I do on the outside of my home? Regularly fix the edges of walkways and driveways and fix any cracks. Remove anything that might make you trip as you walk through a door, such as a raised step or threshold. Trim any bushes or trees on the path to your home. Use bright outdoor lighting. Clear any walking paths of anything that might make someone trip, such as rocks or tools. Regularly check to see if handrails are loose or broken. Make sure that both sides of any steps have handrails. Any raised decks and porches should have guardrails on the edges. Have any leaves, snow, or ice cleared regularly. Use sand or salt on walking paths during winter. Clean up any spills in your garage right away. This includes oil or grease spills. What can I do in the bathroom? Use night lights. Install grab bars by the toilet and in the tub and shower. Do not use towel bars as grab bars. Use non-skid mats or decals in the tub or shower. If you need to sit down in the shower, use a plastic, non-slip stool. Keep the floor dry. Clean up any water that spills on the floor as soon as it happens. Remove soap buildup in the tub or shower  regularly. Attach bath mats securely with double-sided non-slip rug tape. Do not have throw rugs and other things on the floor that can make you trip. What can I do in the bedroom? Use night lights. Make sure that you have a light by your bed that is easy to reach. Do not use any sheets or blankets that are too big for your bed. They should not hang down onto the floor. Have a firm chair that has side arms. You can use this for support while you get dressed. Do not have throw rugs and other things on the floor that can make you trip. What can I do in the kitchen? Clean up any spills right away. Avoid walking on wet floors. Keep items that you use a lot in easy-to-reach places. If you need to reach something above you, use a strong step stool that has a grab bar. Keep electrical cords out of the way. Do not use floor polish or wax that makes floors slippery. If you must use wax, use non-skid floor wax. Do not have throw rugs and other  things on the floor that can make you trip. What can I do with my stairs? Do not leave any items on the stairs. Make sure that there are handrails on both sides of the stairs and use them. Fix handrails that are broken or loose. Make sure that handrails are as long as the stairways. Check any carpeting to make sure that it is firmly attached to the stairs. Fix any carpet that is loose or worn. Avoid having throw rugs at the top or bottom of the stairs. If you do have throw rugs, attach them to the floor with carpet tape. Make sure that you have a light switch at the top of the stairs and the bottom of the stairs. If you do not have them, ask someone to add them for you. What else can I do to help prevent falls? Wear shoes that: Do not have high heels. Have rubber bottoms. Are comfortable and fit you well. Are closed at the toe. Do not wear sandals. If you use a stepladder: Make sure that it is fully opened. Do not climb a closed stepladder. Make sure that  both sides of the stepladder are locked into place. Ask someone to hold it for you, if possible. Clearly mark and make sure that you can see: Any grab bars or handrails. First and last steps. Where the edge of each step is. Use tools that help you move around (mobility aids) if they are needed. These include: Canes. Walkers. Scooters. Crutches. Turn on the lights when you go into a dark area. Replace any light bulbs as soon as they burn out. Set up your furniture so you have a clear path. Avoid moving your furniture around. If any of your floors are uneven, fix them. If there are any pets around you, be aware of where they are. Review your medicines with your doctor. Some medicines can make you feel dizzy. This can increase your chance of falling. Ask your doctor what other things that you can do to help prevent falls. This information is not intended to replace advice given to you by your health care provider. Make sure you discuss any questions you have with your health care provider. Document Released: 03/17/2009 Document Revised: 10/27/2015 Document Reviewed: 06/25/2014 Elsevier Interactive Patient Education  2017 [employer].

## 2021-11-13 ENCOUNTER — Ambulatory Visit: Payer: Medicare Other

## 2021-11-13 DIAGNOSIS — Z7901 Long term (current) use of anticoagulants: Secondary | ICD-10-CM | POA: Diagnosis not present

## 2021-11-13 DIAGNOSIS — I4891 Unspecified atrial fibrillation: Secondary | ICD-10-CM | POA: Diagnosis not present

## 2021-11-13 LAB — POCT INR: INR: 3.2 — AB (ref 2.0–3.0)

## 2021-11-13 NOTE — Patient Instructions (Signed)
Description   Only take 1 tablet tomorrow and then continue 1.5 tablets Daily, except 2 tablets each Monday and Friday.   Repeat INR in 6 weeks

## 2021-12-21 DIAGNOSIS — H90A31 Mixed conductive and sensorineural hearing loss, unilateral, right ear with restricted hearing on the contralateral side: Secondary | ICD-10-CM | POA: Diagnosis not present

## 2021-12-21 DIAGNOSIS — H90A22 Sensorineural hearing loss, unilateral, left ear, with restricted hearing on the contralateral side: Secondary | ICD-10-CM | POA: Diagnosis not present

## 2021-12-25 ENCOUNTER — Ambulatory Visit: Payer: Medicare Other | Admitting: *Deleted

## 2021-12-25 DIAGNOSIS — Z7901 Long term (current) use of anticoagulants: Secondary | ICD-10-CM

## 2021-12-25 DIAGNOSIS — Z5181 Encounter for therapeutic drug level monitoring: Secondary | ICD-10-CM | POA: Diagnosis not present

## 2021-12-25 DIAGNOSIS — I4891 Unspecified atrial fibrillation: Secondary | ICD-10-CM | POA: Diagnosis not present

## 2021-12-25 LAB — POCT INR: INR: 2.7 (ref 2.0–3.0)

## 2021-12-25 NOTE — Patient Instructions (Signed)
Description   Continue warfarin 1.5 tablets daily, except 2 tablets each Monday and Friday.   Repeat INR in 6 weeks. [facility] [phone]

## 2022-01-04 DIAGNOSIS — H905 Unspecified sensorineural hearing loss: Secondary | ICD-10-CM | POA: Diagnosis not present

## 2022-01-10 ENCOUNTER — Other Ambulatory Visit: Payer: Self-pay | Admitting: Cardiology

## 2022-01-10 DIAGNOSIS — I4891 Unspecified atrial fibrillation: Secondary | ICD-10-CM

## 2022-02-08 ENCOUNTER — Other Ambulatory Visit: Payer: Self-pay | Admitting: Cardiology

## 2022-02-12 ENCOUNTER — Ambulatory Visit: Payer: Medicare Other | Attending: Cardiology | Admitting: *Deleted

## 2022-02-12 DIAGNOSIS — I4891 Unspecified atrial fibrillation: Secondary | ICD-10-CM

## 2022-02-12 DIAGNOSIS — Z7901 Long term (current) use of anticoagulants: Secondary | ICD-10-CM | POA: Diagnosis not present

## 2022-02-12 DIAGNOSIS — Z5181 Encounter for therapeutic drug level monitoring: Secondary | ICD-10-CM

## 2022-02-12 LAB — POCT INR: INR: 2.8 (ref 2.0–3.0)

## 2022-02-12 NOTE — Patient Instructions (Signed)
Description   Continue warfarin 1.5 tablets daily, except 2 tablets each Monday and Friday.   Repeat INR in 6 weeks. [facility] [phone]

## 2022-03-12 ENCOUNTER — Other Ambulatory Visit: Payer: Self-pay | Admitting: Family Medicine

## 2022-03-13 DIAGNOSIS — L814 Other melanin hyperpigmentation: Secondary | ICD-10-CM | POA: Diagnosis not present

## 2022-03-13 DIAGNOSIS — L821 Other seborrheic keratosis: Secondary | ICD-10-CM | POA: Diagnosis not present

## 2022-03-13 DIAGNOSIS — L820 Inflamed seborrheic keratosis: Secondary | ICD-10-CM | POA: Diagnosis not present

## 2022-03-13 DIAGNOSIS — D225 Melanocytic nevi of trunk: Secondary | ICD-10-CM | POA: Diagnosis not present

## 2022-03-20 DIAGNOSIS — Z1231 Encounter for screening mammogram for malignant neoplasm of breast: Secondary | ICD-10-CM | POA: Diagnosis not present

## 2022-03-20 LAB — HM MAMMOGRAPHY

## 2022-03-21 ENCOUNTER — Encounter: Payer: Self-pay | Admitting: Family Medicine

## 2022-03-26 ENCOUNTER — Ambulatory Visit: Payer: Medicare Other | Attending: Cardiology

## 2022-03-26 DIAGNOSIS — I4891 Unspecified atrial fibrillation: Secondary | ICD-10-CM | POA: Diagnosis not present

## 2022-03-26 DIAGNOSIS — Z7901 Long term (current) use of anticoagulants: Secondary | ICD-10-CM

## 2022-03-26 LAB — POCT INR: INR: 5.1 — AB (ref 2.0–3.0)

## 2022-03-26 NOTE — Patient Instructions (Signed)
HOLD TUESDAY and WEDNESDAY then Continue warfarin 1.5 tablets daily, except 2 tablets each Monday and Friday.   Repeat INR in 1 week. [facility] [phone]

## 2022-04-02 ENCOUNTER — Ambulatory Visit: Payer: Medicare Other | Attending: Cardiology | Admitting: *Deleted

## 2022-04-02 DIAGNOSIS — I4891 Unspecified atrial fibrillation: Secondary | ICD-10-CM

## 2022-04-02 DIAGNOSIS — Z7901 Long term (current) use of anticoagulants: Secondary | ICD-10-CM | POA: Diagnosis not present

## 2022-04-02 LAB — POCT INR: INR: 2.2 (ref 2.0–3.0)

## 2022-04-02 NOTE — Patient Instructions (Signed)
Description   Continue warfarin 1.5 tablets daily, except 2 tablets each Monday and Friday.   Repeat INR in 4 weeks. [facility] [phone]

## 2022-04-29 NOTE — Progress Notes (Signed)
Cardiology Office Note   Date:  04/30/2022   ID:  Teresa Frey, DOB 1929-08-05, MRN [id number]  PCP:  [name], [name], MD  Cardiologist:   [name], MD   Chief Complaint  Patient presents with   Atrial Fibrillation      History of Present Illness: Teresa Frey is a 88 y.o. female who presents for follow up of atrial fib.  Since I saw her she has done OK.  The patient denies any new symptoms such as chest discomfort, neck or arm discomfort. There has been no new shortness of breath, PND or orthopnea. There have been no reported palpitations, presyncope or syncope.  She does mention the other day that she had some black stools.  She mentions the same "uneasy feeling" in her chest that she mentioned last year but is very brief and she does not think that her atrial fibrillation.  Past Medical History:  Diagnosis Date   ANXIETY 04/07/2007   Breast cancer of upper-outer quadrant of right female breast ([place]) 01/11/2016   COLONIC POLYPS, HX OF 04/02/2007   adenomatous   DIVERTICULITIS, HX OF 04/02/2007   Diverticulosis    DJD (degenerative joint disease)    Hemorrhoids    HYPERTENSION 04/02/2007   OSTEOARTHRITIS, SEVERE 04/07/2007   Paget's disease of bone    lumbar and sacrum   WEIGHT GAIN 11/13/2007    Past Surgical History:  Procedure Laterality Date   APPENDECTOMY     BREAST LUMPECTOMY WITH RADIOACTIVE SEED LOCALIZATION Right 02/08/2016   Procedure: RIGHT BREAST LUMPECTOMY WITH RADIOACTIVE SEED LOCALIZATION;  Surgeon: Fanny Skates, MD;  Location: [place];  Service: General;  Laterality: Right;   ORIF FEMUR FRACTURE Left 04/21/2018   Procedure: OPEN REDUCTION INTERNAL FIXATION (ORIF) PERI PROSTHETIC FEMUR FRACTURE;  Surgeon: Leandrew Koyanagi, MD;  Location: [place];  Service: Orthopedics;  Laterality: Left;   TONSILLECTOMY     TOTAL HIP ARTHROPLASTY  1999, left hip    2006 right hip   TUBAL LIGATION       Current Outpatient Medications  Medication  Sig Dispense Refill   acetaminophen (TYLENOL) 500 MG tablet Take 500 mg by mouth daily.     albuterol (VENTOLIN HFA) 108 (90 Base) MCG/ACT inhaler Inhale 2 puffs into the lungs every 6 (six) hours as needed for wheezing or shortness of breath. 18 g 4   Ascorbic Acid (VITAMIN C) 1000 MG tablet Take 1,000 mg by mouth daily.     BIOTIN 5000 PO Take 5,000 mcg by mouth daily.     cholecalciferol (VITAMIN D3) 25 MCG (1000 UNIT) tablet Take 1,000 Units by mouth daily.     hydrochlorothiazide (MICROZIDE) 12.5 MG capsule TAKE 1 CAPSULE BY MOUTH DAILY. 90 capsule 1   ipratropium-albuterol (DUONEB) 0.5-2.5 (3) MG/3ML SOLN Take 3 mLs by nebulization every 6 (six) hours as needed. 360 mL 1   metoprolol tartrate (LOPRESSOR) 25 MG tablet TAKE 1 TABLET BY MOUTH DAILY. 90 tablet 4   Multiple Vitamins-Minerals (CENTRUM SILVER ADULT 50+) TABS Take 1 tablet by mouth daily.     Propylene Glycol (SYSTANE BALANCE) 0.6 % SOLN Place 1 drop into both eyes daily as needed (dry eyes).     sertraline (ZOLOFT) 50 MG tablet Take 1 tablet (50 mg total) by mouth daily. 90 tablet 3   vitamin B-12 (CYANOCOBALAMIN) 1000 MCG tablet Take 1,000 mcg by mouth daily.     warfarin (COUMADIN) 5 MG tablet Take 1 to 2 tablets by  mouth daily as directed by the coumadin clinic 160 tablet 1   No current facility-administered medications for this visit.    Allergies:   Hydrocodone-acetaminophen and Tramadol    ROS:  Please see the history of present illness.   Otherwise, review of systems are positive for none.   All other systems are reviewed and negative.    PHYSICAL EXAM: VS:  BP 130/70   Pulse 63   Ht '5\' 2"'$  (1.575 m)   Wt 163 lb 3.2 oz (74 kg)   SpO2 91%   BMI 29.85 kg/m  , BMI Body mass index is 29.85 kg/m. GENERAL:  Well appearing NECK:  No jugular venous distention, waveform within normal limits, carotid upstroke brisk and symmetric, no bruits, no thyromegaly LUNGS:  Clear to auscultation bilaterally CHEST:   Unremarkable HEART:  PMI not displaced or sustained,S1 and S2 within normal limits, no S3, no S4, no clicks, no rubs, 2 out of 6 apical systolic murmur heard best at the right and left upper sternal border early peaking, no diastolic murmurs ABD:  Flat, positive bowel sounds normal in frequency in pitch, no bruits, no rebound, no guarding, no midline pulsatile mass, no hepatomegaly, no splenomegaly EXT:  2 plus pulses throughout, no edema, no cyanosis no clubbing  EKG:  EKG is ordered today. Sinus rhythm, rate 63, axis within normal limits, intervals within normal limits, no acute ST-T wave changes.   Recent Labs: 10/06/2021: ALT 11; BUN 19; Creatinine 0.78; Hemoglobin 14.2; Platelet Count 184; Potassium 4.4; Sodium 144    Lipid Panel    Component Value Date/Time   CHOL 200 10/06/2015 0846   TRIG 133.0 10/06/2015 0846   TRIG 109 04/02/2006 1019   HDL 53.20 10/06/2015 0846   CHOLHDL 4 10/06/2015 0846   VLDL 26.6 10/06/2015 0846   LDLCALC 121 (H) 10/06/2015 0846   LDLDIRECT 126.1 06/12/2013 0949      Wt Readings from Last 3 Encounters:  04/30/22 163 lb 3.2 oz (74 kg)  10/23/21 163 lb (73.9 kg)  10/06/21 163 lb 8 oz (74.2 kg)      Other studies Reviewed: Additional studies/ records that were reviewed today include:  Labs Review of the above records demonstrates:   See elsewhere   ASSESSMENT AND PLAN:   Atrial Fib:  [name] has a CHA2DS2 - VASc score of 4.  I am going to check a CBC today because she has had some black stools.  If these persist she is going to call her primary provider.    Murmur: The patient had some mild aortic stenosis on echo in 2019  Think clinically this will be difficult.  No change in therapy.  No further imaging.  Current medicines are reviewed at length with the patient today.  The patient has concerns regarding medicines.  The following changes have been made: None  Labs/ tests ordered today include:      Orders Placed This  Encounter  Procedures   CBC   EKG 12-Lead     Disposition:   FU with me in 12 months.     Signed, [name], MD  04/30/2022 12:28 PM    [facility]

## 2022-04-30 ENCOUNTER — Ambulatory Visit: Payer: Medicare Other | Attending: Cardiology | Admitting: *Deleted

## 2022-04-30 ENCOUNTER — Encounter: Payer: Self-pay | Admitting: Cardiology

## 2022-04-30 ENCOUNTER — Ambulatory Visit (INDEPENDENT_AMBULATORY_CARE_PROVIDER_SITE_OTHER): Payer: Medicare Other | Admitting: Cardiology

## 2022-04-30 VITALS — BP 130/70 | HR 63 | Ht 62.0 in | Wt 163.2 lb

## 2022-04-30 DIAGNOSIS — I4891 Unspecified atrial fibrillation: Secondary | ICD-10-CM

## 2022-04-30 DIAGNOSIS — Z7901 Long term (current) use of anticoagulants: Secondary | ICD-10-CM | POA: Diagnosis not present

## 2022-04-30 LAB — POCT INR: INR: 2.6 (ref 2.0–3.0)

## 2022-04-30 NOTE — Patient Instructions (Signed)
Medication Instructions:  Your physician recommends that you continue on your current medications as directed. Please refer to the Current Medication list given to you today.  *If you need a refill on your cardiac medications before your next appointment, please call your pharmacy*   Lab Work: Your physician recommends that you have labs drawn today: CBC  If you have labs (blood work) drawn today and your tests are completely normal, you will receive your results only by: [place] (if you have MyChart) OR A paper copy in the mail If you have any lab test that is abnormal or we need to change your treatment, we will call you to review the results.   Follow-Up: At [facility], you and your health needs are our priority.  As part of our continuing mission to provide you with exceptional heart care, we have created designated Provider Care Teams.  These Care Teams include your primary Cardiologist (physician) and Advanced Practice Providers (APPs -  Physician Assistants and Nurse Practitioners) who all work together to provide you with the care you need, when you need it.  We recommend signing up for the patient portal called "MyChart".  Sign up information is provided on this After Visit Summary.  MyChart is used to connect with patients for Virtual Visits (Telemedicine).  Patients are able to view lab/test results, encounter notes, upcoming appointments, etc.  Non-urgent messages can be sent to your provider as well.   To learn more about what you can do with MyChart, go to [email or web address].    Your next appointment:   12 month(s)  The format for your next appointment:   In Person  Provider:   [name], MD

## 2022-04-30 NOTE — Patient Instructions (Signed)
Description   Continue warfarin 1.5 tablets daily, except 2 tablets each Monday and Friday.   Repeat INR in 5 weeks. Coumadin Clinic [phone]

## 2022-05-01 LAB — CBC
Hematocrit: 42.1 % (ref 34.0–46.6)
Hemoglobin: 14 g/dL (ref 11.1–15.9)
MCH: 29.2 pg (ref 26.6–33.0)
MCHC: 33.3 g/dL (ref 31.5–35.7)
MCV: 88 fL (ref 79–97)
Platelets: 239 10*3/uL (ref 150–450)
RBC: 4.79 x10E6/uL (ref 3.77–5.28)
RDW: 13 % (ref 11.7–15.4)
WBC: 10 10*3/uL (ref 3.4–10.8)

## 2022-05-03 ENCOUNTER — Encounter: Payer: Self-pay | Admitting: *Deleted

## 2022-05-07 ENCOUNTER — Other Ambulatory Visit: Payer: Self-pay

## 2022-05-07 ENCOUNTER — Inpatient Hospital Stay (HOSPITAL_COMMUNITY)
Admission: EM | Admit: 2022-05-07 | Discharge: 2022-05-11 | DRG: 381 | Disposition: A | Discharge status: Home Health | Payer: Medicare Other | Attending: Internal Medicine | Admitting: Internal Medicine

## 2022-05-07 ENCOUNTER — Encounter (HOSPITAL_COMMUNITY): Payer: Self-pay

## 2022-05-07 DIAGNOSIS — I10 Essential (primary) hypertension: Secondary | ICD-10-CM | POA: Diagnosis not present

## 2022-05-07 DIAGNOSIS — R1032 Left lower quadrant pain: Secondary | ICD-10-CM | POA: Diagnosis not present

## 2022-05-07 DIAGNOSIS — K2101 Gastro-esophageal reflux disease with esophagitis, with bleeding: Secondary | ICD-10-CM | POA: Diagnosis not present

## 2022-05-07 DIAGNOSIS — Z800 Family history of malignant neoplasm of digestive organs: Secondary | ICD-10-CM

## 2022-05-07 DIAGNOSIS — K2211 Ulcer of esophagus with bleeding: Principal | ICD-10-CM | POA: Diagnosis present

## 2022-05-07 DIAGNOSIS — R791 Abnormal coagulation profile: Secondary | ICD-10-CM | POA: Diagnosis present

## 2022-05-07 DIAGNOSIS — B9729 Other coronavirus as the cause of diseases classified elsewhere: Secondary | ICD-10-CM | POA: Diagnosis present

## 2022-05-07 DIAGNOSIS — K2100 Gastro-esophageal reflux disease with esophagitis, without bleeding: Secondary | ICD-10-CM | POA: Diagnosis not present

## 2022-05-07 DIAGNOSIS — K5791 Diverticulosis of intestine, part unspecified, without perforation or abscess with bleeding: Secondary | ICD-10-CM | POA: Diagnosis present

## 2022-05-07 DIAGNOSIS — Z20822 Contact with and (suspected) exposure to covid-19: Secondary | ICD-10-CM | POA: Diagnosis present

## 2022-05-07 DIAGNOSIS — Z8249 Family history of ischemic heart disease and other diseases of the circulatory system: Secondary | ICD-10-CM

## 2022-05-07 DIAGNOSIS — Z66 Do not resuscitate: Secondary | ICD-10-CM | POA: Diagnosis not present

## 2022-05-07 DIAGNOSIS — Z853 Personal history of malignant neoplasm of breast: Secondary | ICD-10-CM

## 2022-05-07 DIAGNOSIS — Z806 Family history of leukemia: Secondary | ICD-10-CM | POA: Diagnosis not present

## 2022-05-07 DIAGNOSIS — Z807 Family history of other malignant neoplasms of lymphoid, hematopoietic and related tissues: Secondary | ICD-10-CM

## 2022-05-07 DIAGNOSIS — Z808 Family history of malignant neoplasm of other organs or systems: Secondary | ICD-10-CM | POA: Diagnosis not present

## 2022-05-07 DIAGNOSIS — K5731 Diverticulosis of large intestine without perforation or abscess with bleeding: Secondary | ICD-10-CM | POA: Diagnosis not present

## 2022-05-07 DIAGNOSIS — I480 Paroxysmal atrial fibrillation: Secondary | ICD-10-CM | POA: Diagnosis present

## 2022-05-07 DIAGNOSIS — Z96643 Presence of artificial hip joint, bilateral: Secondary | ICD-10-CM | POA: Diagnosis not present

## 2022-05-07 DIAGNOSIS — K921 Melena: Secondary | ICD-10-CM

## 2022-05-07 DIAGNOSIS — D62 Acute posthemorrhagic anemia: Secondary | ICD-10-CM | POA: Diagnosis not present

## 2022-05-07 DIAGNOSIS — Z833 Family history of diabetes mellitus: Secondary | ICD-10-CM | POA: Diagnosis not present

## 2022-05-07 DIAGNOSIS — K449 Diaphragmatic hernia without obstruction or gangrene: Secondary | ICD-10-CM | POA: Diagnosis not present

## 2022-05-07 DIAGNOSIS — I482 Chronic atrial fibrillation, unspecified: Secondary | ICD-10-CM | POA: Diagnosis not present

## 2022-05-07 DIAGNOSIS — Z87891 Personal history of nicotine dependence: Secondary | ICD-10-CM

## 2022-05-07 DIAGNOSIS — I4891 Unspecified atrial fibrillation: Secondary | ICD-10-CM

## 2022-05-07 DIAGNOSIS — Z801 Family history of malignant neoplasm of trachea, bronchus and lung: Secondary | ICD-10-CM | POA: Diagnosis not present

## 2022-05-07 DIAGNOSIS — Z803 Family history of malignant neoplasm of breast: Secondary | ICD-10-CM | POA: Diagnosis not present

## 2022-05-07 DIAGNOSIS — K209 Esophagitis, unspecified without bleeding: Secondary | ICD-10-CM | POA: Diagnosis not present

## 2022-05-07 DIAGNOSIS — R059 Cough, unspecified: Secondary | ICD-10-CM | POA: Diagnosis not present

## 2022-05-07 DIAGNOSIS — Z7901 Long term (current) use of anticoagulants: Secondary | ICD-10-CM

## 2022-05-07 DIAGNOSIS — Z7951 Long term (current) use of inhaled steroids: Secondary | ICD-10-CM

## 2022-05-07 DIAGNOSIS — K2210 Ulcer of esophagus without bleeding: Secondary | ICD-10-CM | POA: Diagnosis not present

## 2022-05-07 DIAGNOSIS — I959 Hypotension, unspecified: Secondary | ICD-10-CM | POA: Diagnosis not present

## 2022-05-07 DIAGNOSIS — R079 Chest pain, unspecified: Secondary | ICD-10-CM | POA: Diagnosis not present

## 2022-05-07 DIAGNOSIS — Z79899 Other long term (current) drug therapy: Secondary | ICD-10-CM | POA: Diagnosis not present

## 2022-05-07 DIAGNOSIS — J449 Chronic obstructive pulmonary disease, unspecified: Secondary | ICD-10-CM | POA: Diagnosis not present

## 2022-05-07 DIAGNOSIS — R0689 Other abnormalities of breathing: Secondary | ICD-10-CM | POA: Diagnosis not present

## 2022-05-07 DIAGNOSIS — K922 Gastrointestinal hemorrhage, unspecified: Principal | ICD-10-CM | POA: Insufficient documentation

## 2022-05-07 LAB — CBC WITH DIFFERENTIAL/PLATELET
Abs Immature Granulocytes: 0.04 10*3/uL (ref 0.00–0.07)
Basophils Absolute: 0 10*3/uL (ref 0.0–0.1)
Basophils Relative: 0 %
Eosinophils Absolute: 0.1 10*3/uL (ref 0.0–0.5)
Eosinophils Relative: 1 %
HCT: 34.8 % — ABNORMAL LOW (ref 36.0–46.0)
Hemoglobin: 11.5 g/dL — ABNORMAL LOW (ref 12.0–15.0)
Immature Granulocytes: 0 %
Lymphocytes Relative: 11 %
Lymphs Abs: 1.1 10*3/uL (ref 0.7–4.0)
MCH: 30.1 pg (ref 26.0–34.0)
MCHC: 33 g/dL (ref 30.0–36.0)
MCV: 91.1 fL (ref 80.0–100.0)
Monocytes Absolute: 0.8 10*3/uL (ref 0.1–1.0)
Monocytes Relative: 8 %
Neutro Abs: 8.6 10*3/uL — ABNORMAL HIGH (ref 1.7–7.7)
Neutrophils Relative %: 80 %
Platelets: 186 10*3/uL (ref 150–400)
RBC: 3.82 MIL/uL — ABNORMAL LOW (ref 3.87–5.11)
RDW: 13.6 % (ref 11.5–15.5)
WBC: 10.6 10*3/uL — ABNORMAL HIGH (ref 4.0–10.5)
nRBC: 0 % (ref 0.0–0.2)

## 2022-05-07 LAB — PROTIME-INR
INR: 3.7 — ABNORMAL HIGH (ref 0.8–1.2)
Prothrombin Time: 36.5 seconds — ABNORMAL HIGH (ref 11.4–15.2)

## 2022-05-07 LAB — TYPE AND SCREEN
ABO/RH(D): O POS
Antibody Screen: NEGATIVE

## 2022-05-07 LAB — IRON AND TIBC
Iron: 55 ug/dL (ref 28–170)
Saturation Ratios: 14 % (ref 10.4–31.8)
TIBC: 391 ug/dL (ref 250–450)
UIBC: 336 ug/dL

## 2022-05-07 LAB — FERRITIN: Ferritin: 16 ng/mL (ref 11–307)

## 2022-05-07 LAB — COMPREHENSIVE METABOLIC PANEL
ALT: 14 U/L (ref 0–44)
AST: 21 U/L (ref 15–41)
Albumin: 3.5 g/dL (ref 3.5–5.0)
Alkaline Phosphatase: 209 U/L — ABNORMAL HIGH (ref 38–126)
Anion gap: 3 — ABNORMAL LOW (ref 5–15)
BUN: 24 mg/dL — ABNORMAL HIGH (ref 8–23)
CO2: 29 mmol/L (ref 22–32)
Calcium: 8.9 mg/dL (ref 8.9–10.3)
Chloride: 108 mmol/L (ref 98–111)
Creatinine, Ser: 0.81 mg/dL (ref 0.44–1.00)
GFR, Estimated: >60 mL/min (ref >60)
Glucose, Bld: 117 mg/dL — ABNORMAL HIGH (ref 70–99)
Potassium: 4 mmol/L (ref 3.5–5.1)
Sodium: 140 mmol/L (ref 135–145)
Total Bilirubin: 0.4 mg/dL (ref 0.3–1.2)
Total Protein: 6.2 g/dL — ABNORMAL LOW (ref 6.5–8.1)

## 2022-05-07 LAB — RETICULOCYTES
Immature Retic Fract: 10.8 % (ref 2.3–15.9)
RBC.: 3.82 MIL/uL — ABNORMAL LOW (ref 3.87–5.11)
Retic Count, Absolute: 66.1 10*3/uL (ref 19.0–186.0)
Retic Ct Pct: 1.7 % (ref 0.4–3.1)

## 2022-05-07 LAB — LIPASE, BLOOD: Lipase: 39 U/L (ref 11–51)

## 2022-05-07 LAB — POC OCCULT BLOOD, ED: Fecal Occult Bld: POSITIVE — AB

## 2022-05-07 MED ORDER — ACETAMINOPHEN 500 MG PO TABS: 500.0000 mg | ORAL_TABLET | Freq: Every day | ORAL | Status: DC

## 2022-05-07 MED ORDER — SERTRALINE HCL 50 MG PO TABS
50.0000 mg | ORAL_TABLET | Freq: Every day | ORAL | Status: DC
  Administered 2022-05-08 – 2022-05-11 (×4): 50 mg via ORAL
  Filled 2022-05-07 (×4): qty 1

## 2022-05-07 MED ORDER — PHYTONADIONE 5 MG PO TABS
5.0000 mg | ORAL_TABLET | Freq: Once | ORAL | Status: AC
  Administered 2022-05-07: 5 mg via ORAL
  Filled 2022-05-07: qty 1

## 2022-05-07 MED ORDER — PANTOPRAZOLE 80MG IVPB - SIMPLE MED
80.0000 mg | Freq: Once | INTRAVENOUS | Status: AC
  Administered 2022-05-07: 80 mg via INTRAVENOUS
  Filled 2022-05-07: qty 100

## 2022-05-07 MED ORDER — ALBUTEROL SULFATE (2.5 MG/3ML) 0.083% IN NEBU
2.5000 mg | INHALATION_SOLUTION | Freq: Four times a day (QID) | RESPIRATORY_TRACT | Status: DC | PRN
  Filled 2022-05-07: qty 3

## 2022-05-07 MED ORDER — IPRATROPIUM-ALBUTEROL 0.5-2.5 (3) MG/3ML IN SOLN: 3.0000 mL | Freq: Four times a day (QID) | RESPIRATORY_TRACT | Status: DC | PRN

## 2022-05-07 MED ORDER — PROPYLENE GLYCOL 0.6 % OP SOLN: 1.0000 [drp] | Freq: Every day | OPHTHALMIC | Status: DC | PRN

## 2022-05-07 MED ORDER — SODIUM CHLORIDE 0.9% IV SOLUTION: Freq: Once | INTRAVENOUS | Status: AC

## 2022-05-07 MED ORDER — ACETAMINOPHEN 325 MG PO TABS
650.0000 mg | ORAL_TABLET | Freq: Four times a day (QID) | ORAL | Status: DC | PRN
  Administered 2022-05-07: 650 mg via ORAL
  Filled 2022-05-07: qty 2

## 2022-05-07 MED ORDER — ONDANSETRON HCL 4 MG PO TABS: 4.0000 mg | ORAL_TABLET | Freq: Four times a day (QID) | ORAL | Status: DC | PRN

## 2022-05-07 MED ORDER — METOPROLOL TARTRATE 5 MG/5ML IV SOLN: 2.5000 mg | Freq: Four times a day (QID) | INTRAVENOUS | Status: DC | PRN

## 2022-05-07 MED ORDER — ONDANSETRON HCL 4 MG/2ML IJ SOLN: 4.0000 mg | Freq: Four times a day (QID) | INTRAMUSCULAR | Status: DC | PRN

## 2022-05-07 MED ORDER — SODIUM CHLORIDE 0.9 % IV BOLUS
1000.0000 mL | Freq: Once | INTRAVENOUS | Status: AC
  Administered 2022-05-07: 1000 mL via INTRAVENOUS

## 2022-05-07 MED ORDER — POLYVINYL ALCOHOL 1.4 % OP SOLN: 1.0000 [drp] | OPHTHALMIC | Status: DC | PRN

## 2022-05-07 MED ORDER — ACETAMINOPHEN 650 MG RE SUPP: 650.0000 mg | Freq: Four times a day (QID) | RECTAL | Status: DC | PRN

## 2022-05-07 MED ORDER — ALBUTEROL SULFATE HFA 108 (90 BASE) MCG/ACT IN AERS: 2.0000 | INHALATION_SPRAY | Freq: Four times a day (QID) | RESPIRATORY_TRACT | Status: DC | PRN

## 2022-05-07 MED ORDER — PANTOPRAZOLE SODIUM 40 MG PO TBEC
40.0000 mg | DELAYED_RELEASE_TABLET | Freq: Two times a day (BID) | ORAL | Status: DC
  Administered 2022-05-07 – 2022-05-11 (×8): 40 mg via ORAL
  Filled 2022-05-07 (×8): qty 1

## 2022-05-07 NOTE — ED Notes (Signed)
Triage completed by Rico Sheehan , RN

## 2022-05-07 NOTE — Progress Notes (Signed)
GI consultation appreciated, will administer FFP x 1 to accelerate coagulopathy correction.

## 2022-05-07 NOTE — H&P (Signed)
History and Physical    Teresa Frey IWL:[id number] DOB: 07-16-1929 DOA: 05/07/2022  PCP: [name], [name], MD (Confirm with patient/family/NH records and if not entered, this has to be entered at [facility] point of entry) Patient coming from: Home  I have personally briefly reviewed patient's old medical records in [place]  Chief Complaint: Dark-colored stool  HPI: Teresa Frey is a 88 y.o. female with medical history significant of PAF on Coumadin, HTN, diverticulosis, presented with persistent dark-colored stool.  Symptoms started about 10 days ago, patient started noticed dark-colored stool, without abdominal pain, denies any other GI symptoms, no nauseous vomiting or diarrhea.  No recent weight loss.  She thought the episode will pass by itself but after 1 week she still having the same dark-colored stool every day.  Denied any blood in the stool or passing clots.  Denied any chest pain no lightheadedness or shortness of breath.  Last dose of Coumadin was this morning family care.  ED Course: Vital signs stable no tachycardia no hypotensive not hypoxic.  Hemoglobin 11.5 compared to baseline fourteen 1 week ago, INR 3.7.  Protonix 1 dose given.  Review of Systems: As per HPI otherwise 14 point review of systems negative.    Past Medical History:  Diagnosis Date   ANXIETY 04/07/2007   Breast cancer of upper-outer quadrant of right female breast ([place]) 01/11/2016   COLONIC POLYPS, HX OF 04/02/2007   adenomatous   DIVERTICULITIS, HX OF 04/02/2007   Diverticulosis    DJD (degenerative joint disease)    Hemorrhoids    HYPERTENSION 04/02/2007   OSTEOARTHRITIS, SEVERE 04/07/2007   Paget's disease of bone    lumbar and sacrum   WEIGHT GAIN 11/13/2007    Past Surgical History:  Procedure Laterality Date   APPENDECTOMY     BREAST LUMPECTOMY WITH RADIOACTIVE SEED LOCALIZATION Right 02/08/2016   Procedure: RIGHT BREAST LUMPECTOMY WITH RADIOACTIVE SEED LOCALIZATION;  Surgeon: Fanny Skates, MD;  Location: [place];  Service: General;  Laterality: Right;   ORIF FEMUR FRACTURE Left 04/21/2018   Procedure: OPEN REDUCTION INTERNAL FIXATION (ORIF) PERI PROSTHETIC FEMUR FRACTURE;  Surgeon: Leandrew Koyanagi, MD;  Location: [place];  Service: Orthopedics;  Laterality: Left;   TONSILLECTOMY     TOTAL HIP ARTHROPLASTY  1999, left hip    2006 right hip   TUBAL LIGATION       reports that she quit smoking about 43 years ago. Her smoking use included cigarettes. She has a 27.00 pack-year smoking history. She has never used smokeless tobacco. She reports current alcohol use of about 7.0 standard drinks of alcohol per week. She reports that she does not use drugs.  Allergies  Allergen Reactions   Hydrocodone-Acetaminophen Other (See Comments)    UNK reaction   Tramadol     Loss of control    Family History  Problem Relation Age of Onset   Pancreatic cancer Mother 107       d. 92   Cancer Father 34       hodgkin's lymphoma and leukemia; d. 18   Colon cancer Brother        dx. late 68s; smoker   Pancreatic cancer Brother 70       d. 68; smoker   Lung cancer Brother 40       smoker   Heart attack Brother        d. late 59s   Colon cancer Paternal Aunt    Breast cancer  Daughter 43       negative genetic testing in 2016   Skin cancer Daughter        non-melanoma type; +sun exposure   Kidney failure Son 23       +EtOH abuse resulting in liver, kidney, and pancreas failure   Diabetes Maternal Uncle        d. later age   Heart disease Neg Hx        family   Esophageal cancer Neg Hx    Rectal cancer Neg Hx    Stomach cancer Neg Hx    Prior to Admission medications   Medication Sig Start Date End Date Taking? Authorizing Provider  acetaminophen (TYLENOL) 500 MG tablet Take 500 mg by mouth daily.    [provider]  albuterol (VENTOLIN HFA) 108 (90 Base) MCG/ACT inhaler Inhale 2 puffs into the lungs every 6 (six) hours as needed for wheezing or  shortness of breath. 09/11/21   [name], [name], MD  Ascorbic Acid (VITAMIN C) 1000 MG tablet Take 1,000 mg by mouth daily.    [provider]  BIOTIN 5000 PO Take 5,000 mcg by mouth daily.    [provider]  cholecalciferol (VITAMIN D3) 25 MCG (1000 UNIT) tablet Take 1,000 Units by mouth daily.    [provider]  hydrochlorothiazide (MICROZIDE) 12.5 MG capsule TAKE 1 CAPSULE BY MOUTH DAILY. 03/12/22   [name], [name], MD  ipratropium-albuterol (DUONEB) 0.5-2.5 (3) MG/3ML SOLN Take 3 mLs by nebulization every 6 (six) hours as needed. 08/15/21   Shelly Coss, MD  metoprolol tartrate (LOPRESSOR) 25 MG tablet TAKE 1 TABLET BY MOUTH DAILY. 02/08/22   [name], MD  Multiple Vitamins-Minerals (CENTRUM SILVER ADULT 50+) TABS Take 1 tablet by mouth daily.    [provider]  Propylene Glycol (SYSTANE BALANCE) 0.6 % SOLN Place 1 drop into both eyes daily as needed (dry eyes).    [provider]  sertraline (ZOLOFT) 50 MG tablet Take 1 tablet (50 mg total) by mouth daily. 05/10/21   [name], [name], MD  vitamin B-12 (CYANOCOBALAMIN) 1000 MCG tablet Take 1,000 mcg by mouth daily.    [provider]  warfarin (COUMADIN) 5 MG tablet Take 1 to 2 tablets by mouth daily as directed by the coumadin clinic 01/10/22   [name], MD    Physical Exam: Vitals:   05/07/22 1045 05/07/22 1100 05/07/22 1130 05/07/22 1200  BP: 118/63 (!) 121/53 (!) 131/55 (!) 112/55  Pulse: 65 66 61 65  Resp: '16 19 17 17  '$ Temp:      SpO2: 93% 91% 90% 92%  Weight:      Height:        Constitutional: NAD, calm, comfortable Vitals:   05/07/22 1045 05/07/22 1100 05/07/22 1130 05/07/22 1200  BP: 118/63 (!) 121/53 (!) 131/55 (!) 112/55  Pulse: 65 66 61 65  Resp: '16 19 17 17  '$ Temp:      SpO2: 93% 91% 90% 92%  Weight:      Height:       Eyes: PERRL, lids and conjunctivae normal ENMT: Mucous membranes are moist. Posterior pharynx clear of any exudate or lesions.Normal  dentition.  Neck: normal, supple, no masses, no thyromegaly Respiratory: clear to auscultation bilaterally, no wheezing, no crackles. Normal respiratory effort. No accessory muscle use.  Cardiovascular: Regular rate and rhythm, no murmurs / rubs / gallops. No extremity edema. 2+ pedal pulses. No carotid bruits.  Abdomen: no tenderness, no masses palpated.  No hepatosplenomegaly. Bowel sounds positive.  Musculoskeletal: no clubbing / cyanosis. No joint deformity upper and lower extremities. Good ROM, no contractures. Normal muscle tone.  Skin: no rashes, lesions, ulcers. No induration Neurologic: CN 2-12 grossly intact. Sensation intact, DTR normal. Strength 5/5 in all 4.  Psychiatric: Normal judgment and insight. Alert and oriented x 3. Normal mood.     Labs on Admission: I have personally reviewed following labs and imaging studies  CBC: Recent Labs  Lab 05/07/22 1030  WBC 10.6*  NEUTROABS 8.6*  HGB 11.5*  HCT 34.8*  MCV 91.1  PLT 109   Basic Metabolic Panel: Recent Labs  Lab 05/07/22 1030  NA 140  K 4.0  CL 108  CO2 29  GLUCOSE 117*  BUN 24*  CREATININE 0.81  CALCIUM 8.9   GFR: Estimated Creatinine Clearance: 43.7 mL/min (by C-G formula based on SCr of 0.81 mg/dL). Liver Function Tests: Recent Labs  Lab 05/07/22 1030  AST 21  ALT 14  ALKPHOS 209*  BILITOT 0.4  PROT 6.2*  ALBUMIN 3.5   Recent Labs  Lab 05/07/22 1030  LIPASE 39   No results for input(s): "AMMONIA" in the last 168 hours. Coagulation Profile: Recent Labs  Lab 05/07/22 1030  INR 3.7*   Cardiac Enzymes: No results for input(s): "CKTOTAL", "CKMB", "CKMBINDEX", "TROPONINI" in the last 168 hours. BNP (last 3 results) No results for input(s): "PROBNP" in the last 8760 hours. HbA1C: No results for input(s): "HGBA1C" in the last 72 hours. CBG: No results for input(s): "GLUCAP" in the last 168 hours. Lipid Profile: No results for input(s): "CHOL", "HDL", "LDLCALC", "TRIG", "CHOLHDL",  "LDLDIRECT" in the last 72 hours. Thyroid Function Tests: No results for input(s): "TSH", "T4TOTAL", "FREET4", "T3FREE", "THYROIDAB" in the last 72 hours. Anemia Panel: No results for input(s): "VITAMINB12", "FOLATE", "FERRITIN", "TIBC", "IRON", "RETICCTPCT" in the last 72 hours. Urine analysis:    Component Value Date/Time   BILIRUBINUR n 10/06/2015 1034   PROTEINUR 1+ 10/06/2015 1034   UROBILINOGEN 1.0 10/06/2015 1034   NITRITE n 10/06/2015 1034   LEUKOCYTESUR Negative 10/06/2015 1034    Radiological Exams on Admission: No results found.  EKG: Pending  Assessment/Plan Principal Problem:   GI bleed Active Problems:   A-fib (HCC)  (please populate well all problems here in Problem List. (For example, if patient is on BP meds at home and you resume or decide to hold them, it is a problem that needs to be her. Same for CAD, COPD, HLD and so on)  Acute-subacute blood loss anemia -Suspect upper GI bleed, guaiac test positive in the ED. -History of diverticulitis but denies any colon bleeding before. -Continue PPI twice daily -Repeat hemoglobin level tonight and tomorrow morning, transfuse for hemodynamic instability -[facility] consulted in the ED -N.p.o. after midnight. -Hold off Coumadin and 1 dose of vitamin K given. -Study and reticulocyte count  Coumadin induced coagulopathy -As patient maintains her blood pressure, will not give FFP.  1 dose of vitamin K given.  Recheck INR tomorrow.  PAF -In sinus sinus rhythm -Hold off Coumadin -Change metoprolol to as needed till GI bleed stabilized.  HTN -Hold off Metoprolol  DVT prophylaxis: SCD Code Status: DNR Family Communication: Daughter at bedside Disposition Plan: Patient sick with new onset of GI bleed while on Coumadin therapy, expect inpatient endoscopy and inpatient GI consult, expect more than 2 midnight hospital stay. Consults called: [facility] Admission status: Telemetry admission   Lequita Halt MD [facility] Pager [phone]  05/07/2022, 1:33 PM

## 2022-05-07 NOTE — ED Triage Notes (Signed)
Pt BIB [facility] from home, EMS reported pt been having black stool since the day after thanksgiving. She's been having this for 10 days now, hoping it will clear up. Pt denies, abdominal pain, weakness, dizziness, but she has been having diarrhea and solid stool. She is on Coumadin. She stated she had black stools this morning.

## 2022-05-07 NOTE — H&P (View-Only) (Signed)
Consultation Note   Referring Provider:  [facility] PCP: [name], [name], MD Primary Gastroenterologist:  Reason for consultation: GI bleed  Hospital Day: 1   Attending physician's note  I have taken a history, reviewed the chart and examined the patient. I performed a substantive portion of this encounter, including complete performance of at least one of the key components, in conjunction with the APP. I agree with the APP's note, impression and recommendations.    88 year old very pleasant female with history of chronic A-fib on Coumadin presented with melena and decline in hemoglobin INR elevated at 3.7 Hold Coumadin Monitor hemoglobin and transfuse if below 7  PPI IV twice daily Advance diet as tolerated  Will plan for EGD for evaluation of melena, possible source of upper GI bleed once INR trends below 2, will tentatively plan to do procedure on Wednesday  The patient was provided an opportunity to ask questions and all were answered. The patient agreed with the plan and demonstrated an understanding of the instructions.  Damaris Hippo , MD [phone]     Assessment    Patient profile:  Teresa Frey is a 88 y.o. female with a past medical history significant for AtFIB on warfarin, HTN, colon polyps, FMH of colon cancer, chronically elevated alk phos  . See PMH for any additional medical problems. Admitted with several days of melena  # 88 yo female with several days of melena on warfarin with INR of 3.7. Hgb 11.5, down from 14 a week ago. DDx: PUD, AVMs, intestinal neoplasm less likely. Consider right colonic bleed Hemodynamically stable.   # Acute blood loss anemia. Hgb 11.5, down from 15 a week ago.   # Afib, on warfarin at home.  INR 3.7 today.   # Chronically elevated alk phos, stable. Remainder of liver chemistries normal.   # History of adenomatous colon polyps, none on last colonoscopy in 2016  # See  PMH for additional medical problems  Plan   One dose of oral Vitamin K ordered by admitting team. . Continue PPI Infusion She is going to need an EGD when INR acceptable. The risks and benefits of EGD with possible biopsies were discussed with the patient who agrees to proceed.  Doubt one dose of vit K will get INR low enough for EGD tomorrow but will keep NPO after MN just in case Trend hgb    HPI   Teresa Frey was brought to ED today for evaluation of black stool over the last several days. Stools have been both solid and loose at times but frequency of bowel movements hasn't changed. She is on warfarin. She doesn't take NSAIDS. She has no GI complaints such as abdominal pain, nausea / vomiting or weight loss. She doesn't take oral or bismuth.   Significant studies:    Hgb 11.5, down from 14 on 11/27 MCV 91 BUN 24 Alk phos 209 INR 3.7   Previous GI Evaluation     2016 polyp surveillance colonoscopy  --moderate diverticulosis of sigmoid colon.  --no polyps   Recent Labs and Imaging No results found.  Labs:  Recent Labs    05/07/22 1030  WBC 10.6*  HGB 11.5*  HCT 34.8*  PLT 186  Recent Labs    05/07/22 1030  NA 140  K 4.0  CL 108  CO2 29  GLUCOSE 117*  BUN 24*  CREATININE 0.81  CALCIUM 8.9   Recent Labs    05/07/22 1030  PROT 6.2*  ALBUMIN 3.5  AST 21  ALT 14  ALKPHOS 209*  BILITOT 0.4   No results for input(s): "HEPBSAG", "HCVAB", "HEPAIGM", "HEPBIGM" in the last 72 hours. Recent Labs    05/07/22 1030  LABPROT 36.5*  INR 3.7*    Past Medical History:  Diagnosis Date   ANXIETY 04/07/2007   Breast cancer of upper-outer quadrant of right female breast ([place]) 01/11/2016   COLONIC POLYPS, HX OF 04/02/2007   adenomatous   DIVERTICULITIS, HX OF 04/02/2007   Diverticulosis    DJD (degenerative joint disease)    Hemorrhoids    HYPERTENSION 04/02/2007   OSTEOARTHRITIS, SEVERE 04/07/2007   Paget's disease of bone    lumbar and sacrum   WEIGHT GAIN  11/13/2007    Past Surgical History:  Procedure Laterality Date   APPENDECTOMY     BREAST LUMPECTOMY WITH RADIOACTIVE SEED LOCALIZATION Right 02/08/2016   Procedure: RIGHT BREAST LUMPECTOMY WITH RADIOACTIVE SEED LOCALIZATION;  Surgeon: Fanny Skates, MD;  Location: [facility];  Service: General;  Laterality: Right;   ORIF FEMUR FRACTURE Left 04/21/2018   Procedure: OPEN REDUCTION INTERNAL FIXATION (ORIF) PERI PROSTHETIC FEMUR FRACTURE;  Surgeon: Leandrew Koyanagi, MD;  Location: [facility];  Service: Orthopedics;  Laterality: Left;   TONSILLECTOMY     TOTAL HIP ARTHROPLASTY  1999, left hip    2006 right hip   TUBAL LIGATION      Family History  Problem Relation Age of Onset   Pancreatic cancer Mother 85       d. 47   Cancer Father 1       hodgkin's lymphoma and leukemia; d. 41   Colon cancer Brother        dx. late 45s; smoker   Pancreatic cancer Brother 94       d. 74; smoker   Lung cancer Brother 66       smoker   Heart attack Brother        d. late 45s   Colon cancer Paternal Aunt    Breast cancer Daughter 8       negative genetic testing in 2016   Skin cancer Daughter        non-melanoma type; +sun exposure   Kidney failure Son 72       +EtOH abuse resulting in liver, kidney, and pancreas failure   Diabetes Maternal Uncle        d. later age   Heart disease Neg Hx        family   Esophageal cancer Neg Hx    Rectal cancer Neg Hx    Stomach cancer Neg Hx     Prior to Admission medications   Medication Sig Start Date End Date Taking? Authorizing Provider  acetaminophen (TYLENOL) 500 MG tablet Take 500 mg by mouth daily.   Yes [provider]  albuterol (VENTOLIN HFA) 108 (90 Base) MCG/ACT inhaler Inhale 2 puffs into the lungs every 6 (six) hours as needed for wheezing or shortness of breath. 09/11/21  Yes [name], [name], MD  Ascorbic Acid (VITAMIN C) 1000 MG tablet Take 1,000 mg by mouth daily.   Yes [provider]  BIOTIN 5000 PO Take  5,000 mcg by mouth daily.   Yes [provider]  cholecalciferol (VITAMIN D3) 25 MCG (1000 UNIT) tablet Take 1,000 Units by mouth daily.   Yes [provider]  hydrochlorothiazide (MICROZIDE) 12.5 MG capsule TAKE 1 CAPSULE BY MOUTH DAILY. Patient taking differently: Take 12.5 mg by mouth daily. 03/12/22  Yes [name], [name], MD  ipratropium-albuterol (DUONEB) 0.5-2.5 (3) MG/3ML SOLN Take 3 mLs by nebulization every 6 (six) hours as needed. Patient taking differently: Take 3 mLs by nebulization every 6 (six) hours as needed (wheezing or shortness of breath). 08/15/21  Yes Shelly Coss, MD  metoprolol tartrate (LOPRESSOR) 25 MG tablet TAKE 1 TABLET BY MOUTH DAILY. Patient taking differently: Take 25 mg by mouth daily. 02/08/22  Yes [name], MD  Multiple Vitamins-Minerals (CENTRUM SILVER ADULT 50+) TABS Take 1 tablet by mouth daily.   Yes [provider]  Propylene Glycol (SYSTANE BALANCE) 0.6 % SOLN Place 1 drop into both eyes daily as needed (dry eyes).   Yes [provider]  sertraline (ZOLOFT) 50 MG tablet Take 1 tablet (50 mg total) by mouth daily. 05/10/21  Yes [name], [name], MD  vitamin B-12 (CYANOCOBALAMIN) 1000 MCG tablet Take 1,000 mcg by mouth daily.   Yes [provider]  warfarin (COUMADIN) 5 MG tablet Take 1 to 2 tablets by mouth daily as directed by the coumadin clinic Patient taking differently: Take 7.5-10 mg by mouth See admin instructions. Take 77m on Mondays and Fridays. Take 7.519mon all other days. 01/10/22  Yes HoMinus BreedingMD    Current Facility-Administered Medications  Medication Dose Route Frequency Provider Last Rate Last Admin   acetaminophen (TYLENOL) tablet 650 mg  650 mg Oral Q6H PRN ZhLequita HaltMD       Or   acetaminophen (TYLENOL) suppository 650 mg  650 mg Rectal Q6H PRN ZhWynetta Fines, MD       albuterol (PROVENTIL) (2.5 MG/3ML) 0.083% nebulizer solution 2.5 mg  2.5 mg Nebulization Q6H PRN ZhWynetta Fines,  MD       ipratropium-albuterol (DUONEB) 0.5-2.5 (3) MG/3ML nebulizer solution 3 mL  3 mL Nebulization Q6H PRN ZhWynetta Fines, MD       metoprolol tartrate (LOPRESSOR) injection 2.5 mg  2.5 mg Intravenous Q6H PRN ZhLequita HaltMD       ondansetron ([facility] 4 mg  4 mg Oral Q6H PRN ZhLequita HaltMD       Or   ondansetron ([facility] 4 mg  4 mg Intravenous Q6H PRN ZhWynetta Fines, MD       pantoprazole (PROTONIX) EC tablet 40 mg  40 mg Oral BID AC ZhWynetta Fines, MD       phytonadione (VITAMIN K) tablet 5 mg  5 mg Oral Once ZhWynetta Fines, MD       polyvinyl alcohol (LIQUIFILM TEARS) 1.4 % ophthalmic solution 1 drop  1 drop Both Eyes PRN ZhLequita HaltMD       [START ON 05/08/2022] sertraline (ZOLOFT) tablet 50 mg  50 mg Oral Daily ZhWynetta Fines, MD       Current Outpatient Medications  Medication Sig Dispense Refill   acetaminophen (TYLENOL) 500 MG tablet Take 500 mg by mouth daily.     albuterol (VENTOLIN HFA) 108 (90 Base) MCG/ACT inhaler Inhale 2 puffs into the lungs every 6 (six) hours as needed for wheezing or shortness of breath. 18 g 4   Ascorbic Acid (VITAMIN C) 1000 MG tablet Take 1,000 mg by mouth daily.  BIOTIN 5000 PO Take 5,000 mcg by mouth daily.     cholecalciferol (VITAMIN D3) 25 MCG (1000 UNIT) tablet Take 1,000 Units by mouth daily.     hydrochlorothiazide (MICROZIDE) 12.5 MG capsule TAKE 1 CAPSULE BY MOUTH DAILY. (Patient taking differently: Take 12.5 mg by mouth daily.) 90 capsule 1   ipratropium-albuterol (DUONEB) 0.5-2.5 (3) MG/3ML SOLN Take 3 mLs by nebulization every 6 (six) hours as needed. (Patient taking differently: Take 3 mLs by nebulization every 6 (six) hours as needed (wheezing or shortness of breath).) 360 mL 1   metoprolol tartrate (LOPRESSOR) 25 MG tablet TAKE 1 TABLET BY MOUTH DAILY. (Patient taking differently: Take 25 mg by mouth daily.) 90 tablet 4   Multiple Vitamins-Minerals (CENTRUM SILVER ADULT 50+) TABS Take 1 tablet by mouth daily.      Propylene Glycol (SYSTANE BALANCE) 0.6 % SOLN Place 1 drop into both eyes daily as needed (dry eyes).     sertraline (ZOLOFT) 50 MG tablet Take 1 tablet (50 mg total) by mouth daily. 90 tablet 3   vitamin B-12 (CYANOCOBALAMIN) 1000 MCG tablet Take 1,000 mcg by mouth daily.     warfarin (COUMADIN) 5 MG tablet Take 1 to 2 tablets by mouth daily as directed by the coumadin clinic (Patient taking differently: Take 7.5-10 mg by mouth See admin instructions. Take 7m on Mondays and Fridays. Take 7.555mon all other days.) 160 tablet 1    Allergies as of 05/07/2022 - Review Complete 05/07/2022  Allergen Reaction Noted   Hydrocodone-acetaminophen Other (See Comments) 04/12/2019   Tramadol  02/24/2018    Social History   Socioeconomic History   Marital status: Married    Spouse name: Not on file   Number of children: 2   Years of education: Not on file   Highest education level: Not on file  Occupational History   Occupation: retired  Tobacco Use   Smoking status: Former    Packs/day: 1.00    Years: 27.00    Total pack years: 27.00    Types: Cigarettes    Quit date: 06/04/1978    Years since quitting: 43.9   Smokeless tobacco: Never  Vaping Use   Vaping Use: Never used  Substance and Sexual Activity   Alcohol use: Yes    Alcohol/week: 7.0 standard drinks of alcohol    Types: 7 Glasses of wine per week    Comment: 1-2 glasses wine after dinner, but not necessarily each day   Drug use: No   Sexual activity: Not on file  Other Topics Concern   Not on file  Social History Narrative   Not on file   Social Determinants of Health   Financial Resource Strain: Low Risk  (10/23/2021)   Overall Financial Resource Strain (CARDIA)    Difficulty of Paying Living Expenses: Not hard at all  Food Insecurity: No Food Insecurity (10/23/2021)   Hunger Vital Sign    Worried About Running Out of Food in the Last Year: Never true    Ran Out of Food in the Last Year: Never true  Transportation  Needs: No Transportation Needs (10/23/2021)   PRAPARE - TrHydrologistMedical): No    Lack of Transportation (Non-Medical): No  Physical Activity: Insufficiently Active (10/23/2021)   Exercise Vital Sign    Days of Exercise per Week: 5 days    Minutes of Exercise per Session: 20 min  Stress: No Stress Concern Present (10/23/2021)   [employer]  Occupational Stress Questionnaire    Feeling of Stress : Not at all  Social Connections: Moderately Integrated (10/23/2021)   Social Connection and Isolation Panel [NHANES]    Frequency of Communication with Friends and Family: More than three times a week    Frequency of Social Gatherings with Friends and Family: More than three times a week    Attends Religious Services: More than 4 times per year    Active Member of [employer] or Organizations: Yes    Attends Archivist Meetings: More than 4 times per year    Marital Status: Widowed  Intimate Partner Violence: Not At Risk (10/23/2021)   Humiliation, Afraid, Rape, and Kick questionnaire    Fear of Current or Ex-Partner: No    Emotionally Abused: No    Physically Abused: No    Sexually Abused: No    Review of Systems: All systems reviewed and negative except where noted in HPI.  Physical Exam: Vital signs in last 24 hours: Temp:  [98.5 F (36.9 C)] 98.5 F (36.9 C) (12/04 0931) Pulse Rate:  [61-70] 65 (12/04 1200) Resp:  [16-19] 17 (12/04 1200) BP: (112-131)/(53-99) 112/55 (12/04 1200) SpO2:  [90 %-95 %] 92 % (12/04 1200) Weight:  [74.8 kg] 74.8 kg (12/04 0953)    General:  Alert female in NAD Psych:  Pleasant, cooperative. Normal mood and affect Eyes: Pupils equal Ears:  Normal auditory acuity Nose: No deformity, discharge or lesions Neck:  Supple, no masses felt Lungs:  Clear to auscultation.  Heart:  Regular rate, regular rhythm. No lower extremity edema Abdomen:  Soft, nondistended, nontender, active bowel sounds,  no masses felt Rectal :  Deferred Msk: Symmetrical without gross deformities.  Neurologic:  Alert, oriented, grossly normal neurologically Skin:  Intact without significant lesions.    Intake/Output from previous day: No intake/output data recorded. Intake/Output this shift:  No intake/output data recorded.    Principal Problem:   GI bleed Active Problems:   A-fib ([place])    Tye Savoy, NP-C @  05/07/2022, 2:00 PM

## 2022-05-07 NOTE — ED Provider Notes (Signed)
[facility] EMERGENCY DEPARTMENT Provider Note  CSN: [id number] Arrival date & time: 05/07/22 7829  Chief Complaint(s) Dark Stools  HPI Teresa Frey is a 88 y.o. female with history of diverticulosis, hypertension, COPD, A-fib on warfarin to the emergency department with dark stools.  Patient reports around 10 days of dark stools, intermittent diarrhea.  She otherwise denies any nausea, vomiting, abdominal pain, fevers or chills, lightheadedness, dizziness, syncope.  She reports that the dark stools are intermittent as well.  She reports compliance with her warfarin.   Past Medical History Past Medical History:  Diagnosis Date   ANXIETY 04/07/2007   Breast cancer of upper-outer quadrant of right female breast ([place]) 01/11/2016   COLONIC POLYPS, HX OF 04/02/2007   adenomatous   DIVERTICULITIS, HX OF 04/02/2007   Diverticulosis    DJD (degenerative joint disease)    Hemorrhoids    HYPERTENSION 04/02/2007   OSTEOARTHRITIS, SEVERE 04/07/2007   Paget's disease of bone    lumbar and sacrum   WEIGHT GAIN 11/13/2007   Patient Active Problem List   Diagnosis Date Noted   GI bleed 05/07/2022   Leukocytosis 08/13/2021   Physical debility 08/12/2021   COPD exacerbation ([place]) 08/12/2021   COPD with acute exacerbation ([place]) 08/11/2021   DNR (do not resuscitate) 08/11/2021   Atrial fibrillation with RVR ([place]) 09/28/2020   Multiple falls 11/13/2019   COPD (chronic obstructive pulmonary disease) ([place]) 09/21/2019   Educated about COVID-19 virus infection 09/09/2019   Spondylosis without myelopathy or radiculopathy, lumbar region 02/12/2019   Spondylolisthesis of lumbar region 02/12/2019   Pain of left hip joint 12/23/2018   Primary osteoarthritis of left knee 12/23/2018   Periprosthetic fracture around internal prosthetic left hip joint ([place]) 04/19/2018   Long term (current) use of anticoagulants [Z79.01] 08/14/2017   Genetic testing 04/01/2016   possible sleep apnea by  history 02/12/2016   A-fib ([place]) 02/11/2016   Surgical wound infection 02/11/2016   Breast cancer of upper-outer quadrant of right female breast ([place]) 01/11/2016   Impaired glucose tolerance 07/28/2014   Anxiety disorder, unspecified 04/07/2007   Osteoarthritis 04/07/2007   Essential hypertension 04/02/2007   History of colonic polyps 04/02/2007   DIVERTICULITIS, HX OF 04/02/2007   Home Medication(s) Prior to Admission medications   Medication Sig Start Date End Date Taking? Authorizing Provider  acetaminophen (TYLENOL) 500 MG tablet Take 500 mg by mouth daily.    [provider]  albuterol (VENTOLIN HFA) 108 (90 Base) MCG/ACT inhaler Inhale 2 puffs into the lungs every 6 (six) hours as needed for wheezing or shortness of breath. 09/11/21   [name], [name], MD  Ascorbic Acid (VITAMIN C) 1000 MG tablet Take 1,000 mg by mouth daily.    [provider]  BIOTIN 5000 PO Take 5,000 mcg by mouth daily.    [provider]  cholecalciferol (VITAMIN D3) 25 MCG (1000 UNIT) tablet Take 1,000 Units by mouth daily.    [provider]  hydrochlorothiazide (MICROZIDE) 12.5 MG capsule TAKE 1 CAPSULE BY MOUTH DAILY. 03/12/22   [name], [name], MD  ipratropium-albuterol (DUONEB) 0.5-2.5 (3) MG/3ML SOLN Take 3 mLs by nebulization every 6 (six) hours as needed. 08/15/21   Shelly Coss, MD  metoprolol tartrate (LOPRESSOR) 25 MG tablet TAKE 1 TABLET BY MOUTH DAILY. 02/08/22   [name], MD  Multiple Vitamins-Minerals (CENTRUM SILVER ADULT 50+) TABS Take 1 tablet by mouth daily.    [provider]  Propylene Glycol (SYSTANE BALANCE) 0.6 % SOLN Place 1  drop into both eyes daily as needed (dry eyes).    [provider]  sertraline (ZOLOFT) 50 MG tablet Take 1 tablet (50 mg total) by mouth daily. 05/10/21   [name], [name], MD  vitamin B-12 (CYANOCOBALAMIN) 1000 MCG tablet Take 1,000 mcg by mouth daily.    [provider]  warfarin (COUMADIN) 5 MG  tablet Take 1 to 2 tablets by mouth daily as directed by the coumadin clinic 01/10/22   [name], MD                                                                                                                                    Past Surgical History Past Surgical History:  Procedure Laterality Date   APPENDECTOMY     BREAST LUMPECTOMY WITH RADIOACTIVE SEED LOCALIZATION Right 02/08/2016   Procedure: RIGHT BREAST LUMPECTOMY WITH RADIOACTIVE SEED LOCALIZATION;  Surgeon: Fanny Skates, MD;  Location: [facility];  Service: General;  Laterality: Right;   ORIF FEMUR FRACTURE Left 04/21/2018   Procedure: OPEN REDUCTION INTERNAL FIXATION (ORIF) PERI PROSTHETIC FEMUR FRACTURE;  Surgeon: Leandrew Koyanagi, MD;  Location: [facility];  Service: Orthopedics;  Laterality: Left;   TONSILLECTOMY     TOTAL HIP ARTHROPLASTY  1999, left hip    2006 right hip   TUBAL LIGATION     Family History Family History  Problem Relation Age of Onset   Pancreatic cancer Mother 38       d. 30   Cancer Father 43       hodgkin's lymphoma and leukemia; d. 33   Colon cancer Brother        dx. late 66s; smoker   Pancreatic cancer Brother 72       d. 11; smoker   Lung cancer Brother 31       smoker   Heart attack Brother        d. late 72s   Colon cancer Paternal Aunt    Breast cancer Daughter 81       negative genetic testing in 2016   Skin cancer Daughter        non-melanoma type; +sun exposure   Kidney failure Son 64       +EtOH abuse resulting in liver, kidney, and pancreas failure   Diabetes Maternal Uncle        d. later age   Heart disease Neg Hx        family   Esophageal cancer Neg Hx    Rectal cancer Neg Hx    Stomach cancer Neg Hx     Social History Social History   Tobacco Use   Smoking status: Former    Packs/day: 1.00    Years: 27.00    Total pack years: 27.00    Types: Cigarettes    Quit date: 06/04/1978    Years since quitting: 43.9   Smokeless tobacco: Never  Vaping  Use  Vaping Use: Never used  Substance Use Topics   Alcohol use: Yes    Alcohol/week: 7.0 standard drinks of alcohol    Types: 7 Glasses of wine per week    Comment: 1-2 glasses wine after dinner, but not necessarily each day   Drug use: No   Allergies Hydrocodone-acetaminophen and Tramadol  Review of Systems Review of Systems  All other systems reviewed and are negative.   Physical Exam Vital Signs  I have reviewed the triage vital signs BP (!) 112/55   Pulse 65   Temp 98.5 F (36.9 C)   Resp 17   Ht '5\' 2"'$  (1.575 m)   Wt 74.8 kg   SpO2 92%   BMI 30.18 kg/m  Physical Exam Vitals and nursing note reviewed.  Constitutional:      General: She is not in acute distress.    Appearance: She is well-developed.  HENT:     Head: Normocephalic and atraumatic.     Mouth/Throat:     Mouth: Mucous membranes are moist.  Eyes:     Pupils: Pupils are equal, round, and reactive to light.  Cardiovascular:     Rate and Rhythm: Normal rate and regular rhythm.     Heart sounds: No murmur heard. Pulmonary:     Effort: Pulmonary effort is normal. No respiratory distress.     Breath sounds: Normal breath sounds.  Abdominal:     General: Abdomen is flat.     Palpations: Abdomen is soft.     Tenderness: There is no abdominal tenderness.  Genitourinary:    Comments: Chaperoned by RN Justice Rocher, black stool present in rectal vault Musculoskeletal:        General: No tenderness.     Right lower leg: No edema.     Left lower leg: No edema.  Skin:    General: Skin is warm and dry.  Neurological:     General: No focal deficit present.     Mental Status: She is alert. Mental status is at baseline.  Psychiatric:        Mood and Affect: Mood normal.        Behavior: Behavior normal.     ED Results and Treatments Labs (all labs ordered are listed, but only abnormal results are displayed) Labs Reviewed  COMPREHENSIVE METABOLIC PANEL - Abnormal; Notable for the following components:       Result Value   Glucose, Bld 117 (*)    BUN 24 (*)    Total Protein 6.2 (*)    Alkaline Phosphatase 209 (*)    Anion gap 3 (*)    All other components within normal limits  CBC WITH DIFFERENTIAL/PLATELET - Abnormal; Notable for the following components:   WBC 10.6 (*)    RBC 3.82 (*)    Hemoglobin 11.5 (*)    HCT 34.8 (*)    Neutro Abs 8.6 (*)    All other components within normal limits  PROTIME-INR - Abnormal; Notable for the following components:   Prothrombin Time 36.5 (*)    INR 3.7 (*)    All other components within normal limits  POC OCCULT BLOOD, ED - Abnormal; Notable for the following components:   Fecal Occult Bld POSITIVE (*)    All other components within normal limits  LIPASE, BLOOD  HEMOGLOBIN AND HEMATOCRIT, BLOOD  TYPE AND SCREEN  Radiology No results found.  Pertinent labs & imaging results that were available during my care of the patient were reviewed by me and considered in my medical decision making (see MDM for details).  Medications Ordered in ED Medications  sertraline (ZOLOFT) tablet 50 mg (has no administration in time range)  albuterol (VENTOLIN HFA) 108 (90 Base) MCG/ACT inhaler 2 puff (has no administration in time range)  ipratropium-albuterol (DUONEB) 0.5-2.5 (3) MG/3ML nebulizer solution 3 mL (has no administration in time range)  Propylene Glycol 0.6 % SOLN 1 drop (has no administration in time range)  phytonadione (VITAMIN K) tablet 5 mg (has no administration in time range)  metoprolol tartrate (LOPRESSOR) injection 2.5 mg (has no administration in time range)  acetaminophen (TYLENOL) tablet 650 mg (has no administration in time range)    Or  acetaminophen (TYLENOL) suppository 650 mg (has no administration in time range)  ondansetron (ZOFRAN) tablet 4 mg (has no administration in time range)    Or  ondansetron  (ZOFRAN) injection 4 mg (has no administration in time range)  sodium chloride 0.9 % bolus 1,000 mL (1,000 mLs Intravenous New Bag/Given 05/07/22 1043)  pantoprazole (PROTONIX) 80 mg /NS 100 mL IVPB (80 mg Intravenous New Bag/Given 05/07/22 1236)                                                                                                                                     Procedures Procedures  (including critical care time)  Medical Decision Making / ED Course   MDM:  88 year old female presenting to the emergency department with dark stool.  Patient overall well-appearing, vital signs reassuring.  No abdominal tenderness on exam.  Differential includes upper GI bleeding from ulcer, erosion.  Less likely variceal bleeding, no stigmata of cirrhosis.  Less likely lower GI bleeding given black stool.  Patient denies taking any dietary supplements such as iron or Pepto-Bismol which may cause dark stools.  Will check INR as patient is on warfarin.  Patient may need GI consult for further evaluation  Clinical Course as of 05/07/22 1258  Mon May 07, 2022  1228 Discussed with [facility] GI team and Dr. Markus Jarvis. Dr. Markus Jarvis will admit. Gave PPI for GI bleed. INR mildly supratherapeutic  [WS]    Clinical Course User Index [WS] Cristie Hem, MD     Additional history obtained: -Additional history obtained from ems -External records from outside source obtained and reviewed including: Chart review including previous notes, labs, imaging, consultation notes including prior hgb    Lab Tests: -I ordered, reviewed, and interpreted labs.   The pertinent results include:   Labs Reviewed  COMPREHENSIVE METABOLIC PANEL - Abnormal; Notable for the following components:      Result Value   Glucose, Bld 117 (*)    BUN 24 (*)    Total Protein 6.2 (*)    Alkaline Phosphatase 209 (*)    Anion gap 3 (*)  All other components within normal limits  CBC WITH DIFFERENTIAL/PLATELET - Abnormal;  Notable for the following components:   WBC 10.6 (*)    RBC 3.82 (*)    Hemoglobin 11.5 (*)    HCT 34.8 (*)    Neutro Abs 8.6 (*)    All other components within normal limits  PROTIME-INR - Abnormal; Notable for the following components:   Prothrombin Time 36.5 (*)    INR 3.7 (*)    All other components within normal limits  POC OCCULT BLOOD, ED - Abnormal; Notable for the following components:   Fecal Occult Bld POSITIVE (*)    All other components within normal limits  LIPASE, BLOOD  HEMOGLOBIN AND HEMATOCRIT, BLOOD  TYPE AND SCREEN    Notable for mild anemia, supratherapeutic INR, BUN elevation suggestive of GI bleed   Medicines ordered and prescription drug management: Meds ordered this encounter  Medications   sodium chloride 0.9 % bolus 1,000 mL   pantoprazole (PROTONIX) 80 mg /NS 100 mL IVPB   DISCONTD: acetaminophen (TYLENOL) tablet 500 mg   sertraline (ZOLOFT) tablet 50 mg   albuterol (VENTOLIN HFA) 108 (90 Base) MCG/ACT inhaler 2 puff   ipratropium-albuterol (DUONEB) 0.5-2.5 (3) MG/3ML nebulizer solution 3 mL   Propylene Glycol 0.6 % SOLN 1 drop   phytonadione (VITAMIN K) tablet 5 mg   metoprolol tartrate (LOPRESSOR) injection 2.5 mg   OR Linked Order Group    acetaminophen (TYLENOL) tablet 650 mg    acetaminophen (TYLENOL) suppository 650 mg   OR Linked Order Group    ondansetron (ZOFRAN) tablet 4 mg    ondansetron (ZOFRAN) injection 4 mg    -I have reviewed the patients home medicines and have made adjustments as needed   Consultations Obtained: I requested consultation with the gastroenterologist,  and discussed lab and imaging findings as well as pertinent plan - they recommend: they will consult   Cardiac Monitoring: The patient was maintained on a cardiac monitor.  I personally viewed and interpreted the cardiac monitored which showed an underlying rhythm of: NSR  Reevaluation: After the interventions noted above, I reevaluated the patient and found  that they have improved  Co morbidities that complicate the patient evaluation  Past Medical History:  Diagnosis Date   ANXIETY 04/07/2007   Breast cancer of upper-outer quadrant of right female breast ([place]) 01/11/2016   COLONIC POLYPS, HX OF 04/02/2007   adenomatous   DIVERTICULITIS, HX OF 04/02/2007   Diverticulosis    DJD (degenerative joint disease)    Hemorrhoids    HYPERTENSION 04/02/2007   OSTEOARTHRITIS, SEVERE 04/07/2007   Paget's disease of bone    lumbar and sacrum   WEIGHT GAIN 11/13/2007      Dispostion: Disposition decision including need for hospitalization was considered, and patient admitted to the hospital.    Final Clinical Impression(s) / ED Diagnoses Final diagnoses:  Upper GI bleed     This chart was dictated using voice recognition software.  Despite best efforts to proofread,  errors can occur which can change the documentation meaning.    Cristie Hem, MD 05/07/22 1258

## 2022-05-07 NOTE — ED Notes (Signed)
Report received from [place], [place]

## 2022-05-07 NOTE — Consult Note (Addendum)
Consultation Note   Referring Provider:  [facility] PCP: [name], [name], MD Primary Gastroenterologist:  Reason for consultation: GI bleed  Hospital Day: 1   Attending physician's note  I have taken a history, reviewed the chart and examined the patient. I performed a substantive portion of this encounter, including complete performance of at least one of the key components, in conjunction with the APP. I agree with the APP's note, impression and recommendations.    88 year old very pleasant female with history of chronic A-fib on Coumadin presented with melena and decline in hemoglobin INR elevated at 3.7 Hold Coumadin Monitor hemoglobin and transfuse if below 7  PPI IV twice daily Advance diet as tolerated  Will plan for EGD for evaluation of melena, possible source of upper GI bleed once INR trends below 2, will tentatively plan to do procedure on Wednesday  The patient was provided an opportunity to ask questions and all were answered. The patient agreed with the plan and demonstrated an understanding of the instructions.  Damaris Hippo , MD [phone]     Assessment    Patient profile:  Teresa Frey is a 88 y.o. female with a past medical history significant for AtFIB on warfarin, HTN, colon polyps, FMH of colon cancer, chronically elevated alk phos  . See PMH for any additional medical problems. Admitted with several days of melena  # 88 yo female with several days of melena on warfarin with INR of 3.7. Hgb 11.5, down from 14 a week ago. DDx: PUD, AVMs, intestinal neoplasm less likely. Consider right colonic bleed Hemodynamically stable.   # Acute blood loss anemia. Hgb 11.5, down from 15 a week ago.   # Afib, on warfarin at home.  INR 3.7 today.   # Chronically elevated alk phos, stable. Remainder of liver chemistries normal.   # History of adenomatous colon polyps, none on last colonoscopy in 2016  # See  PMH for additional medical problems  Plan   One dose of oral Vitamin K ordered by admitting team. . Continue PPI Infusion She is going to need an EGD when INR acceptable. The risks and benefits of EGD with possible biopsies were discussed with the patient who agrees to proceed.  Doubt one dose of vit K will get INR low enough for EGD tomorrow but will keep NPO after MN just in case Trend hgb    HPI   Teresa Frey was brought to ED today for evaluation of black stool over the last several days. Stools have been both solid and loose at times but frequency of bowel movements hasn't changed. She is on warfarin. She doesn't take NSAIDS. She has no GI complaints such as abdominal pain, nausea / vomiting or weight loss. She doesn't take oral or bismuth.   Significant studies:    Hgb 11.5, down from 14 on 11/27 MCV 91 BUN 24 Alk phos 209 INR 3.7   Previous GI Evaluation     2016 polyp surveillance colonoscopy  --moderate diverticulosis of sigmoid colon.  --no polyps   Recent Labs and Imaging No results found.  Labs:  Recent Labs    05/07/22 1030  WBC 10.6*  HGB 11.5*  HCT 34.8*  PLT 186  Recent Labs    05/07/22 1030  NA 140  K 4.0  CL 108  CO2 29  GLUCOSE 117*  BUN 24*  CREATININE 0.81  CALCIUM 8.9   Recent Labs    05/07/22 1030  PROT 6.2*  ALBUMIN 3.5  AST 21  ALT 14  ALKPHOS 209*  BILITOT 0.4   No results for input(s): "HEPBSAG", "HCVAB", "HEPAIGM", "HEPBIGM" in the last 72 hours. Recent Labs    05/07/22 1030  LABPROT 36.5*  INR 3.7*    Past Medical History:  Diagnosis Date   ANXIETY 04/07/2007   Breast cancer of upper-outer quadrant of right female breast ([place]) 01/11/2016   COLONIC POLYPS, HX OF 04/02/2007   adenomatous   DIVERTICULITIS, HX OF 04/02/2007   Diverticulosis    DJD (degenerative joint disease)    Hemorrhoids    HYPERTENSION 04/02/2007   OSTEOARTHRITIS, SEVERE 04/07/2007   Paget's disease of bone    lumbar and sacrum   WEIGHT GAIN  11/13/2007    Past Surgical History:  Procedure Laterality Date   APPENDECTOMY     BREAST LUMPECTOMY WITH RADIOACTIVE SEED LOCALIZATION Right 02/08/2016   Procedure: RIGHT BREAST LUMPECTOMY WITH RADIOACTIVE SEED LOCALIZATION;  Surgeon: Fanny Skates, MD;  Location: [facility];  Service: General;  Laterality: Right;   ORIF FEMUR FRACTURE Left 04/21/2018   Procedure: OPEN REDUCTION INTERNAL FIXATION (ORIF) PERI PROSTHETIC FEMUR FRACTURE;  Surgeon: Leandrew Koyanagi, MD;  Location: [facility];  Service: Orthopedics;  Laterality: Left;   TONSILLECTOMY     TOTAL HIP ARTHROPLASTY  1999, left hip    2006 right hip   TUBAL LIGATION      Family History  Problem Relation Age of Onset   Pancreatic cancer Mother 85       d. 47   Cancer Father 1       hodgkin's lymphoma and leukemia; d. 41   Colon cancer Brother        dx. late 45s; smoker   Pancreatic cancer Brother 94       d. 74; smoker   Lung cancer Brother 66       smoker   Heart attack Brother        d. late 45s   Colon cancer Paternal Aunt    Breast cancer Daughter 8       negative genetic testing in 2016   Skin cancer Daughter        non-melanoma type; +sun exposure   Kidney failure Son 72       +EtOH abuse resulting in liver, kidney, and pancreas failure   Diabetes Maternal Uncle        d. later age   Heart disease Neg Hx        family   Esophageal cancer Neg Hx    Rectal cancer Neg Hx    Stomach cancer Neg Hx     Prior to Admission medications   Medication Sig Start Date End Date Taking? Authorizing Provider  acetaminophen (TYLENOL) 500 MG tablet Take 500 mg by mouth daily.   Yes [provider]  albuterol (VENTOLIN HFA) 108 (90 Base) MCG/ACT inhaler Inhale 2 puffs into the lungs every 6 (six) hours as needed for wheezing or shortness of breath. 09/11/21  Yes [name], [name], MD  Ascorbic Acid (VITAMIN C) 1000 MG tablet Take 1,000 mg by mouth daily.   Yes [provider]  BIOTIN 5000 PO Take  5,000 mcg by mouth daily.   Yes [provider]  cholecalciferol (VITAMIN D3) 25 MCG (1000 UNIT) tablet Take 1,000 Units by mouth daily.   Yes [provider]  hydrochlorothiazide (MICROZIDE) 12.5 MG capsule TAKE 1 CAPSULE BY MOUTH DAILY. Patient taking differently: Take 12.5 mg by mouth daily. 03/12/22  Yes [name], [name], MD  ipratropium-albuterol (DUONEB) 0.5-2.5 (3) MG/3ML SOLN Take 3 mLs by nebulization every 6 (six) hours as needed. Patient taking differently: Take 3 mLs by nebulization every 6 (six) hours as needed (wheezing or shortness of breath). 08/15/21  Yes Shelly Coss, MD  metoprolol tartrate (LOPRESSOR) 25 MG tablet TAKE 1 TABLET BY MOUTH DAILY. Patient taking differently: Take 25 mg by mouth daily. 02/08/22  Yes [name], MD  Multiple Vitamins-Minerals (CENTRUM SILVER ADULT 50+) TABS Take 1 tablet by mouth daily.   Yes [provider]  Propylene Glycol (SYSTANE BALANCE) 0.6 % SOLN Place 1 drop into both eyes daily as needed (dry eyes).   Yes [provider]  sertraline (ZOLOFT) 50 MG tablet Take 1 tablet (50 mg total) by mouth daily. 05/10/21  Yes [name], [name], MD  vitamin B-12 (CYANOCOBALAMIN) 1000 MCG tablet Take 1,000 mcg by mouth daily.   Yes [provider]  warfarin (COUMADIN) 5 MG tablet Take 1 to 2 tablets by mouth daily as directed by the coumadin clinic Patient taking differently: Take 7.5-10 mg by mouth See admin instructions. Take 77m on Mondays and Fridays. Take 7.519mon all other days. 01/10/22  Yes HoMinus BreedingMD    Current Facility-Administered Medications  Medication Dose Route Frequency Provider Last Rate Last Admin   acetaminophen (TYLENOL) tablet 650 mg  650 mg Oral Q6H PRN ZhLequita HaltMD       Or   acetaminophen (TYLENOL) suppository 650 mg  650 mg Rectal Q6H PRN ZhWynetta Fines, MD       albuterol (PROVENTIL) (2.5 MG/3ML) 0.083% nebulizer solution 2.5 mg  2.5 mg Nebulization Q6H PRN ZhWynetta Fines,  MD       ipratropium-albuterol (DUONEB) 0.5-2.5 (3) MG/3ML nebulizer solution 3 mL  3 mL Nebulization Q6H PRN ZhWynetta Fines, MD       metoprolol tartrate (LOPRESSOR) injection 2.5 mg  2.5 mg Intravenous Q6H PRN ZhLequita HaltMD       ondansetron ([facility] 4 mg  4 mg Oral Q6H PRN ZhLequita HaltMD       Or   ondansetron ([facility] 4 mg  4 mg Intravenous Q6H PRN ZhWynetta Fines, MD       pantoprazole (PROTONIX) EC tablet 40 mg  40 mg Oral BID AC ZhWynetta Fines, MD       phytonadione (VITAMIN K) tablet 5 mg  5 mg Oral Once ZhWynetta Fines, MD       polyvinyl alcohol (LIQUIFILM TEARS) 1.4 % ophthalmic solution 1 drop  1 drop Both Eyes PRN ZhLequita HaltMD       [START ON 05/08/2022] sertraline (ZOLOFT) tablet 50 mg  50 mg Oral Daily ZhWynetta Fines, MD       Current Outpatient Medications  Medication Sig Dispense Refill   acetaminophen (TYLENOL) 500 MG tablet Take 500 mg by mouth daily.     albuterol (VENTOLIN HFA) 108 (90 Base) MCG/ACT inhaler Inhale 2 puffs into the lungs every 6 (six) hours as needed for wheezing or shortness of breath. 18 g 4   Ascorbic Acid (VITAMIN C) 1000 MG tablet Take 1,000 mg by mouth daily.  BIOTIN 5000 PO Take 5,000 mcg by mouth daily.     cholecalciferol (VITAMIN D3) 25 MCG (1000 UNIT) tablet Take 1,000 Units by mouth daily.     hydrochlorothiazide (MICROZIDE) 12.5 MG capsule TAKE 1 CAPSULE BY MOUTH DAILY. (Patient taking differently: Take 12.5 mg by mouth daily.) 90 capsule 1   ipratropium-albuterol (DUONEB) 0.5-2.5 (3) MG/3ML SOLN Take 3 mLs by nebulization every 6 (six) hours as needed. (Patient taking differently: Take 3 mLs by nebulization every 6 (six) hours as needed (wheezing or shortness of breath).) 360 mL 1   metoprolol tartrate (LOPRESSOR) 25 MG tablet TAKE 1 TABLET BY MOUTH DAILY. (Patient taking differently: Take 25 mg by mouth daily.) 90 tablet 4   Multiple Vitamins-Minerals (CENTRUM SILVER ADULT 50+) TABS Take 1 tablet by mouth daily.      Propylene Glycol (SYSTANE BALANCE) 0.6 % SOLN Place 1 drop into both eyes daily as needed (dry eyes).     sertraline (ZOLOFT) 50 MG tablet Take 1 tablet (50 mg total) by mouth daily. 90 tablet 3   vitamin B-12 (CYANOCOBALAMIN) 1000 MCG tablet Take 1,000 mcg by mouth daily.     warfarin (COUMADIN) 5 MG tablet Take 1 to 2 tablets by mouth daily as directed by the coumadin clinic (Patient taking differently: Take 7.5-10 mg by mouth See admin instructions. Take 14m on Mondays and Fridays. Take 7.552mon all other days.) 160 tablet 1    Allergies as of 05/07/2022 - Review Complete 05/07/2022  Allergen Reaction Noted   Hydrocodone-acetaminophen Other (See Comments) 04/12/2019   Tramadol  02/24/2018    Social History   Socioeconomic History   Marital status: Married    Spouse name: Not on file   Number of children: 2   Years of education: Not on file   Highest education level: Not on file  Occupational History   Occupation: retired  Tobacco Use   Smoking status: Former    Packs/day: 1.00    Years: 27.00    Total pack years: 27.00    Types: Cigarettes    Quit date: 06/04/1978    Years since quitting: 43.9   Smokeless tobacco: Never  Vaping Use   Vaping Use: Never used  Substance and Sexual Activity   Alcohol use: Yes    Alcohol/week: 7.0 standard drinks of alcohol    Types: 7 Glasses of wine per week    Comment: 1-2 glasses wine after dinner, but not necessarily each day   Drug use: No   Sexual activity: Not on file  Other Topics Concern   Not on file  Social History Narrative   Not on file   Social Determinants of Health   Financial Resource Strain: Low Risk  (10/23/2021)   Overall Financial Resource Strain (CARDIA)    Difficulty of Paying Living Expenses: Not hard at all  Food Insecurity: No Food Insecurity (10/23/2021)   Hunger Vital Sign    Worried About Running Out of Food in the Last Year: Never true    Ran Out of Food in the Last Year: Never true  Transportation  Needs: No Transportation Needs (10/23/2021)   PRAPARE - TrHydrologistMedical): No    Lack of Transportation (Non-Medical): No  Physical Activity: Insufficiently Active (10/23/2021)   Exercise Vital Sign    Days of Exercise per Week: 5 days    Minutes of Exercise per Session: 20 min  Stress: No Stress Concern Present (10/23/2021)   [place]  Occupational Stress Questionnaire    Feeling of Stress : Not at all  Social Connections: Moderately Integrated (10/23/2021)   Social Connection and Isolation Panel [NHANES]    Frequency of Communication with Friends and Family: More than three times a week    Frequency of Social Gatherings with Friends and Family: More than three times a week    Attends Religious Services: More than 4 times per year    Active Member of [employer] or Organizations: Yes    Attends Archivist Meetings: More than 4 times per year    Marital Status: Widowed  Intimate Partner Violence: Not At Risk (10/23/2021)   Humiliation, Afraid, Rape, and Kick questionnaire    Fear of Current or Ex-Partner: No    Emotionally Abused: No    Physically Abused: No    Sexually Abused: No    Review of Systems: All systems reviewed and negative except where noted in HPI.  Physical Exam: Vital signs in last 24 hours: Temp:  [98.5 F (36.9 C)] 98.5 F (36.9 C) (12/04 0931) Pulse Rate:  [61-70] 65 (12/04 1200) Resp:  [16-19] 17 (12/04 1200) BP: (112-131)/(53-99) 112/55 (12/04 1200) SpO2:  [90 %-95 %] 92 % (12/04 1200) Weight:  [74.8 kg] 74.8 kg (12/04 0953)    General:  Alert female in NAD Psych:  Pleasant, cooperative. Normal mood and affect Eyes: Pupils equal Ears:  Normal auditory acuity Nose: No deformity, discharge or lesions Neck:  Supple, no masses felt Lungs:  Clear to auscultation.  Heart:  Regular rate, regular rhythm. No lower extremity edema Abdomen:  Soft, nondistended, nontender, active bowel sounds,  no masses felt Rectal :  Deferred Msk: Symmetrical without gross deformities.  Neurologic:  Alert, oriented, grossly normal neurologically Skin:  Intact without significant lesions.    Intake/Output from previous day: No intake/output data recorded. Intake/Output this shift:  No intake/output data recorded.    Principal Problem:   GI bleed Active Problems:   A-fib ([place])    Tye Savoy, NP-C @  05/07/2022, 2:00 PM

## 2022-05-08 DIAGNOSIS — K5731 Diverticulosis of large intestine without perforation or abscess with bleeding: Secondary | ICD-10-CM | POA: Diagnosis not present

## 2022-05-08 DIAGNOSIS — I4891 Unspecified atrial fibrillation: Secondary | ICD-10-CM

## 2022-05-08 DIAGNOSIS — K922 Gastrointestinal hemorrhage, unspecified: Secondary | ICD-10-CM | POA: Diagnosis not present

## 2022-05-08 LAB — PREPARE FRESH FROZEN PLASMA: Unit division: 0

## 2022-05-08 LAB — CBC
HCT: 28.3 % — ABNORMAL LOW (ref 36.0–46.0)
Hemoglobin: 9 g/dL — ABNORMAL LOW (ref 12.0–15.0)
MCH: 29.2 pg (ref 26.0–34.0)
MCHC: 31.8 g/dL (ref 30.0–36.0)
MCV: 91.9 fL (ref 80.0–100.0)
Platelets: 168 10*3/uL (ref 150–400)
RBC: 3.08 MIL/uL — ABNORMAL LOW (ref 3.87–5.11)
RDW: 13.5 % (ref 11.5–15.5)
WBC: 7.3 10*3/uL (ref 4.0–10.5)
nRBC: 0 % (ref 0.0–0.2)

## 2022-05-08 LAB — BPAM FFP
Blood Product Expiration Date: 202312082359
ISSUE DATE / TIME: 202312042119
Unit Type and Rh: 6200

## 2022-05-08 LAB — RETICULOCYTES
Immature Retic Fract: 22.1 % — ABNORMAL HIGH (ref 2.3–15.9)
RBC.: 3.58 MIL/uL — ABNORMAL LOW (ref 3.87–5.11)
Retic Count, Absolute: 77 10*3/uL (ref 19.0–186.0)
Retic Ct Pct: 2.2 % (ref 0.4–3.1)

## 2022-05-08 LAB — PROTIME-INR
INR: 2 — ABNORMAL HIGH (ref 0.8–1.2)
Prothrombin Time: 22.8 seconds — ABNORMAL HIGH (ref 11.4–15.2)

## 2022-05-08 LAB — IRON AND TIBC
Iron: 42 ug/dL (ref 28–170)
Saturation Ratios: 11 % (ref 10.4–31.8)
TIBC: 385 ug/dL (ref 250–450)
UIBC: 343 ug/dL

## 2022-05-08 LAB — VITAMIN B12: Vitamin B-12: 507 pg/mL (ref 180–914)

## 2022-05-08 LAB — FOLATE: Folate: >40 ng/mL (ref >5.9)

## 2022-05-08 LAB — FERRITIN: Ferritin: 19 ng/mL (ref 11–307)

## 2022-05-08 MED ORDER — METOPROLOL TARTRATE 25 MG PO TABS
25.0000 mg | ORAL_TABLET | Freq: Every day | ORAL | Status: DC
  Administered 2022-05-08 – 2022-05-11 (×4): 25 mg via ORAL
  Filled 2022-05-08 (×4): qty 1

## 2022-05-08 MED ORDER — SODIUM CHLORIDE 0.9 % IV SOLN: INTRAVENOUS | Status: DC

## 2022-05-08 NOTE — Anesthesia Preprocedure Evaluation (Signed)
Anesthesia Evaluation  Patient identified by MRN, date of birth, ID band Patient awake    Reviewed: Allergy & Precautions, NPO status , Patient's Chart, lab work & pertinent test results  Airway Mallampati: II  TM Distance: >3 FB Neck ROM: Full    Dental  (+) Missing, Dental Advisory Given   Pulmonary sleep apnea , COPD, former smoker   Pulmonary exam normal breath sounds clear to auscultation       Cardiovascular hypertension, Pt. on home beta blockers and Pt. on medications  Rhythm:Regular Rate:Normal + Systolic murmurs Echo 4536 - Left ventricle: The cavity size was normal. Wall thickness was increased in a pattern of mild LVH. Systolic function was normal. The estimated ejection fraction was in the range of 55% to 60%. Wall motion was normal; there were no regional wall motion abnormalities. Doppler parameters are consistent with abnormal  left ventricular relaxation (grade 1 diastolic dysfunction).  - Aortic valve: Small systolic gradient across valve but no  significant stenosis Sclerosis without stenosis. Valve area  (VTI): 2.45 cm^2. Valve area (Vmax): 2.16 cm^2. Valve area  (Vmean): 2.09 cm^2.  - Mitral valve: Calcified annulus. Mildly thickened leaflets .     Neuro/Psych  PSYCHIATRIC DISORDERS Anxiety     negative neurological ROS     GI/Hepatic negative GI ROS, Neg liver ROS,,,  Endo/Other  negative endocrine ROS    Renal/GU negative Renal ROS     Musculoskeletal  (+) Arthritis ,    Abdominal   Peds  Hematology  (+) Blood dyscrasia, anemia   Anesthesia Other Findings   Reproductive/Obstetrics                              Anesthesia Physical Anesthesia Plan  ASA: 3  Anesthesia Plan: MAC   Post-op Pain Management: Minimal or no pain anticipated   Induction: Intravenous  PONV Risk Score and Plan: 2 and Propofol infusion, Treatment may vary due to age or medical condition and  TIVA  Airway Management Planned:   Additional Equipment:   Intra-op Plan:   Post-operative Plan:   Informed Consent: I have reviewed the patients History and Physical, chart, labs and discussed the procedure including the risks, benefits and alternatives for the proposed anesthesia with the patient or authorized representative who has indicated his/her understanding and acceptance.   Patient has DNR.  Discussed DNR with patient and Suspend DNR.   Dental advisory given  Plan Discussed with: CRNA  Anesthesia Plan Comments:          Anesthesia Quick Evaluation

## 2022-05-08 NOTE — Plan of Care (Signed)

## 2022-05-08 NOTE — ED Notes (Signed)
Gi Paged for pts NPO status. Pt able to eat today. NPO at midnight.

## 2022-05-08 NOTE — Progress Notes (Signed)
[facility] PROGRESS NOTE    Progress Note  Teresa Frey  NTZ:[id number] DOB: 1929/09/09 DOA: 05/07/2022 PCP: [name], [name], MD     Brief Narrative:   Teresa Frey is an 88 y.o. female past medical history significant for paroxysmal atrial fibrillation on Coumadin diverticulosis comes in with dark stools that started about 10 days prior to admission  Assessment/Plan:   Acute GI bleed/Diverticulosis of intestine with bleeding: FOBT is positive. He has history of diverticular bleed. Coumadin was held, his INR on admission was 2.7 she was given vitamin K and fresh frozen plasma. INR this morning is 2.0. Hemoglobin admission was 14, now this morning is 9.0 she is currently asymptomatic type and screen check an anemia panel. GI was consulted they recommended an EGD once INR is improved.  Keep the patient NPO.  A-fib (HCC)/Coumadin induced coagulopathy: Given vitamin K and fresh frozen plasma INR this morning is 2.0. Risk and benefits of reversing her INR were discussed with the patient. Continue to hold Coumadin.  Essential hypertension: Resume metoprolol.  DNR (do not resuscitate)     DVT prophylaxis: scd Family Communication:none Status is: Inpatient Remains inpatient appropriate because: Acute GI bleed    Code Status:     Code Status Orders  (From admission, onward)           Start     Ordered   05/07/22 1255  Do not attempt resuscitation (DNR)  Continuous       Question Answer Comment  In the event of cardiac or respiratory ARREST Do not call a "code blue"   In the event of cardiac or respiratory ARREST Do not perform Intubation, CPR, defibrillation or ACLS   In the event of cardiac or respiratory ARREST Use medication by any route, position, wound care, and other measures to relive pain and suffering. May use oxygen, suction and manual treatment of airway obstruction as needed for comfort.      05/07/22 1255           Code Status History      Date Active Date Inactive Code Status Order ID Comments User Context   08/11/2021 1415 08/15/2021 1853 DNR [id number]  Karmen Bongo, MD ED   04/19/2018 2148 04/24/2018 1525 Full Code [id number]  Etta Quill, DO ED   02/11/2016 2029 02/13/2016 1732 Full Code [id number]  Etta Quill, DO ED      Advance Directive Documentation    Flowsheet Row Most Recent Value  Type of Advance Directive Healthcare Power of Attorney, Living will  Pre-existing out of facility DNR order (yellow form or pink MOST form) --  "MOST" Form in Place? --         IV Access:   Peripheral IV   Procedures and diagnostic studies:   No results found.   Medical Consultants:   None.   Subjective:    Teresa Frey no further bleeding.  Objective:    Vitals:   05/08/22 0500 05/08/22 0506 05/08/22 0600 05/08/22 0632  BP: (!) 119/42 (!) 119/42 (!) 128/53 (!) 128/53  Pulse:  70  70  Resp:  16  16  Temp:  98 F (36.7 C)    TempSrc:  Oral    SpO2:  95%  97%  Weight:      Height:       SpO2: 97 %   Intake/Output Summary (Last 24 hours) at 05/08/2022 0709 Last data filed at 05/08/2022 0707 Gross per 24 hour  Intake 1375.5  ml  Output 1850 ml  Net -474.5 ml   Filed Weights   05/07/22 0953  Weight: 74.8 kg    Exam: General exam: In no acute distress. Respiratory system: Good air movement and clear to auscultation. Cardiovascular system: S1 & S2 heard, RRR. No JVD. Gastrointestinal system: Abdomen is nondistended, soft and nontender.  Extremities: No pedal edema. Skin: No rashes, lesions or ulcers Psychiatry: Judgement and insight appear normal. Mood & affect appropriate.    Data Reviewed:    Labs: Basic Metabolic Panel: Recent Labs  Lab 05/07/22 1030  NA 140  K 4.0  CL 108  CO2 29  GLUCOSE 117*  BUN 24*  CREATININE 0.81  CALCIUM 8.9   GFR Estimated Creatinine Clearance: 43.7 mL/min (by C-G formula based on SCr of 0.81 mg/dL). Liver Function Tests: Recent Labs   Lab 05/07/22 1030  AST 21  ALT 14  ALKPHOS 209*  BILITOT 0.4  PROT 6.2*  ALBUMIN 3.5   Recent Labs  Lab 05/07/22 1030  LIPASE 39   No results for input(s): "AMMONIA" in the last 168 hours. Coagulation profile Recent Labs  Lab 05/07/22 1030 05/08/22 0130  INR 3.7* 2.0*   COVID-19 Labs  Recent Labs    05/07/22 1030  FERRITIN 16    Lab Results  Component Value Date   SARSCOV2NAA NEGATIVE 08/11/2021   [place] Not Detected 11/02/2018    CBC: Recent Labs  Lab 05/07/22 1030 05/08/22 0130  WBC 10.6* 7.3  NEUTROABS 8.6*  --   HGB 11.5* 9.0*  HCT 34.8* 28.3*  MCV 91.1 91.9  PLT 186 168   Cardiac Enzymes: No results for input(s): "CKTOTAL", "CKMB", "CKMBINDEX", "TROPONINI" in the last 168 hours. BNP (last 3 results) No results for input(s): "PROBNP" in the last 8760 hours. CBG: No results for input(s): "GLUCAP" in the last 168 hours. D-Dimer: No results for input(s): "DDIMER" in the last 72 hours. Hgb A1c: No results for input(s): "HGBA1C" in the last 72 hours. Lipid Profile: No results for input(s): "CHOL", "HDL", "LDLCALC", "TRIG", "CHOLHDL", "LDLDIRECT" in the last 72 hours. Thyroid function studies: No results for input(s): "TSH", "T4TOTAL", "T3FREE", "THYROIDAB" in the last 72 hours.  Invalid input(s): "FREET3" Anemia work up: Recent Labs    05/07/22 1030  FERRITIN 16  TIBC 391  IRON 55  RETICCTPCT 1.7   Sepsis Labs: Recent Labs  Lab 05/07/22 1030 05/08/22 0130  WBC 10.6* 7.3   Microbiology No results found for this or any previous visit (from the past 240 hour(s)).   Medications:    pantoprazole  40 mg Oral BID AC   sertraline  50 mg Oral Daily   Continuous Infusions:    LOS: 1 day   [name]  [facility]  05/08/2022, 7:09 AM

## 2022-05-08 NOTE — ED Notes (Signed)
Helped patient back in bed on the monitor placed another external cath placed a brief patient is resting with call bell in reach

## 2022-05-09 ENCOUNTER — Inpatient Hospital Stay (HOSPITAL_COMMUNITY): Payer: Medicare Other | Admitting: Anesthesiology

## 2022-05-09 ENCOUNTER — Encounter (HOSPITAL_COMMUNITY)
Admission: EM | Disposition: A | Discharge status: Home Health | Payer: Self-pay | Source: Home / Self Care | Attending: Internal Medicine

## 2022-05-09 ENCOUNTER — Encounter (HOSPITAL_COMMUNITY): Payer: Self-pay | Admitting: Internal Medicine

## 2022-05-09 DIAGNOSIS — Z87891 Personal history of nicotine dependence: Secondary | ICD-10-CM

## 2022-05-09 DIAGNOSIS — D62 Acute posthemorrhagic anemia: Secondary | ICD-10-CM | POA: Diagnosis not present

## 2022-05-09 DIAGNOSIS — K2210 Ulcer of esophagus without bleeding: Secondary | ICD-10-CM

## 2022-05-09 DIAGNOSIS — I10 Essential (primary) hypertension: Secondary | ICD-10-CM

## 2022-05-09 DIAGNOSIS — J449 Chronic obstructive pulmonary disease, unspecified: Secondary | ICD-10-CM

## 2022-05-09 DIAGNOSIS — K922 Gastrointestinal hemorrhage, unspecified: Secondary | ICD-10-CM | POA: Diagnosis not present

## 2022-05-09 DIAGNOSIS — K2100 Gastro-esophageal reflux disease with esophagitis, without bleeding: Secondary | ICD-10-CM

## 2022-05-09 DIAGNOSIS — K209 Esophagitis, unspecified without bleeding: Secondary | ICD-10-CM | POA: Diagnosis not present

## 2022-05-09 DIAGNOSIS — K5731 Diverticulosis of large intestine without perforation or abscess with bleeding: Secondary | ICD-10-CM | POA: Diagnosis not present

## 2022-05-09 DIAGNOSIS — K2101 Gastro-esophageal reflux disease with esophagitis, with bleeding: Secondary | ICD-10-CM

## 2022-05-09 DIAGNOSIS — K449 Diaphragmatic hernia without obstruction or gangrene: Secondary | ICD-10-CM | POA: Diagnosis not present

## 2022-05-09 HISTORY — PX: ESOPHAGOGASTRODUODENOSCOPY (EGD) WITH PROPOFOL: SHX5813

## 2022-05-09 LAB — CBC
HCT: 30 % — ABNORMAL LOW (ref 36.0–46.0)
Hemoglobin: 9.5 g/dL — ABNORMAL LOW (ref 12.0–15.0)
MCH: 29.1 pg (ref 26.0–34.0)
MCHC: 31.7 g/dL (ref 30.0–36.0)
MCV: 92 fL (ref 80.0–100.0)
Platelets: 166 10*3/uL (ref 150–400)
RBC: 3.26 MIL/uL — ABNORMAL LOW (ref 3.87–5.11)
RDW: 13.5 % (ref 11.5–15.5)
WBC: 8 10*3/uL (ref 4.0–10.5)
nRBC: 0 % (ref 0.0–0.2)

## 2022-05-09 LAB — PROTIME-INR
INR: 1.3 — ABNORMAL HIGH (ref 0.8–1.2)
Prothrombin Time: 16.2 seconds — ABNORMAL HIGH (ref 11.4–15.2)

## 2022-05-09 SURGERY — ESOPHAGOGASTRODUODENOSCOPY (EGD) WITH PROPOFOL
Anesthesia: Monitor Anesthesia Care

## 2022-05-09 MED ORDER — ALBUTEROL SULFATE HFA 108 (90 BASE) MCG/ACT IN AERS
INHALATION_SPRAY | RESPIRATORY_TRACT | Status: DC | PRN
  Administered 2022-05-09 (×2): 2 via RESPIRATORY_TRACT

## 2022-05-09 MED ORDER — GLYCOPYRROLATE PF 0.2 MG/ML IJ SOSY
PREFILLED_SYRINGE | INTRAMUSCULAR | Status: DC | PRN
  Administered 2022-05-09: .2 mg via INTRAVENOUS

## 2022-05-09 MED ORDER — SODIUM CHLORIDE 0.9 % IV SOLN: INTRAVENOUS | Status: DC

## 2022-05-09 MED ORDER — PROPOFOL 10 MG/ML IV BOLUS
INTRAVENOUS | Status: DC | PRN
  Administered 2022-05-09: 10 mg via INTRAVENOUS
  Administered 2022-05-09 (×2): 5 mg via INTRAVENOUS

## 2022-05-09 MED ORDER — SODIUM CHLORIDE 0.9 % IV SOLN: INTRAVENOUS | Status: DC | PRN

## 2022-05-09 MED ORDER — LIDOCAINE 2% (20 MG/ML) 5 ML SYRINGE
INTRAMUSCULAR | Status: DC | PRN
  Administered 2022-05-09 (×2): 40 mg via INTRAVENOUS

## 2022-05-09 MED ORDER — PROPOFOL 500 MG/50ML IV EMUL
INTRAVENOUS | Status: DC | PRN
  Administered 2022-05-09: 100 ug/kg/min via INTRAVENOUS

## 2022-05-09 SURGICAL SUPPLY — 15 items

## 2022-05-09 NOTE — Progress Notes (Signed)
  Transition of Care (TOC) Screening Note   Patient Details  Name: Teresa Frey Date of Birth: [birth year]   Transition of Care [facility]) CM/SW Contact:    Tom-Johnson, Renea Ee, RN Phone Number: 05/09/2022, 1:04 PM  Transition of Care Department [facility]) has reviewed patient and no TOC needs or recommendations have been identified at this time. TOC will continue to monitor patient advancement through interdisciplinary progression rounds. If new patient transition needs arise, please place a TOC consult.

## 2022-05-09 NOTE — Op Note (Signed)
[facility] Patient Name: Teresa Frey Procedure Date : 05/09/2022 MRN: [id number] Attending MD: Mauri Pole , MD, [id number] Date of Birth: [birth year] CSN: [id number] Age: 88 Admit Type: Inpatient Procedure:                Upper GI endoscopy Indications:              Suspected upper gastrointestinal bleeding Providers:                Mauri Pole, MD, Jaci Carrel, RN Referring MD:              Medicines:                Monitored Anesthesia Care Complications:            No immediate complications. Estimated Blood Loss:     Estimated blood loss was minimal. Procedure:                Pre-Anesthesia Assessment:                           - Prior to the procedure, a History and Physical                            was performed, and patient medications and                            allergies were reviewed. The patient's tolerance of                            previous anesthesia was also reviewed. The risks                            and benefits of the procedure and the sedation                            options and risks were discussed with the patient.                            All questions were answered, and informed consent                            was obtained. Prior Anticoagulants: The patient has                            taken no anticoagulant or antiplatelet agents. ASA                            Grade Assessment: III - A patient with severe                            systemic disease. After reviewing the risks and                            benefits, the patient was deemed in satisfactory  condition to undergo the procedure.                           After obtaining informed consent, the endoscope was                            passed under direct vision. Throughout the                            procedure, the patient's blood pressure, pulse, and                            oxygen saturations were monitored  continuously. The                            GIF-H190 ([id number]) Olympus endoscope was introduced                            through the mouth, and advanced to the second part                            of duodenum. The upper GI endoscopy was                            accomplished without difficulty. The patient                            tolerated the procedure well. Scope In: Scope Out: Findings:      LA Grade B (one or more mucosal breaks greater than 5 mm, not extending       between the tops of two mucosal folds) esophagitis with no bleeding was       found 34 to 36 cm from the incisors.      Two linear esophageal ulcers with no bleeding and no stigmata of recent       bleeding were found 35 to 36 cm from the incisors. The largest lesion       was 3 mm in largest dimension.      A 4 cm hiatal hernia was present.      The stomach was normal.      The cardia and gastric fundus were normal on retroflexion.      The examined duodenum was normal. Impression:               - LA Grade B reflux esophagitis with no bleeding.                           - Esophageal ulcers with no bleeding and no                            stigmata of recent bleeding.                           - 4 cm hiatal hernia.                           -  Normal stomach.                           - Normal examined duodenum.                           - No specimens collected. Recommendation:           - Patient has a contact number available for                            emergencies. The signs and symptoms of potential                            delayed complications were discussed with the                            patient. Return to normal activities tomorrow.                            Written discharge instructions were provided to the                            patient.                           - Resume previous diet.                           - Continue present medications.                           - No  aspirin, ibuprofen, naproxen, or other                            non-steroidal anti-inflammatory drugs.                           - Follow an antireflux regimen.                           - Use Protonix (pantoprazole) 40 mg PO BID for 3                            months.                           - Follow up in GI office in 2 months                           - Ok to restart Coumadin in 2 days, maintain INR                            within therapeutic range                           - OK to discharge home from GI standpoint Procedure Code(s):        ---  Professional ---                           [phone], Esophagogastroduodenoscopy, flexible,                            transoral; diagnostic, including collection of                            specimen(s) by brushing or washing, when performed                            (separate procedure) Diagnosis Code(s):        --- Professional ---                           K21.00, Gastro-esophageal reflux disease with                            esophagitis, without bleeding                           K22.10, Ulcer of esophagus without bleeding                           K44.9, Diaphragmatic hernia without obstruction or                            gangrene CPT copyright 2022 [employer]. All rights reserved. The codes documented in this report are preliminary and upon coder review may  be revised to meet current compliance requirements. Mauri Pole, MD 05/09/2022 12:46:42 PM This report has been signed electronically. Number of Addenda: 0

## 2022-05-09 NOTE — Transfer of Care (Signed)
Immediate Anesthesia Transfer of Care Note  Patient: Teresa Frey  Procedure(s) Performed: ESOPHAGOGASTRODUODENOSCOPY (EGD) WITH PROPOFOL  Patient Location: PACU  Anesthesia Type:MAC  Level of Consciousness: awake and patient cooperative  Airway & Oxygen Therapy: Patient Spontanous Breathing and Patient connected to face mask oxygen  Post-op Assessment: Report given to RN and Post -op Vital signs reviewed and stable  Post vital signs: Reviewed and stable  Last Vitals:  Vitals Value Taken Time  BP 113/59 05/09/22 1245  Temp 36.7 C 05/09/22 1245  Pulse 78 05/09/22 1253  Resp 19 05/09/22 1253  SpO2 98 % 05/09/22 1253  Vitals shown include unvalidated device data.  Last Pain:  Vitals:   05/09/22 1245  TempSrc:   PainSc: 0-No pain         Complications: No notable events documented.

## 2022-05-09 NOTE — Progress Notes (Signed)
[facility] PROGRESS NOTE    Progress Note  CHANNIE BOSTICK  OEU:[id number] DOB: 05-26-1930 DOA: 05/07/2022 PCP: [name], [name], MD     Brief Narrative:   Teresa Frey is an 88 y.o. female past medical history significant for paroxysmal atrial fibrillation on Coumadin diverticulosis comes in with dark stools that started about 10 days prior to admission  Assessment/Plan:   Acute GI bleed/Diverticulosis of intestine with bleeding: He has history of diverticular bleed. Coumadin was held, she was given vitamin K and fresh frozen plasma. PT/INR and CBC is pending this morning. GI was consulted they recommended an EGD once INR is improved. Awaiting GI further recommendations.  A-fib (HCC)/Coumadin induced coagulopathy: Given vitamin K and fresh frozen plasma INR this morning is 2.0. INR is pending this morning.  Essential hypertension: Resume metoprolol.  DNR (do not resuscitate)     DVT prophylaxis: scd Family Communication:none Status is: Inpatient Remains inpatient appropriate because: Acute GI bleed    Code Status:     Code Status Orders  (From admission, onward)           Start     Ordered   05/07/22 1255  Do not attempt resuscitation (DNR)  Continuous       Question Answer Comment  In the event of cardiac or respiratory ARREST Do not call a "code blue"   In the event of cardiac or respiratory ARREST Do not perform Intubation, CPR, defibrillation or ACLS   In the event of cardiac or respiratory ARREST Use medication by any route, position, wound care, and other measures to relive pain and suffering. May use oxygen, suction and manual treatment of airway obstruction as needed for comfort.      05/07/22 1255           Code Status History     Date Active Date Inactive Code Status Order ID Comments User Context   08/11/2021 1415 08/15/2021 1853 DNR [id number]  Karmen Bongo, MD ED   04/19/2018 2148 04/24/2018 1525 Full Code [id number]  Etta Quill, DO ED   02/11/2016 2029 02/13/2016 1732 Full Code [id number]  Etta Quill, DO ED      Advance Directive Documentation    Flowsheet Row Most Recent Value  Type of Advance Directive Healthcare Power of Attorney, Living will  Pre-existing out of facility DNR order (yellow form or pink MOST form) --  "MOST" Form in Place? --         IV Access:   Peripheral IV   Procedures and diagnostic studies:   No results found.   Medical Consultants:   None.   Subjective:    [name] related she had black tarry stools had 1 this morning  Objective:    Vitals:   05/08/22 1518 05/08/22 2026 05/09/22 0547 05/09/22 0830  BP: 135/60 (!) 123/45 (!) 131/58 (!) 125/59  Pulse: 71 76 79 79  Resp: '18 18 18 17  '$ Temp: 98.7 F (37.1 C) 98.9 F (37.2 C) 98 F (36.7 C) 99.1 F (37.3 C)  TempSrc: Oral Oral Oral Oral  SpO2: 95% 94% 91% 98%  Weight: 72 kg     Height: '5\' 2"'$  (1.575 m)      SpO2: 98 %   Intake/Output Summary (Last 24 hours) at 05/09/2022 0940 Last data filed at 05/09/2022 0300 Gross per 24 hour  Intake 153.14 ml  Output 600 ml  Net -446.86 ml    Filed Weights   05/07/22 0953 05/08/22 1518  Weight: 74.8 kg 72 kg    Exam: General exam: In no acute distress. Respiratory system: Good air movement and clear to auscultation. Cardiovascular system: S1 & S2 heard, RRR. No JVD. Gastrointestinal system: Abdomen is nondistended, soft and nontender.  Extremities: No pedal edema. Skin: No rashes, lesions or ulcers Psychiatry: Judgement and insight appear normal. Mood & affect appropriate.   Data Reviewed:    Labs: Basic Metabolic Panel: Recent Labs  Lab 05/07/22 1030  NA 140  K 4.0  CL 108  CO2 29  GLUCOSE 117*  BUN 24*  CREATININE 0.81  CALCIUM 8.9    GFR Estimated Creatinine Clearance: 42.9 mL/min (by C-G formula based on SCr of 0.81 mg/dL). Liver Function Tests: Recent Labs  Lab 05/07/22 1030  AST 21  ALT 14  ALKPHOS 209*   BILITOT 0.4  PROT 6.2*  ALBUMIN 3.5    Recent Labs  Lab 05/07/22 1030  LIPASE 39    No results for input(s): "AMMONIA" in the last 168 hours. Coagulation profile Recent Labs  Lab 05/07/22 1030 05/08/22 0130  INR 3.7* 2.0*    COVID-19 Labs  Recent Labs    05/07/22 1030 05/08/22 0730  FERRITIN 16 19     Lab Results  Component Value Date   SARSCOV2NAA NEGATIVE 08/11/2021   [place] Not Detected 11/02/2018    CBC: Recent Labs  Lab 05/07/22 1030 05/08/22 0130  WBC 10.6* 7.3  NEUTROABS 8.6*  --   HGB 11.5* 9.0*  HCT 34.8* 28.3*  MCV 91.1 91.9  PLT 186 168    Cardiac Enzymes: No results for input(s): "CKTOTAL", "CKMB", "CKMBINDEX", "TROPONINI" in the last 168 hours. BNP (last 3 results) No results for input(s): "PROBNP" in the last 8760 hours. CBG: No results for input(s): "GLUCAP" in the last 168 hours. D-Dimer: No results for input(s): "DDIMER" in the last 72 hours. Hgb A1c: No results for input(s): "HGBA1C" in the last 72 hours. Lipid Profile: No results for input(s): "CHOL", "HDL", "LDLCALC", "TRIG", "CHOLHDL", "LDLDIRECT" in the last 72 hours. Thyroid function studies: No results for input(s): "TSH", "T4TOTAL", "T3FREE", "THYROIDAB" in the last 72 hours.  Invalid input(s): "FREET3" Anemia work up: Recent Labs    05/07/22 1030 05/08/22 0730 05/08/22 0811  VITAMINB12  --   --  507  FOLATE  --  >40.0  --   FERRITIN 16 19  --   TIBC 391 385  --   IRON 55 42  --   RETICCTPCT 1.7  --  2.2    Sepsis Labs: Recent Labs  Lab 05/07/22 1030 05/08/22 0130  WBC 10.6* 7.3    Microbiology No results found for this or any previous visit (from the past 240 hour(s)).   Medications:    metoprolol tartrate  25 mg Oral Daily   pantoprazole  40 mg Oral BID AC   sertraline  50 mg Oral Daily   Continuous Infusions:  sodium chloride 20 mL/hr at 05/08/22 1758      LOS: 2 days   Teresa Frey  [facility]  05/09/2022, 9:40  AM

## 2022-05-09 NOTE — Plan of Care (Signed)

## 2022-05-09 NOTE — Anesthesia Procedure Notes (Signed)
Procedure Name: MAC Date/Time: 05/09/2022 12:10 PM  Performed by: Janene Harvey, CRNAPre-anesthesia Checklist: Patient identified, Emergency Drugs available, Suction available and Patient being monitored Patient Re-evaluated:Patient Re-evaluated prior to induction Oxygen Delivery Method: Simple face mask Induction Type: IV induction Placement Confirmation: positive ETCO2 Dental Injury: Teeth and Oropharynx as per pre-operative assessment

## 2022-05-09 NOTE — Interval H&P Note (Signed)
History and Physical Interval Note:  05/09/2022 11:43 AM  Teresa Frey  has presented today for surgery, with the diagnosis of melena.  The various methods of treatment have been discussed with the patient and family. After consideration of risks, benefits and other options for treatment, the patient has consented to  Procedure(s): ESOPHAGOGASTRODUODENOSCOPY (EGD) WITH PROPOFOL (N/A) as a surgical intervention.  The patient's history has been reviewed, patient examined, no change in status, stable for surgery.  I have reviewed the patient's chart and labs.  Questions were answered to the patient's satisfaction.     [name]

## 2022-05-09 NOTE — Evaluation (Signed)
Physical Therapy Evaluation Patient Details Name: Teresa Frey MRN: [id number] DOB: 06/18/1929 Today's Date: 05/09/2022  History of Present Illness  Teresa Frey is a 88 y.o. female, presented with persistent dark-colored stool; with medical history significant of PAF on Coumadin, HTN, diverticulosis  Clinical Impression   Pt admitted with above diagnosis. Lives at home alone (family stays over a protion of the week), in a two-level home with 2 steps to enter; Prior to admission, pt was able to manage independently, uses cane to walk in home, and RW fro community access; Presents to PT with generlized weakness; Min assist to stand and take marching steps and sidesteps at EOB; Pt currently with functional limitations due to the deficits listed below (see PT Problem List). Pt will benefit from skilled PT to increase their independence and safety with mobility to allow discharge to the venue listed below.          Recommendations for follow up therapy are one component of a multi-disciplinary discharge planning process, led by the attending physician.  Recommendations may be updated based on patient status, additional functional criteria and insurance authorization.  Follow Up Recommendations No PT follow up      Assistance Recommended at Discharge PRN  Patient can return home with the following       Equipment Recommendations None recommended by PT  Recommendations for Other Services       Functional Status Assessment Patient has had a recent decline in their functional status and demonstrates the ability to make significant improvements in function in a reasonable and predictable amount of time.     Precautions / Restrictions Precautions Precautions: Fall Precaution Comments: Fall risk is present, but minimal Restrictions Weight Bearing Restrictions: No      Mobility  Bed Mobility Overal bed mobility: Needs Assistance Bed Mobility: Supine to Sit     Supine to sit: Min  assist     General bed mobility comments: min handheld assist to pull to sit    Transfers Overall transfer level: Needs assistance Equipment used: Rolling walker (2 wheels) Transfers: Sit to/from Stand, Bed to chair/wheelchair/BSC Sit to Stand: Min guard   Step pivot transfers: Min guard       General transfer comment: [place] for safety and cues to self-monitor for activity tolernace; simulated step pivot by taking sidestape to [facility], and marching in place    Ambulation/Gait                  Stairs            Wheelchair Mobility    Modified Rankin (Stroke Patients Only)       Balance                                             Pertinent Vitals/Pain Pain Assessment Pain Assessment: No/denies pain    Home Living Family/patient expects to be discharged to:: Private residence Living Arrangements: Alone (except granddaughter stays overnight a portion of the week) Available Help at Discharge: Family;Available PRN/intermittently Type of Home: House Home Access: Stairs to enter Entrance Stairs-Rails: Right Entrance Stairs-Number of Steps: 2 steps and a rail Alternate Level Stairs-Number of Steps:  (If she goes down  to the apartment, she drives around to the back of the house) Home Layout: Two level;Able to live on main level with bedroom/bathroom;Other (Comment) (rents basement apartment, though no  renters at this time) Home Equipment: Shower seat;BSC/3in1;Cane - single point;Wheelchair Probation officer (4 wheels)      Prior Function Prior Level of Function : Independent/Modified Independent             Mobility Comments: uses SPC for mobility ADLs Comments: Indep basic ADLs, endorses generally sedentary at home, cares for dog     Hand Dominance   Dominant Hand: Right    Extremity/Trunk Assessment   Upper Extremity Assessment Upper Extremity Assessment: Generalized weakness    Lower Extremity Assessment Lower Extremity  Assessment: Generalized weakness       Communication   Communication: No difficulties  Cognition Arousal/Alertness: Awake/alert Behavior During Therapy: WFL for tasks assessed/performed Overall Cognitive Status: Within Functional Limits for tasks assessed                                          General Comments General comments (skin integrity, edema, etc.): Noted mild wheezing with marching in place; O2 sat 97% on room air, HR 71 with march-in-place activity    Exercises     Assessment/Plan    PT Assessment Patient needs continued PT services  PT Problem List Decreased strength;Decreased activity tolerance       PT Treatment Interventions DME instruction;Gait training;Stair training;Functional mobility training;Therapeutic exercise;Therapeutic activities;Patient/family education;Balance training    PT Goals (Current goals can be found in the Care Plan section)  Acute Rehab PT Goals Patient Stated Goal: hopes procedures go well PT Goal Formulation: With patient Time For Goal Achievement: 05/16/22 Potential to Achieve Goals: Good    Frequency Min 3X/week     Co-evaluation               AM-PAC PT "6 Clicks" Mobility  Outcome Measure Help needed turning from your back to your side while in a flat bed without using bedrails?: None Help needed moving from lying on your back to sitting on the side of a flat bed without using bedrails?: None Help needed moving to and from a bed to a chair (including a wheelchair)?: None Help needed standing up from a chair using your arms (e.g., wheelchair or bedside chair)?: A Little Help needed to walk in hospital room?: A Little Help needed climbing 3-5 steps with a railing? : A Little 6 Click Score: 21    End of Session   Activity Tolerance: Patient tolerated treatment well Patient left: in bed;with call bell/phone within reach Nurse Communication: Mobility status PT Visit Diagnosis: Muscle weakness  (generalized) (M62.81)    Time: 8675-4492 PT Time Calculation (min) (ACUTE ONLY): 18 min   Charges:   PT Evaluation $PT Eval Low Complexity: [place], PT  Acute Rehabilitation Services Office [place] 05/09/2022, 1:22 PM

## 2022-05-10 ENCOUNTER — Inpatient Hospital Stay (HOSPITAL_COMMUNITY): Payer: Medicare Other

## 2022-05-10 LAB — CBC
HCT: 29.1 % — ABNORMAL LOW (ref 36.0–46.0)
Hemoglobin: 9.5 g/dL — ABNORMAL LOW (ref 12.0–15.0)
MCH: 29.5 pg (ref 26.0–34.0)
MCHC: 32.6 g/dL (ref 30.0–36.0)
MCV: 90.4 fL (ref 80.0–100.0)
Platelets: 144 10*3/uL — ABNORMAL LOW (ref 150–400)
RBC: 3.22 MIL/uL — ABNORMAL LOW (ref 3.87–5.11)
RDW: 13.3 % (ref 11.5–15.5)
WBC: 7.3 10*3/uL (ref 4.0–10.5)
nRBC: 0 % (ref 0.0–0.2)

## 2022-05-10 LAB — RESPIRATORY PANEL BY PCR

## 2022-05-10 LAB — PROTIME-INR
INR: 1.2 (ref 0.8–1.2)
Prothrombin Time: 14.7 seconds (ref 11.4–15.2)

## 2022-05-10 MED ORDER — ALBUTEROL SULFATE HFA 108 (90 BASE) MCG/ACT IN AERS
2.0000 | INHALATION_SPRAY | RESPIRATORY_TRACT | Status: DC | PRN
  Administered 2022-05-10: 2 via RESPIRATORY_TRACT

## 2022-05-10 MED ORDER — SODIUM CHLORIDE 0.9 % IV SOLN
250.0000 mg | Freq: Every day | INTRAVENOUS | Status: AC
  Administered 2022-05-10 – 2022-05-11 (×2): 250 mg via INTRAVENOUS
  Filled 2022-05-10 (×2): qty 20

## 2022-05-10 MED ORDER — ALBUTEROL SULFATE (2.5 MG/3ML) 0.083% IN NEBU: 2.5000 mg | INHALATION_SOLUTION | RESPIRATORY_TRACT | Status: DC | PRN

## 2022-05-10 MED ORDER — POLYETHYLENE GLYCOL 3350 17 G PO PACK
17.0000 g | PACK | Freq: Two times a day (BID) | ORAL | Status: DC
  Administered 2022-05-10 – 2022-05-11 (×3): 17 g via ORAL
  Filled 2022-05-10 (×3): qty 1

## 2022-05-10 NOTE — Progress Notes (Signed)
Physical Therapy Treatment Patient Details Name: GESELLE CARDOSA MRN: [id number] DOB: 1930-02-29 Today's Date: 05/10/2022   History of Present Illness Reese E Tuccillo is a 88 y.o. female, presented with persistent dark-colored stool; with medical history significant of PAF on Coumadin, HTN, diverticulosis    PT Comments    Tolerated gait and stair training today. Min assist with stairs (CGA) demonstrates obvious weakness with LLE, using rail on Rt with bil hand support to simulate home entrance. Pt states daughter will be able to assist in/out of home. Updated recs to HHPT due to weakness with stair navigation and safety concern. Reports hx of lt femur fracture which I suspect is the culprit here. Patient will continue to benefit from skilled physical therapy services to further improve independence with functional mobility.   Recommendations for follow up therapy are one component of a multi-disciplinary discharge planning process, led by the attending physician.  Recommendations may be updated based on patient status, additional functional criteria and insurance authorization.  Follow Up Recommendations  Home health PT     Assistance Recommended at Discharge PRN  Patient can return home with the following Help with stairs or ramp for entrance;Assist for transportation   Equipment Recommendations  None recommended by PT    Recommendations for Other Services       Precautions / Restrictions Precautions Precautions: Fall Precaution Comments: Fall risk is present, but minimal Restrictions Weight Bearing Restrictions: No     Mobility  Bed Mobility Overal bed mobility: Modified Independent             General bed mobility comments: Extra time, no assist from therapist.    Transfers Overall transfer level: Needs assistance Equipment used: Rolling walker (2 wheels) Transfers: Sit to/from Stand Sit to Stand: Min guard           General transfer comment: Close guard for  safety with cues for hand placement to rise. Slow and effortful.    Ambulation/Gait Ambulation/Gait assistance: Min guard Gait Distance (Feet): 75 Feet Assistive device: Rolling walker (2 wheels) Gait Pattern/deviations: Step-through pattern, Decreased stride length, Drifts right/left, Antalgic, Decreased stance time - left Gait velocity: dec Gait velocity interpretation: <1.31 ft/sec, indicative of household ambulator   General Gait Details: Minor instability noted with RW for support. Slight antalgic pattern with decreased stance time on Lt. VC for sequencing. No buckling noted.   Stairs Stairs: Yes Stairs assistance: Min assist Stair Management: One rail Right, Step to pattern, Forwards, Sideways Number of Stairs: 3 (+2) General stair comments: Educated on technique, demonstrated with and withour RW. Attempted forward step-to pattern however unable to complete due to LLE weakness - performed lateral approach with bil hands on rail with better success. Very challenging for patient, and required contact guard assist. Reduced control stepping down but with good foot placement. Performed with simulated set-up of home entrance. Demonstrated posterior approach with RW however pt decliens to walker.   Wheelchair Mobility    Modified Rankin (Stroke Patients Only)       Balance                                            Cognition Arousal/Alertness: Awake/alert Behavior During Therapy: WFL for tasks assessed/performed Overall Cognitive Status: Within Functional Limits for tasks assessed  Exercises General Exercises - Lower Extremity Ankle Circles/Pumps: AROM, Both, 10 reps, Supine Quad Sets: Strengthening, Both, 5 reps, Supine Heel Slides: Strengthening, Both, 10 reps, Supine Straight Leg Raises: Strengthening, Both, 5 reps, Supine Other Exercises Other Exercises: bridge supine x5    General Comments         Pertinent Vitals/Pain Pain Assessment Pain Assessment: Faces Faces Pain Scale: Hurts a little bit Pain Location: LLQ Pain Descriptors / Indicators: Sore Pain Intervention(s): Monitored during session, Repositioned (Pt reports she just told MD about this issue)    Home Living                          Prior Function            PT Goals (current goals can now be found in the care plan section) Acute Rehab PT Goals Patient Stated Goal: Go home PT Goal Formulation: With patient Time For Goal Achievement: 05/16/22 Potential to Achieve Goals: Good Progress towards PT goals: Progressing toward goals    Frequency    Min 3X/week      PT Plan Discharge plan needs to be updated    Co-evaluation              AM-PAC PT "6 Clicks" Mobility   Outcome Measure  Help needed turning from your back to your side while in a flat bed without using bedrails?: None Help needed moving from lying on your back to sitting on the side of a flat bed without using bedrails?: None Help needed moving to and from a bed to a chair (including a wheelchair)?: None Help needed standing up from a chair using your arms (e.g., wheelchair or bedside chair)?: A Little Help needed to walk in hospital room?: A Little Help needed climbing 3-5 steps with a railing? : A Little 6 Click Score: 21    End of Session Equipment Utilized During Treatment: Gait belt Activity Tolerance: Patient tolerated treatment well Patient left: with call bell/phone within reach;in bed;with bed alarm set Nurse Communication: Mobility status PT Visit Diagnosis: Muscle weakness (generalized) (M62.81)     Time: 7564-3329 PT Time Calculation (min) (ACUTE ONLY): 13 min  Charges:  $Gait Training: 8-22 mins                     Candie Mile, PT, DPT Physical Therapist Acute Rehabilitation Services [place] 05/10/2022, 11:23 AM

## 2022-05-10 NOTE — Anesthesia Postprocedure Evaluation (Signed)
Anesthesia Post Note  Patient: Teresa Frey  Procedure(s) Performed: ESOPHAGOGASTRODUODENOSCOPY (EGD) WITH PROPOFOL     Patient location during evaluation: PACU Anesthesia Type: MAC Level of consciousness: awake and alert Pain management: pain level controlled Vital Signs Assessment: post-procedure vital signs reviewed and stable Respiratory status: spontaneous breathing Cardiovascular status: stable Anesthetic complications: no   No notable events documented.  Last Vitals:  Vitals:   05/10/22 0514 05/10/22 0840  BP: (!) 134/59 (!) 122/59  Pulse: 80 78  Resp: 18 20  Temp: 37.1 C 37 C  SpO2: 92% 93%    Last Pain:  Vitals:   05/10/22 0840  TempSrc: Oral  PainSc:                  Nolon Nations

## 2022-05-10 NOTE — Care Management Important Message (Signed)
Important Message  Patient Details  Name: RANEEN JAFFER MRN: [id number] Date of Birth: [birth year]   Medicare Important Message Given:  Yes  Patient has a contact precaution order in place call the patient in room to advice of the  Important  message from Medicare and she ask that I mail to the patient.home.    Jayelyn Barno 05/10/2022, 2:47 PM

## 2022-05-11 DIAGNOSIS — K2211 Ulcer of esophagus with bleeding: Secondary | ICD-10-CM | POA: Diagnosis not present

## 2022-05-11 DIAGNOSIS — I4891 Unspecified atrial fibrillation: Secondary | ICD-10-CM | POA: Diagnosis not present

## 2022-05-11 DIAGNOSIS — K922 Gastrointestinal hemorrhage, unspecified: Secondary | ICD-10-CM | POA: Diagnosis not present

## 2022-05-11 LAB — CBC
HCT: 30.9 % — ABNORMAL LOW (ref 36.0–46.0)
Hemoglobin: 9.7 g/dL — ABNORMAL LOW (ref 12.0–15.0)
MCH: 29.1 pg (ref 26.0–34.0)
MCHC: 31.4 g/dL (ref 30.0–36.0)
MCV: 92.8 fL (ref 80.0–100.0)
Platelets: 171 10*3/uL (ref 150–400)
RBC: 3.33 MIL/uL — ABNORMAL LOW (ref 3.87–5.11)
RDW: 13.6 % (ref 11.5–15.5)
WBC: 7.6 10*3/uL (ref 4.0–10.5)
nRBC: 0 % (ref 0.0–0.2)

## 2022-05-11 LAB — PROTIME-INR
INR: 1.1 (ref 0.8–1.2)
Prothrombin Time: 14.4 seconds (ref 11.4–15.2)

## 2022-05-11 MED ORDER — PANTOPRAZOLE SODIUM 40 MG PO TBEC: 40.0000 mg | DELAYED_RELEASE_TABLET | Freq: Two times a day (BID) | ORAL | 2 refills | Status: DC

## 2022-05-11 MED ORDER — WARFARIN SODIUM 5 MG PO TABS: ORAL_TABLET | ORAL | 1 refills | Status: DC

## 2022-05-11 NOTE — TOC Transition Note (Signed)
Transition of Care [facility]) - CM/SW Discharge Note   Patient Details  Name: Teresa Frey MRN: [id number] Date of Birth: [birth year]  Transition of Care [facility]) CM/SW Contact:  Tom-Johnson, Renea Ee, RN Phone Number: 05/11/2022, 11:12 AM   Clinical Narrative:     Patient is scheduled for discharge today. Home health referral called in to [facility] per patient's request as patient states she was active with their services in May. [facility] voiced acceptance and info on AVS. Daughter to transport at discharge. No further TOC needs noted.      Final next level of care: [place] Barriers to Discharge: Barriers Resolved   Patient Goals and CMS Choice Patient states their goals for this hospitalization and ongoing recovery are:: To return home CMS Medicare.gov Compare Post Acute Care list provided to:: Patient Choice offered to / list presented to : Patient  Discharge Placement                Patient to be transferred to facility by: Daughter      Discharge Plan and Services                DME Arranged: N/A DME Agency: NA       HH Arranged: PT HH Agency: [place] Date Manuel Garcia: 05/11/22 Time [place]: 1030 Representative spoke with at [place]: [place] Determinants of Health ([place]) Interventions Transportation Interventions: Intervention Not Indicated, Inpatient TOC, Patient Resources (Friends/Family)   Readmission Risk Interventions     No data to display

## 2022-05-11 NOTE — Discharge Summary (Addendum)
Physician Discharge Summary  Teresa Frey JIR:[id number] DOB: December 07, 1929 DOA: 05/07/2022  PCP: [name], [name], MD  Admit date: 05/07/2022 Discharge date: 05/11/2022  Admitted From: [facility] Disposition:  Home  Recommendations for Outpatient Follow-up:  Follow up with PCP in 1-2 weeks Please obtain BMP/CBC in one week   Home Health:No Equipment/Devices:None  Discharge Condition:Stable CODE STATUS:Full Diet recommendation: Heart Healthy   Brief/Interim Summary: 88 y.o. female past medical history significant for paroxysmal atrial fibrillation on Coumadin diverticulosis comes in with dark stools that started about 10 days prior to admission   Discharge Diagnoses:  Principal Problem:   GI bleed Active Problems:   Essential hypertension   Diverticulosis of intestine with bleeding   A-fib ([place])   DNR (do not resuscitate)   Acute blood loss anemia   Gastroesophageal reflux disease with esophagitis and hemorrhage  Acute GI bleed esophageal ulcer with bleeding/blood loss anemia: Coumadin was held she was given IV vitamin K and fresh frozen plasma. EGD was done that showed an esophageal ulcer which was cauterized and hiatal hernia.   She was started on Protonix which she will continue for 3 months. Her ferritin was 19 she was given IV iron. She will restart her Coumadin in 3 days postdischarge. Physical therapy evaluated the patient recommended home health PT.  Productive cough: SARS-CoV-2 and influenza PCR was negative. Respiratory panel showed coronavirus positive. Chest x-ray showed no acute findings.  Left lower quadrant abdominal pain: She was given a stool softener this resolved.  Chronic A-fib: INR on the day of discharge 1.2, she will resume her Coumadin in 2 days postdischarge follow-up with the Coumadin clinic.  DNR: Noted.  Discharge Instructions  Discharge Instructions     Diet - low sodium heart healthy   Complete by: As directed    Increase activity  slowly   Complete by: As directed       Allergies as of 05/11/2022       Reactions   Hydrocodone-acetaminophen Other (See Comments)   UNK reaction   Tramadol    Loss of control        Medication List     TAKE these medications    acetaminophen 500 MG tablet Commonly known as: TYLENOL Take 500 mg by mouth daily.   albuterol 108 (90 Base) MCG/ACT inhaler Commonly known as: VENTOLIN HFA Inhale 2 puffs into the lungs every 6 (six) hours as needed for wheezing or shortness of breath.   BIOTIN 5000 PO Take 5,000 mcg by mouth daily.   Centrum Silver Adult 50+ Tabs Take 1 tablet by mouth daily.   cholecalciferol 25 MCG (1000 UNIT) tablet Commonly known as: VITAMIN D3 Take 1,000 Units by mouth daily.   cyanocobalamin 1000 MCG tablet Commonly known as: VITAMIN B12 Take 1,000 mcg by mouth daily.   hydrochlorothiazide 12.5 MG capsule Commonly known as: MICROZIDE TAKE 1 CAPSULE BY MOUTH DAILY.   ipratropium-albuterol 0.5-2.5 (3) MG/3ML Soln Commonly known as: DUONEB Take 3 mLs by nebulization every 6 (six) hours as needed. What changed: reasons to take this   metoprolol tartrate 25 MG tablet Commonly known as: LOPRESSOR TAKE 1 TABLET BY MOUTH DAILY.   pantoprazole 40 MG tablet Commonly known as: PROTONIX Take 1 tablet (40 mg total) by mouth 2 (two) times daily before a meal.   sertraline 50 MG tablet Commonly known as: ZOLOFT Take 1 tablet (50 mg total) by mouth daily.   Systane Balance 0.6 % Soln Generic drug: Propylene Glycol Place 1 drop into both  eyes daily as needed (dry eyes).   vitamin C 1000 MG tablet Take 1,000 mg by mouth daily.   warfarin 5 MG tablet Commonly known as: COUMADIN Take as directed. If you are unsure how to take this medication, talk to your nurse or doctor. Original instructions: Take 1 to 2 tablets by mouth daily as directed by the coumadin clinic Start taking on: May 13, 2022 What changed: These instructions start on  May 13, 2022. If you are unsure what to do until then, ask your doctor or other care provider.        Allergies  Allergen Reactions   Hydrocodone-Acetaminophen Other (See Comments)    UNK reaction   Tramadol     Loss of control    Consultations: Gastroenterology   Procedures/Studies: DG Abd 1 View  Result Date: 05/10/2022 CLINICAL DATA:  Cough and LEFT lower quadrant abdominal pain EXAM: ABDOMEN - 1 VIEW COMPARISON:  Portable exam 1120 hours without priors for comparison FINDINGS: Nonobstructive bowel gas pattern. Increased stool in rectum. No bowel dilatation or wall thickening. Lung bases grossly clear. Bones demineralized with BILATERAL hip prostheses. IMPRESSION: Increased stool in rectum. Electronically Signed   By: Lavonia Dana M.D.   On: 05/10/2022 12:14   DG Chest 1 View  Result Date: 05/10/2022 CLINICAL DATA:  Cough, LEFT lower quadrant pain EXAM: CHEST  1 VIEW COMPARISON:  Portable exam 1118 hours compared to 08/11/2021 FINDINGS: Borderline enlargement of cardiac silhouette. Mediastinal contours and pulmonary vascularity normal. Atherosclerotic calcification aorta. Decreased interstitial thickening since previous exam. Lungs clear. No pulmonary infiltrate, pleural effusion, or pneumothorax. Bones demineralized. IMPRESSION: No acute abnormalities. Aortic Atherosclerosis (ICD10-I70.0). Electronically Signed   By: Lavonia Dana M.D.   On: 05/10/2022 11:57   (Echo, Carotid, EGD, Colonoscopy, ERCP)    Subjective: No complaints  Discharge Exam: Vitals:   05/11/22 0642 05/11/22 0845  BP: 129/76 132/62  Pulse: 88 85  Resp: 18   Temp: 98.6 F (37 C) 98.1 F (36.7 C)  SpO2: 95% 92%   Vitals:   05/10/22 1808 05/10/22 2211 05/11/22 0642 05/11/22 0845  BP:  133/62 129/76 132/62  Pulse:  76 88 85  Resp:  18 18   Temp:  98.7 F (37.1 C) 98.6 F (37 C) 98.1 F (36.7 C)  TempSrc:  Oral  Oral  SpO2: 92% 94% 95% 92%  Weight:      Height:        General: Pt is alert,  awake, not in acute distress Cardiovascular: RRR, S1/S2 +, no rubs, no gallops Respiratory: CTA bilaterally, no wheezing, no rhonchi Abdominal: Soft, NT, ND, bowel sounds + Extremities: no edema, no cyanosis    The results of significant diagnostics from this hospitalization (including imaging, microbiology, ancillary and laboratory) are listed below for reference.     Microbiology: Recent Results (from the past 240 hour(s))  Respiratory (~20 pathogens) panel by PCR     Status: Abnormal   Collection Time: 05/10/22  9:41 AM   Specimen: Nasopharyngeal Swab; Respiratory  Result Value Ref Range Status   Adenovirus NOT DETECTED NOT DETECTED Final   Coronavirus 229E NOT DETECTED NOT DETECTED Final    Comment: (NOTE) The Coronavirus on the Respiratory Panel, DOES NOT test for the novel  Coronavirus (2019 nCoV)    Coronavirus HKU1 NOT DETECTED NOT DETECTED Final   Coronavirus NL63 NOT DETECTED NOT DETECTED Final   Coronavirus OC43 DETECTED (A) NOT DETECTED Final   Metapneumovirus NOT DETECTED NOT DETECTED Final   Rhinovirus /  Enterovirus NOT DETECTED NOT DETECTED Final   Influenza A NOT DETECTED NOT DETECTED Final   Influenza B NOT DETECTED NOT DETECTED Final   Parainfluenza Virus 1 NOT DETECTED NOT DETECTED Final   Parainfluenza Virus 2 NOT DETECTED NOT DETECTED Final   Parainfluenza Virus 3 NOT DETECTED NOT DETECTED Final   Parainfluenza Virus 4 NOT DETECTED NOT DETECTED Final   Respiratory Syncytial Virus NOT DETECTED NOT DETECTED Final   Bordetella pertussis NOT DETECTED NOT DETECTED Final   Bordetella Parapertussis NOT DETECTED NOT DETECTED Final   Chlamydophila pneumoniae NOT DETECTED NOT DETECTED Final   Mycoplasma pneumoniae NOT DETECTED NOT DETECTED Final    Comment: Performed at [facility], [place] [address] [place], [place] [address]     Labs: BNP (last 3 results) No results for input(s): "BNP" in the last 8760 hours. Basic Metabolic Panel: Recent Labs  Lab  05/07/22 1030  NA 140  K 4.0  CL 108  CO2 29  GLUCOSE 117*  BUN 24*  CREATININE 0.81  CALCIUM 8.9   Liver Function Tests: Recent Labs  Lab 05/07/22 1030  AST 21  ALT 14  ALKPHOS 209*  BILITOT 0.4  PROT 6.2*  ALBUMIN 3.5   Recent Labs  Lab 05/07/22 1030  LIPASE 39   No results for input(s): "AMMONIA" in the last 168 hours. CBC: Recent Labs  Lab 05/07/22 1030 05/08/22 0130 05/09/22 0955 05/10/22 0402 05/11/22 0515  WBC 10.6* 7.3 8.0 7.3 7.6  NEUTROABS 8.6*  --   --   --   --   HGB 11.5* 9.0* 9.5* 9.5* 9.7*  HCT 34.8* 28.3* 30.0* 29.1* 30.9*  MCV 91.1 91.9 92.0 90.4 92.8  PLT 186 168 166 144* 171   Cardiac Enzymes: No results for input(s): "CKTOTAL", "CKMB", "CKMBINDEX", "TROPONINI" in the last 168 hours. BNP: Invalid input(s): "POCBNP" CBG: No results for input(s): "GLUCAP" in the last 168 hours. D-Dimer No results for input(s): "DDIMER" in the last 72 hours. Hgb A1c No results for input(s): "HGBA1C" in the last 72 hours. Lipid Profile No results for input(s): "CHOL", "HDL", "LDLCALC", "TRIG", "CHOLHDL", "LDLDIRECT" in the last 72 hours. Thyroid function studies No results for input(s): "TSH", "T4TOTAL", "T3FREE", "THYROIDAB" in the last 72 hours.  Invalid input(s): "FREET3" Anemia work up No results for input(s): "VITAMINB12", "FOLATE", "FERRITIN", "TIBC", "IRON", "RETICCTPCT" in the last 72 hours. Urinalysis    Component Value Date/Time   BILIRUBINUR n 10/06/2015 1034   PROTEINUR 1+ 10/06/2015 1034   UROBILINOGEN 1.0 10/06/2015 1034   NITRITE n 10/06/2015 1034   LEUKOCYTESUR Negative 10/06/2015 1034   Sepsis Labs Recent Labs  Lab 05/08/22 0130 05/09/22 0955 05/10/22 0402 05/11/22 0515  WBC 7.3 8.0 7.3 7.6   Microbiology Recent Results (from the past 240 hour(s))  Respiratory (~20 pathogens) panel by PCR     Status: Abnormal   Collection Time: 05/10/22  9:41 AM   Specimen: Nasopharyngeal Swab; Respiratory  Result Value Ref Range  Status   Adenovirus NOT DETECTED NOT DETECTED Final   Coronavirus 229E NOT DETECTED NOT DETECTED Final    Comment: (NOTE) The Coronavirus on the Respiratory Panel, DOES NOT test for the novel  Coronavirus (2019 nCoV)    Coronavirus HKU1 NOT DETECTED NOT DETECTED Final   Coronavirus NL63 NOT DETECTED NOT DETECTED Final   Coronavirus OC43 DETECTED (A) NOT DETECTED Final   Metapneumovirus NOT DETECTED NOT DETECTED Final   Rhinovirus / Enterovirus NOT DETECTED NOT DETECTED Final   Influenza A NOT DETECTED  NOT DETECTED Final   Influenza B NOT DETECTED NOT DETECTED Final   Parainfluenza Virus 1 NOT DETECTED NOT DETECTED Final   Parainfluenza Virus 2 NOT DETECTED NOT DETECTED Final   Parainfluenza Virus 3 NOT DETECTED NOT DETECTED Final   Parainfluenza Virus 4 NOT DETECTED NOT DETECTED Final   Respiratory Syncytial Virus NOT DETECTED NOT DETECTED Final   Bordetella pertussis NOT DETECTED NOT DETECTED Final   Bordetella Parapertussis NOT DETECTED NOT DETECTED Final   Chlamydophila pneumoniae NOT DETECTED NOT DETECTED Final   Mycoplasma pneumoniae NOT DETECTED NOT DETECTED Final    Comment: Performed at [facility], [place] [address] [place], [place] [address]    SIGNED:   [name], MD  [facility] 05/11/2022, 9:27 AM Pager   If 7PM-7AM, please contact night-coverage [email or web address] Password [email or web address]

## 2022-05-11 NOTE — Progress Notes (Signed)
PT Cancellation Note  Patient Details Name: Teresa Frey MRN: [id number] DOB: 1930/05/09   Cancelled Treatment:    Reason Eval/Treat Not Completed: Patient declined, no reason specified  States she does not need physical therapy and she is going home today. Encouraged to have daughter assist her with navigating stairs at d/c due to difficulty with task experienced yesterday. Declines further training.  Candie Mile, PT, DPT Physical Therapist Acute Rehabilitation Services [place] [facility]  05/11/2022, 10:01 AM

## 2022-05-13 ENCOUNTER — Encounter (HOSPITAL_COMMUNITY): Payer: Self-pay | Admitting: Gastroenterology

## 2022-05-14 ENCOUNTER — Telehealth: Payer: Self-pay

## 2022-05-14 NOTE — Patient Outreach (Signed)
  Care Coordination TOC Note Transition Care Management Follow-up Telephone Call Date of discharge and from where: 12/08/23[facility]  Dx: "GI Bleed" How have you been since you were released from the hospital? Patient reports he is doing fairly well. She voices that her stools have "returned to normal color." She is eating well and having no GI sxs. Any questions or concerns? Yes--Patient reports that her arm where IV was located is still swollen and sore. She denies any IV issues/complications while hospitalized. Provided non-pharmacological measures to try and s/s to monitor for worsening condition.  Items Reviewed: Did the pt receive and understand the discharge instructions provided? Yes  Medications obtained and verified? Yes  Other? Yes  Any new allergies since your discharge? No  Dietary orders reviewed? Yes Do you have support at home? Yes   Home Care and Equipment/Supplies: Were home health services ordered? yes If so, what is the name of the agency? [facility]  Has the agency set up a time to come to the patient's home? No-Patient had services prior to admission-states she will call them this afternoon if no one ha called her by then Were any new equipment or medical supplies ordered?  No What is the name of the medical supply agency? N/A Were you able to get the supplies/equipment? not applicable Do you have any questions related to the use of the equipment or supplies? No  Functional Questionnaire: (I = Independent and D = Dependent) ADLs: A  Bathing/Dressing- A  Meal Prep- A  Eating- I  Maintaining continence- I  Transferring/Ambulation- I  Managing Meds- A  Follow up appointments reviewed:  PCP Hospital f/u appt confirmed?  Patient discharged Friday afternoon-she will call office to make an appt today  . [facility] f/u appt confirmed? N/A . Are transportation arrangements needed? No  If their condition worsens, is the pt aware to call PCP or go  to the Emergency Dept.? Yes Was the patient provided with contact information for the PCP's office or ED? Yes Was to pt encouraged to call back with questions or concerns? Yes  SDOH assessments and interventions completed:   Yes SDOH Interventions Today    Flowsheet Row Most Recent Value  SDOH Interventions   Food Insecurity Interventions Intervention Not Indicated  Transportation Interventions Intervention Not Indicated       Care Coordination Interventions:  Education provided    Encounter Outcome:  Pt. Visit Completed     Enzo [facility], RN,BSN,CCM [facility] Telephonic Care Management Coordinator Direct Phone: [phone] Toll Free: [phone] Fax: [phone]

## 2022-05-15 ENCOUNTER — Other Ambulatory Visit: Payer: Self-pay | Admitting: Family Medicine

## 2022-06-05 ENCOUNTER — Ambulatory Visit: Payer: Medicare Other | Attending: Cardiology

## 2022-06-05 DIAGNOSIS — Z7901 Long term (current) use of anticoagulants: Secondary | ICD-10-CM | POA: Diagnosis not present

## 2022-06-05 DIAGNOSIS — I4891 Unspecified atrial fibrillation: Secondary | ICD-10-CM | POA: Diagnosis not present

## 2022-06-05 DIAGNOSIS — Z5181 Encounter for therapeutic drug level monitoring: Secondary | ICD-10-CM | POA: Diagnosis not present

## 2022-06-05 LAB — POCT INR: INR: 3.3 — AB (ref 2.0–3.0)

## 2022-06-05 NOTE — Patient Instructions (Signed)
HOLD WEDNESDAY ONLY THEN Continue warfarin 1.5 tablets daily, except 2 tablets each Monday and Friday.   Repeat INR in 3 weeks. [facility] [phone]

## 2022-06-26 ENCOUNTER — Ambulatory Visit: Payer: Medicare Other | Attending: Cardiology | Admitting: *Deleted

## 2022-06-26 DIAGNOSIS — Z7901 Long term (current) use of anticoagulants: Secondary | ICD-10-CM

## 2022-06-26 DIAGNOSIS — I4891 Unspecified atrial fibrillation: Secondary | ICD-10-CM | POA: Diagnosis not present

## 2022-06-26 LAB — POCT INR: INR: 3.6 — AB (ref 2.0–3.0)

## 2022-06-26 NOTE — Patient Instructions (Signed)
Description   HOLD WEDNESDAY ONLY THEN START warfarin 1.5 tablets daily, except 2 tablets each Monday. Repeat INR in 3 weeks. [facility]madin Clinic [phone]

## 2022-07-18 ENCOUNTER — Ambulatory Visit: Payer: Medicare Other | Attending: Cardiology | Admitting: *Deleted

## 2022-07-18 DIAGNOSIS — I4891 Unspecified atrial fibrillation: Secondary | ICD-10-CM

## 2022-07-18 DIAGNOSIS — Z7901 Long term (current) use of anticoagulants: Secondary | ICD-10-CM | POA: Diagnosis not present

## 2022-07-18 LAB — POCT INR: INR: 1.7 — AB (ref 2.0–3.0)

## 2022-07-18 NOTE — Patient Instructions (Signed)
Description   Today take another 1/2 tablet of warfarin then continue warfarin 1.5 tablets daily, except 2 tablets each Monday. Repeat INR in 3 weeks. Coumadin Clinic [phone]

## 2022-08-02 ENCOUNTER — Other Ambulatory Visit: Payer: Self-pay | Admitting: Cardiology

## 2022-08-02 DIAGNOSIS — I4891 Unspecified atrial fibrillation: Secondary | ICD-10-CM

## 2022-08-02 NOTE — Telephone Encounter (Signed)
Warfarin '5mg'$  refill Afib Last INR 07/18/22 Last OV 04/30/22

## 2022-08-08 ENCOUNTER — Ambulatory Visit: Payer: Medicare Other | Attending: Internal Medicine | Admitting: *Deleted

## 2022-08-08 DIAGNOSIS — Z7901 Long term (current) use of anticoagulants: Secondary | ICD-10-CM | POA: Diagnosis not present

## 2022-08-08 DIAGNOSIS — I4891 Unspecified atrial fibrillation: Secondary | ICD-10-CM | POA: Diagnosis not present

## 2022-08-08 LAB — POCT INR: INR: 4.3 — AB (ref 2.0–3.0)

## 2022-08-08 NOTE — Patient Instructions (Addendum)
Description   Do not take any warfarin tomorrow on Thursday and Friday take 1/2 tablet then warfarin 1.5 tablets daily, except 2 tablets each Monday. Repeat INR in 3 weeks. Coumadin Clinic [phone]

## 2022-08-29 ENCOUNTER — Ambulatory Visit: Payer: Medicare Other | Attending: Internal Medicine

## 2022-08-29 DIAGNOSIS — Z7901 Long term (current) use of anticoagulants: Secondary | ICD-10-CM | POA: Diagnosis not present

## 2022-08-29 DIAGNOSIS — I4891 Unspecified atrial fibrillation: Secondary | ICD-10-CM | POA: Diagnosis not present

## 2022-08-29 LAB — POCT INR: INR: 2 (ref 2.0–3.0)

## 2022-08-29 NOTE — Patient Instructions (Signed)
Description   Continue on same dosage of Warfarin 1.5 tablets daily, except 2 tablets on Mondays.  Repeat INR in 4 weeks. Coumadin Clinic [phone]

## 2022-09-14 ENCOUNTER — Other Ambulatory Visit: Payer: Self-pay | Admitting: Family Medicine

## 2022-09-14 DIAGNOSIS — J449 Chronic obstructive pulmonary disease, unspecified: Secondary | ICD-10-CM

## 2022-09-26 ENCOUNTER — Ambulatory Visit: Payer: Medicare Other | Attending: Internal Medicine | Admitting: Pharmacist

## 2022-09-26 DIAGNOSIS — Z7901 Long term (current) use of anticoagulants: Secondary | ICD-10-CM | POA: Diagnosis not present

## 2022-09-26 DIAGNOSIS — I4891 Unspecified atrial fibrillation: Secondary | ICD-10-CM

## 2022-09-26 LAB — POCT INR: INR: 3.7 — AB (ref 2.0–3.0)

## 2022-09-26 NOTE — Patient Instructions (Addendum)
Description   Hold dose tomorrow and then continue on same dosage of Warfarin 1.5 tablets daily, except 2 tablets on Mondays.  Repeat INR in 3 weeks. Coumadin Clinic [phone]

## 2022-10-15 ENCOUNTER — Telehealth: Payer: Self-pay | Admitting: Family Medicine

## 2022-10-15 NOTE — Telephone Encounter (Signed)
Contacted Teresa Frey to schedule their annual wellness visit. Appointment made for 10/25/22.  [name] AWV direct phone # [phone]  Due to schedule change moved awv from [facility] schedule to [employer]

## 2022-10-16 ENCOUNTER — Other Ambulatory Visit: Payer: Self-pay | Admitting: Family Medicine

## 2022-10-17 ENCOUNTER — Ambulatory Visit: Payer: Medicare Other | Attending: Cardiovascular Disease

## 2022-10-17 DIAGNOSIS — I4891 Unspecified atrial fibrillation: Secondary | ICD-10-CM

## 2022-10-17 DIAGNOSIS — Z7901 Long term (current) use of anticoagulants: Secondary | ICD-10-CM

## 2022-10-17 LAB — POCT INR: INR: 4.3 — AB (ref 2.0–3.0)

## 2022-10-17 NOTE — Patient Instructions (Signed)
Description   Hold dose tomorrow and then START taking Warfarin 1.5 tablets daily Repeat INR in 3 weeks.  [facility] [phone]

## 2022-10-19 NOTE — Progress Notes (Unsigned)
HPI: Teresa Frey is a 89 y.o. female, who is here today for chronic disease management.  Last seen on 08/22/21  Hypertension: She is on HCTZ 12.5 mg daily and Metoprolol tartrate 25 mg bid. Atrial fibrillation and sees Dr. Henderson [place] annually. She is on Coumadin, she attends the [facility]. Negative for unusual or severe headache, visual changes, exertional chest pain, worsening dyspnea,  focal weakness, or edema.  Lab Results  Component Value Date   CREATININE 0.81 05/07/2022   BUN 24 (H) 05/07/2022   NA 140 05/07/2022   K 4.0 05/07/2022   CL 108 05/07/2022   CO2 29 05/07/2022   Anxiety: She takes sertraline 50 mg, which remains effective.She has been on Sertraline for may years.  COPD: She uses an albuterol inhaler at least once a week for wheezing and requests a refill.She uses Albuterol inh a couple times per week for wheezing and cough.  Hospitalized in 05/2022 for upper GI bleed/PUD. She is not longer on PPI. Denies abdominal pain, nausea, vomiting, changes in bowel habits, blood in stool or melena.  Hx of right breast cancer, 01/2016, s/p right breast lumpectomy, completed 5 years of Anastrozole 02/2021. She followed with Dr [name] last on 10/06/21.   Review of Systems  Constitutional:  Negative for chills and fever.  HENT:  Negative for nosebleeds and sore throat.   Respiratory:  Negative for cough and wheezing.   Genitourinary:  Negative for decreased urine volume, dysuria and hematuria.  Skin:  Negative for rash.  Neurological:  Negative for syncope and facial asymmetry.  Psychiatric/Behavioral:  Negative for confusion and hallucinations.   See other pertinent positives and negatives in HPI.  Current Outpatient Medications on File Prior to Visit  Medication Sig Dispense Refill   acetaminophen (TYLENOL) 500 MG tablet Take 500 mg by mouth daily.     Ascorbic Acid (VITAMIN C) 1000 MG tablet Take 1,000 mg by mouth daily.     BIOTIN 5000 PO Take 5,000 mcg by mouth  daily.     cholecalciferol (VITAMIN D3) 25 MCG (1000 UNIT) tablet Take 1,000 Units by mouth daily.     metoprolol tartrate (LOPRESSOR) 25 MG tablet TAKE 1 TABLET BY MOUTH DAILY. (Patient taking differently: Take 25 mg by mouth daily.) 90 tablet 4   Multiple Vitamins-Minerals (CENTRUM SILVER ADULT 50+) TABS Take 1 tablet by mouth daily.     Propylene Glycol (SYSTANE BALANCE) 0.6 % SOLN Place 1 drop into both eyes daily as needed (dry eyes).     sertraline (ZOLOFT) 50 MG tablet TAKE 1 TABLET (50 MG TOTAL) BY MOUTH DAILY. 90 tablet 1   vitamin B-12 (CYANOCOBALAMIN) 1000 MCG tablet Take 1,000 mcg by mouth daily.     warfarin (COUMADIN) 5 MG tablet Take 1 and 1/2 to 2 tablets by mouth daily as directed by the coumadin clinic 160 tablet 1   pantoprazole (PROTONIX) 40 MG tablet Take 1 tablet (40 mg total) by mouth 2 (two) times daily before a meal. 60 tablet 2   No current facility-administered medications on file prior to visit.    Past Medical History:  Diagnosis Date   ANXIETY 04/07/2007   Breast cancer of upper-outer quadrant of right female breast (HCC) 01/11/2016   COLONIC POLYPS, HX OF 04/02/2007   adenomatous   DIVERTICULITIS, HX OF 04/02/2007   Diverticulosis    DJD (degenerative joint disease)    Hemorrhoids    HYPERTENSION 04/02/2007   OSTEOARTHRITIS, SEVERE 04/07/2007   Paget's disease of bone  lumbar and sacrum   WEIGHT GAIN 11/13/2007   Allergies  Allergen Reactions   Hydrocodone-Acetaminophen Other (See Comments)    UNK reaction   Tramadol     Loss of control    Social History   Socioeconomic History   Marital status: Married    Spouse name: Not on file   Number of children: 2   Years of education: Not on file   Highest education level: Not on file  Occupational History   Occupation: retired  Tobacco Use   Smoking status: Former    Packs/day: 1.00    Years: 27.00    Additional pack years: 0.00    Total pack years: 27.00    Types: Cigarettes    Quit date:  06/04/1978    Years since quitting: 44.4   Smokeless tobacco: Never  Vaping Use   Vaping Use: Never used  Substance and Sexual Activity   Alcohol use: Yes    Alcohol/week: 7.0 standard drinks of alcohol    Types: 7 Glasses of wine per week    Comment: 1-2 glasses wine after dinner, but not necessarily each day   Drug use: No   Sexual activity: Not on file  Other Topics Concern   Not on file  Social History Narrative   Not on file   Social Determinants of Health   Financial Resource Strain: Low Risk  (10/23/2021)   Overall Financial Resource Strain (CARDIA)    Difficulty of Paying Living Expenses: Not hard at all  Food Insecurity: No Food Insecurity (05/14/2022)   Hunger Vital Sign    Worried About Running Out of Food in the Last Year: Never true    Ran Out of Food in the Last Year: Never true  Transportation Needs: No Transportation Needs (05/14/2022)   PRAPARE - Administrator, Civil Service (Medical): No    Lack of Transportation (Non-Medical): No  Physical Activity: Insufficiently Active (10/23/2021)   Exercise Vital Sign    Days of Exercise per Week: 5 days    Minutes of Exercise per Session: 20 min  Stress: No Stress Concern Present (10/23/2021)   [employer] of Occupational Health - Occupational Stress Questionnaire    Feeling of Stress : Not at all  Social Connections: Moderately Integrated (10/23/2021)   Social Connection and Isolation Panel [NHANES]    Frequency of Communication with Friends and Family: More than three times a week    Frequency of Social Gatherings with Friends and Family: More than three times a week    Attends Religious Services: More than 4 times per year    Active Member of [employer] or Organizations: Yes    Attends Banker Meetings: More than 4 times per year    Marital Status: Widowed   Vitals:   10/22/22 1116  BP: 120/74  Pulse: 67  Resp: 16  SpO2: 95%   Body mass index is 29.45 kg/m.  Physical Exam Vitals  and nursing note reviewed.  Constitutional:      General: She is not in acute distress.    Appearance: She is well-developed.  HENT:     Head: Normocephalic and atraumatic.     Mouth/Throat:     Mouth: Mucous membranes are moist.     Pharynx: Oropharynx is clear.  Eyes:     Conjunctiva/sclera: Conjunctivae normal.  Cardiovascular:     Rate and Rhythm: Normal rate and regular rhythm.     Heart sounds: No murmur heard.    Comments: DP  pulses palpable. Trace pitting LE edema, bilateral. Pulmonary:     Effort: Pulmonary effort is normal. No respiratory distress.     Breath sounds: Normal breath sounds.  Abdominal:     Palpations: Abdomen is soft. There is no hepatomegaly or mass.     Tenderness: There is no abdominal tenderness.  Lymphadenopathy:     Cervical: No cervical adenopathy.  Skin:    General: Skin is warm.     Findings: No erythema or rash.  Neurological:     General: No focal deficit present.     Mental Status: She is alert and oriented to person, place, and time.     Gait: Gait normal.     Comments: Gait assisted with a cane and daughter.  Psychiatric:        Mood and Affect: Mood and affect normal.   ASSESSMENT AND PLAN:  [name] was seen today for medical management of chronic issues.  Diagnoses and all orders for this visit: Lab Results  Component Value Date   CREATININE 0.82 10/22/2022   BUN 25 (H) 10/22/2022   NA 142 10/22/2022   K 4.4 10/22/2022   CL 106 10/22/2022   CO2 28 10/22/2022   Chronic obstructive pulmonary disease, unspecified COPD type (HCC) Assessment & Plan: She is having mild symptoms a couple times per week, Albuterol inh helps. Options discussed: Spiriva or a LABA/ICS.  She prefers not to add a daily inhaler.  Orders: -     Albuterol Sulfate HFA; Inhale 2 puffs into the lungs every 6 (six) hours as needed for wheezing or shortness of breath. Please call [phone] to schedule follow up appointment.  Dispense: 18 g; Refill:  3  Essential hypertension Assessment & Plan: BP adequately controlled. Continue metoprolol tartrate 25 mg twice daily and HCTZ 12.5 mg daily. Continue low-salt diet. Eye exam is current.  Orders: -     hydroCHLOROthiazide; Take 1 capsule (12.5 mg total) by mouth daily. Please call [phone] to schedule follow up appointment.  Dispense: 90 capsule; Refill: 3 -     Basic metabolic panel; Future  Paroxysmal atrial fibrillation (HCC) Assessment & Plan: Rate and rhythm control. Currently on Metoprolol tartrate 25 mg bid and Coumadin. Follows with cardiologist annually.   Anxiety disorder, unspecified type Assessment & Plan: Problem is stable and well controlled. Continue Sertraline 50 mg daily. Follow up in a year, before if needed.  Orders: -     Sertraline HCl; Take 1 tablet (50 mg total) by mouth daily.  Dispense: 90 tablet; Refill: 3  Malignant neoplasm of upper-outer quadrant of right breast in female, estrogen receptor positive (HCC) Assessment & Plan: Hx of right breast cancer, 01/2016, s/p right breast lumpectomy, completed 5 years of Anastrozole 02/2021.  Last mammogram 03/2022.   Return in about 1 year (around 10/22/2023) for chronic problems.  Harbert Fitterer G. [place], MD  [facility]. [place] office.

## 2022-10-22 ENCOUNTER — Ambulatory Visit (INDEPENDENT_AMBULATORY_CARE_PROVIDER_SITE_OTHER): Payer: Medicare Other | Admitting: Family Medicine

## 2022-10-22 ENCOUNTER — Encounter: Payer: Self-pay | Admitting: Family Medicine

## 2022-10-22 VITALS — BP 120/74 | HR 67 | Resp 16 | Ht 62.0 in | Wt 161.0 lb

## 2022-10-22 DIAGNOSIS — I10 Essential (primary) hypertension: Secondary | ICD-10-CM

## 2022-10-22 DIAGNOSIS — Z170 Estrogen receptor positive status [ER+]: Secondary | ICD-10-CM | POA: Diagnosis not present

## 2022-10-22 DIAGNOSIS — J449 Chronic obstructive pulmonary disease, unspecified: Secondary | ICD-10-CM

## 2022-10-22 DIAGNOSIS — I480 Paroxysmal atrial fibrillation: Secondary | ICD-10-CM | POA: Diagnosis not present

## 2022-10-22 DIAGNOSIS — C50411 Malignant neoplasm of upper-outer quadrant of right female breast: Secondary | ICD-10-CM | POA: Diagnosis not present

## 2022-10-22 DIAGNOSIS — F419 Anxiety disorder, unspecified: Secondary | ICD-10-CM | POA: Diagnosis not present

## 2022-10-22 LAB — BASIC METABOLIC PANEL
BUN: 25 mg/dL — ABNORMAL HIGH (ref 6–23)
CO2: 28 mEq/L (ref 19–32)
Calcium: 9.7 mg/dL (ref 8.4–10.5)
Chloride: 106 mEq/L (ref 96–112)
Creatinine, Ser: 0.82 mg/dL (ref 0.40–1.20)
GFR: 62.65 mL/min (ref >60.00)
Glucose, Bld: 94 mg/dL (ref 70–99)
Potassium: 4.4 mEq/L (ref 3.5–5.1)
Sodium: 142 mEq/L (ref 135–145)

## 2022-10-22 MED ORDER — ALBUTEROL SULFATE HFA 108 (90 BASE) MCG/ACT IN AERS: 2.0000 | INHALATION_SPRAY | Freq: Four times a day (QID) | RESPIRATORY_TRACT | 3 refills | Status: DC | PRN

## 2022-10-22 MED ORDER — HYDROCHLOROTHIAZIDE 12.5 MG PO CAPS: 12.5000 mg | ORAL_CAPSULE | Freq: Every day | ORAL | 3 refills | Status: DC

## 2022-10-22 NOTE — Assessment & Plan Note (Signed)
BP adequately controlled. Continue metoprolol tartrate 25 mg twice daily and HCTZ 12.5 mg daily. Continue low-salt diet. Eye exam is current.

## 2022-10-22 NOTE — Patient Instructions (Addendum)
A few things to remember from today's visit:  Essential hypertension - Plan: Basic metabolic panel  Chronic obstructive pulmonary disease, unspecified COPD type (HCC) - Plan: albuterol (VENTOLIN HFA) 108 (90 Base) MCG/ACT inhaler  Paroxysmal atrial fibrillation (HCC)  Anxiety disorder, unspecified type  No changes today. As far as every thing is stable, I can continue seeing you annually.  If you need refills for medications you take chronically, please call your pharmacy. Do not use My Chart to request refills or for acute issues that need immediate attention. If you send a my chart message, it may take a few days to be addressed, specially if I am not in the office.  Please be sure medication list is accurate. If a new problem present, please set up appointment sooner than planned today.

## 2022-10-22 NOTE — Assessment & Plan Note (Signed)
She is having mild symptoms a couple times per week, Albuterol inh helps. Options discussed: Spiriva or a LABA/ICS.  She prefers not to add a daily inhaler.

## 2022-10-25 ENCOUNTER — Ambulatory Visit (INDEPENDENT_AMBULATORY_CARE_PROVIDER_SITE_OTHER): Payer: Medicare Other | Admitting: Family Medicine

## 2022-10-25 ENCOUNTER — Encounter: Payer: Self-pay | Admitting: Family Medicine

## 2022-10-25 DIAGNOSIS — Z0000 Encounter for general adult medical examination without abnormal findings: Secondary | ICD-10-CM | POA: Diagnosis not present

## 2022-10-25 MED ORDER — SERTRALINE HCL 50 MG PO TABS
50.0000 mg | ORAL_TABLET | Freq: Every day | ORAL | 3 refills | Status: DC
Start: 2022-10-25 — End: 2023-10-15

## 2022-10-25 NOTE — Progress Notes (Signed)
PATIENT CHECK-IN and HEALTH RISK ASSESSMENT QUESTIONNAIRE:  -completed by phone/video for upcoming Medicare Preventive Visit  Pre-Visit Check-in: 1)Vitals (height, wt, BP, etc) - record in vitals section for visit on day of visit 2)Review and Update Medications, Allergies PMH, Surgeries, Social history in Epic 3)Hospitalizations in the last year with date/reason? no 4)Review and Update Care Team (patient's specialists) in Epic 5) Complete PHQ9 in Epic  6) Complete Fall Screening in Epic 7)Review all Health Maintenance Due and order under PCP if not done.  8)Medicare Wellness Questionnaire: Answer theses question about your habits: Do you drink alcohol? yes If yes, how many drinks do you have a day? 1 glass of wine per 1-2 nights Have you ever smoked? Yes Quit date if applicable? 50 years ago  How many packs a day do/did you smoke? 1 pack per day Do you use smokeless tobacco? no Do you use an illicit drugs? no Do you exercises? Yes IF so, what type and how many days/minutes per week? Leg exercises per day for 15 mins, uses cane - she has some balance exercises that she does daily - provided by a physical therapist.  Lives alone but daughter lives next door and is there every day Are you sexually active? No Number of partners? N/A Typical breakfast: slice of toast with peanut butter  Typical lunch: soup or sandwich Typical dinner: typically fast food Typical snacks: mixed nuts  Beverages: water, no soda, wine  Answer theses question about you: Can you perform most household chores? yes Do you find it hard to follow a conversation in a noisy room? no Do you often ask people to speak up or repeat themselves? Wears hearing aids Do you feel that you have a problem with memory? no Do you balance your checkbook and or bank acounts? yes Do you feel safe at home? yes Last dentist visit? "Maybe a year ago" Do you need assistance with any of the following: Please note if so   Driving?  no  Feeding yourself? no  Getting from bed to chair? no  Getting to the toilet? no  Bathing or showering? no  Dressing yourself? no  Managing money? no  Climbing a flight of stairs " I try to avoid stairs" , yes  Preparing meals? no  Do you have Advanced Directives in place (Living Will, Healthcare Power or Attorney)?  yes   Last eye Exam and location? "About a year ago, [facility]"   Do you currently use prescribed or non-prescribed narcotic or opioid pain medications? no  Do you have a history or close family history of breast, ovarian, tubal or peritoneal cancer or a family member with BRCA (breast cancer susceptibility 1 and 2) gene mutations? Breast cancer, daughter and herself  Nurse/Assistant Credentials/time stamp: Laddie Aquas, CMA 10:17 AM   ----------------------------------------------------------------------------------------------------------------------------------------------------------------------------------------------------------------------   MEDICARE ANNUAL PREVENTIVE VISIT WITH PROVIDER: (Welcome to Medicare, initial annual wellness or annual wellness exam)  Virtual Visit via Phone Note  I connected with Teresa Frey on 10/25/22 by phone and verified that I am speaking with the correct person using two identifiers.  Location patient: home Location provider:work or home office Persons participating in the virtual visit: patient, provider  Concerns and/or follow up today: just saw PCP. No concerns today.    See HM section in Epic for other details of completed HM.    ROS: negative for report of fevers, unintentional weight loss, vision changes, vision loss, hearing loss or change, chest pain, sob, hemoptysis, melena, hematochezia, hematuria, falls,  bleeding or bruising, thoughts of suicide or self harm, memory loss  Patient-completed extensive health risk assessment - reviewed and discussed with the patient: See Health Risk  Assessment completed with patient prior to the visit either above or in recent phone note. This was reviewed in detailed with the patient today and appropriate recommendations, orders and referrals were placed as needed per Summary below and patient instructions.   Review of Medical History: -PMH, PSH, Family History and current specialty and care providers reviewed and updated and listed below   Patient Care Team: [place], [name], MD as PCP - General (Family Medicine) Rollene Rotunda, MD as PCP - Cardiology (Cardiology) Claud Kelp, MD as Consulting Physician (General Surgery) Malachy Mood, MD as Consulting Physician (Hematology) Antony Blackbird, MD as Consulting Physician (Radiation Oncology) Axel Filler, Larna Daughters, NP as Nurse Practitioner (Hematology and Oncology) Verner Chol, [facility] (Inactive) as Pharmacist (Pharmacist)   Past Medical History:  Diagnosis Date   ANXIETY 04/07/2007   Breast cancer of upper-outer quadrant of right female breast (HCC) 01/11/2016   COLONIC POLYPS, HX OF 04/02/2007   adenomatous   DIVERTICULITIS, HX OF 04/02/2007   Diverticulosis    DJD (degenerative joint disease)    Hemorrhoids    HYPERTENSION 04/02/2007   OSTEOARTHRITIS, SEVERE 04/07/2007   Paget's disease of bone    lumbar and sacrum   WEIGHT GAIN 11/13/2007    Past Surgical History:  Procedure Laterality Date   APPENDECTOMY     BREAST LUMPECTOMY WITH RADIOACTIVE SEED LOCALIZATION Right 02/08/2016   Procedure: RIGHT BREAST LUMPECTOMY WITH RADIOACTIVE SEED LOCALIZATION;  Surgeon: Claud Kelp, MD;  Location: [facility];  Service: General;  Laterality: Right;   ESOPHAGOGASTRODUODENOSCOPY (EGD) WITH PROPOFOL N/A 05/09/2022   Procedure: ESOPHAGOGASTRODUODENOSCOPY (EGD) WITH PROPOFOL;  Surgeon: Napoleon Form, MD;  Location: [facility];  Service: Gastroenterology;  Laterality: N/A;   ORIF FEMUR FRACTURE Left 04/21/2018   Procedure: OPEN REDUCTION INTERNAL FIXATION (ORIF) PERI  PROSTHETIC FEMUR FRACTURE;  Surgeon: Tarry Kos, MD;  Location: [facility] OR;  Service: Orthopedics;  Laterality: Left;   TONSILLECTOMY     TOTAL HIP ARTHROPLASTY  1999, left hip    2006 right hip   TUBAL LIGATION      Social History   Socioeconomic History   Marital status: Married    Spouse name: Not on file   Number of children: 2   Years of education: Not on file   Highest education level: Not on file  Occupational History   Occupation: retired  Tobacco Use   Smoking status: Former    Packs/day: 1.00    Years: 27.00    Additional pack years: 0.00    Total pack years: 27.00    Types: Cigarettes    Quit date: 06/04/1978    Years since quitting: 44.4   Smokeless tobacco: Never  Vaping Use   Vaping Use: Never used  Substance and Sexual Activity   Alcohol use: Yes    Alcohol/week: 7.0 standard drinks of alcohol    Types: 7 Glasses of wine per week    Comment: 1-2 glasses wine after dinner, but not necessarily each day   Drug use: No   Sexual activity: Not on file  Other Topics Concern   Not on file  Social History Narrative   Not on file   Social Determinants of Health   Financial Resource Strain: Low Risk  (10/23/2021)   Overall Financial Resource Strain (CARDIA)    Difficulty of Paying Living Expenses: Not  hard at all  Food Insecurity: No Food Insecurity (05/14/2022)   Hunger Vital Sign    Worried About Running Out of Food in the Last Year: Never true    Ran Out of Food in the Last Year: Never true  Transportation Needs: No Transportation Needs (05/14/2022)   PRAPARE - Administrator, Civil Service (Medical): No    Lack of Transportation (Non-Medical): No  Physical Activity: Insufficiently Active (10/23/2021)   Exercise Vital Sign    Days of Exercise per Week: 5 days    Minutes of Exercise per Session: 20 min  Stress: No Stress Concern Present (10/23/2021)   [employer] of Occupational Health - Occupational Stress Questionnaire    Feeling of Stress  : Not at all  Social Connections: Moderately Integrated (10/23/2021)   Social Connection and Isolation Panel [NHANES]    Frequency of Communication with Friends and Family: More than three times a week    Frequency of Social Gatherings with Friends and Family: More than three times a week    Attends Religious Services: More than 4 times per year    Active Member of [employer] or Organizations: Yes    Attends Banker Meetings: More than 4 times per year    Marital Status: Widowed  Intimate Partner Violence: Not At Risk (10/23/2021)   Humiliation, Afraid, Rape, and Kick questionnaire    Fear of Current or Ex-Partner: No    Emotionally Abused: No    Physically Abused: No    Sexually Abused: No    Family History  Problem Relation Age of Onset   Pancreatic cancer Mother 24       d. 43   Cancer Father 65       hodgkin's lymphoma and leukemia; d. 65   Colon cancer Brother        dx. late 53s; smoker   Pancreatic cancer Brother 14       d. 61; smoker   Lung cancer Brother 14       smoker   Heart attack Brother        d. late 59s   Colon cancer Paternal Aunt    Breast cancer Daughter 77       negative genetic testing in 2016   Skin cancer Daughter        non-melanoma type; +sun exposure   Kidney failure Son 36       +EtOH abuse resulting in liver, kidney, and pancreas failure   Diabetes Maternal Uncle        d. later age   Heart disease Neg Hx        family   Esophageal cancer Neg Hx    Rectal cancer Neg Hx    Stomach cancer Neg Hx     Current Outpatient Medications on File Prior to Visit  Medication Sig Dispense Refill   acetaminophen (TYLENOL) 500 MG tablet Take 500 mg by mouth daily.     albuterol (VENTOLIN HFA) 108 (90 Base) MCG/ACT inhaler Inhale 2 puffs into the lungs every 6 (six) hours as needed for wheezing or shortness of breath. Please call [phone] to schedule follow up appointment. 18 g 3   Ascorbic Acid (VITAMIN C) 1000 MG tablet Take 1,000 mg by  mouth daily.     BIOTIN 5000 PO Take 5,000 mcg by mouth daily.     cholecalciferol (VITAMIN D3) 25 MCG (1000 UNIT) tablet Take 1,000 Units by mouth daily.     hydrochlorothiazide (MICROZIDE) 12.5 MG  capsule Take 1 capsule (12.5 mg total) by mouth daily. Please call [phone] to schedule follow up appointment. 90 capsule 3   metoprolol tartrate (LOPRESSOR) 25 MG tablet TAKE 1 TABLET BY MOUTH DAILY. (Patient taking differently: Take 25 mg by mouth daily.) 90 tablet 4   Multiple Vitamins-Minerals (CENTRUM SILVER ADULT 50+) TABS Take 1 tablet by mouth daily.     Propylene Glycol (SYSTANE BALANCE) 0.6 % SOLN Place 1 drop into both eyes daily as needed (dry eyes).     sertraline (ZOLOFT) 50 MG tablet TAKE 1 TABLET (50 MG TOTAL) BY MOUTH DAILY. 90 tablet 1   vitamin B-12 (CYANOCOBALAMIN) 1000 MCG tablet Take 1,000 mcg by mouth daily.     warfarin (COUMADIN) 5 MG tablet Take 1 and 1/2 to 2 tablets by mouth daily as directed by the coumadin clinic 160 tablet 1   No current facility-administered medications on file prior to visit.    Allergies  Allergen Reactions   Hydrocodone-Acetaminophen Other (See Comments)    UNK reaction   Tramadol     Loss of control       Physical Exam There were no vitals filed for this visit. Estimated body mass index is 29.45 kg/m as calculated from the following:   Height as of 10/22/22: 5\' 2"  (1.575 m).   Weight as of 10/22/22: 161 lb (73 kg).  EKG (optional): deferred due to virtual visit  GENERAL: alert, oriented, no acute distress detected, full vision exam deferred due to pandemic and/or virtual encounter  PSYCH/NEURO: pleasant and cooperative, no obvious depression or anxiety, speech and thought processing grossly intact, Cognitive function grossly intact  Flowsheet Row Office Visit from 10/25/2022 in [facility] [place]  PHQ-9 Total Score 0           10/25/2022   10:10 AM 10/22/2022   11:28 AM 10/23/2021   11:35 AM  08/22/2021   11:24 AM 05/10/2021   12:01 PM  Depression screen PHQ 2/9  Decreased Interest 0 0 0 0 0  Down, Depressed, Hopeless 0 0 0 0 0  PHQ - 2 Score 0 0 0 0 0  Altered sleeping 0    0  Tired, decreased energy 0    0  Change in appetite 0    0  Feeling bad or failure about yourself  0    0  Trouble concentrating 0    0  Moving slowly or fidgety/restless 0    0  Suicidal thoughts 0    0  PHQ-9 Score 0    0  Difficult doing work/chores Not difficult at all           05/10/2022   10:00 AM 05/10/2022   10:44 PM 05/11/2022    9:00 AM 10/22/2022   11:28 AM 10/25/2022   10:09 AM  Fall Risk  Falls in the past year?    0 0  Was there an injury with Fall?    0 0  Fall Risk Category Calculator    0 0  (RETIRED) Patient Fall Risk Level High fall risk High fall risk High fall risk    Patient at Risk for Falls Due to    Other (Comment) History of fall(s)  Fall risk Follow up    Falls evaluation completed Falls prevention discussed     SUMMARY AND PLAN:  Encounter for Medicare annual wellness exam    Discussed applicable health maintenance/preventive health measures and advised and referred or ordered per patient preferences: -she  declined repeating dexa Health Maintenance  Topic Date Due   INFLUENZA VACCINE  01/03/2023   MAMMOGRAM  03/21/2023   Medicare Annual Wellness (AWV)  10/25/2023   DTaP/Tdap/Td (2 - Td or Tdap) 01/23/2025   Pneumonia Vaccine 30+ Years old  Completed   DEXA SCAN  Completed   Zoster Vaccines- Shingrix  Completed   HPV VACCINES  Aged Out   COLONOSCOPY (Pts 45-66yrs Insurance coverage will need to be confirmed)  Discontinued   COVID-19 Vaccine  Discontinued    Education and counseling on the following was provided based on the above review of health and a plan/checklist for the patient, along with additional information discussed, was provided for the patient in the patient instructions :  -Provided counseling and plan for difficulty hearing - she has  hearing aids and feels fine with them -lives alone but feels safe as daughter is next door and she reports daughter is there daily -reports has plenty to eat and even still goes out to eat -reports she feels steady on her feet and does balance exercises daily -Advised and counseled on a healthy lifestyle - including the importance of a healthy diet, regular physical activity, social connections and stress management. -Reviewed patient's current diet.  A summary of a healthy diet was provided in the Patient Instructions.  -reviewed patient's current physical activity level and discussed exercise guidelines for adults. Encouraged her to include some arm and core chair exercises and discussed some options -Advise yearly dental visits at minimum and regular eye exams -Advised and counseled on alcohol safe limits - she does not drink more than one glass  Follow up: see patient instructions     Patient Instructions  I really enjoyed getting to talk with you today! I am available on Tuesdays and Thursdays for virtual visits if you have any questions or concerns, or if I can be of any further assistance.   CHECKLIST FROM ANNUAL WELLNESS VISIT:  -Follow up (please call to schedule if not scheduled after visit):   -yearly for annual wellness visit with primary care office  Here is a list of your preventive care/health maintenance measures and the plan for each if any are due:  PLAN For any measures below that may be due:   Health Maintenance  Topic Date Due   INFLUENZA VACCINE  01/03/2023   MAMMOGRAM  03/21/2023   Medicare Annual Wellness (AWV)  10/25/2023   DTaP/Tdap/Td (2 - Td or Tdap) 01/23/2025   Pneumonia Vaccine 70+ Years old  Completed   DEXA SCAN  Completed   Zoster Vaccines- Shingrix  Completed   HPV VACCINES  Aged Out   COLONOSCOPY (Pts 45-10yrs Insurance coverage will need to be confirmed)  Discontinued   COVID-19 Vaccine  Discontinued    -See a dentist at least  yearly  -Get your eyes checked and then per your eye specialist's recommendations  -Other issues addressed today:   -I have included below further information regarding a healthy whole foods based diet, physical activity guidelines for adults, stress management and opportunities for social connections. I hope you find this information useful.   -----------------------------------------------------------------------------------------------------------------------------------------------------------------------------------------------------------------------------------------------------------  NUTRITION: -eat real food: lots of colorful vegetables (half the plate) and fruits -5-7 servings of vegetables and fruits per day (fresh or steamed is best), exp. 2 servings of vegetables with lunch and dinner and 2 servings of fruit per day. Berries and greens such as kale and collards are great choices.  -consume on a regular basis: whole grains (make sure first  ingredient on label contains the word "whole"), fresh fruits, fish, nuts, seeds, healthy oils (such as olive oil, avocado oil, grape seed oil) -may eat small amounts of dairy and lean meat on occasion, but avoid processed meats such as ham, bacon, lunch meat, etc. -drink water -try to avoid fast food and pre-packaged foods, processed meat -most experts advise limiting sodium to < 2300mg  per day, should limit further is any chronic conditions such as high blood pressure, heart disease, diabetes, etc. The American Heart Association advised that < 1500mg  is is ideal -try to avoid foods that contain any ingredients with names you do not recognize  -try to avoid sugar/sweets (except for the natural sugar that occurs in fresh fruit) -try to avoid sweet drinks -try to avoid white rice, white bread, pasta (unless whole grain), white or yellow potatoes  EXERCISE GUIDELINES FOR ADULTS: -if you wish to increase your physical activity, do so gradually and  with the approval of your doctor -STOP and seek medical care immediately if you have any chest pain, chest discomfort or trouble breathing when starting or increasing exercise  -move and stretch your body, legs, feet and arms when sitting for long periods -Physical activity guidelines for optimal health in adults: -least 150 minutes per week of aerobic exercise (can talk, but not sing) once approved by your doctor, 20-30 minutes of sustained activity or two 10 minute episodes of sustained activity every day.  -resistance training at least 2 days per week if approved by your doctor -balance exercises 3+ days per week:   Stand somewhere where you have something sturdy to hold onto if you lose balance.    1) lift up on toes, start with 5x per day and work up to 20x   2) stand and lift on leg straight out to the side so that foot is a few inches of the floor, start with 5x each side and work up to 20x each side   3) stand on one foot, start with 5 seconds each side and work up to 20 seconds on each side  If you need ideas or help with getting more active:  -Silver sneakers https://tools.silversneakers.com  -Walk with a Doc: http://www.duncan-williams.com/  -try to include resistance (weight lifting/strength building) and balance exercises twice per week: or the following link for ideas: http://castillo-powell.com/  BuyDucts.dk  STRESS MANAGEMENT: -can try meditating, or just sitting quietly with deep breathing while intentionally relaxing all parts of your body for 5 minutes daily -if you need further help with stress, anxiety or depression please follow up with your primary doctor or contact the wonderful folks at [facility]: [phone]  SOCIAL CONNECTIONS: -options in [place] if you wish to engage in more social and exercise related activities:  -Silver  sneakers https://tools.silversneakers.com  -Walk with a Doc: http://www.duncan-williams.com/  -Check out the [facility] Active Adults 50+ section on the [employer] (hiking clubs, book clubs, cards and games, chess, exercise classes, aquatic classes and much more) - see the website for details: https://www.[place].gov/departments/parks-recreation/active-adults50  -YouTube has lots of exercise videos for different ages and abilities as well  -[facility] (a variety of indoor and outdoor inperson activities for adults). (931)641-8785. [address]  -Virtual Online Classes (a variety of topics): see seniorplanet.org or call [phone]  -consider volunteering at a school, hospice center, church, senior center or elsewhere           Terressa Koyanagi, DO

## 2022-10-25 NOTE — Assessment & Plan Note (Signed)
Problem is stable and well controlled. Continue Sertraline 50 mg daily. Follow up in a year, before if needed.

## 2022-10-25 NOTE — Assessment & Plan Note (Signed)
Rate and rhythm control. Currently on Metoprolol tartrate 25 mg bid and Coumadin. Follows with cardiologist annually.

## 2022-10-25 NOTE — Assessment & Plan Note (Signed)
Hx of right breast cancer, 01/2016, s/p right breast lumpectomy, completed 5 years of Anastrozole 02/2021.  Last mammogram 03/2022.

## 2022-10-25 NOTE — Patient Instructions (Signed)
I really enjoyed getting to talk with you today! I am available on Tuesdays and Thursdays for virtual visits if you have any questions or concerns, or if I can be of any further assistance.   CHECKLIST FROM ANNUAL WELLNESS VISIT:  -Follow up (please call to schedule if not scheduled after visit):   -yearly for annual wellness visit with primary care office  Here is a list of your preventive care/health maintenance measures and the plan for each if any are due:  PLAN For any measures below that may be due:   Health Maintenance  Topic Date Due   INFLUENZA VACCINE  01/03/2023   MAMMOGRAM  03/21/2023   Medicare Annual Wellness (AWV)  10/25/2023   DTaP/Tdap/Td (2 - Td or Tdap) 01/23/2025   Pneumonia Vaccine 43+ Years old  Completed   DEXA SCAN  Completed   Zoster Vaccines- Shingrix  Completed   HPV VACCINES  Aged Out   COLONOSCOPY (Pts 45-30yrs Insurance coverage will need to be confirmed)  Discontinued   COVID-19 Vaccine  Discontinued    -See a dentist at least yearly  -Get your eyes checked and then per your eye specialist's recommendations  -Other issues addressed today:   -I have included below further information regarding a healthy whole foods based diet, physical activity guidelines for adults, stress management and opportunities for social connections. I hope you find this information useful.   -----------------------------------------------------------------------------------------------------------------------------------------------------------------------------------------------------------------------------------------------------------  NUTRITION: -eat real food: lots of colorful vegetables (half the plate) and fruits -5-7 servings of vegetables and fruits per day (fresh or steamed is best), exp. 2 servings of vegetables with lunch and dinner and 2 servings of fruit per day. Berries and greens such as kale and collards are great choices.  -consume on a regular basis:  whole grains (make sure first ingredient on label contains the word "whole"), fresh fruits, fish, nuts, seeds, healthy oils (such as olive oil, avocado oil, grape seed oil) -may eat small amounts of dairy and lean meat on occasion, but avoid processed meats such as ham, bacon, lunch meat, etc. -drink water -try to avoid fast food and pre-packaged foods, processed meat -most experts advise limiting sodium to < 2300mg  per day, should limit further is any chronic conditions such as high blood pressure, heart disease, diabetes, etc. The American Heart Association advised that < 1500mg  is is ideal -try to avoid foods that contain any ingredients with names you do not recognize  -try to avoid sugar/sweets (except for the natural sugar that occurs in fresh fruit) -try to avoid sweet drinks -try to avoid white rice, white bread, pasta (unless whole grain), white or yellow potatoes  EXERCISE GUIDELINES FOR ADULTS: -if you wish to increase your physical activity, do so gradually and with the approval of your doctor -STOP and seek medical care immediately if you have any chest pain, chest discomfort or trouble breathing when starting or increasing exercise  -move and stretch your body, legs, feet and arms when sitting for long periods -Physical activity guidelines for optimal health in adults: -least 150 minutes per week of aerobic exercise (can talk, but not sing) once approved by your doctor, 20-30 minutes of sustained activity or two 10 minute episodes of sustained activity every day.  -resistance training at least 2 days per week if approved by your doctor -balance exercises 3+ days per week:   Stand somewhere where you have something sturdy to hold onto if you lose balance.    1) lift up on toes, start with 5x  per day and work up to 20x   2) stand and lift on leg straight out to the side so that foot is a few inches of the floor, start with 5x each side and work up to 20x each side   3) stand on one  foot, start with 5 seconds each side and work up to 20 seconds on each side  If you need ideas or help with getting more active:  -Silver sneakers https://tools.silversneakers.com  -Walk with a Doc: http://www.duncan-williams.com/  -try to include resistance (weight lifting/strength building) and balance exercises twice per week: or the following link for ideas: http://castillo-powell.com/  BuyDucts.dk  STRESS MANAGEMENT: -can try meditating, or just sitting quietly with deep breathing while intentionally relaxing all parts of your body for 5 minutes daily -if you need further help with stress, anxiety or depression please follow up with your primary doctor or contact the wonderful folks at [facility]: [phone]  SOCIAL CONNECTIONS: -options in [place] if you wish to engage in more social and exercise related activities:  -Silver sneakers https://tools.silversneakers.com  -Walk with a Doc: http://www.duncan-williams.com/  -Check out the [facility] Active Adults 50+ section on the [place] [employer] (hiking clubs, book clubs, cards and games, chess, exercise classes, aquatic classes and much more) - see the website for details: https://www.[place].gov/departments/parks-recreation/active-adults50  -YouTube has lots of exercise videos for different ages and abilities as well  -[facility] (a variety of indoor and outdoor inperson activities for adults). 4848316854. [address]  -Virtual Online Classes (a variety of topics): see seniorplanet.org or call [phone]  -consider volunteering at a school, hospice center, church, senior center or elsewhere

## 2022-11-06 ENCOUNTER — Telehealth: Payer: Self-pay

## 2022-11-06 DIAGNOSIS — I10 Essential (primary) hypertension: Secondary | ICD-10-CM

## 2022-11-06 DIAGNOSIS — J449 Chronic obstructive pulmonary disease, unspecified: Secondary | ICD-10-CM

## 2022-11-06 NOTE — Telephone Encounter (Signed)
-----   Message from Angela H Herring, RPH sent at 11/06/2022  9:24 AM EDT ----- Regarding: 2300-Pharmacy Referral Hello,   Patient is currently flagged as high risk for hospitalization and is eligible to meet with me through the Care Coordination program.   Could a 2300-Pharmacy referral for medication management be placed for the patient so that I can get them scheduled?   Thank you!  Angela H Herring  Clinical Pharmacist  [phone]    

## 2022-11-08 ENCOUNTER — Ambulatory Visit: Payer: Medicare Other | Attending: Cardiology | Admitting: *Deleted

## 2022-11-08 DIAGNOSIS — Z7901 Long term (current) use of anticoagulants: Secondary | ICD-10-CM | POA: Diagnosis not present

## 2022-11-08 DIAGNOSIS — I480 Paroxysmal atrial fibrillation: Secondary | ICD-10-CM

## 2022-11-08 DIAGNOSIS — I4891 Unspecified atrial fibrillation: Secondary | ICD-10-CM | POA: Diagnosis not present

## 2022-11-08 LAB — POCT INR: INR: 2 (ref 2.0–3.0)

## 2022-11-08 NOTE — Patient Instructions (Signed)
Description   Today take another 1/2 tablet of warfarin then continue taking Warfarin 1.5 tablets daily Repeat INR in 3 weeks.  [facility] [phone]

## 2022-11-29 ENCOUNTER — Ambulatory Visit: Payer: Medicare Other | Attending: Cardiology | Admitting: *Deleted

## 2022-11-29 DIAGNOSIS — Z7901 Long term (current) use of anticoagulants: Secondary | ICD-10-CM

## 2022-11-29 DIAGNOSIS — I4891 Unspecified atrial fibrillation: Secondary | ICD-10-CM | POA: Diagnosis not present

## 2022-11-29 DIAGNOSIS — I480 Paroxysmal atrial fibrillation: Secondary | ICD-10-CM

## 2022-11-29 LAB — POCT INR: INR: 3.6 — AB (ref 2.0–3.0)

## 2022-11-29 NOTE — Patient Instructions (Signed)
Description   Do not take any warfarin tomorrow (already taken it today) then continue taking Warfarin 1.5 tablets daily Repeat INR in 3 weeks.  [facility] [phone]

## 2022-12-20 ENCOUNTER — Ambulatory Visit: Payer: Medicare Other

## 2022-12-24 ENCOUNTER — Ambulatory Visit: Payer: Medicare Other | Attending: Cardiology | Admitting: *Deleted

## 2022-12-24 DIAGNOSIS — Z7901 Long term (current) use of anticoagulants: Secondary | ICD-10-CM

## 2022-12-24 DIAGNOSIS — I480 Paroxysmal atrial fibrillation: Secondary | ICD-10-CM

## 2022-12-24 DIAGNOSIS — I4891 Unspecified atrial fibrillation: Secondary | ICD-10-CM

## 2022-12-24 LAB — POCT INR: INR: 1.6 — AB (ref 2.0–3.0)

## 2022-12-24 NOTE — Patient Instructions (Signed)
Description   Today take another 1/2 tablet of warfarin then continue taking Warfarin 1.5 tablets daily Repeat INR in 3 weeks.  [facility] [phone]

## 2023-01-14 ENCOUNTER — Ambulatory Visit: Payer: Medicare Other

## 2023-01-14 DIAGNOSIS — Z7901 Long term (current) use of anticoagulants: Secondary | ICD-10-CM

## 2023-01-14 DIAGNOSIS — I4891 Unspecified atrial fibrillation: Secondary | ICD-10-CM | POA: Diagnosis not present

## 2023-01-14 LAB — POCT INR: INR: 3.3 — AB (ref 2.0–3.0)

## 2023-01-14 NOTE — Patient Instructions (Signed)
Description   Only take 1 tablet tomorrow and then continue taking Warfarin 1.5 tablets daily Repeat INR in 3 weeks.  [facility] [phone]

## 2023-01-21 ENCOUNTER — Ambulatory Visit (INDEPENDENT_AMBULATORY_CARE_PROVIDER_SITE_OTHER): Payer: Medicare Other | Admitting: Student

## 2023-01-21 ENCOUNTER — Ambulatory Visit (INDEPENDENT_AMBULATORY_CARE_PROVIDER_SITE_OTHER): Payer: Medicare Other

## 2023-01-21 ENCOUNTER — Encounter (HOSPITAL_BASED_OUTPATIENT_CLINIC_OR_DEPARTMENT_OTHER): Payer: Self-pay | Admitting: Student

## 2023-01-21 DIAGNOSIS — M1712 Unilateral primary osteoarthritis, left knee: Secondary | ICD-10-CM | POA: Diagnosis not present

## 2023-01-21 DIAGNOSIS — M79652 Pain in left thigh: Secondary | ICD-10-CM | POA: Diagnosis not present

## 2023-01-21 DIAGNOSIS — M79605 Pain in left leg: Secondary | ICD-10-CM | POA: Diagnosis not present

## 2023-01-21 DIAGNOSIS — M25552 Pain in left hip: Secondary | ICD-10-CM

## 2023-01-21 DIAGNOSIS — Z96642 Presence of left artificial hip joint: Secondary | ICD-10-CM | POA: Diagnosis not present

## 2023-01-21 MED ORDER — LIDOCAINE HCL 1 % IJ SOLN
4.0000 mL | INTRAMUSCULAR | Status: AC | PRN
Start: 2023-01-21 — End: 2023-01-21
  Administered 2023-01-21: 4 mL

## 2023-01-21 MED ORDER — TRIAMCINOLONE ACETONIDE 40 MG/ML IJ SUSP
2.0000 mL | INTRAMUSCULAR | Status: AC | PRN
Start: 2023-01-21 — End: 2023-01-21
  Administered 2023-01-21: 2 mL via INTRA_ARTICULAR

## 2023-01-21 NOTE — Progress Notes (Signed)
Chief Complaint: Left hip and knee pain     History of Present Illness:    Teresa Frey is a 89 y.o. female presenting today with left hip and knee pain.  She denies any injury or recent activity that would have caused this.  She states that 3 days ago her left hip became painful, prompting her to begin using her walker for assistance.  Her pain particularly occurs when weightbearing and is moderate in severity.  This is located mainly over the lateral aspect of the hip.  Denies any groin pain.  She also reports that today her knee has become bothersome, and has noticed a knot developing over the superior lateral knee.  She has been taking Tylenol without much benefit.  Also reports some tingling in the left lower leg.   Surgical History:   Periprosthetic Femur Fracture ORIF - 2019  PMH/PSH/Family History/Social History/Meds/Allergies:    Past Medical History:  Diagnosis Date   ANXIETY 04/07/2007   Breast cancer of upper-outer quadrant of right female breast (HCC) 01/11/2016   COLONIC POLYPS, HX OF 04/02/2007   adenomatous   DIVERTICULITIS, HX OF 04/02/2007   Diverticulosis    DJD (degenerative joint disease)    Hemorrhoids    HYPERTENSION 04/02/2007   OSTEOARTHRITIS, SEVERE 04/07/2007   Paget's disease of bone    lumbar and sacrum   WEIGHT GAIN 11/13/2007   Past Surgical History:  Procedure Laterality Date   APPENDECTOMY     BREAST LUMPECTOMY WITH RADIOACTIVE SEED LOCALIZATION Right 02/08/2016   Procedure: RIGHT BREAST LUMPECTOMY WITH RADIOACTIVE SEED LOCALIZATION;  Surgeon: Claud Kelp, MD;  Location: [facility];  Service: General;  Laterality: Right;   ESOPHAGOGASTRODUODENOSCOPY (EGD) WITH PROPOFOL N/A 05/09/2022   Procedure: ESOPHAGOGASTRODUODENOSCOPY (EGD) WITH PROPOFOL;  Surgeon: Napoleon Form, MD;  Location: [facility];  Service: Gastroenterology;  Laterality: N/A;   ORIF FEMUR FRACTURE Left 04/21/2018   Procedure:  OPEN REDUCTION INTERNAL FIXATION (ORIF) PERI PROSTHETIC FEMUR FRACTURE;  Surgeon: Tarry Kos, MD;  Location: [facility];  Service: Orthopedics;  Laterality: Left;   TONSILLECTOMY     TOTAL HIP ARTHROPLASTY  1999, left hip    2006 right hip   TUBAL LIGATION     Social History   Socioeconomic History   Marital status: Married    Spouse name: Not on file   Number of children: 2   Years of education: Not on file   Highest education level: Not on file  Occupational History   Occupation: retired  Tobacco Use   Smoking status: Former    Current packs/day: 0.00    Average packs/day: 1 pack/day for 27.0 years (27.0 ttl pk-yrs)    Types: Cigarettes    Start date: 06/05/1951    Quit date: 06/04/1978    Years since quitting: 44.6   Smokeless tobacco: Never  Vaping Use   Vaping status: Never Used  Substance and Sexual Activity   Alcohol use: Yes    Alcohol/week: 7.0 standard drinks of alcohol    Types: 7 Glasses of wine per week    Comment: 1-2 glasses wine after dinner, but not necessarily each day   Drug use: No   Sexual activity: Not on file  Other Topics Concern   Not on file  Social History Narrative   Not on file  Social Determinants of Health   Financial Resource Strain: Low Risk  (10/23/2021)   Overall Financial Resource Strain (CARDIA)    Difficulty of Paying Living Expenses: Not hard at all  Food Insecurity: No Food Insecurity (05/14/2022)   Hunger Vital Sign    Worried About Running Out of Food in the Last Year: Never true    Ran Out of Food in the Last Year: Never true  Transportation Needs: No Transportation Needs (05/14/2022)   PRAPARE - Administrator, Civil Service (Medical): No    Lack of Transportation (Non-Medical): No  Physical Activity: Insufficiently Active (10/23/2021)   Exercise Vital Sign    Days of Exercise per Week: 5 days    Minutes of Exercise per Session: 20 min  Stress: No Stress Concern Present (10/23/2021)   [employer] of  Occupational Health - Occupational Stress Questionnaire    Feeling of Stress : Not at all  Social Connections: Moderately Integrated (10/23/2021)   Social Connection and Isolation Panel [NHANES]    Frequency of Communication with Friends and Family: More than three times a week    Frequency of Social Gatherings with Friends and Family: More than three times a week    Attends Religious Services: More than 4 times per year    Active Member of [employer] or Organizations: Yes    Attends Banker Meetings: More than 4 times per year    Marital Status: Widowed   Family History  Problem Relation Age of Onset   Pancreatic cancer Mother 40       d. 74   Cancer Father 31       hodgkin's lymphoma and leukemia; d. 48   Colon cancer Brother        dx. late 50s; smoker   Pancreatic cancer Brother 23       d. 34; smoker   Lung cancer Brother 36       smoker   Heart attack Brother        d. late 71s   Colon cancer Paternal Aunt    Breast cancer Daughter 44       negative genetic testing in 2016   Skin cancer Daughter        non-melanoma type; +sun exposure   Kidney failure Son 67       +EtOH abuse resulting in liver, kidney, and pancreas failure   Diabetes Maternal Uncle        d. later age   Heart disease Neg Hx        family   Esophageal cancer Neg Hx    Rectal cancer Neg Hx    Stomach cancer Neg Hx    Allergies  Allergen Reactions   Hydrocodone-Acetaminophen Other (See Comments)    UNK reaction   Tramadol     Loss of control   Current Outpatient Medications  Medication Sig Dispense Refill   acetaminophen (TYLENOL) 500 MG tablet Take 500 mg by mouth daily.     albuterol (VENTOLIN HFA) 108 (90 Base) MCG/ACT inhaler Inhale 2 puffs into the lungs every 6 (six) hours as needed for wheezing or shortness of breath. Please call [phone] to schedule follow up appointment. 18 g 3   Ascorbic Acid (VITAMIN C) 1000 MG tablet Take 1,000 mg by mouth daily.     BIOTIN 5000 PO Take  5,000 mcg by mouth daily.     cholecalciferol (VITAMIN D3) 25 MCG (1000 UNIT) tablet Take 1,000 Units by mouth daily.  hydrochlorothiazide (MICROZIDE) 12.5 MG capsule Take 1 capsule (12.5 mg total) by mouth daily. Please call [phone] to schedule follow up appointment. 90 capsule 3   metoprolol tartrate (LOPRESSOR) 25 MG tablet TAKE 1 TABLET BY MOUTH DAILY. (Patient taking differently: Take 25 mg by mouth daily.) 90 tablet 4   Multiple Vitamins-Minerals (CENTRUM SILVER ADULT 50+) TABS Take 1 tablet by mouth daily.     Propylene Glycol (SYSTANE BALANCE) 0.6 % SOLN Place 1 drop into both eyes daily as needed (dry eyes).     sertraline (ZOLOFT) 50 MG tablet Take 1 tablet (50 mg total) by mouth daily. 90 tablet 3   vitamin B-12 (CYANOCOBALAMIN) 1000 MCG tablet Take 1,000 mcg by mouth daily.     warfarin (COUMADIN) 5 MG tablet Take 1 and 1/2 to 2 tablets by mouth daily as directed by the coumadin clinic 160 tablet 1   No current facility-administered medications for this visit.   No results found.  Review of Systems:   A ROS was performed including pertinent positives and negatives as documented in the HPI.  Physical Exam :   Constitutional: NAD and appears stated age Neurological: Alert and oriented Psych: Appropriate affect and cooperative There were no vitals taken for this visit.   Comprehensive Musculoskeletal Exam:    Patient ambulating with use of a walker.  Well-appearing surgical scar over the lateral aspect of the left thigh.  Mild tenderness palpation over the lateral left hip.  No significant discomfort with passive ROM.  No significant medial or lateral joint line tenderness of the knee.  Active ROM from 0 to 90 degrees.  Palpable, firm knot over superolateral to the knee near the IT band and distal VL.  Patient reports mild tenderness palpation over this area.  Imaging:   Xray (left femur 4 views): No evidence of fracture or hardware changes compared to previous x-ray.   Knee osteoarthritis with joint space narrowing.    I personally reviewed and interpreted the radiographs.   Assessment:   89 y.o. female with atraumatic left knee and hip pain.  X-rays today are reassuring without evidence of fracture or hardware loosening/damage.  She does have evidence of left knee osteoarthritis and received a knee injection over 3 years ago with good relief.  Although I do not believe it is the sole contributor of her knee pain, I think this would be reasonable to repeat today in hopes it gets her some relief.  This was performed in clinic under ultrasound guidance today after patient consent.  There does appear to be an element of IT band irritation.  Her lateral hip pain may be a result of trochanteric bursitis or the IT band as well, however given her extensive surgical history I would like her to be evaluated further by Dr. Roda Shutters as she has not had a follow-up since 2021.  Plan :    - Left knee cortisone injection performed today - Follow up with Dr Roda Shutters for further evaluation    Procedure Note  Patient: Teresa Frey             Date of Birth: 12-12-1929           MRN: [id number]             Visit Date: 01/21/2023  Procedures: Visit Diagnoses:  1. Pain of left hip joint   2. Primary osteoarthritis of left knee     Large Joint Inj: L knee on 01/21/2023 3:03 PM Indications: pain Details: 22 G  1.5 in needle, ultrasound-guided anteromedial approach Medications: 4 mL lidocaine 1 %; 2 mL triamcinolone acetonide 40 MG/ML Outcome: tolerated well, no immediate complications Consent was given by the patient. Immediately prior to procedure a time out was called to verify the correct patient, procedure, equipment, support staff and site/side marked as required. Patient was prepped and draped in the usual sterile fashion.      I personally saw and evaluated the patient, and participated in the management and treatment plan.  Hazle Nordmann, PA-C Orthopedics  This  document was dictated using Conservation officer, historic buildings. A reasonable attempt at proof reading has been made to minimize errors.

## 2023-01-22 NOTE — Addendum Note (Signed)
Addended by: Lennox Solders on: 01/22/2023 04:25 PM   Modules accepted: Orders

## 2023-02-01 ENCOUNTER — Ambulatory Visit: Payer: Medicare Other | Attending: Cardiology | Admitting: *Deleted

## 2023-02-01 DIAGNOSIS — I480 Paroxysmal atrial fibrillation: Secondary | ICD-10-CM | POA: Diagnosis not present

## 2023-02-01 DIAGNOSIS — Z7901 Long term (current) use of anticoagulants: Secondary | ICD-10-CM

## 2023-02-01 DIAGNOSIS — I4891 Unspecified atrial fibrillation: Secondary | ICD-10-CM

## 2023-02-01 LAB — POCT INR: INR: 3.4 — AB (ref 2.0–3.0)

## 2023-02-01 NOTE — Patient Instructions (Addendum)
Description   Do not take any warfarin tomorrow then START taking Warfarin 1.5 tablets daily except 1 tablet of Mondays. Repeat INR in 3 weeks.  [facility]oumadin Clinic [phone]

## 2023-02-08 ENCOUNTER — Ambulatory Visit: Payer: Medicare Other | Admitting: Orthopaedic Surgery

## 2023-02-08 ENCOUNTER — Encounter: Payer: Self-pay | Admitting: Orthopaedic Surgery

## 2023-02-08 DIAGNOSIS — M25552 Pain in left hip: Secondary | ICD-10-CM

## 2023-02-08 MED ORDER — LIDOCAINE HCL 1 % IJ SOLN
3.0000 mL | INTRAMUSCULAR | Status: AC | PRN
Start: 2023-02-08 — End: 2023-02-08
  Administered 2023-02-08: 3 mL

## 2023-02-08 MED ORDER — METHYLPREDNISOLONE ACETATE 40 MG/ML IJ SUSP
40.0000 mg | INTRAMUSCULAR | Status: AC | PRN
Start: 2023-02-08 — End: 2023-02-08
  Administered 2023-02-08: 40 mg via INTRA_ARTICULAR

## 2023-02-08 MED ORDER — BUPIVACAINE HCL 0.5 % IJ SOLN
3.0000 mL | INTRAMUSCULAR | Status: AC | PRN
Start: 2023-02-08 — End: 2023-02-08
  Administered 2023-02-08: 3 mL via INTRA_ARTICULAR

## 2023-02-08 NOTE — Progress Notes (Signed)
Office Visit Note   Patient: Teresa Frey           Date of Birth: 10/22/1929           MRN: [id number] Visit Date: 02/08/2023              Requested by: Amador Cunas, PA-C [address] [place],  [place] [address] PCP: [place], [name], MD   Assessment & Plan: Visit Diagnoses:  1. Pain of left hip joint     Plan: Teresa Frey is a 89 year old female with left hip trochanteric pain.  Potential etiologies for the pain discussed with the patient and treatment options were discussed.  We will try cortisone injection today and I provided her with home exercise program.  Follow-up in 6 weeks if symptoms persist.  Follow-Up Instructions: No follow-ups on file.   Orders:  Orders Placed This Encounter  Procedures   Large Joint Inj: L greater trochanter   No orders of the defined types were placed in this encounter.     Procedures: Large Joint Inj: L greater trochanter on 02/08/2023 11:03 AM Indications: pain Details: 22 G needle Medications: 3 mL lidocaine 1 %; 3 mL bupivacaine 0.5 %; 40 mg methylPREDNISolone acetate 40 MG/ML Outcome: tolerated well, no immediate complications Patient was prepped and draped in the usual sterile fashion.      Clinical Data: No additional findings.   Subjective: Chief Complaint  Patient presents with   Left Hip - Pain    HPI Teresa Frey is a 89 year old female who is well-known to me for periprosthetic femur fracture that I repaired in 2019.  She is recently started having a lateral left hip pain and it feels like it wants to give out when she walks.  Denies any injuries.  She saw Jean Rosenthal at [facility] a few weeks ago and he gave her a knee injection for buckling of the knee joint.  She states that the knee feels better but her hip continues to hurt.  Because of the previous surgeries Jean Rosenthal wanted her to see me for further evaluation and treatment. Review of Systems  Constitutional: Negative.   HENT: Negative.    Eyes: Negative.    Respiratory: Negative.    Cardiovascular: Negative.   Endocrine: Negative.   Musculoskeletal: Negative.   Neurological: Negative.   Hematological: Negative.   Psychiatric/Behavioral: Negative.    All other systems reviewed and are negative.    Objective: Vital Signs: There were no vitals taken for this visit.  Physical Exam Vitals and nursing note reviewed.  Constitutional:      Appearance: She is well-developed.  HENT:     Head: Normocephalic and atraumatic.  Pulmonary:     Effort: Pulmonary effort is normal.  Abdominal:     Palpations: Abdomen is soft.  Musculoskeletal:     Cervical back: Neck supple.  Skin:    General: Skin is warm.     Capillary Refill: Capillary refill takes less than 2 seconds.  Neurological:     Mental Status: She is alert and oriented to person, place, and time.  Psychiatric:        Behavior: Behavior normal.        Thought Content: Thought content normal.        Judgment: Judgment normal.     Ortho Exam Examination of the left hip shows fully healed surgical scar.  She has fluid range of motion of the hip without pain.  She has tenderness directly to the trochanteric region.  Specialty Comments:  No specialty comments available.  Imaging: No results found.   PMFS History: Patient Active Problem List   Diagnosis Date Noted   Acute blood loss anemia 05/09/2022   Gastroesophageal reflux disease with esophagitis and hemorrhage 05/09/2022   GI bleed 05/07/2022   Leukocytosis 08/13/2021   Physical debility 08/12/2021   COPD with acute exacerbation (HCC) 08/11/2021   DNR (do not resuscitate) 08/11/2021   Atrial fibrillation with RVR (HCC) 09/28/2020   Multiple falls 11/13/2019   COPD (chronic obstructive pulmonary disease) (HCC) 09/21/2019   Educated about COVID-19 virus infection 09/09/2019   Spondylosis without myelopathy or radiculopathy, lumbar region 02/12/2019   Spondylolisthesis of lumbar region 02/12/2019   Pain of left hip  joint 12/23/2018   Primary osteoarthritis of left knee 12/23/2018   Periprosthetic fracture around internal prosthetic left hip joint (HCC) 04/19/2018   Long term (current) use of anticoagulants [Z79.01] 08/14/2017   Genetic testing 04/01/2016   possible sleep apnea by history 02/12/2016   A-fib (HCC) 02/11/2016   Surgical wound infection 02/11/2016   Breast cancer of upper-outer quadrant of right female breast (HCC) 01/11/2016   Impaired glucose tolerance 07/28/2014   Anxiety disorder, unspecified 04/07/2007   Osteoarthritis 04/07/2007   Essential hypertension 04/02/2007   History of colonic polyps 04/02/2007   Diverticulosis of intestine with bleeding 04/02/2007   Past Medical History:  Diagnosis Date   ANXIETY 04/07/2007   Breast cancer of upper-outer quadrant of right female breast (HCC) 01/11/2016   COLONIC POLYPS, HX OF 04/02/2007   adenomatous   DIVERTICULITIS, HX OF 04/02/2007   Diverticulosis    DJD (degenerative joint disease)    Hemorrhoids    HYPERTENSION 04/02/2007   OSTEOARTHRITIS, SEVERE 04/07/2007   Paget's disease of bone    lumbar and sacrum   WEIGHT GAIN 11/13/2007    Family History  Problem Relation Age of Onset   Pancreatic cancer Mother 12       d. 36   Cancer Father 31       hodgkin's lymphoma and leukemia; d. 31   Colon cancer Brother        dx. late 56s; smoker   Pancreatic cancer Brother 56       d. 62; smoker   Lung cancer Brother 29       smoker   Heart attack Brother        d. late 72s   Colon cancer Paternal Aunt    Breast cancer Daughter 77       negative genetic testing in 2016   Skin cancer Daughter        non-melanoma type; +sun exposure   Kidney failure Son 23       +EtOH abuse resulting in liver, kidney, and pancreas failure   Diabetes Maternal Uncle        d. later age   Heart disease Neg Hx        family   Esophageal cancer Neg Hx    Rectal cancer Neg Hx    Stomach cancer Neg Hx     Past Surgical History:  Procedure  Laterality Date   APPENDECTOMY     BREAST LUMPECTOMY WITH RADIOACTIVE SEED LOCALIZATION Right 02/08/2016   Procedure: RIGHT BREAST LUMPECTOMY WITH RADIOACTIVE SEED LOCALIZATION;  Surgeon: Claud Kelp, MD;  Location: [facility];  Service: General;  Laterality: Right;   ESOPHAGOGASTRODUODENOSCOPY (EGD) WITH PROPOFOL N/A 05/09/2022   Procedure: ESOPHAGOGASTRODUODENOSCOPY (EGD) WITH PROPOFOL;  Surgeon: Napoleon Form, MD;  [facility];  Service: Gastroenterology;  Laterality: N/A;   ORIF FEMUR FRACTURE Left 04/21/2018   Procedure: OPEN REDUCTION INTERNAL FIXATION (ORIF) PERI PROSTHETIC FEMUR FRACTURE;  Surgeon: Tarry Kos, MD;  Location: [facility];  Service: Orthopedics;  Laterality: Left;   TONSILLECTOMY     TOTAL HIP ARTHROPLASTY  1999, left hip    2006 right hip   TUBAL LIGATION     Social History   Occupational History   Occupation: retired  Tobacco Use   Smoking status: Former    Current packs/day: 0.00    Average packs/day: 1 pack/day for 27.0 years (27.0 ttl pk-yrs)    Types: Cigarettes    Start date: 06/05/1951    Quit date: 06/04/1978    Years since quitting: 44.7   Smokeless tobacco: Never  Vaping Use   Vaping status: Never Used  Substance and Sexual Activity   Alcohol use: Yes    Alcohol/week: 7.0 standard drinks of alcohol    Types: 7 Glasses of wine per week    Comment: 1-2 glasses wine after dinner, but not necessarily each day   Drug use: No   Sexual activity: Not on file

## 2023-02-22 ENCOUNTER — Ambulatory Visit: Payer: Medicare Other | Attending: Cardiovascular Disease

## 2023-02-22 DIAGNOSIS — Z7901 Long term (current) use of anticoagulants: Secondary | ICD-10-CM | POA: Diagnosis not present

## 2023-02-22 DIAGNOSIS — I4891 Unspecified atrial fibrillation: Secondary | ICD-10-CM

## 2023-02-22 LAB — POCT INR: INR: 3 (ref 2.0–3.0)

## 2023-02-22 NOTE — Patient Instructions (Signed)
Continue taking Warfarin 1.5 tablets daily except 1 tablet of Mondays. Repeat INR in 5 weeks.  [facility] [phone]

## 2023-03-11 ENCOUNTER — Other Ambulatory Visit: Payer: Self-pay | Admitting: Cardiology

## 2023-03-11 DIAGNOSIS — I4891 Unspecified atrial fibrillation: Secondary | ICD-10-CM

## 2023-03-12 DIAGNOSIS — Z08 Encounter for follow-up examination after completed treatment for malignant neoplasm: Secondary | ICD-10-CM | POA: Diagnosis not present

## 2023-03-12 DIAGNOSIS — L821 Other seborrheic keratosis: Secondary | ICD-10-CM | POA: Diagnosis not present

## 2023-03-12 DIAGNOSIS — Z86007 Personal history of in-situ neoplasm of skin: Secondary | ICD-10-CM | POA: Diagnosis not present

## 2023-03-12 DIAGNOSIS — L814 Other melanin hyperpigmentation: Secondary | ICD-10-CM | POA: Diagnosis not present

## 2023-03-12 DIAGNOSIS — D225 Melanocytic nevi of trunk: Secondary | ICD-10-CM | POA: Diagnosis not present

## 2023-03-12 DIAGNOSIS — Z85828 Personal history of other malignant neoplasm of skin: Secondary | ICD-10-CM | POA: Diagnosis not present

## 2023-03-29 ENCOUNTER — Ambulatory Visit: Payer: Medicare Other | Attending: Cardiology

## 2023-03-29 DIAGNOSIS — I4891 Unspecified atrial fibrillation: Secondary | ICD-10-CM | POA: Diagnosis not present

## 2023-03-29 DIAGNOSIS — Z7901 Long term (current) use of anticoagulants: Secondary | ICD-10-CM | POA: Diagnosis not present

## 2023-03-29 LAB — POCT INR: INR: 1.5 — AB (ref 2.0–3.0)

## 2023-03-29 NOTE — Patient Instructions (Signed)
TAKE ANOTHER TABLET TODAY ONLY THEN Continue taking Warfarin 1.5 tablets daily except 1 tablet of Mondays. Repeat INR in 3 weeks.  [facility] [phone]

## 2023-04-19 ENCOUNTER — Ambulatory Visit: Payer: Medicare Other | Attending: Internal Medicine

## 2023-04-19 DIAGNOSIS — Z7901 Long term (current) use of anticoagulants: Secondary | ICD-10-CM | POA: Diagnosis not present

## 2023-04-19 DIAGNOSIS — I4891 Unspecified atrial fibrillation: Secondary | ICD-10-CM | POA: Diagnosis not present

## 2023-04-19 LAB — POCT INR: INR: 2.6 (ref 2.0–3.0)

## 2023-04-19 NOTE — Patient Instructions (Addendum)
Description   Continue taking Warfarin 1.5 tablets daily except 1 tablet of Mondays.  Repeat INR in 3 weeks.  [facility] [phone]

## 2023-05-04 IMAGING — DX DG CHEST 1V PORT
1 series · 1 of 1 positions shown · non-contrast
Comparison: 06/17/2018

CLINICAL DATA: Shortness of breath, cough for 2 weeks

EXAM:
PORTABLE CHEST 1 VIEW

[chest ap]
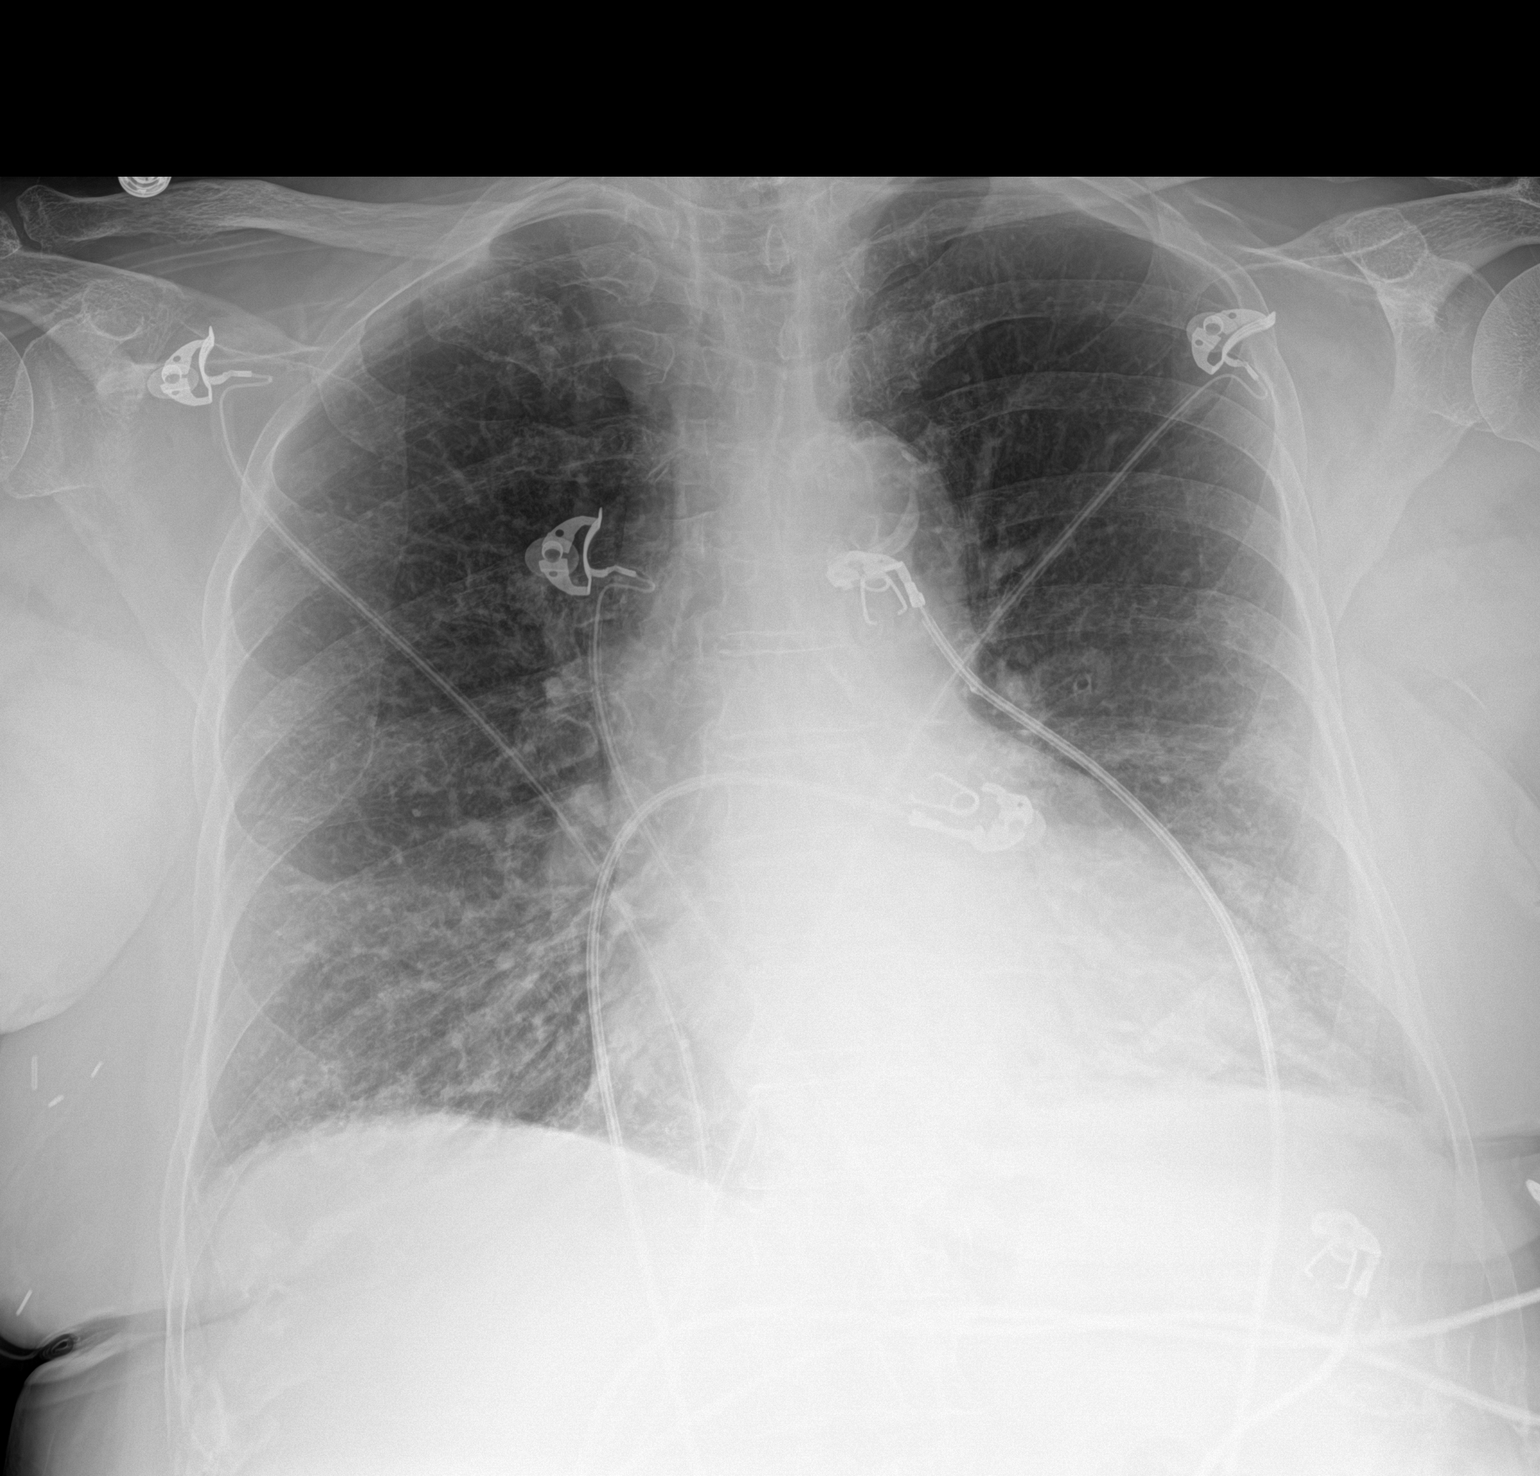

[1 of 1 positions shown; findings below may reference images not displayed]

FINDINGS: Bilateral mild interstitial thickening. Left basilar atelectasis. No
pleural effusion or pneumothorax. Heart and mediastinal contours are
unremarkable.

No acute osseous abnormality.
IMPRESSION: 1. Bilateral interstitial thickening which may reflect mild
interstitial edema versus infection.

## 2023-05-06 DIAGNOSIS — R011 Cardiac murmur, unspecified: Secondary | ICD-10-CM | POA: Insufficient documentation

## 2023-05-06 NOTE — Progress Notes (Unsigned)
  Cardiology Office Note:   Date:  05/09/2023  ID:  Teresa Frey, DOB [birth year], MRN [id number] PCP: [place], [name], MD  [facility] Providers Cardiologist:  Rollene Rotunda, MD {  History of Present Illness:   Teresa Frey is a 89 y.o. female who presents for follow up of atrial fib.  Since I saw her she was in the hospital late last year with GI bleeding.  I did review these records for this visit and she had an esophageal ulcer and had to have her Coumadin reversed.  This was cauterized.  She had a hiatal hernia.  She was treated with Protonix for 3 months.  Her Coumadin was resumed and she has done well since then.  She said she has had some follow-up blood work but I do not have access to this.  She has not had any overt GI bleeding.  She does not feel her atrial fibrillation.  She is in sinus today and has not clear that she would feel her paroxysms.  She has not had any new shortness of breath, PND or orthopnea.  She denies any chest pressure, neck or arm discomfort.  She walks with a cane in the house with her walker when she gets out.  She is careful not to fall.  ROS: As stated in the HPI and negative for all other systems.   Studies Reviewed:    EKG:   EKG Interpretation Date/Time:  Thursday May 09 2023 11:22:06 EST Ventricular Rate:  70 PR Interval:  182 QRS Duration:  96 QT Interval:  404 QTC Calculation: 436 R Axis:   -21  Text Interpretation: Normal sinus rhythm When compared with ECG of 11-Aug-2021 17:23, No significant change since last tracing Confirmed by Rollene Rotunda ([id number]) on 05/09/2023 11:41:40 AM    Risk Assessment/Calculations:    CHA2DS2-VASc Score = 3   This indicates a 3.2% annual risk of stroke. The patient's score is based upon: CHF History: 0 HTN History: 0 Diabetes History: 0 Stroke History: 0 Vascular Disease History: 0 Age Score: 2 Gender Score: 1   Physical Exam:   VS:  BP (!) 146/74   Pulse 64   Ht 5\' 2"  (1.575 m)   Wt  162 lb (73.5 kg)   SpO2 92%   BMI 29.63 kg/m    Wt Readings from Last 3 Encounters:  05/09/23 162 lb (73.5 kg)  10/22/22 161 lb (73 kg)  05/08/22 158 lb 12.8 oz (72 kg)     GEN: Well nourished, well developed in no acute distress NECK: No JVD; No carotid bruits CARDIAC: RRR, soft right upper sternal border systolic murmur, no diastolic murmurs, rubs, gallops RESPIRATORY:  Clear to auscultation without rales, wheezing or rhonchi  ABDOMEN: Soft, non-tender, non-distended EXTREMITIES:  No edema; No deformity   ASSESSMENT AND PLAN:   Atrial Fib:  Teresa Frey has a CHA2DS2 - VASc score of 4.  The patient has had no symptomatic paroxysms.  She tolerates anticoagulation.  I will check a CBC today.  Otherwise no change in therapy.   Murmur: This will be managed conservatively.  She has some very mild aortic stenosis in 2019.  HTN: The blood pressure is mildly elevated today but this is unusual.  No change in therapy.  Her blood pressure is usually quite at target.     Follow up with me in 1 year  Signed, Rollene Rotunda, MD

## 2023-05-08 ENCOUNTER — Other Ambulatory Visit: Payer: Self-pay | Admitting: Cardiology

## 2023-05-09 ENCOUNTER — Ambulatory Visit: Payer: Medicare Other | Attending: Cardiology | Admitting: Cardiology

## 2023-05-09 ENCOUNTER — Ambulatory Visit: Payer: Medicare Other | Admitting: *Deleted

## 2023-05-09 ENCOUNTER — Encounter: Payer: Self-pay | Admitting: Cardiology

## 2023-05-09 VITALS — BP 146/74 | HR 64 | Ht 62.0 in | Wt 162.0 lb

## 2023-05-09 DIAGNOSIS — Z7901 Long term (current) use of anticoagulants: Secondary | ICD-10-CM | POA: Diagnosis not present

## 2023-05-09 DIAGNOSIS — R011 Cardiac murmur, unspecified: Secondary | ICD-10-CM

## 2023-05-09 DIAGNOSIS — I480 Paroxysmal atrial fibrillation: Secondary | ICD-10-CM

## 2023-05-09 DIAGNOSIS — I4891 Unspecified atrial fibrillation: Secondary | ICD-10-CM | POA: Diagnosis not present

## 2023-05-09 LAB — POCT INR: INR: 3.5 — AB (ref 2.0–3.0)

## 2023-05-09 NOTE — Patient Instructions (Signed)
Description   Do not take any warfarin tomorrow then continue taking Warfarin 1.5 tablets daily except 1 tablet of Mondays.  Repeat INR in 3 weeks.  [facility] [phone]

## 2023-05-09 NOTE — Patient Instructions (Signed)
Medication Instructions:  No changes.  *If you need a refill on your cardiac medications before your next appointment, please call your pharmacy*  Lab Work: CBC today. If you have labs (blood work) drawn today and your tests are completely normal, you will receive your results only by: MyChart Message (if you have MyChart) OR A paper copy in the mail If you have any lab test that is abnormal or we need to change your treatment, we will call you to review the results.   Follow-Up: At [facility], you and your health needs are our priority.  As part of our continuing mission to provide you with exceptional heart care, we have created designated Provider Care Teams.  These Care Teams include your primary Cardiologist (physician) and Advanced Practice Providers (APPs -  Physician Assistants and Nurse Practitioners) who all work together to provide you with the care you need, when you need it.  Your next appointment:   12 month(s)  Provider:   Rollene Rotunda, MD

## 2023-05-10 LAB — CBC
Hematocrit: 44.1 % (ref 34.0–46.6)
Hemoglobin: 14.6 g/dL (ref 11.1–15.9)
MCH: 30.5 pg (ref 26.6–33.0)
MCHC: 33.1 g/dL (ref 31.5–35.7)
MCV: 92 fL (ref 79–97)
Platelets: 241 10*3/uL (ref 150–450)
RBC: 4.79 x10E6/uL (ref 3.77–5.28)
RDW: 13.2 % (ref 11.7–15.4)
WBC: 10.2 10*3/uL (ref 3.4–10.8)

## 2023-05-31 ENCOUNTER — Ambulatory Visit: Payer: Medicare Other | Attending: Cardiovascular Disease

## 2023-05-31 DIAGNOSIS — Z7901 Long term (current) use of anticoagulants: Secondary | ICD-10-CM

## 2023-05-31 DIAGNOSIS — I4891 Unspecified atrial fibrillation: Secondary | ICD-10-CM

## 2023-05-31 LAB — POCT INR: INR: 2.3 (ref 2.0–3.0)

## 2023-05-31 NOTE — Patient Instructions (Signed)
Description   Continue taking Warfarin 1.5 tablets daily except 1 tablet of Mondays.  Repeat INR in 4 weeks.  [facility] [phone]

## 2023-06-28 ENCOUNTER — Ambulatory Visit: Payer: Medicare Other | Attending: Cardiology

## 2023-06-28 DIAGNOSIS — I4891 Unspecified atrial fibrillation: Secondary | ICD-10-CM

## 2023-06-28 DIAGNOSIS — Z7901 Long term (current) use of anticoagulants: Secondary | ICD-10-CM

## 2023-06-28 LAB — POCT INR: INR: 2.8 (ref 2.0–3.0)

## 2023-06-28 NOTE — Patient Instructions (Signed)
Description   Continue taking Warfarin 1.5 tablets daily except 1 tablet of Mondays.  Repeat INR in 5 weeks.  [facility] [phone]

## 2023-08-02 ENCOUNTER — Ambulatory Visit: Payer: Medicare Other | Attending: Cardiology | Admitting: *Deleted

## 2023-08-02 DIAGNOSIS — I4891 Unspecified atrial fibrillation: Secondary | ICD-10-CM

## 2023-08-02 DIAGNOSIS — Z7901 Long term (current) use of anticoagulants: Secondary | ICD-10-CM | POA: Diagnosis not present

## 2023-08-02 DIAGNOSIS — I480 Paroxysmal atrial fibrillation: Secondary | ICD-10-CM

## 2023-08-02 LAB — POCT INR: INR: 1.9 — AB (ref 2.0–3.0)

## 2023-08-02 NOTE — Patient Instructions (Addendum)
 Description   Today take an extra 1/2 tablet today then continue taking Warfarin 1.5 tablets daily except 1 tablet of Mondays.  Repeat INR in 5 weeks.  [facility] [phone]

## 2023-09-06 ENCOUNTER — Ambulatory Visit: Payer: Medicare Other | Attending: Cardiology

## 2023-09-06 DIAGNOSIS — Z7901 Long term (current) use of anticoagulants: Secondary | ICD-10-CM

## 2023-09-06 DIAGNOSIS — I4891 Unspecified atrial fibrillation: Secondary | ICD-10-CM

## 2023-09-06 LAB — POCT INR: INR: 2.8 (ref 2.0–3.0)

## 2023-09-06 NOTE — Patient Instructions (Signed)
 continue taking Warfarin 1.5 tablets daily except 1 tablet of Mondays.  Repeat INR in 6 weeks.  [facility] [phone]

## 2023-09-09 DIAGNOSIS — Z85828 Personal history of other malignant neoplasm of skin: Secondary | ICD-10-CM | POA: Diagnosis not present

## 2023-09-09 DIAGNOSIS — L570 Actinic keratosis: Secondary | ICD-10-CM | POA: Diagnosis not present

## 2023-09-09 DIAGNOSIS — Z08 Encounter for follow-up examination after completed treatment for malignant neoplasm: Secondary | ICD-10-CM | POA: Diagnosis not present

## 2023-09-09 DIAGNOSIS — L821 Other seborrheic keratosis: Secondary | ICD-10-CM | POA: Diagnosis not present

## 2023-10-01 ENCOUNTER — Ambulatory Visit: Admitting: Orthopaedic Surgery

## 2023-10-08 ENCOUNTER — Encounter: Payer: Self-pay | Admitting: Orthopaedic Surgery

## 2023-10-08 ENCOUNTER — Ambulatory Visit: Admitting: Orthopaedic Surgery

## 2023-10-08 ENCOUNTER — Other Ambulatory Visit (INDEPENDENT_AMBULATORY_CARE_PROVIDER_SITE_OTHER)

## 2023-10-08 DIAGNOSIS — M1712 Unilateral primary osteoarthritis, left knee: Secondary | ICD-10-CM | POA: Diagnosis not present

## 2023-10-08 DIAGNOSIS — M25552 Pain in left hip: Secondary | ICD-10-CM | POA: Diagnosis not present

## 2023-10-08 DIAGNOSIS — M79675 Pain in left toe(s): Secondary | ICD-10-CM

## 2023-10-08 MED ORDER — BUPIVACAINE HCL 0.5 % IJ SOLN
2.0000 mL | INTRAMUSCULAR | Status: AC | PRN
  Administered 2023-10-08: 2 mL via INTRA_ARTICULAR

## 2023-10-08 MED ORDER — METHYLPREDNISOLONE ACETATE 40 MG/ML IJ SUSP
40.0000 mg | INTRAMUSCULAR | Status: AC | PRN
  Administered 2023-10-08: 40 mg via INTRA_ARTICULAR

## 2023-10-08 MED ORDER — LIDOCAINE HCL 1 % IJ SOLN
3.0000 mL | INTRAMUSCULAR | Status: AC | PRN
  Administered 2023-10-08: 3 mL

## 2023-10-08 MED ORDER — DOXYCYCLINE HYCLATE 100 MG PO TABS: 100.0000 mg | ORAL_TABLET | Freq: Two times a day (BID) | ORAL | 0 refills | Status: DC

## 2023-10-08 MED ORDER — BUPIVACAINE HCL 0.5 % IJ SOLN
3.0000 mL | INTRAMUSCULAR | Status: AC | PRN
  Administered 2023-10-08: 3 mL via INTRA_ARTICULAR

## 2023-10-08 MED ORDER — LIDOCAINE HCL 1 % IJ SOLN
2.0000 mL | INTRAMUSCULAR | Status: AC | PRN
  Administered 2023-10-08: 2 mL

## 2023-10-08 NOTE — Progress Notes (Signed)
 Office Visit Note   Patient: Teresa Frey           Date of Birth: 1929/09/07           MRN: [id number] Visit Date: 10/08/2023              Requested by: [place], [name], MD [address] [place],  [place] [address] PCP: [place], [name], MD   Assessment & Plan: Visit Diagnoses:  1. Great toe pain, left   2. Pain of left hip joint   3. Primary osteoarthritis of left knee     Plan: History of Present Illness Teresa Frey is a 89 year old female who presents with left hip and knee pain, and a recent injury to her left big toe.  She sustained an injury to her left big toe when a soda can impacted it, damaging the toenail, which is expected to fall off. The toe is slightly draining, with swelling and redness exacerbated by her use of blood thinners.  She experiences pain in her left hip and knee, consistent with osteoarthritis. Multiple surgeries may have affected sensation in her hip area. Despite previous interventions, discomfort persists. No recent fever or systemic symptoms.  Physical Exam MUSCULOSKELETAL: Examination of the left great toe shows moderate swelling and rubor of the skin consistent with dependent edema.  No neurovasc compromise.  The nail has become dislodged from the cuticle.  Minimal serous drainage.  Examination of the left knee and left hip are unchanged from prior visit.  RADIOLOGY Left toe X-ray: No fracture (10/08/2023)  Assessment and Plan Trochanteric left hip pain Chronic pain managed with hip injection. - Administer hip injection for pain management.  Left knee pain Chronic pain managed with knee injection. - Administer knee injection for pain management.  Soft tissue damage of toe Soft tissue damage with toenail damage from trauma. Swelling likely due to anticoagulant therapy. No clear infection evidence. Drainage likely from swelling. - Prescribe antibiotic as a precaution against infection. - Advise covering the toe with a bandage to  prevent snagging of the toenail.  Anticoagulant therapy Contributing to increased swelling and redness of the toe.  Follow-Up Instructions: No follow-ups on file.   Orders:  Orders Placed This Encounter  Procedures   Large Joint Inj   Large Joint Inj   XR Toe Great Left   Meds ordered this encounter  Medications   doxycycline (VIBRA-TABS) 100 MG tablet    Sig: Take 1 tablet (100 mg total) by mouth 2 (two) times daily.    Dispense:  20 tablet    Refill:  0      Procedures: Large Joint Inj: L knee on 10/08/2023 12:33 PM Details: 22 G needle Medications: 2 mL bupivacaine  0.5 %; 2 mL lidocaine  1 %; 40 mg methylPREDNISolone  acetate 40 MG/ML Outcome: tolerated well, no immediate complications Patient was prepped and draped in the usual sterile fashion.    Large Joint Inj: L greater trochanter on 10/08/2023 12:33 PM Indications: pain Details: 22 G needle Medications: 3 mL lidocaine  1 %; 3 mL bupivacaine  0.5 %; 40 mg methylPREDNISolone  acetate 40 MG/ML Outcome: tolerated well, no immediate complications Patient was prepped and draped in the usual sterile fashion.       Clinical Data: No additional findings.   Subjective: Chief Complaint  Patient presents with   Left Hip - Pain   Left Knee - Pain   Left Foot - Pain    Great toe    HPI  Review of Systems  Constitutional: Negative.   HENT: Negative.    Eyes: Negative.   Respiratory: Negative.    Cardiovascular: Negative.   Endocrine: Negative.   Musculoskeletal: Negative.   Neurological: Negative.   Hematological: Negative.   Psychiatric/Behavioral: Negative.    All other systems reviewed and are negative.    Objective: Vital Signs: There were no vitals taken for this visit.  Physical Exam Vitals and nursing note reviewed.  Constitutional:      Appearance: She is well-developed.  HENT:     Head: Atraumatic.     Nose: Nose normal.  Eyes:     Extraocular Movements: Extraocular movements intact.   Cardiovascular:     Pulses: Normal pulses.  Pulmonary:     Effort: Pulmonary effort is normal.  Abdominal:     Palpations: Abdomen is soft.  Musculoskeletal:     Cervical back: Neck supple.  Skin:    General: Skin is warm.     Capillary Refill: Capillary refill takes less than 2 seconds.  Neurological:     Mental Status: She is alert. Mental status is at baseline.  Psychiatric:        Behavior: Behavior normal.        Thought Content: Thought content normal.        Judgment: Judgment normal.     Ortho Exam  Specialty Comments:  No specialty comments available.  Imaging: XR Toe Great Left Result Date: 10/08/2023 X-rays of the left great toe show degenerative changes without any acute abnormalities.    PMFS History: Patient Active Problem List   Diagnosis Date Noted   Murmur 05/06/2023   Acute blood loss anemia 05/09/2022   Gastroesophageal reflux disease with esophagitis and hemorrhage 05/09/2022   GI bleed 05/07/2022   Leukocytosis 08/13/2021   Physical debility 08/12/2021   COPD with acute exacerbation (HCC) 08/11/2021   DNR (do not resuscitate) 08/11/2021   Atrial fibrillation with RVR (HCC) 09/28/2020   Multiple falls 11/13/2019   COPD (chronic obstructive pulmonary disease) (HCC) 09/21/2019   Educated about COVID-19 virus infection 09/09/2019   Spondylosis without myelopathy or radiculopathy, lumbar region 02/12/2019   Spondylolisthesis of lumbar region 02/12/2019   Pain of left hip joint 12/23/2018   Primary osteoarthritis of left knee 12/23/2018   Periprosthetic fracture around internal prosthetic left hip joint (HCC) 04/19/2018   Long term (current) use of anticoagulants [Z79.01] 08/14/2017   Genetic testing 04/01/2016   possible sleep apnea by history 02/12/2016   A-fib (HCC) 02/11/2016   Surgical wound infection 02/11/2016   Breast cancer of upper-outer quadrant of right female breast (HCC) 01/11/2016   Impaired glucose tolerance 07/28/2014    Anxiety disorder, unspecified 04/07/2007   Osteoarthritis 04/07/2007   Essential hypertension 04/02/2007   History of colonic polyps 04/02/2007   Diverticulosis of intestine with bleeding 04/02/2007   Past Medical History:  Diagnosis Date   ANXIETY 04/07/2007   Breast cancer of upper-outer quadrant of right female breast (HCC) 01/11/2016   COLONIC POLYPS, HX OF 04/02/2007   adenomatous   DIVERTICULITIS, HX OF 04/02/2007   Diverticulosis    DJD (degenerative joint disease)    Hemorrhoids    HYPERTENSION 04/02/2007   OSTEOARTHRITIS, SEVERE 04/07/2007   Paget's disease of bone    lumbar and sacrum   WEIGHT GAIN 11/13/2007    Family History  Problem Relation Age of Onset   Pancreatic cancer Mother 77       d. 53   Cancer Father 21  hodgkin's lymphoma and leukemia; d. 72   Colon cancer Brother        dx. late 63s; smoker   Pancreatic cancer Brother 57       d. 66; smoker   Lung cancer Brother 36       smoker   Heart attack Brother        d. late 52s   Colon cancer Paternal Aunt    Breast cancer Daughter 32       negative genetic testing in 2016   Skin cancer Daughter        non-melanoma type; +sun exposure   Kidney failure Son 76       +EtOH abuse resulting in liver, kidney, and pancreas failure   Diabetes Maternal Uncle        d. later age   Heart disease Neg Hx        family   Esophageal cancer Neg Hx    Rectal cancer Neg Hx    Stomach cancer Neg Hx     Past Surgical History:  Procedure Laterality Date   APPENDECTOMY     BREAST LUMPECTOMY WITH RADIOACTIVE SEED LOCALIZATION Right 02/08/2016   Procedure: RIGHT BREAST LUMPECTOMY WITH RADIOACTIVE SEED LOCALIZATION;  Surgeon: Boyce Byes, MD;  Location: [facility];  Service: General;  Laterality: Right;   ESOPHAGOGASTRODUODENOSCOPY (EGD) WITH PROPOFOL  N/A 05/09/2022   Procedure: ESOPHAGOGASTRODUODENOSCOPY (EGD) WITH PROPOFOL ;  Surgeon: Sergio Dandy, MD;  Location: [facility];  Service:  Gastroenterology;  Laterality: N/A;   ORIF FEMUR FRACTURE Left 04/21/2018   Procedure: OPEN REDUCTION INTERNAL FIXATION (ORIF) PERI PROSTHETIC FEMUR FRACTURE;  Surgeon: Wes Hamman, MD;  Location: [facility];  Service: Orthopedics;  Laterality: Left;   TONSILLECTOMY     TOTAL HIP ARTHROPLASTY  1999, left hip    2006 right hip   TUBAL LIGATION     Social History   Occupational History   Occupation: retired  Tobacco Use   Smoking status: Former    Current packs/day: 0.00    Average packs/day: 1 pack/day for 27.0 years (27.0 ttl pk-yrs)    Types: Cigarettes    Start date: 06/05/1951    Quit date: 06/04/1978    Years since quitting: 45.3   Smokeless tobacco: Never  Vaping Use   Vaping status: Never Used  Substance and Sexual Activity   Alcohol  use: Yes    Alcohol /week: 7.0 standard drinks of alcohol     Types: 7 Glasses of wine per week    Comment: 1-2 glasses wine after dinner, but not necessarily each day   Drug use: No   Sexual activity: Not on file

## 2023-10-15 ENCOUNTER — Other Ambulatory Visit: Payer: Self-pay | Admitting: Cardiology

## 2023-10-15 ENCOUNTER — Other Ambulatory Visit: Payer: Self-pay | Admitting: Family Medicine

## 2023-10-15 DIAGNOSIS — F419 Anxiety disorder, unspecified: Secondary | ICD-10-CM

## 2023-10-15 DIAGNOSIS — I10 Essential (primary) hypertension: Secondary | ICD-10-CM

## 2023-10-15 DIAGNOSIS — I4891 Unspecified atrial fibrillation: Secondary | ICD-10-CM

## 2023-10-18 ENCOUNTER — Ambulatory Visit: Attending: Cardiology

## 2023-10-18 DIAGNOSIS — I4891 Unspecified atrial fibrillation: Secondary | ICD-10-CM | POA: Diagnosis not present

## 2023-10-18 DIAGNOSIS — Z7901 Long term (current) use of anticoagulants: Secondary | ICD-10-CM | POA: Diagnosis not present

## 2023-10-18 LAB — POCT INR: INR: 1.9 — AB (ref 2.0–3.0)

## 2023-10-18 NOTE — Patient Instructions (Signed)
 Take another 0.5 tablet today only then continue taking Warfarin 1.5 tablets daily except 1 tablet of Mondays.  Repeat INR in 6 weeks.  [facility] [phone]

## 2023-11-19 ENCOUNTER — Other Ambulatory Visit (INDEPENDENT_AMBULATORY_CARE_PROVIDER_SITE_OTHER)

## 2023-11-19 ENCOUNTER — Ambulatory Visit: Admitting: Orthopaedic Surgery

## 2023-11-19 ENCOUNTER — Telehealth: Payer: Self-pay

## 2023-11-19 DIAGNOSIS — M1712 Unilateral primary osteoarthritis, left knee: Secondary | ICD-10-CM | POA: Diagnosis not present

## 2023-11-19 NOTE — Telephone Encounter (Signed)
Please precert for left knee gel injection. Thanks!

## 2023-11-19 NOTE — Telephone Encounter (Signed)
 VOB submitted for Durolane, left knee.

## 2023-11-19 NOTE — Progress Notes (Signed)
 Office Visit Note   Patient: Teresa Frey           Date of Birth: 1930-02-20           MRN: [id number] Visit Date: 11/19/2023              Requested by: [place], [name], MD [address] [place],  [place] [address] PCP: [place], [name], MD   Assessment & Plan: Visit Diagnoses:  1. Primary osteoarthritis of left knee     Plan: History of Present Illness Teresa Frey is a 89 year old female who presents with persistent left knee pain.  She experiences persistent pain in her left knee. A cortisone injection on Oct 08, 2023, did not provide relief, unlike a previous injection in August 2024, which was more effective. She experiences intermittent swelling in her left knee, although no swelling is observed during the examination.  Physical Exam MUSCULOSKELETAL: No swelling in the knee. Good range of motion in the knee.  Results DIAGNOSTIC Left knee injection: No improvement (10/08/2023) Left knee injection: Improvement (01/2023) Hip injection: No pain  Assessment and Plan Left knee osteoarthritis Chronic left knee pain with intermittent swelling. Knee replacement not recommended due to age and existing hardware. Gel injections proposed for temporary pain management. - Obtain approval for gel injection for left knee pain management.  This patient is diagnosed with osteoarthritis of the knee(s).    Radiographs show evidence of joint space narrowing, osteophytes, subchondral sclerosis and/or subchondral cysts.  This patient has knee pain which interferes with functional and activities of daily living.    This patient has experienced inadequate response, adverse effects and/or intolerance with conservative treatments such as acetaminophen , NSAIDS, topical creams, physical therapy or regular exercise, knee bracing and/or weight loss.   This patient has experienced inadequate response or has a contraindication to intra articular steroid injections for at least 3 months.    This patient is not scheduled to have a total knee replacement within 6 months of starting treatment with viscosupplementation.   Follow-Up Instructions: No follow-ups on file.   Orders:  Orders Placed This Encounter  Procedures   XR KNEE 3 VIEW LEFT   No orders of the defined types were placed in this encounter.   Specialty Comments:  No specialty comments available.  Imaging: XR KNEE 3 VIEW LEFT Result Date: 11/19/2023 X-rays of the left knee show a prior lateral locking plate on the femur.  Arthritic and degenerative changes noted within the joint.  Chondrocalcinosis is seen.    PMFS History: Patient Active Problem List   Diagnosis Date Noted   Murmur 05/06/2023   Acute blood loss anemia 05/09/2022   Gastroesophageal reflux disease with esophagitis and hemorrhage 05/09/2022   GI bleed 05/07/2022   Leukocytosis 08/13/2021   Physical debility 08/12/2021   COPD with acute exacerbation (HCC) 08/11/2021   DNR (do not resuscitate) 08/11/2021   Atrial fibrillation with RVR (HCC) 09/28/2020   Multiple falls 11/13/2019   COPD (chronic obstructive pulmonary disease) (HCC) 09/21/2019   Educated about COVID-19 virus infection 09/09/2019   Spondylosis without myelopathy or radiculopathy, lumbar region 02/12/2019   Spondylolisthesis of lumbar region 02/12/2019   Pain of left hip joint 12/23/2018   Primary osteoarthritis of left knee 12/23/2018   Periprosthetic fracture around internal prosthetic left hip joint (HCC) 04/19/2018   Long term (current) use of anticoagulants [Z79.01] 08/14/2017   Genetic testing 04/01/2016   possible sleep apnea by history 02/12/2016   A-fib (HCC)  02/11/2016   Surgical wound infection 02/11/2016   Breast cancer of upper-outer quadrant of right female breast (HCC) 01/11/2016   Impaired glucose tolerance 07/28/2014   Anxiety disorder, unspecified 04/07/2007   Osteoarthritis 04/07/2007   Essential hypertension 04/02/2007   History of colonic  polyps 04/02/2007   Diverticulosis of intestine with bleeding 04/02/2007   Past Medical History:  Diagnosis Date   ANXIETY 04/07/2007   Breast cancer of upper-outer quadrant of right female breast (HCC) 01/11/2016   COLONIC POLYPS, HX OF 04/02/2007   adenomatous   DIVERTICULITIS, HX OF 04/02/2007   Diverticulosis    DJD (degenerative joint disease)    Hemorrhoids    HYPERTENSION 04/02/2007   OSTEOARTHRITIS, SEVERE 04/07/2007   Paget's disease of bone    lumbar and sacrum   WEIGHT GAIN 11/13/2007    Family History  Problem Relation Age of Onset   Pancreatic cancer Mother 48       d. 55   Cancer Father 27       hodgkin's lymphoma and leukemia; d. 77   Colon cancer Brother        dx. late 68s; smoker   Pancreatic cancer Brother 57       d. 59; smoker   Lung cancer Brother 62       smoker   Heart attack Brother        d. late 68s   Colon cancer Paternal Aunt    Breast cancer Daughter 2       negative genetic testing in 2016   Skin cancer Daughter        non-melanoma type; +sun exposure   Kidney failure Son 39       +EtOH abuse resulting in liver, kidney, and pancreas failure   Diabetes Maternal Uncle        d. later age   Heart disease Neg Hx        family   Esophageal cancer Neg Hx    Rectal cancer Neg Hx    Stomach cancer Neg Hx     Past Surgical History:  Procedure Laterality Date   APPENDECTOMY     BREAST LUMPECTOMY WITH RADIOACTIVE SEED LOCALIZATION Right 02/08/2016   Procedure: RIGHT BREAST LUMPECTOMY WITH RADIOACTIVE SEED LOCALIZATION;  Surgeon: Boyce Byes, MD;  Location: [facility];  Service: General;  Laterality: Right;   ESOPHAGOGASTRODUODENOSCOPY (EGD) WITH PROPOFOL  N/A 05/09/2022   Procedure: ESOPHAGOGASTRODUODENOSCOPY (EGD) WITH PROPOFOL ;  Surgeon: Sergio Dandy, MD;  Location: [facility];  Service: Gastroenterology;  Laterality: N/A;   ORIF FEMUR FRACTURE Left 04/21/2018   Procedure: OPEN REDUCTION INTERNAL FIXATION (ORIF) PERI  PROSTHETIC FEMUR FRACTURE;  Surgeon: Wes Hamman, MD;  Location: [facility];  Service: Orthopedics;  Laterality: Left;   TONSILLECTOMY     TOTAL HIP ARTHROPLASTY  1999, left hip    2006 right hip   TUBAL LIGATION     Social History   Occupational History   Occupation: retired  Tobacco Use   Smoking status: Former    Current packs/day: 0.00    Average packs/day: 1 pack/day for 27.0 years (27.0 ttl pk-yrs)    Types: Cigarettes    Start date: 06/05/1951    Quit date: 06/04/1978    Years since quitting: 45.4   Smokeless tobacco: Never  Vaping Use   Vaping status: Never Used  Substance and Sexual Activity   Alcohol  use: Yes    Alcohol /week: 7.0 standard drinks of alcohol     Types: 7 Glasses of wine  per week    Comment: 1-2 glasses wine after dinner, but not necessarily each day   Drug use: No   Sexual activity: Not on file

## 2023-11-29 ENCOUNTER — Ambulatory Visit: Attending: Cardiology

## 2023-11-29 DIAGNOSIS — I4891 Unspecified atrial fibrillation: Secondary | ICD-10-CM

## 2023-11-29 DIAGNOSIS — Z7901 Long term (current) use of anticoagulants: Secondary | ICD-10-CM

## 2023-11-29 LAB — POCT INR: INR: 2 (ref 2.0–3.0)

## 2023-11-29 NOTE — Progress Notes (Signed)
Please see anticoagulation encounter.

## 2023-11-29 NOTE — Patient Instructions (Signed)
 continue taking Warfarin 1.5 tablets daily except 1 tablet of Mondays.  Repeat INR in 6 weeks.  [facility] [phone]

## 2023-12-02 ENCOUNTER — Other Ambulatory Visit: Payer: Self-pay

## 2023-12-02 DIAGNOSIS — M1712 Unilateral primary osteoarthritis, left knee: Secondary | ICD-10-CM

## 2023-12-05 ENCOUNTER — Ambulatory Visit: Admitting: Physician Assistant

## 2023-12-05 DIAGNOSIS — M1712 Unilateral primary osteoarthritis, left knee: Secondary | ICD-10-CM | POA: Diagnosis not present

## 2023-12-05 MED ORDER — SODIUM HYALURONATE 60 MG/3ML IX PRSY
60.0000 mg | PREFILLED_SYRINGE | INTRA_ARTICULAR | Status: AC | PRN
  Administered 2023-12-05: 60 mg via INTRA_ARTICULAR

## 2023-12-05 MED ORDER — LIDOCAINE HCL 1 % IJ SOLN
5.0000 mL | INTRAMUSCULAR | Status: AC | PRN
  Administered 2023-12-05: 5 mL

## 2023-12-05 NOTE — Progress Notes (Signed)
 Office Visit Note   Patient: Teresa Frey           Date of Birth: 1929/12/24           MRN: [id number] Visit Date: 12/05/2023              Requested by: [place], [name], MD [address] [place],  [place] [address] PCP: [place], [name], MD   Assessment & Plan: Visit Diagnoses:  1. Primary osteoarthritis of left knee     Plan: Impression is left knee osteoarthritis.  Today, proceed with left knee Durolane injection.  She tolerated this well.  Follow-up as needed.  Follow-Up Instructions: Return if symptoms worsen or fail to improve.   Orders:  No orders of the defined types were placed in this encounter.  No orders of the defined types were placed in this encounter.     Procedures: Large Joint Inj: L knee on 12/05/2023 9:17 AM Indications: pain Details: 22 G needle, anterolateral approach Medications: 5 mL lidocaine  1 %; 60 mg Sodium Hyaluronate 60 MG/3ML      Clinical Data: No additional findings.   Subjective: Chief Complaint  Patient presents with   Left Knee - Pain    Durolane injection planned    HPI patient is a pleasant 89 year old female who comes in today for left knee Durolane injection.  Previous cortisone injections have only provided temporary relief.  No previous viscosupplementation injection.     Objective: Vital Signs: There were no vitals taken for this visit.    Ortho Exam unchanged left knee exam  Specialty Comments:  No specialty comments available.  Imaging: No new imaging   PMFS History: Patient Active Problem List   Diagnosis Date Noted   Murmur 05/06/2023   Acute blood loss anemia 05/09/2022   Gastroesophageal reflux disease with esophagitis and hemorrhage 05/09/2022   GI bleed 05/07/2022   Leukocytosis 08/13/2021   Physical debility 08/12/2021   COPD with acute exacerbation (HCC) 08/11/2021   DNR (do not resuscitate) 08/11/2021   Atrial fibrillation with RVR (HCC) 09/28/2020   Multiple falls 11/13/2019    COPD (chronic obstructive pulmonary disease) (HCC) 09/21/2019   Educated about COVID-19 virus infection 09/09/2019   Spondylosis without myelopathy or radiculopathy, lumbar region 02/12/2019   Spondylolisthesis of lumbar region 02/12/2019   Pain of left hip joint 12/23/2018   Primary osteoarthritis of left knee 12/23/2018   Periprosthetic fracture around internal prosthetic left hip joint (HCC) 04/19/2018   Long term (current) use of anticoagulants [Z79.01] 08/14/2017   Genetic testing 04/01/2016   possible sleep apnea by history 02/12/2016   A-fib (HCC) 02/11/2016   Surgical wound infection 02/11/2016   Breast cancer of upper-outer quadrant of right female breast (HCC) 01/11/2016   Impaired glucose tolerance 07/28/2014   Anxiety disorder, unspecified 04/07/2007   Osteoarthritis 04/07/2007   Essential hypertension 04/02/2007   History of colonic polyps 04/02/2007   Diverticulosis of intestine with bleeding 04/02/2007   Past Medical History:  Diagnosis Date   ANXIETY 04/07/2007   Breast cancer of upper-outer quadrant of right female breast (HCC) 01/11/2016   COLONIC POLYPS, HX OF 04/02/2007   adenomatous   DIVERTICULITIS, HX OF 04/02/2007   Diverticulosis    DJD (degenerative joint disease)    Hemorrhoids    HYPERTENSION 04/02/2007   OSTEOARTHRITIS, SEVERE 04/07/2007   Paget's disease of bone    lumbar and sacrum   WEIGHT GAIN 11/13/2007    Family History  Problem Relation Age of Onset  Pancreatic cancer Mother 50       d. 11   Cancer Father 59       hodgkin's lymphoma and leukemia; d. 67   Colon cancer Brother        dx. late 2s; smoker   Pancreatic cancer Brother 62       d. 9; smoker   Lung cancer Brother 48       smoker   Heart attack Brother        d. late 20s   Colon cancer Paternal Aunt    Breast cancer Daughter 52       negative genetic testing in 2016   Skin cancer Daughter        non-melanoma type; +sun exposure   Kidney failure Son 77       +EtOH abuse  resulting in liver, kidney, and pancreas failure   Diabetes Maternal Uncle        d. later age   Heart disease Neg Hx        family   Esophageal cancer Neg Hx    Rectal cancer Neg Hx    Stomach cancer Neg Hx     Past Surgical History:  Procedure Laterality Date   APPENDECTOMY     BREAST LUMPECTOMY WITH RADIOACTIVE SEED LOCALIZATION Right 02/08/2016   Procedure: RIGHT BREAST LUMPECTOMY WITH RADIOACTIVE SEED LOCALIZATION;  Surgeon: Elon Pacini, MD;  Location: [facility];  Service: General;  Laterality: Right;   ESOPHAGOGASTRODUODENOSCOPY (EGD) WITH PROPOFOL  N/A 05/09/2022   Procedure: ESOPHAGOGASTRODUODENOSCOPY (EGD) WITH PROPOFOL ;  Surgeon: Shila Gustav GAILS, MD;  Location: [facility];  Service: Gastroenterology;  Laterality: N/A;   ORIF FEMUR FRACTURE Left 04/21/2018   Procedure: OPEN REDUCTION INTERNAL FIXATION (ORIF) PERI PROSTHETIC FEMUR FRACTURE;  Surgeon: Jerri Kay HERO, MD;  Location: [facility];  Service: Orthopedics;  Laterality: Left;   TONSILLECTOMY     TOTAL HIP ARTHROPLASTY  1999, left hip    2006 right hip   TUBAL LIGATION     Social History   Occupational History   Occupation: retired  Tobacco Use   Smoking status: Former    Current packs/day: 0.00    Average packs/day: 1 pack/day for 27.0 years (27.0 ttl pk-yrs)    Types: Cigarettes    Start date: 06/05/1951    Quit date: 06/04/1978    Years since quitting: 45.5   Smokeless tobacco: Never  Vaping Use   Vaping status: Never Used  Substance and Sexual Activity   Alcohol  use: Yes    Alcohol /week: 7.0 standard drinks of alcohol     Types: 7 Glasses of wine per week    Comment: 1-2 glasses wine after dinner, but not necessarily each day   Drug use: No   Sexual activity: Not on file

## 2024-01-09 ENCOUNTER — Ambulatory Visit: Attending: Cardiology | Admitting: *Deleted

## 2024-01-09 DIAGNOSIS — Z5181 Encounter for therapeutic drug level monitoring: Secondary | ICD-10-CM | POA: Diagnosis not present

## 2024-01-09 DIAGNOSIS — Z7901 Long term (current) use of anticoagulants: Secondary | ICD-10-CM | POA: Diagnosis not present

## 2024-01-09 DIAGNOSIS — I4891 Unspecified atrial fibrillation: Secondary | ICD-10-CM

## 2024-01-09 LAB — POCT INR: POC INR: 2.7

## 2024-01-09 NOTE — Progress Notes (Signed)
 INR 2.7,  Please see anticoagulation encounter

## 2024-01-09 NOTE — Patient Instructions (Signed)
 Description    continue taking Warfarin 1.5 tablets daily except 1 tablet of Mondays.  Repeat INR in 6 weeks.  Coumadin  Clinic [phone]

## 2024-02-18 ENCOUNTER — Ambulatory Visit: Admitting: Orthopaedic Surgery

## 2024-02-18 ENCOUNTER — Encounter: Payer: Self-pay | Admitting: Orthopaedic Surgery

## 2024-02-18 DIAGNOSIS — M1712 Unilateral primary osteoarthritis, left knee: Secondary | ICD-10-CM | POA: Diagnosis not present

## 2024-02-18 MED ORDER — BUPIVACAINE HCL 0.5 % IJ SOLN
2.0000 mL | INTRAMUSCULAR | Status: AC | PRN
  Administered 2024-02-18: 2 mL via INTRA_ARTICULAR

## 2024-02-18 MED ORDER — METHYLPREDNISOLONE ACETATE 40 MG/ML IJ SUSP
40.0000 mg | INTRAMUSCULAR | Status: AC | PRN
  Administered 2024-02-18: 40 mg via INTRA_ARTICULAR

## 2024-02-18 MED ORDER — LIDOCAINE HCL 1 % IJ SOLN
2.0000 mL | INTRAMUSCULAR | Status: AC | PRN
  Administered 2024-02-18: 2 mL

## 2024-02-18 NOTE — Progress Notes (Signed)
 Office Visit Note   Patient: Teresa Frey           Date of Birth: 12-30-1929           MRN: [id number] Visit Date: 02/18/2024              Requested by: [place], [name], MD [address] [place],  [place] [address] PCP: [place], [name], MD   Assessment & Plan: Visit Diagnoses:  1. Primary osteoarthritis of left knee     Plan: History of Present Illness Von E Tetro is a 89 year old female with a history of diabetes and high blood pressure who presents with persistent left knee pain.  She experiences persistent left knee pain unrelieved by previous treatments, including a Durolane injection in July and multiple cortisone injections. The most recent cortisone injection also failed to alleviate her symptoms. The pain is tolerable when sitting but problematic when standing or walking, with a sensation of the knee giving way. She has not been engaging in physical therapy or strengthening exercises recently. She takes Tylenol  every morning along with her other medications.  Left knee exam unchanged from prior visit.  Assessment and Plan Left knee osteoarthritis Chronic osteoarthritis with inadequate response to previous injections. Pain and instability persist, exacerbated by lack of physical therapy. - Administer cortisone injection for pain relief. - Recommend daily physical therapy or strengthening exercises to enhance quadriceps strength.  Follow-Up Instructions: No follow-ups on file.   Orders:  No orders of the defined types were placed in this encounter.  No orders of the defined types were placed in this encounter.     Procedures: Large Joint Inj: L knee on 02/18/2024 10:34 AM Details: 22 G needle Medications: 2 mL bupivacaine  0.5 %; 2 mL lidocaine  1 %; 40 mg methylPREDNISolone  acetate 40 MG/ML Outcome: tolerated well, no immediate complications Patient was prepped and draped in the usual sterile fashion.       Clinical Data: No additional  findings.   Subjective: Chief Complaint  Patient presents with   Left Knee - Follow-up    HPI  Review of Systems  Constitutional: Negative.   HENT: Negative.    Eyes: Negative.   Respiratory: Negative.    Cardiovascular: Negative.   Endocrine: Negative.   Musculoskeletal: Negative.   Neurological: Negative.   Hematological: Negative.   Psychiatric/Behavioral: Negative.    All other systems reviewed and are negative.    Objective: Vital Signs: There were no vitals taken for this visit.  Physical Exam Vitals and nursing note reviewed.  Constitutional:      Appearance: She is well-developed.  HENT:     Head: Atraumatic.     Nose: Nose normal.  Eyes:     Extraocular Movements: Extraocular movements intact.  Cardiovascular:     Pulses: Normal pulses.  Pulmonary:     Effort: Pulmonary effort is normal.  Abdominal:     Palpations: Abdomen is soft.  Musculoskeletal:     Cervical back: Neck supple.  Skin:    General: Skin is warm.     Capillary Refill: Capillary refill takes less than 2 seconds.  Neurological:     Mental Status: She is alert. Mental status is at baseline.  Psychiatric:        Behavior: Behavior normal.        Thought Content: Thought content normal.        Judgment: Judgment normal.     Ortho Exam  Specialty Comments:  No specialty comments  available.  Imaging: No results found.   PMFS History: Patient Active Problem List   Diagnosis Date Noted   Murmur 05/06/2023   Acute blood loss anemia 05/09/2022   Gastroesophageal reflux disease with esophagitis and hemorrhage 05/09/2022   GI bleed 05/07/2022   Leukocytosis 08/13/2021   Physical debility 08/12/2021   COPD with acute exacerbation (HCC) 08/11/2021   DNR (do not resuscitate) 08/11/2021   Atrial fibrillation with RVR (HCC) 09/28/2020   Multiple falls 11/13/2019   COPD (chronic obstructive pulmonary disease) (HCC) 09/21/2019   Educated about COVID-19 virus infection 09/09/2019    Spondylosis without myelopathy or radiculopathy, lumbar region 02/12/2019   Spondylolisthesis of lumbar region 02/12/2019   Pain of left hip joint 12/23/2018   Primary osteoarthritis of left knee 12/23/2018   Periprosthetic fracture around internal prosthetic left hip joint (HCC) 04/19/2018   Long term (current) use of anticoagulants [Z79.01] 08/14/2017   Genetic testing 04/01/2016   possible sleep apnea by history 02/12/2016   A-fib (HCC) 02/11/2016   Surgical wound infection 02/11/2016   Breast cancer of upper-outer quadrant of right female breast (HCC) 01/11/2016   Impaired glucose tolerance 07/28/2014   Anxiety disorder, unspecified 04/07/2007   Osteoarthritis 04/07/2007   Essential hypertension 04/02/2007   History of colonic polyps 04/02/2007   Diverticulosis of intestine with bleeding 04/02/2007   Past Medical History:  Diagnosis Date   ANXIETY 04/07/2007   Breast cancer of upper-outer quadrant of right female breast (HCC) 01/11/2016   COLONIC POLYPS, HX OF 04/02/2007   adenomatous   DIVERTICULITIS, HX OF 04/02/2007   Diverticulosis    DJD (degenerative joint disease)    Hemorrhoids    HYPERTENSION 04/02/2007   OSTEOARTHRITIS, SEVERE 04/07/2007   Paget's disease of bone    lumbar and sacrum   WEIGHT GAIN 11/13/2007    Family History  Problem Relation Age of Onset   Pancreatic cancer Mother 52       d. 26   Cancer Father 41       hodgkin's lymphoma and leukemia; d. 61   Colon cancer Brother        dx. late 19s; smoker   Pancreatic cancer Brother 57       d. 58; smoker   Lung cancer Brother 3       smoker   Heart attack Brother        d. late 34s   Colon cancer Paternal Aunt    Breast cancer Daughter 78       negative genetic testing in 2016   Skin cancer Daughter        non-melanoma type; +sun exposure   Kidney failure Son 90       +EtOH abuse resulting in liver, kidney, and pancreas failure   Diabetes Maternal Uncle        d. later age   Heart disease  Neg Hx        family   Esophageal cancer Neg Hx    Rectal cancer Neg Hx    Stomach cancer Neg Hx     Past Surgical History:  Procedure Laterality Date   APPENDECTOMY     BREAST LUMPECTOMY WITH RADIOACTIVE SEED LOCALIZATION Right 02/08/2016   Procedure: RIGHT BREAST LUMPECTOMY WITH RADIOACTIVE SEED LOCALIZATION;  Surgeon: Elon Pacini, MD;  Location: [facility];  Service: General;  Laterality: Right;   ESOPHAGOGASTRODUODENOSCOPY (EGD) WITH PROPOFOL  N/A 05/09/2022   Procedure: ESOPHAGOGASTRODUODENOSCOPY (EGD) WITH PROPOFOL ;  Surgeon: Shila Gustav GAILS, MD;  Location: [facility];  Service: Gastroenterology;  Laterality: N/A;   ORIF FEMUR FRACTURE Left 04/21/2018   Procedure: OPEN REDUCTION INTERNAL FIXATION (ORIF) PERI PROSTHETIC FEMUR FRACTURE;  Surgeon: Jerri Kay HERO, MD;  Location: [facility];  Service: Orthopedics;  Laterality: Left;   TONSILLECTOMY     TOTAL HIP ARTHROPLASTY  1999, left hip    2006 right hip   TUBAL LIGATION     Social History   Occupational History   Occupation: retired  Tobacco Use   Smoking status: Former    Current packs/day: 0.00    Average packs/day: 1 pack/day for 27.0 years (27.0 ttl pk-yrs)    Types: Cigarettes    Start date: 06/05/1951    Quit date: 06/04/1978    Years since quitting: 45.7   Smokeless tobacco: Never  Vaping Use   Vaping status: Never Used  Substance and Sexual Activity   Alcohol  use: Yes    Alcohol /week: 7.0 standard drinks of alcohol     Types: 7 Glasses of wine per week    Comment: 1-2 glasses wine after dinner, but not necessarily each day   Drug use: No   Sexual activity: Not on file

## 2024-02-20 ENCOUNTER — Ambulatory Visit: Attending: Cardiology

## 2024-02-20 DIAGNOSIS — Z7901 Long term (current) use of anticoagulants: Secondary | ICD-10-CM | POA: Diagnosis not present

## 2024-02-20 DIAGNOSIS — I4891 Unspecified atrial fibrillation: Secondary | ICD-10-CM

## 2024-02-20 LAB — POCT INR: INR: 2.9 (ref 2.0–3.0)

## 2024-02-20 NOTE — Patient Instructions (Signed)
 continue taking Warfarin 1.5 tablets daily except 1 tablet of Mondays.  Repeat INR in 6 weeks.  [facility] [phone]

## 2024-02-20 NOTE — Progress Notes (Signed)
 INR 2.9 Please see anticoagulation encounter continue taking Warfarin 1.5 tablets daily except 1 tablet of Mondays.  Repeat INR in 6 weeks.  Coumadin  Clinic [phone]

## 2024-03-11 DIAGNOSIS — D492 Neoplasm of unspecified behavior of bone, soft tissue, and skin: Secondary | ICD-10-CM | POA: Diagnosis not present

## 2024-03-11 DIAGNOSIS — Z08 Encounter for follow-up examination after completed treatment for malignant neoplasm: Secondary | ICD-10-CM | POA: Diagnosis not present

## 2024-03-11 DIAGNOSIS — D1801 Hemangioma of skin and subcutaneous tissue: Secondary | ICD-10-CM | POA: Diagnosis not present

## 2024-03-11 DIAGNOSIS — L814 Other melanin hyperpigmentation: Secondary | ICD-10-CM | POA: Diagnosis not present

## 2024-03-11 DIAGNOSIS — C44319 Basal cell carcinoma of skin of other parts of face: Secondary | ICD-10-CM | POA: Diagnosis not present

## 2024-03-11 DIAGNOSIS — Z86007 Personal history of in-situ neoplasm of skin: Secondary | ICD-10-CM | POA: Diagnosis not present

## 2024-03-11 DIAGNOSIS — L821 Other seborrheic keratosis: Secondary | ICD-10-CM | POA: Diagnosis not present

## 2024-03-11 DIAGNOSIS — Z85828 Personal history of other malignant neoplasm of skin: Secondary | ICD-10-CM | POA: Diagnosis not present

## 2024-03-11 DIAGNOSIS — L570 Actinic keratosis: Secondary | ICD-10-CM | POA: Diagnosis not present

## 2024-04-02 ENCOUNTER — Ambulatory Visit: Attending: Cardiology | Admitting: *Deleted

## 2024-04-02 DIAGNOSIS — Z7901 Long term (current) use of anticoagulants: Secondary | ICD-10-CM

## 2024-04-02 DIAGNOSIS — I4891 Unspecified atrial fibrillation: Secondary | ICD-10-CM

## 2024-04-02 LAB — POCT INR: POC INR: 1.9

## 2024-04-02 NOTE — Patient Instructions (Signed)
 Description   INR 1.9;  Take 2 tablets of warfarin today and then continue taking Warfarin 1.5 tablets daily except 1 tablet of Mondays.  Repeat INR in 3 weeks.  Coumadin  Clinic [phone]

## 2024-04-02 NOTE — Progress Notes (Signed)
 Lab Results  Component Value Date   INR 1.9 04/02/2024   INR 2.9 02/20/2024   INR 2.7 01/09/2024    Description   INR 1.9;  Take 2 tablets of warfarin today and then continue taking Warfarin 1.5 tablets daily except 1 tablet of Mondays.  Repeat INR in 3 weeks.  Coumadin  Clinic [phone]

## 2024-04-13 ENCOUNTER — Other Ambulatory Visit: Payer: Self-pay | Admitting: Family Medicine

## 2024-04-13 DIAGNOSIS — I10 Essential (primary) hypertension: Secondary | ICD-10-CM

## 2024-04-15 ENCOUNTER — Telehealth: Payer: Self-pay | Admitting: Family Medicine

## 2024-04-15 DIAGNOSIS — I10 Essential (primary) hypertension: Secondary | ICD-10-CM

## 2024-04-15 MED ORDER — HYDROCHLOROTHIAZIDE 12.5 MG PO CAPS
12.5000 mg | ORAL_CAPSULE | Freq: Every day | ORAL | 0 refills | Status: DC
Start: 2024-04-15 — End: 2024-04-17

## 2024-04-15 NOTE — Telephone Encounter (Signed)
 Copied from CRM [id number]. Topic: Clinical - Prescription Issue >> Apr 15, 2024 10:51 AM Suzen RAMAN wrote: Reason for CRM: patent would like to know if her medication hydrochlorothiazide  (MICROZIDE ) 12.5 MG capsule can be bridge til her upcoming appointment on Friday 04/17/24. Patient is completely out of medication.

## 2024-04-15 NOTE — Addendum Note (Signed)
 Addended by: Jaishon Krisher C on: 04/15/2024 02:13 PM   Modules accepted: Orders

## 2024-04-15 NOTE — Telephone Encounter (Signed)
 Pt was advised that appointment on 04/17/24 and medication was refilled for 30 days only.

## 2024-04-17 ENCOUNTER — Encounter: Payer: Self-pay | Admitting: Family Medicine

## 2024-04-17 ENCOUNTER — Ambulatory Visit: Admitting: Family Medicine

## 2024-04-17 VITALS — BP 132/64 | HR 70 | Temp 97.9°F | Resp 16 | Ht 62.0 in | Wt 156.0 lb

## 2024-04-17 DIAGNOSIS — I480 Paroxysmal atrial fibrillation: Secondary | ICD-10-CM

## 2024-04-17 DIAGNOSIS — I10 Essential (primary) hypertension: Secondary | ICD-10-CM | POA: Diagnosis not present

## 2024-04-17 DIAGNOSIS — I4891 Unspecified atrial fibrillation: Secondary | ICD-10-CM

## 2024-04-17 DIAGNOSIS — F419 Anxiety disorder, unspecified: Secondary | ICD-10-CM

## 2024-04-17 DIAGNOSIS — J449 Chronic obstructive pulmonary disease, unspecified: Secondary | ICD-10-CM | POA: Diagnosis not present

## 2024-04-17 LAB — BASIC METABOLIC PANEL WITH GFR
BUN: 22 mg/dL (ref 6–23)
CO2: 33 meq/L — ABNORMAL HIGH (ref 19–32)
Calcium: 10 mg/dL (ref 8.4–10.5)
Chloride: 102 meq/L (ref 96–112)
Creatinine, Ser: 0.83 mg/dL (ref 0.40–1.20)
GFR: 61.11 mL/min (ref >60.00)
Glucose, Bld: 100 mg/dL — ABNORMAL HIGH (ref 70–99)
Potassium: 4.3 meq/L (ref 3.5–5.1)
Sodium: 144 meq/L (ref 135–145)

## 2024-04-17 MED ORDER — ALBUTEROL SULFATE HFA 108 (90 BASE) MCG/ACT IN AERS: 2.0000 | INHALATION_SPRAY | Freq: Four times a day (QID) | RESPIRATORY_TRACT | 3 refills | Status: AC | PRN

## 2024-04-17 MED ORDER — HYDROCHLOROTHIAZIDE 12.5 MG PO CAPS: 12.5000 mg | ORAL_CAPSULE | Freq: Every day | ORAL | 3 refills | Status: AC

## 2024-04-17 MED ORDER — SERTRALINE HCL 50 MG PO TABS: 50.0000 mg | ORAL_TABLET | Freq: Every day | ORAL | 3 refills | Status: AC

## 2024-04-17 NOTE — Progress Notes (Signed)
 Chief Complaint  Patient presents with   Medical Management of Chronic Issues    Pt is here with daughter, Teresa Frey, for medication refill.    Discussed the use of AI scribe software for clinical note transcription with the patient, who gave verbal consent to proceed. History of Present Illness Teresa Frey is a 89 year old female with PMHx significant for atrial fib on chronic anticoagulation, HTN, anxiety, GERD,COPD,and OA who presents for a routine follow-up visit. Last seen on 10/22/22.  Since her last visit she has seen her cardiologist and orthopedist.  -Anxiety:She is on sertraline  50 mg daily, which she finds beneficial and wishes to continue. She has been on this medication for many years.    04/17/2024    9:11 AM 10/25/2022   10:10 AM 10/22/2022   11:28 AM 10/23/2021   11:35 AM 08/22/2021   11:24 AM  Depression screen PHQ 2/9  Decreased Interest 0 0 0 0 0  Down, Depressed, Hopeless 0 0 0 0 0  PHQ - 2 Score 0 0 0 0 0  Altered sleeping  0     Tired, decreased energy  0     Change in appetite  0     Feeling bad or failure about yourself   0     Trouble concentrating  0     Moving slowly or fidgety/restless  0     Suicidal thoughts  0     PHQ-9 Score  0      Difficult doing work/chores  Not difficult at all        Data saved with a previous flowsheet row definition      04/17/2024    9:09 AM 10/25/2022   10:10 AM  GAD 7 : Generalized Anxiety Score  Nervous, Anxious, on Edge 0 0  Control/stop worrying 0 0  Worry too much - different things 0 0  Trouble relaxing 0 0  Restless 0 0  Easily annoyed or irritable 0 0  Afraid - awful might happen 0 0  Total GAD 7 Score 0 0  Anxiety Difficulty Not difficult at all     -COPD: She uses albuterol  inh as needed, approximately twice a week, primarily for wheezing. She does not use any other inhalers and reports that twice a week is the maximum frequency of use.  Her past medical history includes breast cancer, for which she is  no longer under oncology care, and she has decided not to continue with mammograms.   Dx'ed in 01/2016, s/p right breast lumpectomy, completed 5 years of Anastrozole  02/2021. She followed with Dr Lanny last on 10/06/21.   She also had a basal cell carcinoma removed from her right temporal area about two weeks ago, managed by a surgical center.  Her current medications also include vitamin D  1000 units daily and a multivitamin.   Hypertension: She is on HCTZ 12.5 mg daily and Metoprolol  tartrate 25 mg daily. She occasionally checks her blood pressure at home.  Atrial fibrillation: She sees her cardiologist annually and regularly visits the Coumadin  clinic. Negative for unusual or severe headache, visual changes, exertional chest pain, worsening dyspnea,  focal weakness, or edema.  Lab Results  Component Value Date   CREATININE 0.82 10/22/2022   BUN 25 (H) 10/22/2022   NA 142 10/22/2022   K 4.4 10/22/2022   CL 106 10/22/2022   CO2 28 10/22/2022   Review of Systems  Constitutional:  Negative for chills and fever.  HENT:  Negative  for sore throat.   Respiratory:  Negative for cough and wheezing.   Gastrointestinal:  Negative for abdominal pain, nausea and vomiting.  Genitourinary:  Negative for decreased urine volume, dysuria and hematuria.  Musculoskeletal:  Positive for arthralgias.  Skin:  Negative for rash.  Neurological:  Negative for syncope and facial asymmetry.  Psychiatric/Behavioral:  Negative for confusion and hallucinations.   See other pertinent positives and negatives in HPI.  Current Outpatient Medications on File Prior to Visit  Medication Sig Dispense Refill   acetaminophen  (TYLENOL ) 500 MG tablet Take 500 mg by mouth daily.     Ascorbic Acid  (VITAMIN C ) 1000 MG tablet Take 1,000 mg by mouth daily.     BIOTIN  5000 PO Take 5,000 mcg by mouth daily.     cholecalciferol  (VITAMIN D3) 25 MCG (1000 UNIT) tablet Take 1,000 Units by mouth daily.     metoprolol  tartrate  (LOPRESSOR ) 25 MG tablet TAKE 1 TABLET BY MOUTH DAILY. 90 tablet 3   Multiple Vitamins-Minerals (CENTRUM SILVER  ADULT 50+) TABS Take 1 tablet by mouth daily.     Propylene Glycol (SYSTANE BALANCE) 0.6 % SOLN Place 1 drop into both eyes daily as needed (dry eyes).     vitamin B-12 (CYANOCOBALAMIN ) 1000 MCG tablet Take 1,000 mcg by mouth daily.     warfarin (COUMADIN ) 5 MG tablet TAKE 1 AND 1/2 TO 2 TABLETS BY MOUTH DAILY AS DIRECTED BY THE COUMADIN  CLINIC 160 tablet 2   doxycycline  (VIBRA -TABS) 100 MG tablet Take 1 tablet (100 mg total) by mouth 2 (two) times daily. 20 tablet 0   No current facility-administered medications on file prior to visit.    Past Medical History:  Diagnosis Date   ANXIETY 04/07/2007   Breast cancer of upper-outer quadrant of right female breast (HCC) 01/11/2016   COLONIC POLYPS, HX OF 04/02/2007   adenomatous   DIVERTICULITIS, HX OF 04/02/2007   Diverticulosis    DJD (degenerative joint disease)    Hemorrhoids    HYPERTENSION 04/02/2007   OSTEOARTHRITIS, SEVERE 04/07/2007   Paget's disease of bone    lumbar and sacrum   WEIGHT GAIN 11/13/2007   Allergies  Allergen Reactions   Hydrocodone -Acetaminophen  Other (See Comments)    UNK reaction   Tramadol      Loss of control    Social History   Socioeconomic History   Marital status: Married    Spouse name: Not on file   Number of children: 2   Years of education: Not on file   Highest education level: Not on file  Occupational History   Occupation: retired  Tobacco Use   Smoking status: Former    Current packs/day: 0.00    Average packs/day: 1 pack/day for 27.0 years (27.0 ttl pk-yrs)    Types: Cigarettes    Start date: 06/05/1951    Quit date: 06/04/1978    Years since quitting: 45.9   Smokeless tobacco: Never  Vaping Use   Vaping status: Never Used  Substance and Sexual Activity   Alcohol  use: Yes    Alcohol /week: 7.0 standard drinks of alcohol     Types: 7 Glasses of wine per week    Comment: 1-2  glasses wine after dinner, but not necessarily each day   Drug use: No   Sexual activity: Not on file  Other Topics Concern   Not on file  Social History Narrative   Not on file   Social Drivers of Health   Financial Resource Strain: Low Risk  (10/23/2021)   Overall  Financial Resource Strain (CARDIA)    Difficulty of Paying Living Expenses: Not hard at all  Food Insecurity: No Food Insecurity (05/14/2022)   Hunger Vital Sign    Worried About Running Out of Food in the Last Year: Never true    Ran Out of Food in the Last Year: Never true  Transportation Needs: No Transportation Needs (05/14/2022)   PRAPARE - Administrator, Civil Service (Medical): No    Lack of Transportation (Non-Medical): No  Physical Activity: Insufficiently Active (10/23/2021)   Exercise Vital Sign    Days of Exercise per Week: 5 days    Minutes of Exercise per Session: 20 min  Stress: No Stress Concern Present (10/23/2021)   [employer] of Occupational Health - Occupational Stress Questionnaire    Feeling of Stress : Not at all  Social Connections: Moderately Integrated (10/23/2021)   Social Connection and Isolation Panel    Frequency of Communication with Friends and Family: More than three times a week    Frequency of Social Gatherings with Friends and Family: More than three times a week    Attends Religious Services: More than 4 times per year    Active Member of [employer] or Organizations: Yes    Attends Banker Meetings: More than 4 times per year    Marital Status: Widowed   Vitals:   04/17/24 0913  BP: 132/64  Pulse: 70  Resp: 16  Temp: 97.9 F (36.6 C)  SpO2: 95%   Body mass index is 28.53 kg/m.  Physical Exam Vitals and nursing note reviewed.  Constitutional:      General: She is not in acute distress.    Appearance: She is well-developed.  HENT:     Head: Normocephalic and atraumatic.     Mouth/Throat:     Mouth: Mucous membranes are moist.     Pharynx:  Oropharynx is clear.  Eyes:     Conjunctiva/sclera: Conjunctivae normal.  Cardiovascular:     Rate and Rhythm: Normal rate and regular rhythm.     Heart sounds: Murmur (SEM I/VI RUSB) heard.     Comments: PT pulses palpable. Pulmonary:     Effort: Pulmonary effort is normal. No respiratory distress.     Breath sounds: Normal breath sounds.  Abdominal:     Palpations: Abdomen is soft. There is no hepatomegaly or mass.     Tenderness: There is no abdominal tenderness.  Musculoskeletal:     Right lower leg: No edema.     Left lower leg: No edema.  Lymphadenopathy:     Cervical: No cervical adenopathy.  Skin:    General: Skin is warm.     Findings: No erythema or rash.  Neurological:     General: No focal deficit present.     Mental Status: She is alert and oriented to person, place, and time.     Gait: Gait normal.     Comments: Gait assisted with a walker.  Psychiatric:        Mood and Affect: Mood and affect normal.   ASSESSMENT AND PLAN:  Ms. Loda was seen today for medical management of chronic issues.  Diagnoses and all orders for this visit: Orders Placed This Encounter  Procedures   Basic metabolic panel with GFR   Lab Results  Component Value Date   NA 144 04/17/2024   CL 102 04/17/2024   K 4.3 04/17/2024   CO2 33 (H) 04/17/2024   BUN 22 04/17/2024   CREATININE 0.83  04/17/2024   GFR 61.11 04/17/2024   CALCIUM  10.0 04/17/2024   ALBUMIN 3.5 05/07/2022   GLUCOSE 100 (H) 04/17/2024   Chronic obstructive pulmonary disease, unspecified COPD type (HCC) Assessment & Plan: Problem is stable. Continue albuterol  inhaler 1 to 2 puff every 6 hours as needed.  She does not use medication frequently. As far as problem is stable, annual follow-ups can be continued.  Orders: -     Albuterol  Sulfate HFA; Inhale 2 puffs into the lungs every 6 (six) hours as needed for wheezing or shortness of breath. Please call [phone] to schedule follow up appointment.  Dispense:  18 g; Refill: 3  Essential hypertension Assessment & Plan: BP adequately controlled. Continue HCTZ 12.5 mg daily and metoprolol  titrate 25 mg daily. Continue low-salt diet. She monitors blood pressure occasionally. Follows with cardiologist. Follow-up in a year.  Orders: -     hydroCHLOROthiazide ; Take 1 capsule (12.5 mg total) by mouth daily.  Dispense: 90 capsule; Refill: 3 -     Basic metabolic panel with GFR; Future  Anxiety disorder, unspecified type Assessment & Plan: She reports the problem as well-controlled with current regimen. She would like to continue same dose of sertraline . As far as problem is stable, we will continue annual follow-ups.  Orders: -     Sertraline  HCl; Take 1 tablet (50 mg total) by mouth daily.  Dispense: 90 tablet; Refill: 3  Paroxysmal atrial fibrillation (HCC) Assessment & Plan: Rate and rhythm controlled today. Currently on Coumadin  and metoprolol  titrate 25 mg daily. Follows with cardiology and Coumadin  clinic regularly.   Return in about 1 year (around 04/17/2025) for CPE.  Sunil Hue G. Donyelle Enyeart, MD  [facility]. [place] office.

## 2024-04-17 NOTE — Assessment & Plan Note (Signed)
 She reports the problem as well-controlled with current regimen. She would like to continue same dose of sertraline . As far as problem is stable, we will continue annual follow-ups.

## 2024-04-17 NOTE — Assessment & Plan Note (Signed)
 Problem is stable. Continue albuterol  inhaler 1 to 2 puff every 6 hours as needed.  She does not use medication frequently. As far as problem is stable, annual follow-ups can be continued.

## 2024-04-17 NOTE — Patient Instructions (Addendum)
 A few things to remember from today's visit:  Essential hypertension - Plan: hydrochlorothiazide  (MICROZIDE ) 12.5 MG capsule, Basic metabolic panel with GFR  Chronic obstructive pulmonary disease, unspecified COPD type (HCC) - Plan: albuterol  (VENTOLIN  HFA) 108 (90 Base) MCG/ACT inhaler  Anxiety disorder, unspecified type - Plan: sertraline  (ZOLOFT ) 50 MG tablet  Paroxysmal atrial fibrillation (HCC) No changes today.  If you need refills for medications you take chronically, please call your pharmacy. Do not use My Chart to request refills or for acute issues that need immediate attention. If you send a my chart message, it may take a few days to be addressed, specially if I am not in the office.  Please be sure medication list is accurate. If a new problem present, please set up appointment sooner than planned today.

## 2024-04-17 NOTE — Assessment & Plan Note (Deleted)
 Rate and rhythm controlled today. Currently on Coumadin  and metoprolol  titrate 25 mg daily. Follows with cardiology and Coumadin  clinic regularly.

## 2024-04-17 NOTE — Assessment & Plan Note (Signed)
 Rate and rhythm controlled today. Currently on Coumadin  and metoprolol  titrate 25 mg daily. Follows with cardiology and Coumadin  clinic regularly.

## 2024-04-17 NOTE — Assessment & Plan Note (Signed)
 BP adequately controlled. Continue HCTZ 12.5 mg daily and metoprolol  titrate 25 mg daily. Continue low-salt diet. She monitors blood pressure occasionally. Follows with cardiologist. Follow-up in a year.

## 2024-04-18 ENCOUNTER — Ambulatory Visit: Payer: Self-pay | Admitting: Family Medicine

## 2024-04-22 NOTE — Telephone Encounter (Signed)
 Copied from CRM [id number]. Topic: Clinical - Lab/Test Results >> Apr 22, 2024  1:17 PM Teresa Frey wrote: Reason for CRM: Patient return call and has been made aware of lab results per chart notations.

## 2024-04-24 ENCOUNTER — Ambulatory Visit: Attending: Cardiology | Admitting: *Deleted

## 2024-04-24 DIAGNOSIS — I480 Paroxysmal atrial fibrillation: Secondary | ICD-10-CM | POA: Diagnosis not present

## 2024-04-24 DIAGNOSIS — I4891 Unspecified atrial fibrillation: Secondary | ICD-10-CM

## 2024-04-24 DIAGNOSIS — Z7901 Long term (current) use of anticoagulants: Secondary | ICD-10-CM | POA: Diagnosis not present

## 2024-04-24 LAB — POCT INR: INR: 2.3 (ref 2.0–3.0)

## 2024-04-24 NOTE — Progress Notes (Signed)
 Description   INR-2.3; Continue taking Warfarin 1.5 tablets daily except 1 tablet of Mondays.  Repeat INR in 4 weeks.  Coumadin  Clinic [phone]

## 2024-04-24 NOTE — Patient Instructions (Signed)
 Description   INR-2.3; Continue taking Warfarin 1.5 tablets daily except 1 tablet of Mondays.  Repeat INR in 4 weeks.  Coumadin  Clinic [phone]

## 2024-05-01 ENCOUNTER — Other Ambulatory Visit: Payer: Self-pay | Admitting: Cardiology

## 2024-05-04 ENCOUNTER — Telehealth: Payer: Self-pay | Admitting: Cardiology

## 2024-05-04 NOTE — Telephone Encounter (Signed)
*  STAT* If patient is at the pharmacy, call can be transferred to refill team.   1. Which medications need to be refilled? (please list name of each medication and dose if known) metoprolol  tartrate (LOPRESSOR ) 25 MG tablet    2. Would you like to learn more about the convenience, safety, & potential cost savings by using the [facility]? No   3. Are you open to using the [facility] (Type [facility].) No   4. Which pharmacy/location (including street and city if local pharmacy) is medication to be sent to?  [facility] - [place], [place] - [address]   5. Do they need a 30 day or 90 day supply? 90 day  Pt is out of medication and has scheduled appt on 06/22/24

## 2024-05-05 MED ORDER — METOPROLOL TARTRATE 25 MG PO TABS: 25.0000 mg | ORAL_TABLET | Freq: Every day | ORAL | 0 refills | Status: AC

## 2024-05-05 NOTE — Telephone Encounter (Signed)
 Someone has already sent this refill in.

## 2024-05-22 ENCOUNTER — Ambulatory Visit: Attending: Cardiology | Admitting: Pharmacist

## 2024-05-22 DIAGNOSIS — Z7901 Long term (current) use of anticoagulants: Secondary | ICD-10-CM

## 2024-05-22 DIAGNOSIS — I480 Paroxysmal atrial fibrillation: Secondary | ICD-10-CM | POA: Diagnosis not present

## 2024-05-22 LAB — POCT INR: INR: 2.2 (ref 2.0–3.0)

## 2024-05-22 NOTE — Patient Instructions (Signed)
 Description   INR-2.2; Continue taking Warfarin 1.5 tablets daily except 1 tablet of Mondays.  Repeat INR in 5 weeks.  Coumadin  Clinic [phone]

## 2024-05-22 NOTE — Progress Notes (Signed)
 Description   INR-2.2; Continue taking Warfarin 1.5 tablets daily except 1 tablet of Mondays.  Repeat INR in 5 weeks.  Coumadin  Clinic [phone]

## 2024-06-20 DIAGNOSIS — I350 Nonrheumatic aortic (valve) stenosis: Secondary | ICD-10-CM | POA: Insufficient documentation

## 2024-06-20 NOTE — Progress Notes (Unsigned)
 " Cardiology Office Note:   Date:  06/22/2024  ID:  Teresa Frey, DOB 11/26/1929, MRN [id number] PCP: Jordan, [name], MD  [facility] Providers Cardiologist:  Lynwood Schilling, MD {  History of Present Illness:   Teresa Frey is a 89 y.o. female who presents for follow up of atrial fib and mild AS.  Since I saw her she has had no new problems.  She gets around slowly because of knee pain.  She uses a walker at the house.  Today she is using a wheelchair because of the distance she would have had to walk.  She is not volunteering any new symptoms.  She has not been having any new shortness of breath, PND or orthopnea.  She has had no new palpitations, presyncope or syncope.  When prompted she tells me she had some dark stool and some bright red blood a few days ago.  She started taking Protonix .  In 2023 she had some bleeding from esophageal ulcer and had to have her Coumadin  reversed and a bleeder cauterized.  She has not had problems with it since then.  ROS: As stated in the HPI and negative for all other systems.  Studies Reviewed:    EKG:   EKG Interpretation Date/Time:  Monday June 22 2024 11:18:29 EST Ventricular Rate:  65 PR Interval:  188 QRS Duration:  92 QT Interval:  404 QTC Calculation: 420 R Axis:   -41  Text Interpretation: Normal sinus rhythm Left axis deviation Minimal voltage criteria for LVH, may be normal variant ( R in aVL ) No significant change since last tracing Confirmed by Schilling Rattan ([id number]) on 06/22/2024 11:32:20 AM    Risk Assessment/Calculations:    CHA2DS2-VASc Score = 4   This indicates a 4.8% annual risk of stroke. The patient's score is based upon: CHF History: 0 HTN History: 1 Diabetes History: 0 Stroke History: 0 Vascular Disease History: 0 Age Score: 2 Gender Score: 1  Physical Exam:   VS:  BP 115/66 (BP Location: Left Arm, Patient Position: Sitting)   Pulse 65   Ht 5' 4 (1.626 m)   Wt 149 lb (67.6 kg)   SpO2 93%   BMI  25.58 kg/m    Wt Readings from Last 3 Encounters:  06/22/24 149 lb (67.6 kg)  04/17/24 156 lb (70.8 kg)  05/09/23 162 lb (73.5 kg)     GEN: Well nourished, well developed in no acute distress NECK: No JVD; No carotid bruits, brisk carotid upstrokes CARDIAC: RRR, 2 out of 6 apical systolic murmur radiating slightly at the aortic outflow tract, no diastolic murmurs, rubs, gallops RESPIRATORY:  Clear to auscultation without rales, wheezing or rhonchi  ABDOMEN: Soft, non-tender, non-distended EXTREMITIES:  No edema; No deformity   ASSESSMENT AND PLAN:   Atrial Fib:  [name] has a CHA2DS2 - VASc score of 4.  She is not noticing any symptomatic paroxysms.  I am going to check a CBC since she has had some dark stools and red blood per rectum.    This is mild and seems to be mild by exam.  I am going to follow this and manage this clinically.  No further imaging.  Murmur: She had mild AS on echo in 2019.    HTN: The blood pressure is at target.  No change in therapy.     Follow up with me in 1 year or sooner if there are any issues.  Signed, Lynwood Schilling, MD   "

## 2024-06-22 ENCOUNTER — Ambulatory Visit: Admitting: Cardiology

## 2024-06-22 ENCOUNTER — Ambulatory Visit: Admitting: *Deleted

## 2024-06-22 ENCOUNTER — Encounter: Payer: Self-pay | Admitting: Cardiology

## 2024-06-22 VITALS — BP 115/66 | HR 65 | Ht 64.0 in | Wt 149.0 lb

## 2024-06-22 DIAGNOSIS — I480 Paroxysmal atrial fibrillation: Secondary | ICD-10-CM | POA: Diagnosis not present

## 2024-06-22 DIAGNOSIS — I350 Nonrheumatic aortic (valve) stenosis: Secondary | ICD-10-CM | POA: Diagnosis not present

## 2024-06-22 DIAGNOSIS — I10 Essential (primary) hypertension: Secondary | ICD-10-CM | POA: Diagnosis not present

## 2024-06-22 DIAGNOSIS — I4891 Unspecified atrial fibrillation: Secondary | ICD-10-CM | POA: Diagnosis not present

## 2024-06-22 DIAGNOSIS — Z7901 Long term (current) use of anticoagulants: Secondary | ICD-10-CM

## 2024-06-22 LAB — CBC
Hematocrit: 43.4 % (ref 34.0–46.6)
Hemoglobin: 14.1 g/dL (ref 11.1–15.9)
MCH: 29.9 pg (ref 26.6–33.0)
MCHC: 32.5 g/dL (ref 31.5–35.7)
MCV: 92 fL (ref 79–97)
Platelets: 211 x10E3/uL (ref 150–450)
RBC: 4.71 x10E6/uL (ref 3.77–5.28)
RDW: 12.4 % (ref 11.7–15.4)
WBC: 8.5 x10E3/uL (ref 3.4–10.8)

## 2024-06-22 LAB — POCT INR: POC INR: 2.7

## 2024-06-22 NOTE — Progress Notes (Signed)
 Lab Results  Component Value Date   INR 2.7 06/22/2024   INR 2.2 05/22/2024   INR 2.3 04/24/2024    Description   INR-2.7; Continue taking Warfarin 1.5 tablets daily except 1 tablet of Mondays.  Repeat INR in 6 weeks.  Coumadin  Clinic [phone]

## 2024-06-22 NOTE — Patient Instructions (Signed)
 Description   INR-2.7; Continue taking Warfarin 1.5 tablets daily except 1 tablet of Mondays.  Repeat INR in 6 weeks.  Coumadin  Clinic [phone]

## 2024-06-22 NOTE — Patient Instructions (Signed)
 Medication Instructions:  Your physician recommends that you continue on your current medications as directed. Please refer to the Current Medication list given to you today.  *If you need a refill on your cardiac medications before your next appointment, please call your pharmacy*  Lab Work: CBC today at [facility] If you have labs (blood work) drawn today and your tests are completely normal, you will receive your results only by: MyChart Message (if you have MyChart) OR A paper copy in the mail If you have any lab test that is abnormal or we need to change your treatment, we will call you to review the results.  Testing/Procedures: NONE  Follow-Up: At [facility], you and your health needs are our priority.  As part of our continuing mission to provide you with exceptional heart care, our providers are all part of one team.  This team includes your primary Cardiologist (physician) and Advanced Practice Providers or APPs (Physician Assistants and Nurse Practitioners) who all work together to provide you with the care you need, when you need it.  Your next appointment:   1 year(s)  Provider:   Lynwood Schilling, MD    We recommend signing up for the patient portal called MyChart.  Sign up information is provided on this After Visit Summary.  MyChart is used to connect with patients for Virtual Visits (Telemedicine).  Patients are able to view lab/test results, encounter notes, upcoming appointments, etc.  Non-urgent messages can be sent to your provider as well.   To learn more about what you can do with MyChart, go to [email or web address].

## 2024-06-23 ENCOUNTER — Ambulatory Visit: Payer: Self-pay | Admitting: Cardiology

## 2024-08-06 ENCOUNTER — Ambulatory Visit
